# Patient Record
Sex: Male | Born: 1955
Health system: Southern US, Community
[De-identification: ages and names within clinical notes are randomized; demographics above are authoritative.]

## PROBLEM LIST (undated history)

## (undated) DIAGNOSIS — N189 Chronic kidney disease, unspecified: Secondary | ICD-10-CM

## (undated) DIAGNOSIS — E785 Hyperlipidemia, unspecified: Secondary | ICD-10-CM

## (undated) DIAGNOSIS — I10 Essential (primary) hypertension: Secondary | ICD-10-CM

## (undated) DIAGNOSIS — Z992 Dependence on renal dialysis: Secondary | ICD-10-CM

## (undated) DIAGNOSIS — I509 Heart failure, unspecified: Secondary | ICD-10-CM

## (undated) DIAGNOSIS — R931 Abnormal findings on diagnostic imaging of heart and coronary circulation: Secondary | ICD-10-CM

## (undated) DIAGNOSIS — F419 Anxiety disorder, unspecified: Secondary | ICD-10-CM

## (undated) DIAGNOSIS — Z8614 Personal history of Methicillin resistant Staphylococcus aureus infection: Secondary | ICD-10-CM

## (undated) DIAGNOSIS — T4145XA Adverse effect of unspecified anesthetic, initial encounter: Secondary | ICD-10-CM

## (undated) DIAGNOSIS — I340 Nonrheumatic mitral (valve) insufficiency: Secondary | ICD-10-CM

## (undated) DIAGNOSIS — T7840XA Allergy, unspecified, initial encounter: Secondary | ICD-10-CM

## (undated) DIAGNOSIS — F32A Depression, unspecified: Secondary | ICD-10-CM

## (undated) DIAGNOSIS — E1121 Type 2 diabetes mellitus with diabetic nephropathy: Secondary | ICD-10-CM

## (undated) DIAGNOSIS — F329 Major depressive disorder, single episode, unspecified: Secondary | ICD-10-CM

## (undated) DIAGNOSIS — M1990 Unspecified osteoarthritis, unspecified site: Secondary | ICD-10-CM

## (undated) DIAGNOSIS — T8859XA Other complications of anesthesia, initial encounter: Secondary | ICD-10-CM

## (undated) DIAGNOSIS — M109 Gout, unspecified: Secondary | ICD-10-CM

## (undated) DIAGNOSIS — N529 Male erectile dysfunction, unspecified: Secondary | ICD-10-CM

## (undated) DIAGNOSIS — J45909 Unspecified asthma, uncomplicated: Secondary | ICD-10-CM

## (undated) DIAGNOSIS — Z972 Presence of dental prosthetic device (complete) (partial): Secondary | ICD-10-CM

## (undated) DIAGNOSIS — IMO0001 Reserved for inherently not codable concepts without codable children: Secondary | ICD-10-CM

## (undated) DIAGNOSIS — D649 Anemia, unspecified: Secondary | ICD-10-CM

## (undated) HISTORY — DX: Male erectile dysfunction, unspecified: N52.9

## (undated) HISTORY — DX: Hyperlipidemia, unspecified: E78.5

## (undated) HISTORY — DX: Reserved for inherently not codable concepts without codable children: IMO0001

## (undated) HISTORY — DX: Essential (primary) hypertension: I10

## (undated) HISTORY — DX: Heart failure, unspecified: I50.9

## (undated) HISTORY — DX: Allergy, unspecified, initial encounter: T78.40XA

## (undated) HISTORY — PX: EYE SURGERY: SHX253

## (undated) HISTORY — PX: HERNIA REPAIR: SHX51

## (undated) HISTORY — PX: APPENDECTOMY: SHX54

---

## 2005-10-22 LAB — HM COLONOSCOPY

## 2006-10-18 ENCOUNTER — Encounter: Payer: Self-pay | Admitting: Internal Medicine

## 2007-06-20 ENCOUNTER — Ambulatory Visit: Payer: Self-pay | Admitting: Internal Medicine

## 2007-06-20 DIAGNOSIS — J309 Allergic rhinitis, unspecified: Secondary | ICD-10-CM | POA: Insufficient documentation

## 2007-06-20 DIAGNOSIS — I10 Essential (primary) hypertension: Secondary | ICD-10-CM | POA: Insufficient documentation

## 2007-06-20 DIAGNOSIS — G43009 Migraine without aura, not intractable, without status migrainosus: Secondary | ICD-10-CM | POA: Insufficient documentation

## 2007-06-20 DIAGNOSIS — E785 Hyperlipidemia, unspecified: Secondary | ICD-10-CM | POA: Insufficient documentation

## 2007-06-24 LAB — CONVERTED CEMR LAB
ALT: 24 units/L (ref 0–53)
Albumin: 3.3 g/dL — ABNORMAL LOW (ref 3.5–5.2)
BUN: 26 mg/dL — ABNORMAL HIGH (ref 6–23)
Creatinine,U: 138.5 mg/dL
Eosinophils Absolute: 0.1 10*3/uL (ref 0.0–0.6)
GFR calc non Af Amer: 67 mL/min
HDL: 45.4 mg/dL (ref >39.0)
Lymphocytes Relative: 36.1 % (ref 12.0–46.0)
MCV: 84 fL (ref 78.0–100.0)
Monocytes Relative: 12.7 % — ABNORMAL HIGH (ref 3.0–11.0)
Neutro Abs: 3.3 10*3/uL (ref 1.4–7.7)
Phosphorus: 4.3 mg/dL (ref 2.3–4.6)
Platelets: 161 10*3/uL (ref 150–400)
Potassium: 4.2 meq/L (ref 3.5–5.1)
Triglycerides: 75 mg/dL (ref 0–149)

## 2007-06-26 ENCOUNTER — Telehealth (INDEPENDENT_AMBULATORY_CARE_PROVIDER_SITE_OTHER): Payer: Self-pay | Admitting: *Deleted

## 2007-06-27 ENCOUNTER — Telehealth (INDEPENDENT_AMBULATORY_CARE_PROVIDER_SITE_OTHER): Payer: Self-pay | Admitting: *Deleted

## 2007-07-03 ENCOUNTER — Encounter: Payer: Self-pay | Admitting: Internal Medicine

## 2007-07-07 ENCOUNTER — Ambulatory Visit: Payer: Self-pay | Admitting: Internal Medicine

## 2007-07-07 DIAGNOSIS — S335XXA Sprain of ligaments of lumbar spine, initial encounter: Secondary | ICD-10-CM | POA: Insufficient documentation

## 2007-07-24 ENCOUNTER — Ambulatory Visit: Payer: Self-pay | Admitting: Internal Medicine

## 2007-09-29 ENCOUNTER — Ambulatory Visit: Payer: Self-pay | Admitting: Internal Medicine

## 2007-09-29 DIAGNOSIS — E1129 Type 2 diabetes mellitus with other diabetic kidney complication: Secondary | ICD-10-CM | POA: Insufficient documentation

## 2007-09-30 LAB — CONVERTED CEMR LAB
CO2: 25 meq/L (ref 19–32)
Chloride: 99 meq/L (ref 96–112)
GFR calc Af Amer: 74 mL/min
Glucose, Bld: 153 mg/dL — ABNORMAL HIGH (ref 70–99)
HDL: 45.9 mg/dL (ref >39.0)
Sodium: 133 meq/L — ABNORMAL LOW (ref 135–145)
Triglycerides: 115 mg/dL (ref 0–149)
VLDL: 23 mg/dL (ref 0–40)

## 2007-10-01 ENCOUNTER — Telehealth: Payer: Self-pay | Admitting: Internal Medicine

## 2008-01-28 ENCOUNTER — Ambulatory Visit: Payer: Self-pay | Admitting: Internal Medicine

## 2008-01-29 LAB — CONVERTED CEMR LAB: Hgb A1c MFr Bld: 9.1 % — ABNORMAL HIGH (ref 4.6–6.0)

## 2008-02-09 ENCOUNTER — Ambulatory Visit: Payer: Self-pay | Admitting: Internal Medicine

## 2008-02-09 DIAGNOSIS — N529 Male erectile dysfunction, unspecified: Secondary | ICD-10-CM | POA: Insufficient documentation

## 2008-02-09 LAB — CONVERTED CEMR LAB
Blood in Urine, dipstick: NEGATIVE
Glucose, Urine, Semiquant: NEGATIVE
Ketones, urine, test strip: NEGATIVE
Urobilinogen, UA: 0.2
WBC Urine, dipstick: NEGATIVE

## 2008-02-10 LAB — CONVERTED CEMR LAB
ALT: 27 units/L (ref 0–53)
AST: 21 units/L (ref 0–37)
Albumin: 3.3 g/dL — ABNORMAL LOW (ref 3.5–5.2)
Alkaline Phosphatase: 55 units/L (ref 39–117)
Basophils Relative: 0.1 % (ref 0.0–1.0)
Calcium: 9.3 mg/dL (ref 8.4–10.5)
Creatinine, Ser: 1.2 mg/dL (ref 0.4–1.5)
Eosinophils Relative: 2.7 % (ref 0.0–5.0)
GFR calc non Af Amer: 67 mL/min
Hemoglobin: 12.8 g/dL — ABNORMAL LOW (ref 13.0–17.0)
Lymphocytes Relative: 34.6 % (ref 12.0–46.0)
Monocytes Relative: 10.9 % (ref 3.0–12.0)
Neutro Abs: 2.6 10*3/uL (ref 1.4–7.7)
Neutrophils Relative %: 51.7 % (ref 43.0–77.0)
Phosphorus: 3.7 mg/dL (ref 2.3–4.6)
Prolactin: 4.9 ng/mL
RBC: 4.56 M/uL (ref 4.22–5.81)
Sex Hormone Binding: 15 nmol/L (ref 13–71)
Sodium: 142 meq/L (ref 135–145)
Testosterone-% Free: 2.8 % (ref 1.6–2.9)
Testosterone: 213.24 ng/dL — ABNORMAL LOW (ref 350–890)
WBC: 5.1 10*3/uL (ref 4.5–10.5)

## 2008-04-30 ENCOUNTER — Encounter (INDEPENDENT_AMBULATORY_CARE_PROVIDER_SITE_OTHER): Payer: Self-pay | Admitting: *Deleted

## 2008-04-30 ENCOUNTER — Ambulatory Visit: Payer: Self-pay | Admitting: Family Medicine

## 2008-04-30 DIAGNOSIS — R42 Dizziness and giddiness: Secondary | ICD-10-CM | POA: Insufficient documentation

## 2008-04-30 LAB — CONVERTED CEMR LAB
BUN: 24 mg/dL — ABNORMAL HIGH (ref 6–23)
Blood Glucose, Fingerstick: 173
CO2: 28 meq/L (ref 19–32)
Calcium: 9.3 mg/dL (ref 8.4–10.5)
Chloride: 106 meq/L (ref 96–112)
Eosinophils Relative: 2 % (ref 0.0–5.0)
GFR calc non Af Amer: 61 mL/min
Glucose, Bld: 169 mg/dL — ABNORMAL HIGH (ref 70–99)
HCT: 38.7 % — ABNORMAL LOW (ref 39.0–52.0)
Lymphocytes Relative: 37.6 % (ref 12.0–46.0)
Monocytes Absolute: 0.6 10*3/uL (ref 0.1–1.0)
Monocytes Relative: 9.7 % (ref 3.0–12.0)
Platelets: 212 10*3/uL (ref 150–400)
Potassium: 4.5 meq/L (ref 3.5–5.1)
RDW: 13.3 % (ref 11.5–14.6)
Sodium: 140 meq/L (ref 135–145)
WBC: 6.4 10*3/uL (ref 4.5–10.5)

## 2008-06-16 ENCOUNTER — Ambulatory Visit: Payer: Self-pay | Admitting: Internal Medicine

## 2008-06-17 LAB — CONVERTED CEMR LAB: Hgb A1c MFr Bld: 8.8 % — ABNORMAL HIGH (ref 4.6–6.0)

## 2008-07-26 ENCOUNTER — Ambulatory Visit: Payer: Self-pay | Admitting: Internal Medicine

## 2008-07-28 ENCOUNTER — Ambulatory Visit: Payer: Self-pay | Admitting: Family Medicine

## 2008-07-28 DIAGNOSIS — R195 Other fecal abnormalities: Secondary | ICD-10-CM | POA: Insufficient documentation

## 2008-07-29 LAB — CONVERTED CEMR LAB
Lymphocytes Relative: 35.2 % (ref 12.0–46.0)
Monocytes Relative: 11.8 % (ref 3.0–12.0)
Platelets: 198 10*3/uL (ref 150–400)
RDW: 13 % (ref 11.5–14.6)
TSH: 0.87 microintl units/mL (ref 0.35–5.50)

## 2008-08-18 ENCOUNTER — Ambulatory Visit: Payer: Self-pay | Admitting: Family Medicine

## 2008-12-21 ENCOUNTER — Ambulatory Visit: Payer: Self-pay | Admitting: Internal Medicine

## 2008-12-21 DIAGNOSIS — D649 Anemia, unspecified: Secondary | ICD-10-CM | POA: Insufficient documentation

## 2008-12-22 LAB — CONVERTED CEMR LAB
Albumin: 3.1 g/dL — ABNORMAL LOW (ref 3.5–5.2)
Alkaline Phosphatase: 51 units/L (ref 39–117)
Basophils Absolute: 0 10*3/uL (ref 0.0–0.1)
Basophils Relative: 0.3 % (ref 0.0–3.0)
CO2: 28 meq/L (ref 19–32)
Chloride: 105 meq/L (ref 96–112)
Cholesterol: 153 mg/dL (ref 0–200)
Creatinine, Ser: 1.1 mg/dL (ref 0.4–1.5)
Eosinophils Absolute: 0.1 10*3/uL (ref 0.0–0.7)
GFR calc non Af Amer: 74 mL/min
HDL: 49.1 mg/dL (ref >39.0)
Hgb A1c MFr Bld: 8.4 % — ABNORMAL HIGH (ref 4.6–6.0)
MCHC: 33.2 g/dL (ref 30.0–36.0)
MCV: 86.1 fL (ref 78.0–100.0)
Monocytes Absolute: 0.6 10*3/uL (ref 0.1–1.0)
Neutrophils Relative %: 48.5 % (ref 43.0–77.0)
Platelets: 153 10*3/uL (ref 150–400)
Potassium: 4.7 meq/L (ref 3.5–5.1)
RBC: 4.3 M/uL (ref 4.22–5.81)
VLDL: 17 mg/dL (ref 0–40)

## 2009-06-28 ENCOUNTER — Ambulatory Visit: Payer: Self-pay | Admitting: Internal Medicine

## 2009-06-28 DIAGNOSIS — M549 Dorsalgia, unspecified: Secondary | ICD-10-CM | POA: Insufficient documentation

## 2009-06-29 LAB — CONVERTED CEMR LAB
ALT: 20 units/L (ref 0–53)
Alkaline Phosphatase: 56 units/L (ref 39–117)
BUN: 34 mg/dL — ABNORMAL HIGH (ref 6–23)
Bilirubin, Direct: 0 mg/dL (ref 0.0–0.3)
CO2: 27 meq/L (ref 19–32)
Cholesterol: 140 mg/dL (ref 0–200)
Creatinine, Ser: 1.3 mg/dL (ref 0.4–1.5)
Creatinine,U: 115.2 mg/dL
Eosinophils Absolute: 0.2 10*3/uL (ref 0.0–0.7)
Eosinophils Relative: 2.9 % (ref 0.0–5.0)
Glucose, Bld: 162 mg/dL — ABNORMAL HIGH (ref 70–99)
Hgb A1c MFr Bld: 7 % — ABNORMAL HIGH (ref 4.6–6.5)
Lymphocytes Relative: 34.6 % (ref 12.0–46.0)
MCV: 86.7 fL (ref 78.0–100.0)
Microalb Creat Ratio: 70.3 mg/g — ABNORMAL HIGH (ref 0.0–30.0)
Microalb, Ur: 8.1 mg/dL — ABNORMAL HIGH (ref 0.0–1.9)
Monocytes Absolute: 0.6 10*3/uL (ref 0.1–1.0)
Neutrophils Relative %: 51.3 % (ref 43.0–77.0)
PSA: 0.44 ng/mL (ref 0.10–4.00)
Platelets: 169 10*3/uL (ref 150.0–400.0)
Sodium: 141 meq/L (ref 135–145)
TSH: 1.25 microintl units/mL (ref 0.35–5.50)
Total Protein: 6.7 g/dL (ref 6.0–8.3)
Triglycerides: 80 mg/dL (ref 0.0–149.0)
VLDL: 16 mg/dL (ref 0.0–40.0)
WBC: 5.8 10*3/uL (ref 4.5–10.5)

## 2009-07-18 ENCOUNTER — Telehealth: Payer: Self-pay | Admitting: Internal Medicine

## 2009-08-25 ENCOUNTER — Ambulatory Visit: Payer: Self-pay | Admitting: Internal Medicine

## 2009-09-22 ENCOUNTER — Encounter: Payer: Self-pay | Admitting: Internal Medicine

## 2009-10-05 ENCOUNTER — Ambulatory Visit: Payer: Self-pay | Admitting: Internal Medicine

## 2009-10-05 DIAGNOSIS — M79609 Pain in unspecified limb: Secondary | ICD-10-CM | POA: Insufficient documentation

## 2009-10-18 LAB — CONVERTED CEMR LAB
BUN: 18 mg/dL (ref 6–23)
Creatinine, Ser: 1.1 mg/dL (ref 0.4–1.5)
GFR calc non Af Amer: 89.14 mL/min (ref >60)
Phosphorus: 4.4 mg/dL (ref 2.3–4.6)
Sodium: 140 meq/L (ref 135–145)

## 2009-11-08 ENCOUNTER — Encounter: Payer: Self-pay | Admitting: Internal Medicine

## 2009-11-09 ENCOUNTER — Ambulatory Visit: Payer: Self-pay | Admitting: Internal Medicine

## 2009-11-10 LAB — CONVERTED CEMR LAB
Albumin: 2.9 g/dL — ABNORMAL LOW (ref 3.5–5.2)
Alkaline Phosphatase: 53 units/L (ref 39–117)
BUN: 16 mg/dL (ref 6–23)
Basophils Absolute: 0.1 10*3/uL (ref 0.0–0.1)
Creatinine, Ser: 1.2 mg/dL (ref 0.4–1.5)
Eosinophils Absolute: 0.1 10*3/uL (ref 0.0–0.7)
Glucose, Bld: 89 mg/dL (ref 70–99)
Lymphocytes Relative: 33.4 % (ref 12.0–46.0)
MCHC: 32 g/dL (ref 30.0–36.0)
Neutro Abs: 3 10*3/uL (ref 1.4–7.7)
Neutrophils Relative %: 53.6 % (ref 43.0–77.0)
Phosphorus: 4 mg/dL (ref 2.3–4.6)
Platelets: 156 10*3/uL (ref 150.0–400.0)
RDW: 14 % (ref 11.5–14.6)
TSH: 1.07 microintl units/mL (ref 0.35–5.50)

## 2009-11-11 ENCOUNTER — Telehealth: Payer: Self-pay | Admitting: Internal Medicine

## 2009-11-18 ENCOUNTER — Encounter: Payer: Self-pay | Admitting: Internal Medicine

## 2009-11-18 ENCOUNTER — Ambulatory Visit: Payer: Self-pay

## 2009-11-18 ENCOUNTER — Ambulatory Visit (HOSPITAL_COMMUNITY): Admission: RE | Admit: 2009-11-18 | Discharge: 2009-11-18 | Payer: Self-pay | Admitting: Internal Medicine

## 2009-11-18 ENCOUNTER — Ambulatory Visit: Payer: Self-pay | Admitting: Internal Medicine

## 2009-11-18 DIAGNOSIS — I5032 Chronic diastolic (congestive) heart failure: Secondary | ICD-10-CM | POA: Insufficient documentation

## 2009-11-24 ENCOUNTER — Ambulatory Visit: Payer: Self-pay | Admitting: Internal Medicine

## 2009-11-28 LAB — CONVERTED CEMR LAB
Albumin: 3.4 g/dL — ABNORMAL LOW (ref 3.5–5.2)
Chloride: 108 meq/L (ref 96–112)
GFR calc non Af Amer: 67.45 mL/min (ref >60)
Phosphorus: 4.4 mg/dL (ref 2.3–4.6)
Potassium: 4.2 meq/L (ref 3.5–5.1)

## 2009-12-06 ENCOUNTER — Ambulatory Visit: Payer: Self-pay | Admitting: Cardiology

## 2009-12-06 ENCOUNTER — Ambulatory Visit: Payer: Self-pay

## 2009-12-06 ENCOUNTER — Ambulatory Visit (HOSPITAL_COMMUNITY): Admission: RE | Admit: 2009-12-06 | Discharge: 2009-12-06 | Payer: Self-pay | Admitting: Internal Medicine

## 2009-12-06 ENCOUNTER — Encounter: Payer: Self-pay | Admitting: Internal Medicine

## 2010-01-06 ENCOUNTER — Ambulatory Visit: Payer: Self-pay | Admitting: Internal Medicine

## 2010-01-06 DIAGNOSIS — M109 Gout, unspecified: Secondary | ICD-10-CM | POA: Insufficient documentation

## 2010-01-09 LAB — CONVERTED CEMR LAB
ALT: 19 units/L (ref 0–53)
AST: 20 units/L (ref 0–37)
CO2: 28 meq/L (ref 19–32)
Cholesterol: 145 mg/dL (ref 0–200)
Creatinine, Ser: 1.1 mg/dL (ref 0.4–1.5)
Eosinophils Relative: 3.3 % (ref 0.0–5.0)
GFR calc non Af Amer: 89.05 mL/min (ref >60)
Glucose, Bld: 88 mg/dL (ref 70–99)
HDL: 63.4 mg/dL (ref >39.00)
Hgb A1c MFr Bld: 6.9 % — ABNORMAL HIGH (ref 4.6–6.5)
Lymphocytes Relative: 34.6 % (ref 12.0–46.0)
Monocytes Relative: 11.4 % (ref 3.0–12.0)
Neutrophils Relative %: 50.2 % (ref 43.0–77.0)
Phosphorus: 4 mg/dL (ref 2.3–4.6)
Platelets: 178 10*3/uL (ref 150.0–400.0)
Sodium: 140 meq/L (ref 135–145)
TSH: 0.88 microintl units/mL (ref 0.35–5.50)
Total Protein: 6.8 g/dL (ref 6.0–8.3)
VLDL: 20.8 mg/dL (ref 0.0–40.0)
WBC: 5.1 10*3/uL (ref 4.5–10.5)

## 2010-01-24 ENCOUNTER — Ambulatory Visit: Payer: Self-pay | Admitting: Internal Medicine

## 2010-01-24 LAB — CONVERTED CEMR LAB
Bilirubin Urine: NEGATIVE
Ketones, urine, test strip: NEGATIVE
Nitrite: NEGATIVE
Specific Gravity, Urine: 1.015
pH: 6

## 2010-02-28 ENCOUNTER — Encounter (INDEPENDENT_AMBULATORY_CARE_PROVIDER_SITE_OTHER): Payer: Self-pay | Admitting: *Deleted

## 2010-05-16 ENCOUNTER — Telehealth: Payer: Self-pay | Admitting: Internal Medicine

## 2010-06-19 ENCOUNTER — Ambulatory Visit: Payer: Self-pay | Admitting: Internal Medicine

## 2010-06-20 LAB — CONVERTED CEMR LAB
AST: 19 units/L (ref 0–37)
Albumin: 3.4 g/dL — ABNORMAL LOW (ref 3.5–5.2)
Alkaline Phosphatase: 57 units/L (ref 39–117)
Bilirubin, Direct: 0.1 mg/dL (ref 0.0–0.3)
Cholesterol: 173 mg/dL (ref 0–200)
Total CHOL/HDL Ratio: 3
Triglycerides: 71 mg/dL (ref 0.0–149.0)

## 2010-07-03 ENCOUNTER — Ambulatory Visit: Payer: Self-pay | Admitting: Internal Medicine

## 2010-07-05 LAB — CONVERTED CEMR LAB
Albumin: 3.3 g/dL — ABNORMAL LOW (ref 3.5–5.2)
BUN: 28 mg/dL — ABNORMAL HIGH (ref 6–23)
CO2: 26 meq/L (ref 19–32)
Calcium: 9 mg/dL (ref 8.4–10.5)
Chloride: 110 meq/L (ref 96–112)
Creatinine, Ser: 1.5 mg/dL (ref 0.4–1.5)
GFR calc non Af Amer: 64.63 mL/min (ref >60)

## 2010-11-17 ENCOUNTER — Ambulatory Visit
Admission: RE | Admit: 2010-11-17 | Discharge: 2010-11-17 | Payer: Self-pay | Source: Home / Self Care | Attending: Internal Medicine | Admitting: Internal Medicine

## 2010-11-17 DIAGNOSIS — M25569 Pain in unspecified knee: Secondary | ICD-10-CM | POA: Insufficient documentation

## 2010-11-22 NOTE — Progress Notes (Signed)
Summary: regarding a possible drug interaction  Phone Note From Pharmacy   Caller: [place] [phone] Summary of Call: Pharmacy called regarding a possible drug interaction between simvastatin and amlodipine.  Please advise. Initial call taken by: Marty Heck CMA,  May 16, 2010 11:04 AM  Follow-up for Phone Call        Yes  Please have him stop the simvastatin and change him to pravastatin 40mg  daily. Rx x 1 year Please call him and let him know there is a potential interaction between these 2 so I am changing my patients to another cholesterol med  Have him come in for blood work in 4-6 weeks (lipid and hepatic) Follow-up by: Claris Gower MD,  May 16, 2010 1:43 PM  Additional Follow-up for Phone Call Additional follow up Details #1::        Patient notified as instructed via telephone.  Rx sent in for Pravastatin 40 to [facility].  Lab appt made. Additional Follow-up by: Sherrian Divers CMA Deborra Medina),  May 16, 2010 2:46 PM    New/Updated Medications: PRAVASTATIN SODIUM 40 MG TABS (PRAVASTATIN SODIUM) take one tablet by mouth daily Prescriptions: PRAVASTATIN SODIUM 40 MG TABS (PRAVASTATIN SODIUM) take one tablet by mouth daily  #30 x 11   Entered by:   Sherrian Divers CMA ([place])   Authorized by:   Claris Gower MD   Signed by:   Sherrian Divers CMA ([place]) on 05/16/2010   Method used:   Electronically to        [facility] [phone]* (retail)       [address]       [place],   [address]       Ph: [phone] or [phone]       Fax: [phone]   RxIDAR:[id number]   Current Allergies (reviewed today): ! ASA ! * KIDNEY DYE

## 2010-11-22 NOTE — Miscellaneous (Signed)
Summary: med list update- colcrys  Clinical Lists Changes  Medications: Changed medication from COLCHICINE 0.6 MG TABS (COLCHICINE) 1 up to three times a day as needed for foot pain from gout to COLCRYS 0.6 MG TABS (COLCHICINE) take one up to three times a day for pain from gout     Prior Medications: FEXOFENADINE HCL 180 MG  TABS (FEXOFENADINE HCL) Take 1 tablet by mouth once a day GLYBURIDE 5 MG  TABS (GLYBURIDE) Take 1 tablet by mouth two times a day RHINOCORT AQUA 32 MCG/ACT  SUSP (BUDESONIDE (NASAL)) 1 spray ea. nostril daily AMLODIPINE BESY-BENAZEPRIL HCL 10-20 MG  CAPS (AMLODIPINE BESY-BENAZEPRIL HCL) Take 1 tablet by mouth once a day ACTOS 45 MG  TABS (PIOGLITAZONE HCL) 1 daily----- Is now off avandia SIMVASTATIN 40 MG  TABS (SIMVASTATIN) Take 1 tablet by mouth once a day MENS MULTIVITAMIN PLUS   TABS (MULTIPLE VITAMINS-MINERALS) Take 1 tablet by mouth once a day CARISOPRODOL 350 MG TABS (CARISOPRODOL) 1/2-1 tab by mouth three times a day as needed for muscle spasm LOSARTAN POTASSIUM 100 MG TABS (LOSARTAN POTASSIUM) 1 tab daily METFORMIN HCL 500 MG XR24H-TAB (METFORMIN HCL) 4 tabs in AM FUROSEMIDE 40 MG TABS (FUROSEMIDE) 1 tab  daily as directed to reduce swelling. If increased swelling, use 2 tabs POTASSIUM CHLORIDE 20 MEQ PACK (POTASSIUM CHLORIDE) take 1 by mouth once daily with the furosemide LEVITRA 20 MG TABS (VARDENAFIL HCL) take 1 tab about 30-60 minutes before sex Current Allergies: ! ASA ! * KIDNEY DYE

## 2010-11-22 NOTE — Assessment & Plan Note (Signed)
Summary: 2 WEEK FOLLOW UP/[facility]   Vital Signs:  Patient profile:   55 year old male Weight:      278 pounds Temp:     98.5 degrees F oral Pulse rate:   60 / minute Pulse rhythm:   irregular BP sitting:   160 / 70  (left arm) Cuff size:   large  Vitals Entered By: Edwin Dada CMA Deborra Medina) (November 24, 2009 2:59 PM) CC: 2 week follow-up   History of Present Illness: Feels better now as of this week Swelling is basically gone Not waking up with the tired feeling back to working without a problem No edema even at the end of the day  Has cut out all salt  weight down 13#  Allergies: 1)  ! Asa 2)  ! * Kidney Dye  Past History:  Past medical, surgical, family and social histories (including risk factors) reviewed for relevance to current acute and chronic problems.  Past Medical History: Allergic rhinitis Diabetes mellitus, type II Hyperlipidemia Hypertension Migraine Erectile dysfunction CHF--diastolic  Past Surgical History: Reviewed history from 06/20/2007 and no changes required. LIH  0000000 Umbilical hernia repair [id number]  Family History: Reviewed history from 06/20/2007 and no changes required. Mom died from DM complications in [id number] Dad died of prostate cancer in 84's.  Had brain tumor earlier 02-Jan-2023 siblings--3 have died (2 were homicide, 1 had seizures) DM strong in family  CAD in 1 brother HTN in family, esp Mom's side No colon cancer  Social History: Reviewed history from 06/20/2007 and no changes required. 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   Neck:  supple and no masses.   Lungs:  normal respiratory effort and normal breath sounds.   Heart:  normal rate, regular rhythm, no murmur, and no gallop.   Freq ectopy Extremities:  edema gone   Impression & Recommendations:  Problem # 1:  CHRONIC DIASTOLIC HEART FAILURE (0000000) Assessment New  echo reviewed getting repeat contrast study will decrease furosemide unless edema worse okay to increase graded exercise  His updated medication list for this problem includes:    Amlodipine Besy-benazepril Hcl 10-20 Mg Caps (Amlodipine besy-benazepril hcl) .[name]... Take 1 tablet by mouth once a day    Losartan Potassium 100 Mg Tabs (Losartan potassium) .[name]... 1 tab daily    Furosemide 40 Mg Tabs (Furosemide) .[name]... 1 tab  daily as directed to reduce swelling. if increased swelling, use 2 tabs  Orders: Venipuncture [id number]) TLB-Renal Function Panel (80069-RENAL)  Complete Medication List: 1)  Fexofenadine Hcl 180 Mg Tabs (Fexofenadine hcl) .... Take 1 tablet by mouth once a day 2)  Glyburide 5 Mg Tabs (Glyburide) .... Take 1 tablet by mouth two times a day 3)  Rhinocort Aqua 32 Mcg/act Susp (Budesonide (nasal)) .[name].. 1 spray ea. nostril daily 4)  Amlodipine Besy-benazepril Hcl 10-20 Mg Caps (Amlodipine besy-benazepril hcl) .... Take 1 tablet by mouth once a day 5)  Actos 45 Mg Tabs (Pioglitazone hcl) .[name].. 1 daily----- is now off avandia 6)  Simvastatin 40 Mg Tabs (Simvastatin) .... Take 1 tablet by mouth once a day 7)  Mens Multivitamin Plus Tabs (Multiple vitamins-minerals) .... Take 1 tablet by mouth once a day 8)  Carisoprodol 350 Mg Tabs (Carisoprodol) .Marland KitchenMarland KitchenMarland Kitchen  1/2-1 tab by mouth three times a day as needed for muscle spasm 9)  Colchicine 0.6 Mg Tabs (Colchicine) .[name].. 1 up to three times a day as needed for foot pain from gout 10)  Losartan Potassium 100 Mg Tabs (Losartan potassium) .[name].. 1 tab daily 11)  Metformin Hcl 500 Mg Xr24h-tab (Metformin hcl) .... 4 tabs in am 12)  Furosemide 40 Mg Tabs (Furosemide) .[name].. 1 tab  daily as directed to reduce swelling. if increased swelling, use 2 tabs 13)  Potassium Chloride 20 Meq Pack (Potassium chloride)  .... Take 1 by mouth once daily with the furosemide  Patient Instructions: 1)  Keep appt on March 18th  Current Allergies (reviewed today): ! ASA ! * KIDNEY DYE

## 2010-11-22 NOTE — Assessment & Plan Note (Signed)
Summary: ROA  6 MTHS CYD R/S FROM 12/28/09   Vital Signs:  Patient profile:   55 year old male Weight:      278 pounds Temp:     98.3 degrees F oral Pulse rate:   60 / minute Pulse rhythm:   regular BP sitting:   150 / 60  (left arm) Cuff size:   large  Vitals Entered By: Edwin Dada CMA ([place]) (January 06, 2010 9:30 AM) CC: 6 month follow-up   History of Present Illness: Swelling is still better No problems with the swelling Breathing has been fine  has had an occ flare with his gout keeps under control with daily colchicine  Checks sugars 2-3 times per week Mostly under <120 fasting No hypoglycemic reactions--gets hungry  No chest pain  no palpitations  Allergies: 1)  ! Asa 2)  ! * Kidney Dye  Past History:  Past medical, surgical, family and social histories (including risk factors) reviewed for relevance to current acute and chronic problems.  Past Medical History: Allergic rhinitis Diabetes mellitus, type II Hyperlipidemia Hypertension Migraine Erectile dysfunction CHF--diastolic Gout  Past Surgical History: Reviewed history from 06/20/2007 and no changes required. LIH  0000000 Umbilical hernia repair [id number]  Family History: Reviewed history from 06/20/2007 and no changes required. Mom died from DM complications in [id number] Dad died of prostate cancer in 39's.  Had brain tumor earlier January 19, 2023 siblings--3 have died (2 were homicide, 1 had seizures) DM strong in family  CAD in 1 brother HTN in family, esp Mom's side No colon cancer  Social History: Reviewed history from 06/20/2007 and no changes required. 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   Neck:  supple, no masses, no thyromegaly, no carotid bruits, and no cervical lymphadenopathy.    Lungs:  normal respiratory effort and normal breath sounds.   Heart:  normal rate, regular rhythm, no murmur, and no gallop.   Msk:  no joint tenderness and no joint swelling.   Skin:  no suspicious lesions and no ulcerations.   Psych:  normally interactive, good eye contact, not anxious appearing, and not depressed appearing.    Diabetes Management Exam:    Foot Exam (with socks and/or shoes not present):       Sensory-Pinprick/Light touch:          Left medial foot (L-4): normal          Left dorsal foot (L-5): normal          Left lateral foot (S-1): normal          Right medial foot (L-4): normal          Right dorsal foot (L-5): normal          Right lateral foot (S-1): normal       Inspection:          Left foot: normal          Right foot: normal       Nails:          Left foot: normal          Right foot: normal   Impression & Recommendations:  Problem # 1:  CHRONIC DIASTOLIC HEART FAILURE (0000000) Assessment Unchanged stable now careful with diet now no changes needed  His updated medication list for this problem includes:    Amlodipine Besy-benazepril Hcl 10-20 Mg Caps (Amlodipine besy-benazepril hcl) .[name]... Take 1 tablet by mouth once a day    Losartan Potassium 100 Mg Tabs (Losartan potassium) .[name]... 1 tab daily    Furosemide 40 Mg Tabs (Furosemide) .[name]... 1 tab  daily as directed to reduce swelling. if increased swelling, use 2 tabs  Problem # 2:  DIABETES MELLITUS, TYPE II (ICD-250.00) Assessment: Unchanged  seems to be okay will recheck labs  His updated medication list for this problem includes:    Glyburide 5 Mg Tabs (Glyburide) .[name]... Take 1 tablet by mouth two times a day    Amlodipine Besy-benazepril Hcl 10-20 Mg Caps (Amlodipine besy-benazepril hcl) .[name]... Take 1 tablet by mouth once a day    Actos 45 Mg Tabs (Pioglitazone hcl) .[name]... 1 daily----- is now off avandia    Losartan Potassium 100 Mg Tabs (Losartan potassium) .[name]... 1 tab daily     Metformin Hcl 500 Mg Xr24h-tab (Metformin hcl) .Marland KitchenMarland KitchenMarland KitchenMarland Kitchen 4 tabs in am  Labs Reviewed: Creat: 1.4 (11/24/2009)     Last Eye Exam: Mild non-proliferative diabetic retinopathy.    (09/22/2009) Reviewed HgBA1c results: 7.0 (06/28/2009)  8.4 (12/21/2008)  Orders: TLB-A1C / Hgb A1C (Glycohemoglobin) (83036-A1C)  Problem # 3:  GOUT (ICD-274.9) Assessment: Unchanged  fine on daily prophylaxis consider change to allopurinol due to price of colcrys  His updated medication list for this problem includes:    Colchicine 0.6 Mg Tabs (Colchicine) .[name]... 1 up to three times a day as needed for foot pain from gout  Orders: TLB-Uric Acid, Blood (84550-URIC)  Problem # 4:  HYPERTENSION (ICD-401.9) Assessment: Unchanged  fair control no changes  His updated medication list for this problem includes:    Amlodipine Besy-benazepril Hcl 10-20 Mg Caps (Amlodipine besy-benazepril hcl) .[name]... Take 1 tablet by mouth once a day    Losartan Potassium 100 Mg Tabs (Losartan potassium) .[name]... 1 tab daily    Furosemide 40 Mg Tabs (Furosemide) .[name]... 1 tab  daily as directed to reduce swelling. if increased swelling, use 2 tabs  BP today: 150/60 Prior BP: 160/70 (11/24/2009)  Labs Reviewed: K+: 4.2 (11/24/2009) Creat: : 1.4 (11/24/2009)   Chol: 140 (06/28/2009)   HDL: 57.60 (06/28/2009)   LDL: 66 (06/28/2009)   TG: 80.0 (06/28/2009)  Orders: TLB-Renal Function Panel (80069-RENAL) TLB-CBC Platelet - w/Differential (85025-CBCD) TLB-TSH (Thyroid Stimulating Hormone) (84443-TSH)  Complete Medication List: 1)  Fexofenadine Hcl 180 Mg Tabs (Fexofenadine hcl) .... Take 1 tablet by mouth once a day 2)  Glyburide 5 Mg Tabs (Glyburide) .... Take 1 tablet by mouth two times a day 3)  Rhinocort Aqua 32 Mcg/act Susp (Budesonide (nasal)) .[name].. 1 spray ea. nostril daily 4)  Amlodipine Besy-benazepril Hcl 10-20 Mg Caps (Amlodipine besy-benazepril hcl) .... Take 1 tablet by mouth once a day 5)  Actos 45 Mg Tabs  (Pioglitazone hcl) .[name].. 1 daily----- is now off avandia 6)  Simvastatin 40 Mg Tabs (Simvastatin) .... Take 1 tablet by mouth once a day 7)  Mens Multivitamin Plus Tabs (Multiple vitamins-minerals) .... Take 1 tablet by mouth once a day 8)  Carisoprodol 350 Mg Tabs (Carisoprodol) .... 1/2-1 tab by mouth three times a day as needed for muscle spasm 9)  Colchicine 0.6 Mg Tabs (Colchicine) .[name].. 1 up to three times a day as needed for foot pain from gout 10)  Losartan Potassium 100 Mg Tabs (Losartan potassium) .[name].. 1 tab daily 11)  Metformin Hcl 500 Mg  Xr24h-tab (Metformin hcl) .... 4 tabs in am 12)  Furosemide 40 Mg Tabs (Furosemide) .[name].. 1 tab  daily as directed to reduce swelling. if increased swelling, use 2 tabs 13)  Potassium Chloride 20 Meq Pack (Potassium chloride) .... 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 ASA ! * KIDNEY DYE

## 2010-11-22 NOTE — Assessment & Plan Note (Signed)
Summary: SWOLLEN LEGS/CLE   Vital Signs:  Patient profile:   55 year old male Weight:      291 pounds Temp:     98.7 degrees F oral Pulse rate:   72 / minute Pulse rhythm:   regular BP sitting:   180 / 60  (left arm) Cuff size:   large  Vitals Entered By: Edwin Dada CMA Deborra Medina) (November 09, 2009 10:56 AM) CC: swollen legs   History of Present Illness: Notes both legs are sweilling --all the way up thighs Bad during day but does get better within an hour if he keeps his legs up started about 2 weeks ago  No changes in meds No chest pain Notes some difficulty breathing in bed. Has cough when supine Occ wheezing when in bed  No dizziness or syncope  Allergies: 1)  ! Asa 2)  ! * Kidney Dye  Past History:  Past medical, surgical, family and social histories (including risk factors) reviewed for relevance to current acute and chronic problems.  Past Medical History: Reviewed history from 04/30/2008 and no changes required. Allergic rhinitis Diabetes mellitus, type I Hyperlipidemia Hypertension Migraine Erectile dysfunction  Past Surgical History: Reviewed history from 06/20/2007 and no changes required. LIH  0000000 Umbilical hernia repair [id number]  Family History: Reviewed history from 06/20/2007 and no changes required. Mom died from DM complications in [id number] Dad died of prostate cancer in 1's.  Had brain tumor earlier Jan 16, 2023 siblings--3 have died (2 were homicide, 1 had seizures) DM strong in family  CAD in 1 brother HTN in family, esp Mom's side No colon cancer  Social History: Reviewed history from 06/20/2007 and no changes required. 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 Normal stools inbetween weight is up 6# appetite has been off  Physical Exam  General:  alert.   NAD Neck:  supple, no masses, no thyromegaly, no JVD, and no cervical lymphadenopathy.   Lungs:  normal respiratory effort, no intercostal retractions, and no accessory muscle use.  Dullness noted at bases but clear otherwise Heart:  normal rate, regular rhythm, no murmur, no gallop, and no rub.  Freq extra beats Abdomen:  soft, non-tender, no hepatomegaly, and no splenomegaly.   Pulses:  normal in feet Extremities:  2+ non pitting edema up both calves Psych:  normally interactive, good eye contact, not depressed appearing, and slightly anxious.     Impression & Recommendations:  Problem # 1:  CONGESTIVE HEART FAILURE UNSPECIFIED (ICD-428.0) Assessment New Likely has diastolic dysfunction but not clear cut Nothing to suggest ischemia but certainly possible, esp in diabetic will treat the edema and fluid overload check echo and close follow  up  His updated medication list for this problem includes:    Amlodipine Besy-benazepril Hcl 10-20 Mg Caps (Amlodipine besy-benazepril hcl) .[name]... Take 1 tablet by mouth once a day    Losartan Potassium 100 Mg Tabs (Losartan potassium) .[name]... 1 tab daily    Furosemide 40 Mg Tabs (Furosemide) .[name]... 1-2 tabs daily as directed to reduce swelling  Orders: CXR- 2view (CXR) EKG w/ Interpretation (93000) TLB-Renal Function Panel (80069-RENAL) TLB-CBC Platelet - w/Differential (85025-CBCD) TLB-Hepatic/Liver Function Pnl (80076-HEPATIC) TLB-TSH (Thyroid Stimulating Hormone) (84443-TSH) Venipuncture [id number]) TLB-BNP (B-Natriuretic Peptide) (83880-BNPR) Echo Referral (Echo)  Problem # 2:  HYPERTENSION (ICD-401.9) Assessment: Comment Only on ACEI and ARB will reconsider  this when current situation more clear  His updated medication list for this problem includes:    Amlodipine Besy-benazepril Hcl 10-20 Mg Caps (Amlodipine besy-benazepril hcl) .[name]... Take 1 tablet by mouth once a day    Losartan Potassium 100 Mg Tabs (Losartan potassium) .[name]... 1 tab  daily    Furosemide 40 Mg Tabs (Furosemide) .[name]... 1-2 tabs daily as directed to reduce swelling  Complete Medication List: 1)  Fexofenadine Hcl 180 Mg Tabs (Fexofenadine hcl) .... Take 1 tablet by mouth once a day 2)  Glyburide 5 Mg Tabs (Glyburide) .... Take 1 tablet by mouth two times a day 3)  Rhinocort Aqua 32 Mcg/act Susp (Budesonide (nasal)) .[name].. 1 spray ea. nostril daily 4)  Amlodipine Besy-benazepril Hcl 10-20 Mg Caps (Amlodipine besy-benazepril hcl) .... Take 1 tablet by mouth once a day 5)  Actos 45 Mg Tabs (Pioglitazone hcl) .[name].. 1 daily----- is now off avandia 6)  Simvastatin 40 Mg Tabs (Simvastatin) .... Take 1 tablet by mouth once a day 7)  Mens Multivitamin Plus Tabs (Multiple vitamins-minerals) .... Take 1 tablet by mouth once a day 8)  Carisoprodol 350 Mg Tabs (Carisoprodol) .... 1/2-1 tab by mouth three times a day as needed for muscle spasm 9)  Colchicine 0.6 Mg Tabs (Colchicine) .[name].. 1 up to three times a day as needed for foot pain from gout 10)  Losartan Potassium 100 Mg Tabs (Losartan potassium) .[name].. 1 tab daily 11)  Metformin Hcl 500 Mg Xr24h-tab (Metformin hcl) .... 4 tabs in am 12)  Furosemide 40 Mg Tabs (Furosemide) .[name].. 1-2 tabs daily as directed to reduce swelling  Patient Instructions: 1)  Please start furosemide today--take 1 tab first, then if no response within 2 hours, try 2 tabs and then stay with that dose 2)  Please schedule echocardiogram 3)  Please schedule a follow-up appointment in 2 weeks.  Prescriptions: FUROSEMIDE 40 MG TABS (FUROSEMIDE) 1-2 tabs daily as directed to reduce swelling  #60 x 12   Entered and Authorized by:   Claris Gower MD   Signed by:   Claris Gower MD on 11/09/2009   Method used:   Electronically to        [facility] [phone]* (retail)       [address]       [place], [place]  [address]       Ph: [phone] or [phone]       Fax: [phone]   RxID:    [id number]   Current Allergies (reviewed today): ! ASA ! * KIDNEY DYE   EKG  Procedure date:  11/09/2009  Findings:      sinus @77  Freq PVCs/bigeminy No ischemic changes

## 2010-11-22 NOTE — Assessment & Plan Note (Signed)
Summary: 28 M F/U DLO   Vital Signs:  Patient profile:   55 year old male Weight:      275 pounds BMI:     36.41 Temp:     98.1 degrees F oral Pulse rate:   60 / minute Pulse rhythm:   regular BP sitting:   150 / 68  (left arm) Cuff size:   large  Vitals Entered By: Edwin Dada CMA Deborra Medina) (July 03, 2010 4:03 PM) CC: 6 month follow-up   History of Present Illness: doing okay  No new concerns Has been having pain in left great toe and medial left knee is also tender Takes the colcyrs every day  Still with sporadic back problems gets stiff and painful in buttocks and above hips in back Hard to loosen up after sitting for a while  Work has been especially stressful 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 Discussed eating snack No numbness  or ulcers in feet  No chest pain No SOB no change in exercise tolerance  Allergies: 1)  ! Asa 2)  ! * Kidney Dye  Past History:  Past medical, surgical, family and social histories (including risk factors) reviewed for relevance to current acute and chronic problems.  Past Medical History: Reviewed history from 01/06/2010 and no changes required. Allergic rhinitis Diabetes mellitus, type II Hyperlipidemia Hypertension Migraine Erectile dysfunction CHF--diastolic Gout  Past Surgical History: Reviewed history from 06/20/2007 and no changes required. LIH  0000000 Umbilical hernia repair [id number]  Family History: Reviewed history from 06/20/2007 and no changes required. Mom died from DM complications in [id number] Dad died of prostate cancer in 77's.  Had brain tumor earlier 29-Dec-2022 siblings--3 have died (2 were homicide, 1 had seizures) DM strong in family  CAD in 1 brother HTN in family, esp Mom's side No colon  cancer  Social History: Reviewed history from 06/20/2007 and no changes required. 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   Neck:  supple, no masses, no thyromegaly, no carotid bruits, and no cervical lymphadenopathy.   Lungs:  normal respiratory effort, no intercostal retractions, no accessory muscle use, and normal breath sounds.   Heart:  normal rate, regular rhythm, no murmur, and no gallop.   Abdomen:  soft and non-tender.   Msk:  slight tenderness at left 1st PIP of toe knee is quiet Pulses:  2+ in feet Extremities:  no sig edema Skin:  no suspicious lesions and no ulcerations.   Psych:  normally interactive, good eye contact, not anxious appearing, and not depressed appearing.    Diabetes Management Exam:    Foot Exam (with socks and/or shoes not present):       Sensory-Pinprick/Light touch:          Left medial foot (L-4): normal          Left dorsal foot (L-5): normal          Left lateral foot (S-1): normal  Right medial foot (L-4): normal          Right dorsal foot (L-5): normal          Right lateral foot (S-1): normal       Inspection:          Left foot: normal          Right foot: normal       Nails:          Left foot: normal          Right foot: normal   Impression & Recommendations:  Problem # 1:  DIABETES MELLITUS, TYPE II (ICD-250.00) Assessment Improved  good control and getting low sugar reactions will stop the actos restart at lower dose if >200 regularly  The following medications were removed from the medication list:    Actos 45 Mg Tabs (Pioglitazone hcl) .[name]... 1 daily----- is now off avandia His  updated medication list for this problem includes:    Glyburide 5 Mg Tabs (Glyburide) .[name]... Take 1 tablet by mouth two times a day    Amlodipine Besy-benazepril Hcl 10-20 Mg Caps (Amlodipine besy-benazepril hcl) .[name]... Take 1 tablet by mouth once a day    Losartan Potassium 100 Mg Tabs (Losartan potassium) .[name]... 1 tab daily    Metformin Hcl 500 Mg Xr24h-tab (Metformin hcl) .Marland KitchenMarland KitchenMarland KitchenMarland Kitchen 4 tabs in am  Labs Reviewed: Creat: 1.1 (01/06/2010)     Last Eye Exam: Mild non-proliferative diabetic retinopathy.    (09/22/2009) Reviewed HgBA1c results: 6.9 (01/06/2010)  7.0 (06/28/2009)  Orders: TLB-A1C / Hgb A1C (Glycohemoglobin) (83036-A1C)  Problem # 2:  GOUT (ICD-274.9) Assessment: Unchanged mild chronic symptoms despite daily colchicine will add allopurinol  His updated medication list for this problem includes:    Colcrys 0.6 Mg Tabs (Colchicine) .[name]... Take one up to three times a day for pain from gout    Allopurinol 300 Mg Tabs (Allopurinol) .[name]... 1 tab daily to prevent the gout  Problem # 3:  CHRONIC DIASTOLIC HEART FAILURE (0000000) Assessment: Improved doing well will stop the actos since that could be a factor in his problems  His updated medication list for this problem includes:    Amlodipine Besy-benazepril Hcl 10-20 Mg Caps (Amlodipine besy-benazepril hcl) .[name]... Take 1 tablet by mouth once a day    Losartan Potassium 100 Mg Tabs (Losartan potassium) .[name]... 1 tab daily    Furosemide 40 Mg Tabs (Furosemide) .[name]... 1 tab  daily as directed to reduce swelling. if increased swelling, use 2 tabs  Problem # 4:  HYPERTENSION (ICD-401.9) Assessment: Comment Only  up a bit but generally okay no changes for now  His updated medication list for this problem includes:    Amlodipine Besy-benazepril Hcl 10-20 Mg Caps (Amlodipine besy-benazepril hcl) .[name]... Take 1 tablet by mouth once a day    Losartan Potassium 100 Mg Tabs (Losartan potassium) .[name]... 1 tab daily    Furosemide 40 Mg Tabs  (Furosemide) .[name]... 1 tab  daily as directed to reduce swelling. if increased swelling, use 2 tabs  BP today: 150/68 Prior BP: 130/68 (01/24/2010)  Labs Reviewed: K+: 4.2 (01/06/2010) Creat: : 1.1 (01/06/2010)   Chol: 173 (06/19/2010)   HDL: 55.00 (06/19/2010)   LDL: 104 (06/19/2010)   TG: 71.0 (06/19/2010)  Orders: TLB-Renal Function Panel (80069-RENAL) Venipuncture [id number])  Complete Medication List: 1)  Fexofenadine Hcl 180 Mg Tabs (Fexofenadine hcl) .... Take 1 tablet by mouth once a day 2)  Glyburide 5 Mg Tabs (Glyburide) .... Take 1 tablet by  mouth two times a day 3)  Amlodipine Besy-benazepril Hcl 10-20 Mg Caps (Amlodipine besy-benazepril hcl) .... Take 1 tablet by mouth once a day 4)  Pravastatin Sodium 40 Mg Tabs (Pravastatin sodium) .... Take one tablet by mouth daily 5)  Carisoprodol 350 Mg Tabs (Carisoprodol) .... 1/2-1 tab by mouth three times a day as needed for muscle spasm 6)  Losartan Potassium 100 Mg Tabs (Losartan potassium) .[name].. 1 tab daily 7)  Metformin Hcl 500 Mg Xr24h-tab (Metformin hcl) .... 4 tabs in am 8)  Furosemide 40 Mg Tabs (Furosemide) .[name].. 1 tab  daily as directed to reduce swelling. if increased swelling, use 2 tabs 9)  Potassium Chloride 20 Meq Pack (Potassium chloride) .... Take 1 by mouth once daily with the furosemide 10)  Levitra 20 Mg Tabs (Vardenafil hcl) .... Take 1 tab about 30-60 minutes before sex 11)  Colcrys 0.6 Mg Tabs (Colchicine) .... Take one up to three times a day for pain from gout 12)  Rhinocort Aqua 32 Mcg/act Susp (Budesonide (nasal)) .[name].. 1 spray ea. nostril daily 13)  Mens Multivitamin Plus Tabs (Multiple vitamins-minerals) .... Take 1 tablet by mouth once a day 14)  Allopurinol 300 Mg Tabs (Allopurinol) .[name].. 1 tab daily to prevent the gout  Patient Instructions: 1)  Please start the allopurinol daily. If the gout seems quiet, please stop the colcrys and only use it if the gout flares again 2)  Please stop the actos---call if  fasting sugars are regularly over 180 3)  Please schedule a follow-up appointment in 6 months for physical Prescriptions: ALLOPURINOL 300 MG TABS (ALLOPURINOL) 1 tab daily to prevent the gout  #30 x 11   Entered and Authorized by:   Claris Gower MD   Signed by:   Claris Gower MD on 07/03/2010   Method used:   Electronically to        [facility] [phone]* (retail)       [address]       [place], [place]  [address]       Ph: [phone] or [phone]       Fax: [phone]   RxID:   [id number] COLCRYS 0.6 MG TABS (COLCHICINE) take one up to three times a day for pain from gout  #90 x 3   Entered and Authorized by:   Claris Gower MD   Signed by:   Claris Gower MD on 07/03/2010   Method used:   Electronically to        [facility] [phone]* (retail)       [address]       [place], [place]  [address]       Ph: [phone] or [phone]       Fax: [phone]   RxID:   [id number] LEVITRA 20 MG TABS (VARDENAFIL HCL) take 1 tab about 30-60 minutes before sex  #10 x 3   Entered by:   Edwin Dada CMA ([place])   Authorized by:   Claris Gower MD   Signed by:   Edwin Dada CMA ([place]) on 07/03/2010   Method used:   Electronically to        [facility] [phone]* (retail)       [address]       [place],  [place]  [address]       Ph: [phone] or [phone]       Fax: [phone]   RxID:   [id number] METFORMIN HCL 500 MG XR24H-TAB (METFORMIN HCL) 4 tabs in AM  #120 x 12   Entered by:   Edwin Dada CMA ([place])   Authorized by:   Claris Gower MD   Signed by:   Edwin Dada CMA ([place]) on 07/03/2010   Method used:   Electronically to        [facility] [phone]* (retail)       [address]       [place], [place]  [address]       Ph: [phone] or [phone]       Fax: [phone]   RxID:    [id number] LOSARTAN POTASSIUM 100 MG TABS (LOSARTAN POTASSIUM) 1 tab daily  #30 x 12   Entered by:   Edwin Dada CMA ([place])   Authorized by:   Claris Gower MD   Signed by:   Edwin Dada CMA ([place]) on 07/03/2010   Method used:   Electronically to        [facility] [phone]* (retail)       [address]       [place], [place]  [address]       Ph: [phone] or [phone]       Fax: [phone]   RxID:   [id number] ACTOS 45 MG  TABS (PIOGLITAZONE HCL) 1 daily----- Is now off avandia  #30 x 12   Entered by:   Edwin Dada CMA ([place])   Authorized by:   Claris Gower MD   Signed by:   Edwin Dada CMA ([place]) on 07/03/2010   Method used:   Electronically to        [facility] [phone]* (retail)       [address]       [place], [place]  [address]       Ph: [phone] or [phone]       Fax: [phone]   RxID:   [id number] RHINOCORT AQUA 32 MCG/ACT  SUSP (BUDESONIDE (NASAL)) 1 spray ea. nostril daily  #8.6 Mill[place] x 2   Entered by:   Edwin Dada CMA ([place])   Authorized by:   Claris Gower MD   Signed by:   Edwin Dada CMA ([place]) on 07/03/2010   Method used:   Electronically to        [facility] [phone]* (retail)       [address]       [place], [place]  [address]       Ph: [phone] or [phone]       Fax: [phone]   RxID:   [id number]   Current Allergies (reviewed today): ! ASA ! * KIDNEY DYE

## 2010-11-22 NOTE — Assessment & Plan Note (Signed)
Summary: CDL PHYSICAL / LFW   Vital Signs:  Patient profile:   55 year old male Weight:      281 pounds Temp:     97.9 degrees F oral Pulse rate:   68 / minute Pulse rhythm:   regular BP sitting:   130 / 68  (left arm) Cuff size:   large  Vitals Entered By: Edwin Dada CMA Deborra Medina) (January 24, 2010 11:19 AM) CC: CDL physical  Vision Screening:Left eye with correction: 20 / 20 Right eye with correction: 20 / 20 Both eyes with correction: 20 / 20        Vision Entered By: Edwin Dada CMA Deborra Medina) (January 24, 2010 11:22 AM)   History of Present Illness: Here for CDL  doing well gout generally quiet--will continue colchicine for now  sugars still okay No hypoglycemic spells--just gets hungry  Allergies: 1)  ! Asa 2)  ! * Kidney Dye  Past History:  Past medical, surgical, family and social histories (including risk factors) reviewed for relevance to current acute and chronic problems.  Past Medical History: Reviewed history from 01/06/2010 and no changes required. Allergic rhinitis Diabetes mellitus, type II Hyperlipidemia Hypertension Migraine Erectile dysfunction CHF--diastolic Gout  Past Surgical History: Reviewed history from 06/20/2007 and no changes required. LIH  0000000 Umbilical hernia repair [id number]  Family History: Reviewed history from 06/20/2007 and no changes required. Mom died from DM complications in [id number] Dad died of prostate cancer in 37's.  Had brain tumor earlier 08-Jan-2023 siblings--3 have died (2 were homicide, 1 had seizures) DM strong in family  CAD in 1 brother HTN in family, esp Mom's side No colon cancer  Social History: Reviewed history from 06/20/2007 and no changes required. 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 Eyes:   Denies double vision and vision loss-1 eye. ENT:  Denies decreased hearing and ringing in ears; teeth problems now---getting dental evaluations (has partials, etc). CV:  Denies chest pain or discomfort, difficulty breathing at night, difficulty breathing while lying down, fainting, lightheadness, palpitations, and shortness of breath with exertion. Resp:  Denies cough and shortness of breath. GI:  Denies abdominal pain, bloody stools, change in bowel habits, dark tarry stools, indigestion, nausea, and vomiting. GU:  Complains of erectile dysfunction; denies urinary frequency and urinary hesitancy; got Rx for cialis but couldn't afford. MS:  Complains of joint pain; denies joint swelling; slight soreness on inside of left knee--not a big deal. Derm:  Denies lesion(s) and rash. Neuro:  Denies headaches, numbness, tingling, and weakness. Psych:  Denies anxiety and depression. Heme:  Denies abnormal bruising and enlarge lymph nodes. Allergy:  Complains of seasonal allergies and sneezing; uses OTC meds and nasal spray--satisfied with this.  Physical Exam  General:  alert and normal appearance.   Eyes:  pupils equal, pupils round, pupils reactive to light, and no optic disk abnormalities.   Ears:  R ear normal and L ear normal.   Mouth:  no erythema and no lesions.   Neck:  supple, no masses, no thyromegaly, no carotid bruits, and no cervical lymphadenopathy.   Lungs:  normal respiratory effort and normal breath sounds.   Heart:  normal rate, regular rhythm, no murmur, and no gallop.   Abdomen:  soft, non-tender, no masses, and no inguinal hernia.   Rectal:  no hemorrhoids and no masses.   Prostate:  no gland enlargement and no nodules.   Msk:  no joint tenderness and no joint swelling.   Pulses:  1+ in feet Extremities:  no edema Neurologic:  alert & oriented X3, strength normal in all extremities, and gait normal.   Skin:  no rashes, no suspicious lesions, and no ulcerations.   Axillary  Nodes:  No palpable lymphadenopathy Psych:  normally interactive, good eye contact, not anxious appearing, and not depressed appearing.     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 Take 1 tab about 30-60 minutes before sex  Complete Medication List: 1)  Fexofenadine Hcl 180 Mg Tabs (Fexofenadine hcl) .... Take 1 tablet by mouth once a day 2)  Glyburide 5 Mg Tabs (Glyburide) .... Take 1 tablet by mouth two times a day 3)  Rhinocort Aqua 32 Mcg/act Susp (Budesonide (nasal)) .[name].. 1 spray ea. nostril daily 4)  Amlodipine Besy-benazepril Hcl 10-20 Mg Caps (Amlodipine besy-benazepril hcl) .... Take 1 tablet by mouth once a day 5)  Actos 45 Mg Tabs (Pioglitazone hcl) .[name].. 1 daily----- is now off avandia 6)  Simvastatin 40 Mg Tabs (Simvastatin) .... Take 1 tablet by mouth once a day 7)  Mens Multivitamin Plus Tabs (Multiple vitamins-minerals) .... Take 1 tablet by mouth once a day 8)  Carisoprodol 350 Mg Tabs (Carisoprodol) .... 1/2-1 tab by mouth three times a day as needed for muscle spasm 9)  Colchicine 0.6 Mg Tabs (Colchicine) .[name].. 1 up to three times a day as needed for foot pain from gout 10)  Losartan Potassium 100 Mg Tabs (Losartan potassium) .[name].. 1 tab daily 11)  Metformin Hcl 500 Mg Xr24h-tab (Metformin hcl) .... 4 tabs in am 12)  Furosemide 40 Mg Tabs (Furosemide) .[name].. 1 tab  daily as directed to reduce swelling. if increased swelling, use 2 tabs 13)  Potassium Chloride 20 Meq Pack (Potassium chloride) .... Take 1 by mouth once daily with the furosemide 14)  Levitra 20 Mg Tabs (Vardenafil hcl) .... Take 1 tab about 30-60 minutes before sex  Patient  Instructions: 1)  Please schedule a follow-up appointment in 6 months .  Prescriptions: LEVITRA 20 MG TABS (VARDENAFIL HCL) take 1 tab about 30-60 minutes before sex  #10 x 3   Entered and Authorized by:   Claris Gower MD   Signed by:   Claris Gower MD on 01/24/2010   Method used:   Electronically to        [facility] (retail)       [address], [place], [place]  [address]       Ph: [phone]       Fax: [phone]   RxID:   [id number]   Current Allergies (reviewed today): ! ASA ! * KIDNEY DYE  Laboratory Results   Urine Tests  Date/Time Received: January 24, 2010 11:25 AM Date/Time Reported: January 24, 2010 11:25 AM  Routine Urinalysis   Color: yellow Appearance: Clear Glucose: negative   (Normal Range: Negative) Bilirubin: negative   (Normal  Range: Negative) Ketone: negative   (Normal Range: Negative) Spec. Gravity: 1.015   (Normal Range: 1.003-1.035) Blood: negative   (Normal Range: Negative) pH: 6.0   (Normal Range: 5.0-8.0) Protein: negative   (Normal Range: Negative) Urobilinogen: 0.2   (Normal Range: 0-1) Nitrite: negative   (Normal Range: Negative) Leukocyte Esterace: negative   (Normal Range: Negative)

## 2010-11-22 NOTE — Progress Notes (Signed)
Summary: Legs and arms cramping  Phone Note Call from Patient Call back at [phone]   Caller: Patient Call For: Claris Gower MD Summary of Call: Pt said since taking Furosemide has been having cramps in legs and arms usually in evening or at night when goes to bed. 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  Pt not sure of Carisoprodol 350mg  will help leg cramps or does Dr. Silvio Pate want to call in another med. [facility] uses [facility] [phone] if pharmacy needed. Please advise.  Initial call taken by: Ozzie Hoyle LPN,  January 21, 624THL 8:44 AM  Follow-up for Phone Call        he may need potassium supplements  have him start KCl 70meq daily with the furosemide #30 x 3  I would not advise muscle relaxants due to possible sedation Follow-up by: Claris Gower MD,  November 11, 2009 8:57 AM  Additional Follow-up for Phone Call Additional follow up Details #1::        Spoke with patient and advised results. Rx sent to pharmacy Additional Follow-up by: DeShannon Tamala Julian CMA Deborra Medina),  November 11, 2009 9:59 AM    New/Updated Medications: POTASSIUM CHLORIDE 20 MEQ PACK (POTASSIUM CHLORIDE) take 1 by mouth once daily with the furosemide Prescriptions: POTASSIUM CHLORIDE 20 MEQ PACK (POTASSIUM CHLORIDE) take 1 by mouth once daily with the furosemide  #30 x 3   Entered by:   Edwin Dada CMA ([place])   Authorized by:   Claris Gower MD   Signed by:   Edwin Dada CMA ([place]) on 11/11/2009   Method used:   Electronically to        [facility] [phone]* (retail)       [address]       [place], [place]  [address]       Ph: [phone] or [phone]       Fax: [phone]   RxID:   [id number]

## 2010-11-23 NOTE — Assessment & Plan Note (Signed)
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 Asa 2)  ! * Kidney Dye  Past History:  Past medical, surgical, family and social histories (including risk factors) reviewed for relevance to current acute and chronic problems.  Past Medical History: Reviewed history from 01/06/2010 and no changes required. Allergic rhinitis Diabetes mellitus, type II Hyperlipidemia Hypertension Migraine Erectile dysfunction CHF--diastolic Gout  Past Surgical History: Reviewed history from 06/20/2007 and no changes required. LIH  0000000 Umbilical hernia repair [id number]  Family History: Reviewed history from 06/20/2007 and no changes required. Mom died from DM complications in [id number] Dad died of prostate cancer in 49's.  Had brain tumor earlier 2023-01-18 siblings--3 have died (2 were homicide, 1 had seizures) DM strong in family  CAD in 1 brother HTN in family, esp Mom's side No colon cancer  Social History: Reviewed history from 06/20/2007 and no changes required. Occupation: Appliance delivery Married--1 daughter Never Smoked Alcohol use--beer mostly on the weekends  Review of Systems       Has been having more cramps Taking lots of fluids and  potassium/magnesium supplements  Physical Exam  General:  alert and normal appearance.   Msk:  No knee swelling Normal passive ROM slight pain with medial strain Macmurrays strongly positive for medial meniscus Lachman's negative All ligaments seem normal Extremities:  no sig edema Neurologic:  strength normal in all extremities and gait normal.     Impression & Recommendations:  Problem # 1:  KNEE PAIN, LEFT (ICD-719.46) Assessment New  new onset of severe pain over the past month can't sleep with this Exam indicates torn meniscus  P: will try tramadol for pain at night    ortho eval  Orders: Orthopedic Surgeon Referral (Ortho Surgeon)  Complete Medication List: 1)  Allopurinol 300 Mg Tabs (Allopurinol) .[name].. 1 tab daily to prevent the gout 2)  Fexofenadine Hcl 180 Mg Tabs (Fexofenadine hcl) .... Take 1 tablet by mouth once a day 3)  Glyburide 5 Mg Tabs (Glyburide) .... Take 1 tablet by mouth two times a day 4)  Amlodipine Besy-benazepril Hcl 10-20 Mg Caps (Amlodipine besy-benazepril hcl) .... Take 1 tablet by mouth once a day 5)  Pravastatin Sodium 40 Mg Tabs (Pravastatin sodium) .... Take one tablet by mouth daily 6)  Carisoprodol 350 Mg Tabs (Carisoprodol) .... 1/2-1 tab by mouth three times a day as needed for muscle spasm 7)  Losartan Potassium 100 Mg Tabs (Losartan potassium) .[name].. 1 tab daily 8)  Metformin Hcl 500 Mg Xr24h-tab (Metformin hcl) .... 4 tabs in am 9)  Furosemide 40 Mg Tabs (Furosemide) .Marland KitchenMarland KitchenMarland Kitchen 1  tab  daily as directed to reduce swelling. if increased swelling, use 2 tabs 10)  Potassium Chloride 20 Meq Pack (Potassium chloride) .... Take 1 by mouth once daily with the furosemide 11)  Levitra 20 Mg Tabs (Vardenafil hcl) .... Take 1 tab about 30-60 minutes before sex 12)  Colcrys 0.6 Mg Tabs (Colchicine) .... Take one up to three times a day for pain from gout 13)  Rhinocort Aqua 32 Mcg/act Susp (Budesonide (nasal)) .[name].. 1 spray ea. nostril daily 14)  Mens  Multivitamin Plus Tabs (Multiple vitamins-minerals) .... Take 1 tablet by mouth once a day 15)  Potassium & Magnesium Aspartat 250-250 Mg Caps (Mag aspart-potassium aspart) .... Once daily 16)  Tramadol Hcl 50 Mg Tabs (Tramadol hcl) .[name].. 1-2 tabs by mouth at bedtime as needed for knee pain  Patient Instructions: 1)  Keep regular appt 2)  Referral Appointment Information 3)  Day/Date: 4)  Time: 5)  Place/MD: 6)  Address: 7)  Phone/Fax: 8)  Patient given appointment information. Information/Orders faxed/mailed. Prescriptions: TRAMADOL HCL 50 MG TABS (TRAMADOL HCL) 1-2 tabs by mouth at bedtime as needed for knee pain  #60 x 0   Entered and Authorized by:   Claris Gower MD   Signed by:   Claris Gower MD on 11/17/2010   Method used:   Electronically to        [facility] [phone]* (retail)       [address]       [place], [place]  [address]       Ph: [phone] or [phone]       Fax: [phone]   RxID:   [id number]    Orders Added: 1)  Orthopedic Surgeon Referral [Ortho Surgeon] 2)  Est. Patient Level IV [id number]    Current Allergies (reviewed today): ! ASA ! * KIDNEY DYE

## 2010-12-14 ENCOUNTER — Encounter: Payer: Self-pay | Admitting: Internal Medicine

## 2010-12-14 ENCOUNTER — Other Ambulatory Visit: Payer: Self-pay | Admitting: Internal Medicine

## 2010-12-14 ENCOUNTER — Encounter (INDEPENDENT_AMBULATORY_CARE_PROVIDER_SITE_OTHER): Payer: 59 | Admitting: Internal Medicine

## 2010-12-14 DIAGNOSIS — E119 Type 2 diabetes mellitus without complications: Secondary | ICD-10-CM

## 2010-12-14 DIAGNOSIS — I509 Heart failure, unspecified: Secondary | ICD-10-CM

## 2010-12-14 DIAGNOSIS — I10 Essential (primary) hypertension: Secondary | ICD-10-CM

## 2010-12-14 DIAGNOSIS — R252 Cramp and spasm: Secondary | ICD-10-CM | POA: Insufficient documentation

## 2010-12-14 DIAGNOSIS — I5032 Chronic diastolic (congestive) heart failure: Secondary | ICD-10-CM

## 2010-12-14 LAB — HM DIABETES FOOT EXAM

## 2010-12-15 LAB — RENAL FUNCTION PANEL
CO2: 25 mEq/L (ref 19–32)
Calcium: 9.1 mg/dL (ref 8.4–10.5)
Chloride: 108 mEq/L (ref 96–112)
Creatinine, Ser: 1.5 mg/dL (ref 0.4–1.5)
Sodium: 140 mEq/L (ref 135–145)

## 2010-12-15 LAB — HEPATIC FUNCTION PANEL
ALT: 31 U/L (ref 0–53)
AST: 21 U/L (ref 0–37)
Albumin: 3.2 g/dL — ABNORMAL LOW (ref 3.5–5.2)
Total Protein: 6.4 g/dL (ref 6.0–8.3)

## 2010-12-15 LAB — CBC WITH DIFFERENTIAL/PLATELET
Basophils Relative: 0.4 % (ref 0.0–3.0)
Eosinophils Relative: 2.1 % (ref 0.0–5.0)
HCT: 36.4 % — ABNORMAL LOW (ref 39.0–52.0)
Hemoglobin: 12.1 g/dL — ABNORMAL LOW (ref 13.0–17.0)
Lymphs Abs: 2.3 10*3/uL (ref 0.7–4.0)
MCV: 89.7 fl (ref 78.0–100.0)
Monocytes Absolute: 0.5 10*3/uL (ref 0.1–1.0)
Monocytes Relative: 8.8 % (ref 3.0–12.0)
Neutro Abs: 3.2 10*3/uL (ref 1.4–7.7)
Platelets: 175 10*3/uL (ref 150.0–400.0)
RBC: 4.07 Mil/uL — ABNORMAL LOW (ref 4.22–5.81)
WBC: 6.2 10*3/uL (ref 4.5–10.5)

## 2010-12-15 LAB — TSH: TSH: 1.23 u[IU]/mL (ref 0.35–5.50)

## 2010-12-15 LAB — HEMOGLOBIN A1C: Hgb A1c MFr Bld: 9.9 % — ABNORMAL HIGH (ref 4.6–6.5)

## 2010-12-19 NOTE — Assessment & Plan Note (Signed)
Summary: Follow up   Vital Signs:  Patient profile:   55 year old male Weight:      280 pounds Temp:     98.6 degrees F oral Pulse rate:   75 / minute Pulse rhythm:   regular BP sitting:   136 / 76  (right arm) Cuff size:   large  Vitals Entered By: Edwin Dada CMA Deborra Medina) (December 14, 2010 3:16 PM) CC: follow-up visit, severe muscle cramps   History of Present Illness: Still having pain in various places Ongoing muscle cramps in hands, legs, arms takes the potassium, coconut juice, etc  Saw Dr Marlou Sa for left knee pain MRI just showed arthrits no action for now tramadol helps but only using at night Does note funny taste in mouth inAM but no other problems has some relief from tylenol  Sugars pretty good till steroid shot in knee a couple of weeks ago only slight help for knee but sore again at work Usually under 120 No hypoglycemic reactions  Checks BP at home generally 130-140/70's No headache  Allergies: 1)  ! Asa 2)  ! * Kidney Dye  Past History:  Past medical, surgical, family and social histories (including risk factors) reviewed for relevance to current acute and chronic problems.  Past Medical History: Reviewed history from 01/06/2010 and no changes required. Allergic rhinitis Diabetes mellitus, type II Hyperlipidemia Hypertension Migraine Erectile dysfunction CHF--diastolic Gout  Past Surgical History: Reviewed history from 06/20/2007 and no changes required. LIH  0000000 Umbilical hernia repair [id number]  Family History: Reviewed history from 06/20/2007 and no changes required. Mom died from DM complications in [id number] Dad died of prostate cancer in 51's.  Had brain tumor earlier 01-16-23 siblings--3 have died (2 were homicide, 1 had seizures) DM strong in family  CAD in 1 brother HTN in family, esp Mom's side No colon cancer  Social History: Reviewed history from 06/20/2007 and no changes required. 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   Neck:  supple, no masses, no thyromegaly, and no cervical lymphadenopathy.   Lungs:  normal respiratory effort, no intercostal retractions, no accessory muscle use, and normal breath sounds.   Heart:  normal rate, regular rhythm, no murmur, and no gallop.   Pulses:  2+ in feet Extremities:  no edema Skin:  no rashes, no suspicious lesions, and no ulcerations.   Psych:  normally interactive, good eye contact, not anxious appearing, and not depressed appearing.    Diabetes Management Exam:    Foot Exam (with socks and/or shoes not present):       Sensory-Pinprick/Light touch:          Left medial foot (L-4): normal          Left dorsal foot (L-5): normal          Left lateral foot (S-1): normal          Right medial foot (L-4): normal          Right dorsal foot (L-5): normal          Right lateral foot (S-1): normal       Inspection:          Left foot: normal          Right foot: normal       Nails:          Left foot: normal  Right foot: normal   Impression & Recommendations:  Problem # 1:  MUSCLE CRAMPS (ICD-729.82) Assessment Deteriorated will try off the statin  Problem # 2:  DIABETES MELLITUS, TYPE II (ICD-250.00) Assessment: Unchanged  seems to still have good control  His updated medication list for this problem includes:    Glyburide 5 Mg Tabs (Glyburide) .[name]... Take 1 tablet by mouth two times a day    Amlodipine Besy-benazepril Hcl 10-20 Mg Caps (Amlodipine besy-benazepril hcl) .[name]... Take 1 tablet by mouth once a day    Losartan Potassium 100 Mg Tabs (Losartan potassium) .[name]... 1 tab daily    Metformin Hcl 500 Mg Xr24h-tab (Metformin hcl) .Marland KitchenMarland KitchenMarland KitchenMarland Kitchen 4 tabs in am  Labs Reviewed: Creat: 1.5 (07/03/2010)     Last Eye Exam: Mild non-proliferative diabetic retinopathy.    (09/22/2009) Reviewed HgBA1c results: 7.7 (07/03/2010)  6.9  (01/06/2010)  Orders: TLB-A1C / Hgb A1C (Glycohemoglobin) (83036-A1C)  Problem # 3:  KNEE PAIN, LEFT (ICD-719.46) Assessment: Unchanged discussed an increased pain regimen for arthritis in the knee  The following medications were removed from the medication list:    Carisoprodol 350 Mg Tabs (Carisoprodol) .[name]... 1/2-1 tab by mouth three times a day as needed for muscle spasm His updated medication list for this problem includes:    Tramadol Hcl 50 Mg Tabs (Tramadol hcl) .[name]... 1/2-1 tab by mouth three times a day for knee pain as needed    Acetaminophen 650 Mg Cr-tabs (Acetaminophen) .[name]... 1 by mouth three times a day for arthritis pain  Problem # 4:  CHRONIC DIASTOLIC HEART FAILURE (0000000) Assessment: Unchanged  compensated on current regimen  His updated medication list for this problem includes:    Amlodipine Besy-benazepril Hcl 10-20 Mg Caps (Amlodipine besy-benazepril hcl) .[name]... Take 1 tablet by mouth once a day    Losartan Potassium 100 Mg Tabs (Losartan potassium) .[name]... 1 tab daily    Furosemide 40 Mg Tabs (Furosemide) .[name]... 1 tab  daily as directed to reduce swelling. if increased swelling, use 2 tabs  Orders: TLB-Renal Function Panel (80069-RENAL) TLB-CBC Platelet - w/Differential (85025-CBCD) TLB-Hepatic/Liver Function Pnl (80076-HEPATIC) TLB-TSH (Thyroid Stimulating Hormone) (84443-TSH) Venipuncture [id number])  Problem # 5:  HYPERTENSION (ICD-401.9) Assessment: Unchanged reasonable control  His updated medication list for this problem includes:    Amlodipine Besy-benazepril Hcl 10-20 Mg Caps (Amlodipine besy-benazepril hcl) .[name]... Take 1 tablet by mouth once a day    Losartan Potassium 100 Mg Tabs (Losartan potassium) .[name]... 1 tab daily    Furosemide 40 Mg Tabs (Furosemide) .[name]... 1 tab  daily as directed to reduce swelling. if increased swelling, use 2 tabs  BP today: 136/76 Prior BP: 148/69 (11/17/2010)  Labs Reviewed: K+: 4.4 (07/03/2010) Creat: : 1.5  (07/03/2010)   Chol: 173 (06/19/2010)   HDL: 55.00 (06/19/2010)   LDL: 104 (06/19/2010)   TG: 71.0 (06/19/2010)  Complete Medication List: 1)  Tramadol Hcl 50 Mg Tabs (Tramadol hcl) .... 1/2-1 tab by mouth three times a day for knee pain as needed 2)  Allopurinol 300 Mg Tabs (Allopurinol) .[name].. 1 tab daily to prevent the gout 3)  Glyburide 5 Mg Tabs (Glyburide) .... Take 1 tablet by mouth two times a day 4)  Amlodipine Besy-benazepril Hcl 10-20 Mg Caps (Amlodipine besy-benazepril hcl) .... Take 1 tablet by mouth once a day 5)  Losartan Potassium 100 Mg Tabs (Losartan potassium) .[name].. 1 tab daily 6)  Metformin Hcl 500 Mg Xr24h-tab (Metformin hcl) .... 4 tabs in am 7)  Furosemide 40 Mg Tabs (Furosemide) .Marland KitchenMarland KitchenMarland Kitchen  1 tab  daily as directed to reduce swelling. if increased swelling, use 2 tabs 8)  Potassium Chloride 20 Meq Pack (Potassium chloride) .... Take 1 by mouth once daily with the furosemide 9)  Levitra 20 Mg Tabs (Vardenafil hcl) .... Take 1 tab about 30-60 minutes before sex 10)  Colcrys 0.6 Mg Tabs (Colchicine) .... Take one up to three times a day for pain from gout 11)  Rhinocort Aqua 32 Mcg/act Susp (Budesonide (nasal)) .[name].. 1 spray ea. nostril daily 12)  Mens Multivitamin Plus Tabs (Multiple vitamins-minerals) .... Take 1 tablet by mouth once a day 13)  Potassium & Magnesium Aspartat 250-250 Mg Caps (Mag aspart-potassium aspart) .... Once daily 14)  Acetaminophen 650 Mg Cr-tabs (Acetaminophen) .[name].. 1 by mouth three times a day for arthritis pain  Patient Instructions: 1)  Please stop the pravastatin to see if that will help the muscle cramps. 2)  Please try the acetominophen (tylenol) three times a day and use the tramadol up to three times a day as needed if pain in knee conitinues 3)  Please schedule a follow-up appointment in 4 months .    Orders Added: 1)  Est. Patient Level IV [id number] 2)  TLB-Renal Function Panel [80069-RENAL] 3)  TLB-CBC Platelet - w/Differential [85025-CBCD] 4)   TLB-Hepatic/Liver Function Pnl [80076-HEPATIC] 5)  TLB-TSH (Thyroid Stimulating Hormone) [84443-TSH] 6)  Venipuncture [36415] 7)  TLB-A1C / Hgb A1C (Glycohemoglobin) [83036-A1C]    Current Allergies (reviewed today): ! ASA ! * KIDNEY DYE

## 2011-01-11 ENCOUNTER — Other Ambulatory Visit: Payer: Self-pay | Admitting: Internal Medicine

## 2011-01-12 NOTE — Telephone Encounter (Signed)
Okay to refill for 1 year but check with him as it may be cheaper to use generic fluticasone 2 sprays each nostril daily

## 2011-01-12 NOTE — Telephone Encounter (Signed)
Rx sent to pharmacy   

## 2011-02-23 ENCOUNTER — Encounter: Payer: Self-pay | Admitting: Internal Medicine

## 2011-04-02 ENCOUNTER — Other Ambulatory Visit: Payer: Self-pay | Admitting: *Deleted

## 2011-04-02 MED ORDER — AMLODIPINE BESY-BENAZEPRIL HCL 10-20 MG PO CAPS: 1.0000 | ORAL_CAPSULE | Freq: Every day | ORAL | Status: DC

## 2011-04-02 NOTE — Telephone Encounter (Signed)
rx sent to pharmacy by e-script  

## 2011-04-05 ENCOUNTER — Encounter: Payer: Self-pay | Admitting: Internal Medicine

## 2011-04-09 ENCOUNTER — Encounter: Payer: Self-pay | Admitting: Internal Medicine

## 2011-04-09 ENCOUNTER — Ambulatory Visit (INDEPENDENT_AMBULATORY_CARE_PROVIDER_SITE_OTHER): Payer: 59 | Admitting: Internal Medicine

## 2011-04-09 DIAGNOSIS — I5032 Chronic diastolic (congestive) heart failure: Secondary | ICD-10-CM

## 2011-04-09 DIAGNOSIS — E119 Type 2 diabetes mellitus without complications: Secondary | ICD-10-CM

## 2011-04-09 DIAGNOSIS — M25569 Pain in unspecified knee: Secondary | ICD-10-CM

## 2011-04-09 DIAGNOSIS — I10 Essential (primary) hypertension: Secondary | ICD-10-CM

## 2011-04-09 DIAGNOSIS — I509 Heart failure, unspecified: Secondary | ICD-10-CM

## 2011-04-09 MED ORDER — TRAMADOL HCL 50 MG PO TABS: 50.0000 mg | ORAL_TABLET | Freq: Every evening | ORAL | Status: DC | PRN

## 2011-04-09 NOTE — Assessment & Plan Note (Signed)
Acetaminophen during the day Will have him try tramadol at night prn

## 2011-04-09 NOTE — Progress Notes (Signed)
Subjective:    Patient ID: Richard Chen, male    DOB: 27-Apr-1956, 55 y.o.   MRN: [id number]  HPI Feels his sugars are okay Checks fasting sugars about twice a week Usually in 120's No sig hypoglycemic spells--does get hungry at times  No exercise--though active at work Now at peak season at work Does walk with wife on days off  Still having left knee pain Arthritis per Dr Dean--no surgery planned acetominophen helps some but still bad in bed. Uses this for work and gets along okay Mostly a problem in bed--starts thumping and pounding  No chest pain No SOB No palpitations No sig edema unless he is in car for extended time  Current Outpatient Prescriptions on File Prior to Visit  Medication Sig Dispense Refill  . acetaminophen (TYLENOL) 650 MG CR tablet Take 650 mg by mouth 3 (three) times daily as needed.        [name] allopurinol (ZYLOPRIM) 300 MG tablet Take 300 mg by mouth daily.        [name] amLODipine-benazepril (LOTREL) 10-20 MG per capsule Take 1 capsule by mouth daily.  30 capsule  11  . colchicine 0.6 MG tablet Take 0.6 mg by mouth 3 (three) times daily.        . furosemide (LASIX) 40 MG tablet Take 40 mg by mouth daily.        [name] glyBURIDE (DIABETA) 5 MG tablet Take 5 mg by mouth 2 (two) times daily with a meal.        . Mag Aspart-Potassium Aspart (POTASSIUM & MAGNESIUM ASPARTAT) 250-250 MG CAPS Take by mouth daily.        . metFORMIN (GLUCOPHAGE-XR) 500 MG 24 hr tablet Take 2,000 mg by mouth daily with breakfast.        . Multiple Vitamins-Minerals (MENS MULTI VITAMIN & MINERAL PO) Take by mouth daily.        [name] RHINOCORT AQUA 32 MCG/ACT nasal spray USE 1 SPRAY IN EACH NOSTRIL DAILY  1 Bottle  6  . traMADol (ULTRAM) 50 MG tablet Take 25-50 mg by mouth 3 (three) times daily as needed.        . vardenafil (LEVITRA) 20 MG tablet Take 20 mg by mouth daily as needed.         Past Medical History  Diagnosis Date  . Allergy   . Diabetes mellitus   . Hyperlipidemia   .  Hypertension   . Migraine   . ED (erectile dysfunction)   . CHF (congestive heart failure)   . Gout   . Left inguinal hernia   . Umbilical hernia [id number]    repair    No past surgical history on file.  Family History  Problem Relation Age of Onset  . Coronary artery disease Brother   . Cancer Neg Hx     History   Social History  . Marital Status: Married    Spouse Name: N/A    Number of Children: 1  . Years of Education: N/A   Occupational History  . appliance delivery    Social History Main Topics  . Smoking status: Never Smoker   . Smokeless tobacco: Never Used  . Alcohol Use: Yes     beer  . Drug Use: Not on file  . Sexually Active: Not on file   Other Topics Concern  . Not on file   Social History Narrative   8 siblings--3 have died (2 were homicide, 1 had seizures)   Review of Systems  Weight is stable now No problems sleeping    Objective:   Physical Exam  Constitutional: He appears well-developed and well-nourished. No distress.  Neck: Normal range of motion. Neck supple. No thyromegaly present.  Cardiovascular: Normal rate, regular rhythm, normal heart sounds and intact distal pulses.  Exam reveals no gallop.   No murmur heard.      occ skip beats  Pulmonary/Chest: Effort normal and breath sounds normal. No respiratory distress. He has no wheezes. He has no rales.  Musculoskeletal: Normal range of motion. He exhibits no edema and no tenderness.  Lymphadenopathy:    He has no cervical adenopathy.  Skin: No rash noted.       No ulcers  Psychiatric: He has a normal mood and affect. His behavior is normal. Judgment and thought content normal.          Assessment & Plan:

## 2011-04-09 NOTE — Assessment & Plan Note (Signed)
Stable functional status No changes needed

## 2011-04-09 NOTE — Assessment & Plan Note (Signed)
Was bad last time--may have been secondary to cortisone shot Will recheck  Add januvia if still >9%  Lab Results  Component Value Date   HGBA1C 9.9* 12/14/2010

## 2011-04-09 NOTE — Assessment & Plan Note (Signed)
BP Readings from Last 3 Encounters:  04/09/11 128/60  12/14/10 136/76  11/17/10 148/69   No changes needed Lab Results  Component Value Date   CREATININE 1.5 12/14/2010

## 2011-04-13 MED ORDER — SITAGLIPTIN PHOSPHATE 100 MG PO TABS: 100.0000 mg | ORAL_TABLET | Freq: Every day | ORAL | Status: DC

## 2011-04-13 NOTE — Progress Notes (Signed)
Addended by: Despina Hidden on: 04/13/2011 05:05 PM   Modules accepted: Orders

## 2011-05-30 ENCOUNTER — Other Ambulatory Visit: Payer: Self-pay | Admitting: Internal Medicine

## 2011-07-13 ENCOUNTER — Other Ambulatory Visit: Payer: Self-pay | Admitting: Internal Medicine

## 2011-07-18 ENCOUNTER — Ambulatory Visit (INDEPENDENT_AMBULATORY_CARE_PROVIDER_SITE_OTHER): Payer: 59 | Admitting: Internal Medicine

## 2011-07-18 ENCOUNTER — Encounter: Payer: Self-pay | Admitting: Internal Medicine

## 2011-07-18 VITALS — BP 164/67 | HR 56 | Temp 98.3°F | Ht 73.0 in | Wt 272.0 lb

## 2011-07-18 DIAGNOSIS — I10 Essential (primary) hypertension: Secondary | ICD-10-CM

## 2011-07-18 DIAGNOSIS — E119 Type 2 diabetes mellitus without complications: Secondary | ICD-10-CM

## 2011-07-18 DIAGNOSIS — Z23 Encounter for immunization: Secondary | ICD-10-CM

## 2011-07-18 DIAGNOSIS — M25569 Pain in unspecified knee: Secondary | ICD-10-CM

## 2011-07-18 LAB — HEMOGLOBIN A1C: Hgb A1c MFr Bld: 6.5 % (ref 4.6–6.5)

## 2011-07-18 NOTE — Assessment & Plan Note (Signed)
Ongoing  Has physically demanding job Discussed walking Ice/heat/tylenol

## 2011-07-18 NOTE — Assessment & Plan Note (Signed)
BP Readings from Last 3 Encounters:  07/18/11 164/67  04/09/11 128/60  12/14/10 136/76    I rechecked and 150/70 on right No changes for now No beta blockers due to HR ~60

## 2011-07-18 NOTE — Progress Notes (Signed)
Subjective:    Patient ID: Richard Chen, male    DOB: [birth year], 55 y.o.   MRN: [id number]  HPI Doing okay No problems with the [place] Checks sugars fasting once or twice a week Usually under 130 now No sig hypoglycemic spells but feels low in afternoon esp if he skips lunch (which is unfortunately too often)  Still gets cramps at times Eating a banana may help Ongoing knee pain, esp left one.  Tylenol does help---but will ache at night  No chest pain No SOB Some edema if prolonged time in car  Current Outpatient Prescriptions on File Prior to Visit  Medication Sig Dispense Refill  . acetaminophen (TYLENOL) 650 MG CR tablet Take 650 mg by mouth 3 (three) times daily as needed.        [name] allopurinol (ZYLOPRIM) 300 MG tablet Take 300 mg by mouth daily.        [name] amLODipine-benazepril (LOTREL) 10-20 MG per capsule Take 1 capsule by mouth daily.  30 capsule  11  . Cinnamon 500 MG capsule Take 500 mg by mouth daily.        . colchicine 0.6 MG tablet Take 0.6 mg by mouth 3 (three) times daily.        . furosemide (LASIX) 40 MG tablet Take 40 mg by mouth daily.        [name] glyBURIDE (DIABETA) 5 MG tablet TAKE 1 TABLET BY MOUTH TWICE A DAY  60 tablet  11  . losartan (COZAAR) 100 MG tablet TAKE 1 TABLET DAILY  30 tablet  8  . Mag Aspart-Potassium Aspart (POTASSIUM & MAGNESIUM ASPARTAT) 250-250 MG CAPS Take by mouth daily.        . metFORMIN (GLUCOPHAGE-XR) 500 MG 24 hr tablet Take 2,000 mg by mouth daily with breakfast.        . Multiple Vitamins-Minerals (MENS MULTI VITAMIN & MINERAL PO) Take by mouth daily.        [name] RHINOCORT AQUA 32 MCG/ACT nasal spray USE 1 SPRAY IN EACH NOSTRIL DAILY  1 Bottle  6  . sitaGLIPtan (JANUVIA) 100 MG tablet Take 1 tablet (100 mg total) by mouth daily.  30 tablet  11  . traMADol (ULTRAM) 50 MG tablet Take 1-2 tablets (50-100 mg total) by mouth at bedtime as needed.  60 tablet  1  . vardenafil (LEVITRA) 20 MG tablet Take 20 mg by mouth daily as needed.           Allergies  Allergen Reactions  . Aspirin     Past Medical History  Diagnosis Date  . Allergy   . Diabetes mellitus   . Hyperlipidemia   . Hypertension   . Migraine   . ED (erectile dysfunction)   . CHF (congestive heart failure)   . Gout   . Left inguinal hernia   . Umbilical hernia [id number]    repair    No past surgical history on file.  Family History  Problem Relation Age of Onset  . Coronary artery disease Brother   . Cancer Neg Hx     History   Social History  . Marital Status: Married    Spouse Name: N/A    Number of Children: 1  . Years of Education: N/A   Occupational History  . appliance delivery    Social History Main Topics  . Smoking status: Never Smoker   . Smokeless tobacco: Never Used  . Alcohol Use: Yes     beer  . Drug Use: Not  on file  . Sexually Active: Not on file   Other Topics Concern  . Not on file   Social History Narrative   8 siblings--3 have died (2 were homicide, 1 had seizures)   Review of Systems Sleeps okay Appetite is good Weight is down a few pounds     Objective:   Physical Exam  Constitutional: He appears well-developed and well-nourished. No distress.  Neck: Normal range of motion. Neck supple. No thyromegaly present.  Cardiovascular: Normal rate, regular rhythm, normal heart sounds and intact distal pulses.  Exam reveals no gallop.   No murmur heard. Pulmonary/Chest: Effort normal and breath sounds normal. No respiratory distress. He has no wheezes. He has no rales.  Musculoskeletal: Normal range of motion. He exhibits no edema and no tenderness.  Lymphadenopathy:    He has no cervical adenopathy.  Psychiatric: He has a normal mood and affect. His behavior is normal. Judgment and thought content normal.          Assessment & Plan:

## 2011-07-18 NOTE — Assessment & Plan Note (Signed)
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

## 2011-07-19 ENCOUNTER — Other Ambulatory Visit: Payer: Self-pay | Admitting: Internal Medicine

## 2011-09-05 ENCOUNTER — Telehealth: Payer: Self-pay | Admitting: *Deleted

## 2011-09-05 MED ORDER — FLUTICASONE PROPIONATE 50 MCG/ACT NA SUSP: 2.0000 | Freq: Every day | NASAL | Status: DC

## 2011-09-05 NOTE — Telephone Encounter (Signed)
Pharmacy called asking to change pt's rhinocort script to something else, rhinocort is on back order.

## 2011-09-05 NOTE — Telephone Encounter (Signed)
rx sent to pharmacy by e-script  

## 2011-09-05 NOTE — Telephone Encounter (Signed)
Okay to change to fluticasone 2 sprays each nostril daily 1 year Rx  Make sure with him he hasn't used it and had a problem

## 2011-10-09 ENCOUNTER — Encounter: Payer: Self-pay | Admitting: Internal Medicine

## 2011-10-09 ENCOUNTER — Ambulatory Visit (INDEPENDENT_AMBULATORY_CARE_PROVIDER_SITE_OTHER): Payer: 59 | Admitting: Internal Medicine

## 2011-10-09 VITALS — BP 150/80 | HR 60 | Temp 97.8°F | Ht 73.0 in | Wt 275.0 lb

## 2011-10-09 DIAGNOSIS — Z125 Encounter for screening for malignant neoplasm of prostate: Secondary | ICD-10-CM

## 2011-10-09 DIAGNOSIS — E119 Type 2 diabetes mellitus without complications: Secondary | ICD-10-CM

## 2011-10-09 DIAGNOSIS — I5032 Chronic diastolic (congestive) heart failure: Secondary | ICD-10-CM

## 2011-10-09 DIAGNOSIS — E785 Hyperlipidemia, unspecified: Secondary | ICD-10-CM

## 2011-10-09 DIAGNOSIS — I10 Essential (primary) hypertension: Secondary | ICD-10-CM

## 2011-10-09 DIAGNOSIS — M25569 Pain in unspecified knee: Secondary | ICD-10-CM

## 2011-10-09 DIAGNOSIS — M109 Gout, unspecified: Secondary | ICD-10-CM

## 2011-10-09 DIAGNOSIS — I509 Heart failure, unspecified: Secondary | ICD-10-CM

## 2011-10-09 DIAGNOSIS — Z23 Encounter for immunization: Secondary | ICD-10-CM

## 2011-10-09 LAB — HEPATIC FUNCTION PANEL
AST: 18 U/L (ref 0–37)
Total Bilirubin: 0.7 mg/dL (ref 0.3–1.2)

## 2011-10-09 LAB — PSA: PSA: 0.54 ng/mL (ref 0.10–4.00)

## 2011-10-09 LAB — CBC WITH DIFFERENTIAL/PLATELET
Eosinophils Relative: 2.1 % (ref 0.0–5.0)
Lymphocytes Relative: 42.2 % (ref 12.0–46.0)
MCV: 91.3 fl (ref 78.0–100.0)
Monocytes Absolute: 0.7 10*3/uL (ref 0.1–1.0)
Neutrophils Relative %: 45.5 % (ref 43.0–77.0)
Platelets: 194 10*3/uL (ref 150.0–400.0)
WBC: 6.7 10*3/uL (ref 4.5–10.5)

## 2011-10-09 LAB — BASIC METABOLIC PANEL
BUN: 25 mg/dL — ABNORMAL HIGH (ref 6–23)
Calcium: 9.1 mg/dL (ref 8.4–10.5)
Creatinine, Ser: 1.2 mg/dL (ref 0.4–1.5)
GFR: 78.53 mL/min (ref >60.00)
Glucose, Bld: 94 mg/dL (ref 70–99)
Potassium: 4.1 mEq/L (ref 3.5–5.1)

## 2011-10-09 LAB — MICROALBUMIN / CREATININE URINE RATIO
Creatinine,U: 54.8 mg/dL
Microalb Creat Ratio: 68.6 mg/g — ABNORMAL HIGH (ref 0.0–30.0)
Microalb, Ur: 37.6 mg/dL — ABNORMAL HIGH (ref 0.0–1.9)

## 2011-10-09 LAB — LIPID PANEL
Total CHOL/HDL Ratio: 4
VLDL: 27.4 mg/dL (ref 0.0–40.0)

## 2011-10-09 LAB — LDL CHOLESTEROL, DIRECT: Direct LDL: 134.7 mg/dL

## 2011-10-09 LAB — TSH: TSH: 1.11 u[IU]/mL (ref 0.35–5.50)

## 2011-10-09 MED ORDER — METOPROLOL SUCCINATE ER 25 MG PO TB24: 25.0000 mg | ORAL_TABLET | Freq: Every day | ORAL | Status: DC

## 2011-10-09 MED ORDER — FLUTICASONE PROPIONATE 50 MCG/ACT NA SUSP: 2.0000 | Freq: Every day | NASAL | Status: DC

## 2011-10-09 NOTE — Progress Notes (Signed)
Addended by: Despina Hidden on: 10/09/2011 10:04 AM   Modules accepted: Orders

## 2011-10-09 NOTE — Progress Notes (Signed)
Subjective:    Patient ID: Richard Chen, male    DOB: 1955/12/19, 55 y.o.   MRN: [id number]  HPI Doing well Still having trouble with knee---still carrying washers and driers up stairs, etc No missed work Uses the arthritis acetaminophen tid and that helps Has uses icy hot also  Checks sugars 2-3 times per week Highest 135 lately No hypoglycemic reactions---but will get hungry if he misses meals  No chest pain No SOB No recent edema--now using support socks No palpitations  No gout problems  Current Outpatient Prescriptions on File Prior to Visit  Medication Sig Dispense Refill  . acetaminophen (TYLENOL) 650 MG CR tablet Take 650 mg by mouth 3 (three) times daily as needed.        [name] allopurinol (ZYLOPRIM) 300 MG tablet TAKE 1 TABLET DAILY TO PREVENT THE GOUT  30 tablet  11  . amLODipine-benazepril (LOTREL) 10-20 MG per capsule Take 1 capsule by mouth daily.  30 capsule  11  . Cinnamon 500 MG capsule Take 500 mg by mouth daily.        . colchicine 0.6 MG tablet Take 0.6 mg by mouth 3 (three) times daily.        . furosemide (LASIX) 40 MG tablet Take 40 mg by mouth daily.        [name] glyBURIDE (DIABETA) 5 MG tablet TAKE 1 TABLET BY MOUTH TWICE A DAY  60 tablet  11  . losartan (COZAAR) 100 MG tablet TAKE 1 TABLET DAILY  30 tablet  8  . Mag Aspart-Potassium Aspart (POTASSIUM & MAGNESIUM ASPARTAT) 250-250 MG CAPS Take by mouth daily.        . metFORMIN (GLUCOPHAGE-XR) 500 MG 24 hr tablet Take 2,000 mg by mouth daily with breakfast.        . Multiple Vitamins-Minerals (MENS MULTI VITAMIN & MINERAL PO) Take by mouth daily.        . sitaGLIPtan (JANUVIA) 100 MG tablet Take 1 tablet (100 mg total) by mouth daily.  30 tablet  11  . traMADol (ULTRAM) 50 MG tablet Take 1-2 tablets (50-100 mg total) by mouth at bedtime as needed.  60 tablet  1  . vardenafil (LEVITRA) 20 MG tablet Take 20 mg by mouth daily as needed.          Allergies  Allergen Reactions  . Aspirin     Past Medical  History  Diagnosis Date  . Allergy   . Diabetes mellitus   . Hyperlipidemia   . Hypertension   . Migraine   . ED (erectile dysfunction)   . CHF (congestive heart failure)   . Gout   . Left inguinal hernia   . Umbilical hernia [id number]    repair    No past surgical history on file.  Family History  Problem Relation Age of Onset  . Coronary artery disease Brother   . Cancer Neg Hx     History   Social History  . Marital Status: Married    Spouse Name: N/A    Number of Children: 1  . Years of Education: N/A   Occupational History  . appliance delivery    Social History Main Topics  . Smoking status: Never Smoker   . Smokeless tobacco: Never Used  . Alcohol Use: Yes     beer  . Drug Use: Not on file  . Sexually Active: Not on file   Other Topics Concern  . Not on file   Social History Narrative   8  siblings--3 have died (2 were homicide, 1 had seizures)   Review of Systems Sleeps okay Appetite still large Weight up 3#    Objective:   Physical Exam        Assessment & Plan:

## 2011-10-09 NOTE — Assessment & Plan Note (Signed)
Does okay with the acetaminophen

## 2011-10-09 NOTE — Assessment & Plan Note (Signed)
Seems to be compensated Will add metoprolol since BP is up

## 2011-10-09 NOTE — Assessment & Plan Note (Signed)
BP Readings from Last 3 Encounters:  10/09/11 150/80  07/18/11 164/67  04/09/11 128/60   Not at goal Repeat 158/72 on right Will add low dose beta blocker

## 2011-10-09 NOTE — Assessment & Plan Note (Signed)
Lab Results  Component Value Date   HGBA1C 6.5 07/18/2011   Finally has good control Still sounds good Will check again and urine microal

## 2011-10-09 NOTE — Assessment & Plan Note (Signed)
Lab Results  Component Value Date   LDLCALC 104* 06/19/2010   Discussed goals Will add low dose statin if still >100

## 2011-10-15 ENCOUNTER — Encounter: Payer: Self-pay | Admitting: *Deleted

## 2011-10-15 ENCOUNTER — Telehealth: Payer: Self-pay | Admitting: *Deleted

## 2011-10-15 MED ORDER — PRAVASTATIN SODIUM 20 MG PO TABS: 20.0000 mg | ORAL_TABLET | Freq: Every day | ORAL | Status: DC

## 2011-10-15 NOTE — Telephone Encounter (Signed)
Spoke with patient and advised results letter mailed to patients home address with results. rx sent to pharmacy by e-script

## 2011-10-15 NOTE — Telephone Encounter (Signed)
Message copied by Despina Hidden on Mon Oct 15, 2011  9:29 AM ------      Message from: Viviana Simpler I      Created: Wed Oct 10, 2011  2:43 PM       Please call       Diabetes control is still very good with HgbA1c of 6.8%      The chol is still elevated with total of 222 and LDL or bad chol of 134. Please start pravastatin 20mg  daily (Rx x 1 year) and set up lipid, hepatic in about 6 weeks to recheck      Urine test does show some early signs of diabetic kidney problems so it is good that we added the new BP medication--this should help      There is still a very mild anemia but it is as good as it has been for a couple of years      Liver, prostate and thyroid are normal

## 2011-10-30 ENCOUNTER — Other Ambulatory Visit: Payer: Self-pay | Admitting: *Deleted

## 2011-10-30 MED ORDER — METFORMIN HCL ER 500 MG PO TB24: 2000.0000 mg | ORAL_TABLET | Freq: Every day | ORAL | Status: DC

## 2011-10-30 NOTE — Telephone Encounter (Signed)
Is the sig on this medication correct? Extended release tab 4 times daily?

## 2011-10-30 NOTE — Telephone Encounter (Signed)
He takes 4 at the same time in AM It doesn't come in 1000mg  size

## 2011-10-30 NOTE — Telephone Encounter (Signed)
Ok, rx sent to pharmacy by e-script

## 2011-11-27 ENCOUNTER — Encounter: Payer: Self-pay | Admitting: Internal Medicine

## 2011-11-27 ENCOUNTER — Ambulatory Visit (INDEPENDENT_AMBULATORY_CARE_PROVIDER_SITE_OTHER): Payer: 59 | Admitting: Internal Medicine

## 2011-11-27 VITALS — BP 148/70 | HR 58 | Temp 98.0°F | Ht 73.0 in | Wt 277.0 lb

## 2011-11-27 DIAGNOSIS — E785 Hyperlipidemia, unspecified: Secondary | ICD-10-CM

## 2011-11-27 DIAGNOSIS — E119 Type 2 diabetes mellitus without complications: Secondary | ICD-10-CM

## 2011-11-27 DIAGNOSIS — I10 Essential (primary) hypertension: Secondary | ICD-10-CM

## 2011-11-27 LAB — HEPATIC FUNCTION PANEL
AST: 15 U/L (ref 0–37)
Albumin: 3.1 g/dL — ABNORMAL LOW (ref 3.5–5.2)
Alkaline Phosphatase: 61 U/L (ref 39–117)
Bilirubin, Direct: 0.1 mg/dL (ref 0.0–0.3)
Total Bilirubin: 0.6 mg/dL (ref 0.3–1.2)

## 2011-11-27 LAB — LIPID PANEL
Cholesterol: 165 mg/dL (ref 0–200)
HDL: 52.1 mg/dL (ref >39.00)
VLDL: 15 mg/dL (ref 0.0–40.0)

## 2011-11-27 MED ORDER — AMLODIPINE BESY-BENAZEPRIL HCL 10-40 MG PO CAPS: 1.0000 | ORAL_CAPSULE | Freq: Every day | ORAL | Status: DC

## 2011-11-27 MED ORDER — FUROSEMIDE 40 MG PO TABS: 40.0000 mg | ORAL_TABLET | Freq: Every day | ORAL | Status: DC

## 2011-11-27 MED ORDER — GLYBURIDE 5 MG PO TABS: 5.0000 mg | ORAL_TABLET | Freq: Every day | ORAL | Status: DC

## 2011-11-27 MED ORDER — METFORMIN HCL ER 500 MG PO TB24: 2000.0000 mg | ORAL_TABLET | Freq: Every day | ORAL | Status: DC

## 2011-11-27 NOTE — Progress Notes (Signed)
Subjective:    Patient ID: Richard Chen, male    DOB: 12-17-1955, 56 y.o.   MRN: [id number]  HPI Doing okay in general  Has had some middle of the night hypoglycemia Has to eat some some candy and that helps  Still has some knee aching  Checks BP occ but only if he feels bad (like with hypoglycemic attacks) 120/80 is typical for him No headaches No chest pain No SOB No dizziness or syncope  No myalgias or stomach trouble with statin  Current Outpatient Prescriptions on File Prior to Visit  Medication Sig Dispense Refill  . acetaminophen (TYLENOL) 650 MG CR tablet Take 650 mg by mouth 3 (three) times daily as needed.        [name] allopurinol (ZYLOPRIM) 300 MG tablet TAKE 1 TABLET DAILY TO PREVENT THE GOUT  30 tablet  11  . amLODipine-benazepril (LOTREL) 10-20 MG per capsule Take 1 capsule by mouth daily.  30 capsule  11  . Cinnamon 500 MG capsule Take 500 mg by mouth daily.        . colchicine 0.6 MG tablet Take 0.6 mg by mouth 3 (three) times daily.        . fluticasone (FLONASE) 50 MCG/ACT nasal spray Place 2 sprays into the nose daily.  16 g  11  . furosemide (LASIX) 40 MG tablet Take 40 mg by mouth daily.        [name] losartan (COZAAR) 100 MG tablet TAKE 1 TABLET DAILY  30 tablet  8  . Mag Aspart-Potassium Aspart (POTASSIUM & MAGNESIUM ASPARTAT) 250-250 MG CAPS Take by mouth daily.        . metFORMIN (GLUCOPHAGE-XR) 500 MG 24 hr tablet Take 4 tablets (2,000 mg total) by mouth daily with breakfast.  120 tablet  11  . metoprolol succinate (TOPROL-XL) 25 MG 24 hr tablet Take 1 tablet (25 mg total) by mouth daily.  30 tablet  12  . Multiple Vitamins-Minerals (MENS MULTI VITAMIN & MINERAL PO) Take by mouth daily.        . pravastatin (PRAVACHOL) 20 MG tablet Take 1 tablet (20 mg total) by mouth daily.  90 tablet  3  . sitaGLIPtan (JANUVIA) 100 MG tablet Take 1 tablet (100 mg total) by mouth daily.  30 tablet  11  . traMADol (ULTRAM) 50 MG tablet Take 1-2 tablets (50-100 mg total) by mouth  at bedtime as needed.  60 tablet  1  . vardenafil (LEVITRA) 20 MG tablet Take 20 mg by mouth daily as needed.          Allergies  Allergen Reactions  . Aspirin     Past Medical History  Diagnosis Date  . Allergy   . Diabetes mellitus   . Hyperlipidemia   . Hypertension   . Migraine   . ED (erectile dysfunction)   . CHF (congestive heart failure)   . Gout   . Left inguinal hernia   . Umbilical hernia [id number]    repair    No past surgical history on file.  Family History  Problem Relation Age of Onset  . Coronary artery disease Brother   . Cancer Neg Hx     History   Social History  . Marital Status: Married    Spouse Name: N/A    Number of Children: 1  . Years of Education: N/A   Occupational History  . appliance delivery    Social History Main Topics  . Smoking status: Never Smoker   . Smokeless  tobacco: Never Used  . Alcohol Use: Yes     beer  . Drug Use: Not on file  . Sexually Active: Not on file   Other Topics Concern  . Not on file   Social History Narrative   8 siblings--3 have died (2 were homicide, 1 had seizures)   Review of Systems Appetite is okay Weight fairly stable Sleeps well in general     Objective:   Physical Exam  Constitutional: He appears well-developed and well-nourished. No distress.  Neck: Normal range of motion. Neck supple. No thyromegaly present.  Cardiovascular: Normal rate, regular rhythm and normal heart sounds.  Exam reveals no gallop.   No murmur heard. Pulmonary/Chest: Effort normal and breath sounds normal. No respiratory distress. He has no wheezes. He has no rales.  Musculoskeletal: He exhibits no edema and no tenderness.  Lymphadenopathy:    He has no cervical adenopathy.  Psychiatric: He has a normal mood and affect. His behavior is normal. Judgment and thought content normal.          Assessment & Plan:

## 2011-11-27 NOTE — Assessment & Plan Note (Signed)
Good control Lab Results  Component Value Date   HGBA1C 6.8* 10/09/2011   Some night hypoglycemia Will decrease glyburide to 1 daily

## 2011-11-27 NOTE — Patient Instructions (Addendum)
Please take only 1 glyburide daily instead of 2 Please change the amlodipine/benazapril to the 10mg /40mg  dose when your current supply runs out.

## 2011-11-27 NOTE — Assessment & Plan Note (Signed)
No problems with the statin Will check labs

## 2011-11-27 NOTE — Assessment & Plan Note (Signed)
BP Readings from Last 3 Encounters:  11/27/11 148/70  10/09/11 150/80  07/18/11 164/67   Slightly better but still not at goal Mild proteinuria as well Will increase the benazapril

## 2012-01-18 ENCOUNTER — Encounter: Payer: 59 | Admitting: Internal Medicine

## 2012-02-04 ENCOUNTER — Ambulatory Visit (INDEPENDENT_AMBULATORY_CARE_PROVIDER_SITE_OTHER): Payer: 59 | Admitting: Internal Medicine

## 2012-02-04 ENCOUNTER — Encounter: Payer: Self-pay | Admitting: Internal Medicine

## 2012-02-04 VITALS — BP 136/86 | HR 65 | Temp 98.6°F | Ht 73.0 in | Wt 272.0 lb

## 2012-02-04 DIAGNOSIS — R609 Edema, unspecified: Secondary | ICD-10-CM

## 2012-02-04 NOTE — Patient Instructions (Addendum)
Please wear support socks daily when you are working  Please get colonscopy report from [facility]

## 2012-02-04 NOTE — Assessment & Plan Note (Signed)
Seems to be from venous insufficiency No signs of more serious process and always gone in Am   Discussed using support socks daily

## 2012-02-04 NOTE — Progress Notes (Signed)
Subjective:    Patient ID: Richard Chen, male    DOB: Jun 02, 1956, 56 y.o.   MRN: [id number]  HPI Pulled muscle in his back a couple of weeks ago Did finally improve Then noted swelling in calves from knees to feet Better in the morning but worsens as the day goes on No sig pain  Is taking the furosemide daily Does wear the support socks sometimes---if he knows he has a long drive  No SOB No chest pain  Current Outpatient Prescriptions on File Prior to Visit  Medication Sig Dispense Refill  . acetaminophen (TYLENOL) 650 MG CR tablet Take 650 mg by mouth 3 (three) times daily as needed.        [name] allopurinol (ZYLOPRIM) 300 MG tablet TAKE 1 TABLET DAILY TO PREVENT THE GOUT  30 tablet  11  . amLODipine-benazepril (LOTREL) 10-40 MG per capsule Take 1 capsule by mouth daily.  90 capsule  3  . Cinnamon 500 MG capsule Take 500 mg by mouth daily.        . colchicine 0.6 MG tablet Take 0.6 mg by mouth 3 (three) times daily.        . fluticasone (FLONASE) 50 MCG/ACT nasal spray Place 2 sprays into the nose daily.  16 g  11  . furosemide (LASIX) 40 MG tablet Take 1 tablet (40 mg total) by mouth daily.  90 tablet  3  . glyBURIDE (DIABETA) 5 MG tablet Take 1 tablet (5 mg total) by mouth daily with breakfast.  90 tablet  3  . losartan (COZAAR) 100 MG tablet TAKE 1 TABLET DAILY  30 tablet  8  . Mag Aspart-Potassium Aspart (POTASSIUM & MAGNESIUM ASPARTAT) 250-250 MG CAPS Take by mouth daily.        . metFORMIN (GLUCOPHAGE-XR) 500 MG 24 hr tablet Take 4 tablets (2,000 mg total) by mouth daily with breakfast.  360 tablet  3  . metoprolol succinate (TOPROL-XL) 25 MG 24 hr tablet Take 1 tablet (25 mg total) by mouth daily.  30 tablet  12  . Multiple Vitamins-Minerals (MENS MULTI VITAMIN & MINERAL PO) Take by mouth daily.        . pravastatin (PRAVACHOL) 20 MG tablet Take 1 tablet (20 mg total) by mouth daily.  90 tablet  3  . sitaGLIPtan (JANUVIA) 100 MG tablet Take 1 tablet (100 mg total) by mouth  daily.  30 tablet  11  . traMADol (ULTRAM) 50 MG tablet Take 1-2 tablets (50-100 mg total) by mouth at bedtime as needed.  60 tablet  1  . vardenafil (LEVITRA) 20 MG tablet Take 20 mg by mouth daily as needed.          Allergies  Allergen Reactions  . Aspirin     Past Medical History  Diagnosis Date  . Allergy   . Diabetes mellitus   . Hyperlipidemia   . Hypertension   . Migraine   . ED (erectile dysfunction)   . CHF (congestive heart failure)   . Gout   . Left inguinal hernia   . Umbilical hernia [id number]    repair    No past surgical history on file.  Family History  Problem Relation Age of Onset  . Coronary artery disease Brother   . Cancer Neg Hx     History   Social History  . Marital Status: Married    Spouse Name: N/A    Number of Children: 1  . Years of Education: N/A   Occupational History  .  appliance delivery    Social History Main Topics  . Smoking status: Never Smoker   . Smokeless tobacco: Never Used  . Alcohol Use: Yes     beer  . Drug Use: Not on file  . Sexually Active: Not on file   Other Topics Concern  . Not on file   Social History Narrative   8 siblings--3 have died (2 were homicide, 1 had seizures)     Review of Systems Sleeping fine No PND or orthopnea    Objective:   Physical Exam  Constitutional: He appears well-developed and well-nourished. No distress.  Neck: Normal range of motion. Neck supple.  Cardiovascular: Normal rate, regular rhythm, normal heart sounds and intact distal pulses.  Exam reveals no gallop.   No murmur heard. Pulmonary/Chest: Effort normal and breath sounds normal. No respiratory distress. He has no wheezes. He has no rales.  Musculoskeletal: He exhibits edema.       1+ pitting edema feet and calves  Lymphadenopathy:    He has no cervical adenopathy.          Assessment & Plan:

## 2012-04-09 ENCOUNTER — Encounter: Payer: Self-pay | Admitting: Internal Medicine

## 2012-04-09 ENCOUNTER — Ambulatory Visit (INDEPENDENT_AMBULATORY_CARE_PROVIDER_SITE_OTHER): Payer: 59 | Admitting: Internal Medicine

## 2012-04-09 VITALS — BP 148/80 | HR 55 | Temp 98.1°F | Ht 73.0 in | Wt 265.0 lb

## 2012-04-09 DIAGNOSIS — I10 Essential (primary) hypertension: Secondary | ICD-10-CM

## 2012-04-09 DIAGNOSIS — E785 Hyperlipidemia, unspecified: Secondary | ICD-10-CM

## 2012-04-09 DIAGNOSIS — E119 Type 2 diabetes mellitus without complications: Secondary | ICD-10-CM

## 2012-04-09 DIAGNOSIS — I5032 Chronic diastolic (congestive) heart failure: Secondary | ICD-10-CM

## 2012-04-09 LAB — BASIC METABOLIC PANEL
CO2: 26 mEq/L (ref 19–32)
Calcium: 8.7 mg/dL (ref 8.4–10.5)
Chloride: 111 mEq/L (ref 96–112)
Creatinine, Ser: 1.5 mg/dL (ref 0.4–1.5)
Glucose, Bld: 125 mg/dL — ABNORMAL HIGH (ref 70–99)

## 2012-04-09 NOTE — Assessment & Plan Note (Signed)
Reasonable volume status Okay on the lasix

## 2012-04-09 NOTE — Assessment & Plan Note (Signed)
Lab Results  Component Value Date   LDLCALC 98 11/27/2011   At goal Labs next time

## 2012-04-09 NOTE — Progress Notes (Signed)
Subjective:    Patient ID: Richard Chen, male    DOB: 01-30-1956, 56 y.o.   MRN: [id number]  HPI Doing well  Checks sugars ~ 3times per week Usually under 120 fasting Rarely over 130  No chest pain No SOB No change in exercise tolerance---very active at work Hasn't ben walking recently No headaches Occ edema---better with brief elevation or support socks  No myalgias or muscle aching Still on statin Occ pain in left knee  Current Outpatient Prescriptions on File Prior to Visit  Medication Sig Dispense Refill  . acetaminophen (TYLENOL) 650 MG CR tablet Take 650 mg by mouth 3 (three) times daily as needed.        [name] allopurinol (ZYLOPRIM) 300 MG tablet TAKE 1 TABLET DAILY TO PREVENT THE GOUT  30 tablet  11  . amLODipine-benazepril (LOTREL) 10-40 MG per capsule Take 1 capsule by mouth daily.  90 capsule  3  . Cinnamon 500 MG capsule Take 500 mg by mouth daily.        . colchicine 0.6 MG tablet Take 0.6 mg by mouth 2 (two) times daily as needed. For gout      . fluticasone (FLONASE) 50 MCG/ACT nasal spray Place 2 sprays into the nose daily.  16 g  11  . furosemide (LASIX) 40 MG tablet Take 1 tablet (40 mg total) by mouth daily.  90 tablet  3  . glyBURIDE (DIABETA) 5 MG tablet Take 1 tablet (5 mg total) by mouth daily with breakfast.  90 tablet  3  . losartan (COZAAR) 100 MG tablet TAKE 1 TABLET DAILY  30 tablet  8  . Mag Aspart-Potassium Aspart (POTASSIUM & MAGNESIUM ASPARTAT) 250-250 MG CAPS Take by mouth daily.        . metFORMIN (GLUCOPHAGE-XR) 500 MG 24 hr tablet Take 4 tablets (2,000 mg total) by mouth daily with breakfast.  360 tablet  3  . metoprolol succinate (TOPROL-XL) 25 MG 24 hr tablet Take 1 tablet (25 mg total) by mouth daily.  30 tablet  12  . Multiple Vitamins-Minerals (MENS MULTI VITAMIN & MINERAL PO) Take by mouth daily.        . pravastatin (PRAVACHOL) 20 MG tablet Take 1 tablet (20 mg total) by mouth daily.  90 tablet  3  . sitaGLIPtan (JANUVIA) 100 MG tablet  Take 1 tablet (100 mg total) by mouth daily.  30 tablet  11  . traMADol (ULTRAM) 50 MG tablet Take 1-2 tablets (50-100 mg total) by mouth at bedtime as needed.  60 tablet  1  . vardenafil (LEVITRA) 20 MG tablet Take 20 mg by mouth daily as needed.          Allergies  Allergen Reactions  . Aspirin     Past Medical History  Diagnosis Date  . Allergy   . Diabetes mellitus   . Hyperlipidemia   . Hypertension   . Migraine   . ED (erectile dysfunction)   . CHF (congestive heart failure)   . Gout   . Left inguinal hernia   . Umbilical hernia [id number]    repair    No past surgical history on file.  Family History  Problem Relation Age of Onset  . Coronary artery disease Brother   . Cancer Neg Hx     History   Social History  . Marital Status: Married    Spouse Name: N/A    Number of Children: 1  . Years of Education: N/A   Occupational History  .  appliance delivery    Social History Main Topics  . Smoking status: Never Smoker   . Smokeless tobacco: Never Used  . Alcohol Use: Yes     beer  . Drug Use: Not on file  . Sexually Active: Not on file   Other Topics Concern  . Not on file   Social History Narrative   8 siblings--3 have died (2 were homicide, 1 had seizures)   Review of Systems Sleeps well Appetite okay Weight down 7#--has tried to be careful Had some bad lower incisors removed---now with partial and can eat easier     Objective:   Physical Exam  Constitutional: He appears well-developed and well-nourished. No distress.  Neck: Normal range of motion. Neck supple. No thyromegaly present.  Cardiovascular: Normal rate, regular rhythm, normal heart sounds and intact distal pulses.  Exam reveals no gallop.   No murmur heard.      Bigeminal rhythm at times  Pulmonary/Chest: Effort normal and breath sounds normal. No respiratory distress. He has no wheezes. He has no rales.  Musculoskeletal: He exhibits no edema and no tenderness.  Lymphadenopathy:     He has no cervical adenopathy.  Psychiatric: He has a normal mood and affect. His behavior is normal.          Assessment & Plan:

## 2012-04-09 NOTE — Assessment & Plan Note (Signed)
BP Readings from Last 3 Encounters:  04/09/12 148/80  02/04/12 136/86  11/27/11 148/70   Still not quite at goal Has lost some weight Will continue current meds---hope for improvements in BP with lifestyle work

## 2012-04-09 NOTE — Assessment & Plan Note (Signed)
Lab Results  Component Value Date   HGBA1C 6.8* 10/09/2011   Has had good control Will check again

## 2012-04-10 ENCOUNTER — Encounter: Payer: Self-pay | Admitting: *Deleted

## 2012-04-20 ENCOUNTER — Other Ambulatory Visit: Payer: Self-pay | Admitting: Internal Medicine

## 2012-05-22 HISTORY — PX: OTHER SURGICAL HISTORY: SHX169

## 2012-05-31 DIAGNOSIS — S329XXA Fracture of unspecified parts of lumbosacral spine and pelvis, initial encounter for closed fracture: Secondary | ICD-10-CM | POA: Insufficient documentation

## 2012-05-31 DIAGNOSIS — S2243XA Multiple fractures of ribs, bilateral, initial encounter for closed fracture: Secondary | ICD-10-CM | POA: Insufficient documentation

## 2012-05-31 DIAGNOSIS — T1490XA Injury, unspecified, initial encounter: Secondary | ICD-10-CM | POA: Insufficient documentation

## 2012-05-31 DIAGNOSIS — S36113A Laceration of liver, unspecified degree, initial encounter: Secondary | ICD-10-CM | POA: Insufficient documentation

## 2012-05-31 DIAGNOSIS — S42002A Fracture of unspecified part of left clavicle, initial encounter for closed fracture: Secondary | ICD-10-CM | POA: Insufficient documentation

## 2012-05-31 DIAGNOSIS — S36039A Unspecified laceration of spleen, initial encounter: Secondary | ICD-10-CM | POA: Insufficient documentation

## 2012-05-31 HISTORY — PX: OTHER SURGICAL HISTORY: SHX169

## 2012-06-06 DIAGNOSIS — G8911 Acute pain due to trauma: Secondary | ICD-10-CM | POA: Insufficient documentation

## 2012-06-08 DIAGNOSIS — E669 Obesity, unspecified: Secondary | ICD-10-CM | POA: Insufficient documentation

## 2012-06-08 DIAGNOSIS — J45909 Unspecified asthma, uncomplicated: Secondary | ICD-10-CM | POA: Insufficient documentation

## 2012-06-09 DIAGNOSIS — Z930 Tracheostomy status: Secondary | ICD-10-CM | POA: Insufficient documentation

## 2012-06-26 DIAGNOSIS — J9601 Acute respiratory failure with hypoxia: Secondary | ICD-10-CM | POA: Insufficient documentation

## 2012-06-26 DIAGNOSIS — J9690 Respiratory failure, unspecified, unspecified whether with hypoxia or hypercapnia: Secondary | ICD-10-CM | POA: Insufficient documentation

## 2012-07-14 ENCOUNTER — Telehealth: Payer: Self-pay

## 2012-07-14 ENCOUNTER — Encounter: Payer: Self-pay | Admitting: Internal Medicine
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
## 2012-07-23 DIAGNOSIS — S42102A Fracture of unspecified part of scapula, left shoulder, initial encounter for closed fracture: Secondary | ICD-10-CM | POA: Insufficient documentation

## 2012-08-11 ENCOUNTER — Telehealth: Payer: Self-pay

## 2012-08-11 NOTE — Telephone Encounter (Signed)
Pt's wife request accu chek smart view test strips sent to [place]; pt is still in rehab but wants to get ready for pts return home and pt now has accu chek nano glucometer.Please advise.

## 2012-08-11 NOTE — Telephone Encounter (Signed)
Okay to send Rx  Should be daily if he is not on insulin

## 2012-08-12 MED ORDER — GLUCOSE BLOOD VI STRP: ORAL_STRIP | Status: DC

## 2012-08-12 NOTE — Telephone Encounter (Signed)
Spoke with patient's wife and advised results rx sent to pharmacy by e-script  

## 2012-08-20 ENCOUNTER — Encounter: Payer: Self-pay | Admitting: Internal Medicine

## 2012-08-22 ENCOUNTER — Encounter: Payer: Self-pay | Admitting: Internal Medicine

## 2012-08-22 ENCOUNTER — Ambulatory Visit (INDEPENDENT_AMBULATORY_CARE_PROVIDER_SITE_OTHER): Payer: 59 | Admitting: Internal Medicine

## 2012-08-22 VITALS — BP 112/72 | HR 84 | Temp 97.5°F | Wt 222.8 lb

## 2012-08-22 DIAGNOSIS — T07XXXA Unspecified multiple injuries, initial encounter: Secondary | ICD-10-CM | POA: Insufficient documentation

## 2012-08-22 DIAGNOSIS — E119 Type 2 diabetes mellitus without complications: Secondary | ICD-10-CM

## 2012-08-22 DIAGNOSIS — E785 Hyperlipidemia, unspecified: Secondary | ICD-10-CM

## 2012-08-22 DIAGNOSIS — I10 Essential (primary) hypertension: Secondary | ICD-10-CM

## 2012-08-22 DIAGNOSIS — Z23 Encounter for immunization: Secondary | ICD-10-CM

## 2012-08-22 LAB — CBC WITH DIFFERENTIAL/PLATELET
Basophils Relative: 0.4 % (ref 0.0–3.0)
Eosinophils Relative: 5.1 % — ABNORMAL HIGH (ref 0.0–5.0)
Lymphocytes Relative: 38.1 % (ref 12.0–46.0)
MCV: 90.4 fl (ref 78.0–100.0)
Monocytes Absolute: 0.4 10*3/uL (ref 0.1–1.0)
Monocytes Relative: 10.2 % (ref 3.0–12.0)
Neutrophils Relative %: 46.2 % (ref 43.0–77.0)
RBC: 3.72 Mil/uL — ABNORMAL LOW (ref 4.22–5.81)
WBC: 4.3 10*3/uL — ABNORMAL LOW (ref 4.5–10.5)

## 2012-08-22 LAB — HEMOGLOBIN A1C: Hgb A1c MFr Bld: 8.6 % — ABNORMAL HIGH (ref 4.6–6.5)

## 2012-08-22 NOTE — Assessment & Plan Note (Signed)
Will recheck the labs

## 2012-08-22 NOTE — Assessment & Plan Note (Signed)
Will check labs Stop the novolog if A1c okay
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
## 2012-08-22 NOTE — Assessment & Plan Note (Signed)
BP Readings from Last 3 Encounters:  08/22/12 112/72  04/09/12 148/80  02/04/12 136/86   Sounds like CNS side effects Will stop the clonidine Discussed possible brief rebound

## 2012-08-22 NOTE — Patient Instructions (Signed)
Please start miralax daily to help bowels. Increase to twice a day if once is not working  Stop the gabapentin and clonidine

## 2012-08-22 NOTE — Assessment & Plan Note (Signed)
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

## 2012-08-25 ENCOUNTER — Telehealth: Payer: Self-pay

## 2012-08-25 ENCOUNTER — Other Ambulatory Visit (INDEPENDENT_AMBULATORY_CARE_PROVIDER_SITE_OTHER): Payer: 59

## 2012-08-25 DIAGNOSIS — E7800 Pure hypercholesterolemia, unspecified: Secondary | ICD-10-CM

## 2012-08-25 DIAGNOSIS — I10 Essential (primary) hypertension: Secondary | ICD-10-CM

## 2012-08-25 LAB — BASIC METABOLIC PANEL
Calcium: 9.4 mg/dL (ref 8.4–10.5)
GFR: 58.95 mL/min — ABNORMAL LOW (ref >60.00)
Potassium: 4.5 mEq/L (ref 3.5–5.1)
Sodium: 135 mEq/L (ref 135–145)

## 2012-08-25 LAB — HEPATIC FUNCTION PANEL
Albumin: 3.4 g/dL — ABNORMAL LOW (ref 3.5–5.2)
Alkaline Phosphatase: 180 U/L — ABNORMAL HIGH (ref 39–117)
Bilirubin, Direct: 0 mg/dL (ref 0.0–0.3)
Total Bilirubin: 0.7 mg/dL (ref 0.3–1.2)

## 2012-08-25 LAB — LIPID PANEL
HDL: 54.4 mg/dL (ref >39.00)
Total CHOL/HDL Ratio: 4
Triglycerides: 124 mg/dL (ref 0.0–149.0)
VLDL: 24.8 mg/dL (ref 0.0–40.0)

## 2012-08-25 NOTE — Telephone Encounter (Signed)
Pt seen 08/22/12; pt did not sleep at all Sat or Sun night; pt not eating; drinking ensure. Pt is diabetic, this AM FBS was 370; pt took Novolog insulin 8 units before breakfast. Pt is weak. Pt going to [facility] for PT this morning and then coming to [facility] for lab test. Should Ms Livers continue  Ensure and should pt restart Gabapentin and Clonidine HCL? Please advise.

## 2012-08-25 NOTE — Telephone Encounter (Signed)
Discussed with wife Some labs done Diabetes control not good so we will keep up with the novolog before meals Try glucerna instead of ensure Restart gabapentin but take all 300mg  at bedtime Will review at [place] again

## 2012-08-26 ENCOUNTER — Encounter: Payer: Self-pay | Admitting: *Deleted

## 2012-08-27 ENCOUNTER — Encounter: Payer: Self-pay | Admitting: Family Medicine

## 2012-08-27 ENCOUNTER — Telehealth: Payer: Self-pay | Admitting: Internal Medicine

## 2012-08-27 ENCOUNTER — Ambulatory Visit (INDEPENDENT_AMBULATORY_CARE_PROVIDER_SITE_OTHER): Payer: 59 | Admitting: Family Medicine

## 2012-08-27 VITALS — BP 124/76 | HR 102 | Temp 97.7°F | Ht 73.0 in | Wt 213.2 lb

## 2012-08-27 DIAGNOSIS — R8290 Unspecified abnormal findings in urine: Secondary | ICD-10-CM

## 2012-08-27 DIAGNOSIS — E119 Type 2 diabetes mellitus without complications: Secondary | ICD-10-CM

## 2012-08-27 DIAGNOSIS — R82998 Other abnormal findings in urine: Secondary | ICD-10-CM

## 2012-08-27 DIAGNOSIS — R5383 Other fatigue: Secondary | ICD-10-CM

## 2012-08-27 DIAGNOSIS — R5381 Other malaise: Secondary | ICD-10-CM

## 2012-08-27 LAB — POCT URINALYSIS DIPSTICK
Bilirubin, UA: NEGATIVE
Glucose, UA: 2000
Ketones, UA: NEGATIVE
Leukocytes, UA: NEGATIVE
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
## 2012-08-27 NOTE — Patient Instructions (Addendum)
Go up on novolog to 10 units before each meal for any sugars above 300  (otherwise follow your usual sliding scale)  Call tomorrow afternoon with some blood sugar numbers so we know if this starts to help  Stay as active as your body will let you be  If fever or any other symptoms- please let us know  Please give a urine specimen on the way out

## 2012-08-27 NOTE — Telephone Encounter (Signed)
I will see him then

## 2012-08-28 ENCOUNTER — Telehealth: Payer: Self-pay

## 2012-08-28 LAB — POCT UA - MICROSCOPIC ONLY: Yeast, UA: 0

## 2012-08-28 NOTE — Assessment & Plan Note (Signed)
Mildly abn ua on micro - uti is possible (but no symptoms) Sent for cx - pending result ? If a uti could be the reason for his inc glucose reading

## 2012-08-28 NOTE — Telephone Encounter (Signed)
Pt saw Dr Glori Bickers 08/27/12 and pt was to call back BS readings today. Pts wife called 08/27/12  5 pm BS 331;  FBS today 277; BS at 12 noon 391 after eating lunch; pt had salad.Please advise. pts request call back if need to do anything differently.  Last night pt was restless and having cramps in hands. Feet felt like pt had shoes and socks on but was barefooted. pts wife wonders about neuropathy. What can pt take for cramping in hands.[place].

## 2012-08-28 NOTE — Assessment & Plan Note (Signed)
Suspect this may be from high sugar  Pt did have mildly abn ua on micro- urine sent for cx

## 2012-08-29 NOTE — Telephone Encounter (Signed)
Discussed with wife Sugars some better last night and this morning Tends to peak at lunch (~400) Discussed that we will probably start long acting insulin at upcoming appt  For cramps and other pain, should use tramadol and then oxycodone if needed He hasn't been taking any analgesics

## 2012-09-01 ENCOUNTER — Other Ambulatory Visit: Payer: Self-pay | Admitting: Internal Medicine

## 2012-09-09 ENCOUNTER — Ambulatory Visit (INDEPENDENT_AMBULATORY_CARE_PROVIDER_SITE_OTHER): Payer: 59 | Admitting: Internal Medicine

## 2012-09-09 ENCOUNTER — Encounter: Payer: Self-pay | Admitting: Internal Medicine

## 2012-09-09 ENCOUNTER — Telehealth: Payer: Self-pay

## 2012-09-09 VITALS — BP 140/70 | HR 91 | Temp 98.1°F | Wt 209.0 lb

## 2012-09-09 DIAGNOSIS — IMO0001 Reserved for inherently not codable concepts without codable children: Secondary | ICD-10-CM

## 2012-09-09 MED ORDER — INSULIN SYRINGES (DISPOSABLE) U-100 1 ML MISC: 2.0000 [IU] | Freq: Three times a day (TID) | Status: DC | PRN

## 2012-09-09 MED ORDER — INSULIN GLARGINE 100 UNIT/ML ~~LOC~~ SOLN: 15.0000 [IU] | Freq: Every day | SUBCUTANEOUS | Status: DC

## 2012-09-09 MED ORDER — GLUCOSE BLOOD VI STRP: ORAL_STRIP | Status: DC

## 2012-09-09 MED ORDER — TRAMADOL HCL 50 MG PO TABS: 50.0000 mg | ORAL_TABLET | Freq: Every evening | ORAL | Status: DC | PRN

## 2012-09-09 NOTE — Telephone Encounter (Signed)
rx sent to pharmacy by e-script  

## 2012-09-09 NOTE — Patient Instructions (Signed)
Start the long acting lantus at 15 units a day. You can take this in the morning or evening at your preference. If your fasting sugars remain over 160 after a week, increase the lantus by 3 units every 3 days until they are regularly under 160

## 2012-09-09 NOTE — Telephone Encounter (Signed)
Pt got insulin from [facility] but no syringes or needles given. Please advise.

## 2012-09-09 NOTE — Assessment & Plan Note (Signed)
Is better but still high Will start lantus and hopefully he won't need much novolog Stopping zyprexa---not sure why he was on this and I didn't know that he was on this at the last visit
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
## 2012-09-21 ENCOUNTER — Encounter: Payer: Self-pay | Admitting: Internal Medicine

## 2012-10-14 ENCOUNTER — Ambulatory Visit (INDEPENDENT_AMBULATORY_CARE_PROVIDER_SITE_OTHER): Payer: 59 | Admitting: Internal Medicine

## 2012-10-14 ENCOUNTER — Encounter: Payer: Self-pay | Admitting: Internal Medicine

## 2012-10-14 VITALS — BP 128/70 | HR 84 | Temp 98.1°F | Wt 203.0 lb

## 2012-10-14 DIAGNOSIS — E785 Hyperlipidemia, unspecified: Secondary | ICD-10-CM

## 2012-10-14 DIAGNOSIS — I5032 Chronic diastolic (congestive) heart failure: Secondary | ICD-10-CM

## 2012-10-14 DIAGNOSIS — IMO0001 Reserved for inherently not codable concepts without codable children: Secondary | ICD-10-CM

## 2012-10-14 DIAGNOSIS — I10 Essential (primary) hypertension: Secondary | ICD-10-CM

## 2012-10-14 DIAGNOSIS — Z0000 Encounter for general adult medical examination without abnormal findings: Secondary | ICD-10-CM | POA: Insufficient documentation

## 2012-10-14 DIAGNOSIS — T07XXXA Unspecified multiple injuries, initial encounter: Secondary | ICD-10-CM

## 2012-10-14 NOTE — Assessment & Plan Note (Signed)
Continues to improve May not be feasible to return to his past work, discussed looking for alternatives

## 2012-10-14 NOTE — Assessment & Plan Note (Signed)
With fluid off, doesn't seem apparent now

## 2012-10-14 NOTE — Assessment & Plan Note (Signed)
Lab Results  Component Value Date   LDLCALC 98 11/27/2011   At goal Recheck next time

## 2012-10-14 NOTE — Assessment & Plan Note (Signed)
Marked improvement since being off the zyprexa Off all insulin Will recheck at his next appt

## 2012-10-14 NOTE — Assessment & Plan Note (Signed)
BP Readings from Last 3 Encounters:  10/14/12 128/70  09/09/12 140/70  08/27/12 124/76   Good control No changes needed Lab Results  Component Value Date   CREATININE 1.6* 08/25/2012

## 2012-10-14 NOTE — Assessment & Plan Note (Signed)
UTD with imms and colon Discussed PSA---normal last year-will defer to next year Trying to be more active

## 2012-10-14 NOTE — Patient Instructions (Signed)
Please try off the gabapentin. If you get pain in your feet or hands that is bad, you can restart it
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
## 2012-11-23 ENCOUNTER — Encounter: Payer: Self-pay | Admitting: Internal Medicine

## 2012-11-30 ENCOUNTER — Other Ambulatory Visit: Payer: Self-pay | Admitting: Internal Medicine

## 2013-01-08 ENCOUNTER — Other Ambulatory Visit: Payer: Self-pay | Admitting: Internal Medicine

## 2013-01-12 ENCOUNTER — Encounter: Payer: Self-pay | Admitting: Internal Medicine

## 2013-01-12 ENCOUNTER — Ambulatory Visit (INDEPENDENT_AMBULATORY_CARE_PROVIDER_SITE_OTHER): Payer: BC Managed Care – PPO | Admitting: Internal Medicine

## 2013-01-12 VITALS — BP 130/70 | HR 59 | Temp 97.5°F | Ht 73.0 in | Wt 214.0 lb

## 2013-01-12 DIAGNOSIS — F39 Unspecified mood [affective] disorder: Secondary | ICD-10-CM | POA: Insufficient documentation

## 2013-01-12 DIAGNOSIS — T07XXXA Unspecified multiple injuries, initial encounter: Secondary | ICD-10-CM

## 2013-01-12 DIAGNOSIS — F329 Major depressive disorder, single episode, unspecified: Secondary | ICD-10-CM

## 2013-01-12 DIAGNOSIS — E119 Type 2 diabetes mellitus without complications: Secondary | ICD-10-CM

## 2013-01-12 LAB — HEMOGLOBIN A1C: Hgb A1c MFr Bld: 5.9 % (ref 4.6–6.5)

## 2013-01-12 MED ORDER — CITALOPRAM HYDROBROMIDE 20 MG PO TABS: 20.0000 mg | ORAL_TABLET | Freq: Every day | ORAL | Status: DC

## 2013-01-12 NOTE — Assessment & Plan Note (Signed)
Has been going up but still seems to be controlled Richard Chen has been a problem due to cost---- will consider changing to glipizide  Increase the metformin to 1000mg  daily

## 2013-01-12 NOTE — Patient Instructions (Signed)
Please start the citalopram--- cut the first 2 in half and take 1/2 tab for the first 4 days. Then go up to a full tab every day  Increase the metformin to 1000mg  every morning
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
## 2013-01-12 NOTE — Assessment & Plan Note (Signed)
Due to disability, inability to work, etc Will start Rx and have early follow up

## 2013-01-13 ENCOUNTER — Other Ambulatory Visit: Payer: Self-pay | Admitting: Internal Medicine

## 2013-01-30 ENCOUNTER — Other Ambulatory Visit: Payer: Self-pay | Admitting: Internal Medicine

## 2013-02-12 ENCOUNTER — Encounter: Payer: Self-pay | Admitting: Internal Medicine

## 2013-02-12 ENCOUNTER — Ambulatory Visit (INDEPENDENT_AMBULATORY_CARE_PROVIDER_SITE_OTHER): Payer: BC Managed Care – PPO | Admitting: Internal Medicine

## 2013-02-12 VITALS — BP 112/60 | HR 63 | Temp 98.1°F | Wt 211.0 lb

## 2013-02-12 DIAGNOSIS — F329 Major depressive disorder, single episode, unspecified: Secondary | ICD-10-CM

## 2013-02-12 NOTE — Assessment & Plan Note (Signed)
Much better Discussed possible need to increase the dose if the effect wanes Needs 9-12 months rx from now
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
## 2013-03-26 LAB — HM DIABETES EYE EXAM

## 2013-03-29 ENCOUNTER — Encounter: Payer: Self-pay | Admitting: Internal Medicine

## 2013-04-13 ENCOUNTER — Other Ambulatory Visit: Payer: Self-pay | Admitting: Internal Medicine

## 2013-04-15 ENCOUNTER — Encounter: Payer: Self-pay | Admitting: Internal Medicine

## 2013-04-15 ENCOUNTER — Ambulatory Visit (INDEPENDENT_AMBULATORY_CARE_PROVIDER_SITE_OTHER): Payer: BC Managed Care – PPO | Admitting: Internal Medicine

## 2013-04-15 VITALS — BP 110/60 | HR 65 | Temp 97.8°F | Wt 221.0 lb

## 2013-04-15 DIAGNOSIS — F329 Major depressive disorder, single episode, unspecified: Secondary | ICD-10-CM
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
## 2013-04-15 NOTE — Assessment & Plan Note (Signed)
Still better than he was but no seems to be in a rut of disability Now with vocational rehab starting May have part time job  No increase in meds Needs to start exercising regularly If worsens, would increase the med

## 2013-04-24 ENCOUNTER — Other Ambulatory Visit: Payer: Self-pay | Admitting: Internal Medicine

## 2013-05-01 ENCOUNTER — Other Ambulatory Visit: Payer: Self-pay | Admitting: Internal Medicine

## 2013-05-30 ENCOUNTER — Other Ambulatory Visit: Payer: Self-pay | Admitting: Internal Medicine

## 2013-06-14 ENCOUNTER — Encounter: Payer: Self-pay | Admitting: Internal Medicine

## 2013-06-15 MED ORDER — MECLIZINE HCL 25 MG PO TABS: 25.0000 mg | ORAL_TABLET | Freq: Three times a day (TID) | ORAL | Status: DC | PRN

## 2013-06-15 NOTE — Telephone Encounter (Signed)
rx sent to pharmacy by e-script  

## 2013-07-03 ENCOUNTER — Other Ambulatory Visit: Payer: Self-pay | Admitting: *Deleted

## 2013-07-03 MED ORDER — GLUCOSE BLOOD VI STRP: ORAL_STRIP | Status: DC

## 2013-07-06 ENCOUNTER — Telehealth: Payer: Self-pay

## 2013-07-06 NOTE — Telephone Encounter (Signed)
Baxter Flattery with [facility] faxed a request for diabetic supplies and request did Dr Silvio Pate received request and is it ready to be faxed back if received.Please advise.

## 2013-07-07 NOTE — Telephone Encounter (Signed)
Spoke with Baxter Flattery and advised results

## 2013-07-16 ENCOUNTER — Ambulatory Visit (INDEPENDENT_AMBULATORY_CARE_PROVIDER_SITE_OTHER): Payer: BC Managed Care – PPO

## 2013-07-16 DIAGNOSIS — Z23 Encounter for immunization: Secondary | ICD-10-CM

## 2013-07-30 ENCOUNTER — Encounter: Payer: Self-pay | Admitting: Internal Medicine

## 2013-07-31 ENCOUNTER — Other Ambulatory Visit: Payer: Self-pay | Admitting: Internal Medicine

## 2013-08-28 ENCOUNTER — Other Ambulatory Visit: Payer: Self-pay | Admitting: Internal Medicine

## 2013-10-16 ENCOUNTER — Other Ambulatory Visit: Payer: Self-pay | Admitting: Internal Medicine

## 2013-10-26 ENCOUNTER — Ambulatory Visit (INDEPENDENT_AMBULATORY_CARE_PROVIDER_SITE_OTHER): Payer: BC Managed Care – PPO | Admitting: Internal Medicine

## 2013-10-26 ENCOUNTER — Encounter: Payer: Self-pay | Admitting: *Deleted

## 2013-10-26 ENCOUNTER — Encounter: Payer: Self-pay | Admitting: Internal Medicine

## 2013-10-26 VITALS — BP 140/68 | HR 66 | Temp 98.0°F | Ht 73.0 in | Wt 245.0 lb

## 2013-10-26 DIAGNOSIS — Z0000 Encounter for general adult medical examination without abnormal findings: Secondary | ICD-10-CM

## 2013-10-26 DIAGNOSIS — F329 Major depressive disorder, single episode, unspecified: Secondary | ICD-10-CM

## 2013-10-26 DIAGNOSIS — E119 Type 2 diabetes mellitus without complications: Secondary | ICD-10-CM

## 2013-10-26 DIAGNOSIS — E785 Hyperlipidemia, unspecified: Secondary | ICD-10-CM

## 2013-10-26 DIAGNOSIS — I5032 Chronic diastolic (congestive) heart failure: Secondary | ICD-10-CM

## 2013-10-26 DIAGNOSIS — I10 Essential (primary) hypertension: Secondary | ICD-10-CM

## 2013-10-26 LAB — HM DIABETES FOOT EXAM

## 2013-10-26 NOTE — Assessment & Plan Note (Signed)
Due for labs If LDL still up, will restart a statin (was on pravastatin but will try atorvastatin)

## 2013-10-26 NOTE — Assessment & Plan Note (Signed)
Hopefully still has acceptable control

## 2013-10-26 NOTE — Patient Instructions (Signed)

## 2013-10-26 NOTE — Assessment & Plan Note (Signed)
BP Readings from Last 3 Encounters:  10/26/13 140/68  04/15/13 110/60  02/12/13 112/60   Reasonable control
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
## 2013-10-26 NOTE — Assessment & Plan Note (Signed)
Compensated Despite weight gain, it is not fluid

## 2013-10-26 NOTE — Assessment & Plan Note (Signed)
Mood is better with the vocational rehab--getting out of the house Will continue medication

## 2013-10-26 NOTE — Assessment & Plan Note (Signed)
Healthy but has really let himself go Gave info on exercise

## 2013-10-26 NOTE — Progress Notes (Signed)
Pre-visit discussion using our clinic review tool. No additional management support is needed unless otherwise documented below in the visit note.  

## 2013-10-27 ENCOUNTER — Other Ambulatory Visit: Payer: Self-pay | Admitting: *Deleted

## 2013-10-27 ENCOUNTER — Encounter: Payer: Self-pay | Admitting: Internal Medicine

## 2013-10-27 LAB — BASIC METABOLIC PANEL
BUN: 27 mg/dL — ABNORMAL HIGH (ref 6–23)
CHLORIDE: 107 meq/L (ref 96–112)
CO2: 26 meq/L (ref 19–32)
CREATININE: 1.5 mg/dL (ref 0.4–1.5)
Calcium: 9.1 mg/dL (ref 8.4–10.5)
GFR: 62.39 mL/min (ref >60.00)
Glucose, Bld: 105 mg/dL — ABNORMAL HIGH (ref 70–99)
Potassium: 4.5 mEq/L (ref 3.5–5.1)
SODIUM: 140 meq/L (ref 135–145)

## 2013-10-27 LAB — CBC WITH DIFFERENTIAL/PLATELET
Basophils Absolute: 0 10*3/uL (ref 0.0–0.1)
Basophils Relative: 0.5 % (ref 0.0–3.0)
EOS PCT: 3.4 % (ref 0.0–5.0)
Eosinophils Absolute: 0.2 10*3/uL (ref 0.0–0.7)
HEMATOCRIT: 36.1 % — AB (ref 39.0–52.0)
Hemoglobin: 11.7 g/dL — ABNORMAL LOW (ref 13.0–17.0)
LYMPHS ABS: 2.8 10*3/uL (ref 0.7–4.0)
Lymphocytes Relative: 48.6 % — ABNORMAL HIGH (ref 12.0–46.0)
MCHC: 32.3 g/dL (ref 30.0–36.0)
MCV: 90.3 fl (ref 78.0–100.0)
MONOS PCT: 9.4 % (ref 3.0–12.0)
Monocytes Absolute: 0.5 10*3/uL (ref 0.1–1.0)
Neutro Abs: 2.2 10*3/uL (ref 1.4–7.7)
Neutrophils Relative %: 38.1 % — ABNORMAL LOW (ref 43.0–77.0)
Platelets: 160 10*3/uL (ref 150.0–400.0)
RBC: 4 Mil/uL — ABNORMAL LOW (ref 4.22–5.81)
RDW: 15.9 % — AB (ref 11.5–14.6)
WBC: 5.8 10*3/uL (ref 4.5–10.5)

## 2013-10-27 LAB — LIPID PANEL
CHOL/HDL RATIO: 5
Cholesterol: 256 mg/dL — ABNORMAL HIGH (ref 0–200)
HDL: 50.4 mg/dL (ref >39.00)
Triglycerides: 191 mg/dL — ABNORMAL HIGH (ref 0.0–149.0)
VLDL: 38.2 mg/dL (ref 0.0–40.0)

## 2013-10-27 LAB — TSH: TSH: 1.21 u[IU]/mL (ref 0.35–5.50)

## 2013-10-27 LAB — HEPATIC FUNCTION PANEL
ALBUMIN: 3.7 g/dL (ref 3.5–5.2)
ALK PHOS: 95 U/L (ref 39–117)
ALT: 21 U/L (ref 0–53)
AST: 18 U/L (ref 0–37)
Bilirubin, Direct: 0.1 mg/dL (ref 0.0–0.3)
TOTAL PROTEIN: 7 g/dL (ref 6.0–8.3)
Total Bilirubin: 0.4 mg/dL (ref 0.3–1.2)

## 2013-10-27 LAB — HEMOGLOBIN A1C: Hgb A1c MFr Bld: 7.7 % — ABNORMAL HIGH (ref 4.6–6.5)

## 2013-10-27 LAB — LDL CHOLESTEROL, DIRECT: Direct LDL: 166.5 mg/dL

## 2013-10-27 LAB — PSA: PSA: 0.44 ng/mL (ref 0.10–4.00)

## 2013-10-27 MED ORDER — ATORVASTATIN CALCIUM 20 MG PO TABS: 20.0000 mg | ORAL_TABLET | Freq: Every day | ORAL | Status: DC

## 2013-10-29 ENCOUNTER — Encounter: Payer: Self-pay | Admitting: Internal Medicine

## 2013-11-03 ENCOUNTER — Other Ambulatory Visit: Payer: Self-pay | Admitting: Internal Medicine

## 2013-11-29 ENCOUNTER — Encounter: Payer: Self-pay | Admitting: Internal Medicine

## 2013-12-02 ENCOUNTER — Other Ambulatory Visit: Payer: Self-pay | Admitting: Internal Medicine

## 2013-12-03 ENCOUNTER — Other Ambulatory Visit: Payer: BC Managed Care – PPO

## 2013-12-04 ENCOUNTER — Other Ambulatory Visit: Payer: Self-pay | Admitting: Internal Medicine

## 2013-12-04 DIAGNOSIS — E785 Hyperlipidemia, unspecified: Secondary | ICD-10-CM

## 2013-12-10 ENCOUNTER — Other Ambulatory Visit (INDEPENDENT_AMBULATORY_CARE_PROVIDER_SITE_OTHER): Payer: BC Managed Care – PPO

## 2013-12-10 DIAGNOSIS — E785 Hyperlipidemia, unspecified: Secondary | ICD-10-CM

## 2013-12-11 LAB — LIPID PANEL
CHOL/HDL RATIO: 3
Cholesterol: 135 mg/dL (ref 0–200)
HDL: 45.4 mg/dL (ref >39.00)
LDL Cholesterol: 72 mg/dL (ref 0–99)
Triglycerides: 90 mg/dL (ref 0.0–149.0)
VLDL: 18 mg/dL (ref 0.0–40.0)

## 2013-12-11 LAB — HEPATIC FUNCTION PANEL
ALBUMIN: 3.7 g/dL (ref 3.5–5.2)
ALT: 52 U/L (ref 0–53)
AST: 29 U/L (ref 0–37)
Alkaline Phosphatase: 90 U/L (ref 39–117)
Bilirubin, Direct: 0 mg/dL (ref 0.0–0.3)
Total Bilirubin: 0.4 mg/dL (ref 0.3–1.2)
Total Protein: 7.5 g/dL (ref 6.0–8.3)

## 2014-01-07 ENCOUNTER — Other Ambulatory Visit: Payer: Self-pay | Admitting: Internal Medicine

## 2014-02-03 ENCOUNTER — Other Ambulatory Visit: Payer: Self-pay | Admitting: Internal Medicine

## 2014-02-24 ENCOUNTER — Other Ambulatory Visit: Payer: Self-pay | Admitting: Internal Medicine

## 2014-04-17 ENCOUNTER — Other Ambulatory Visit: Payer: Self-pay | Admitting: Internal Medicine

## 2014-04-26 ENCOUNTER — Ambulatory Visit (INDEPENDENT_AMBULATORY_CARE_PROVIDER_SITE_OTHER): Payer: BC Managed Care – PPO | Admitting: Internal Medicine

## 2014-04-26 ENCOUNTER — Encounter: Payer: Self-pay | Admitting: Internal Medicine

## 2014-04-26 VITALS — BP 148/66 | HR 65 | Temp 98.3°F | Wt 256.0 lb

## 2014-04-26 DIAGNOSIS — I5032 Chronic diastolic (congestive) heart failure: Secondary | ICD-10-CM

## 2014-04-26 DIAGNOSIS — E785 Hyperlipidemia, unspecified: Secondary | ICD-10-CM

## 2014-04-26 DIAGNOSIS — J019 Acute sinusitis, unspecified: Secondary | ICD-10-CM | POA: Insufficient documentation

## 2014-04-26 DIAGNOSIS — E119 Type 2 diabetes mellitus without complications: Secondary | ICD-10-CM

## 2014-04-26 DIAGNOSIS — I10 Essential (primary) hypertension: Secondary | ICD-10-CM

## 2014-04-26 LAB — HEMOGLOBIN A1C: Hgb A1c MFr Bld: 10.1 % — ABNORMAL HIGH (ref 4.6–6.5)

## 2014-04-26 MED ORDER — AMOXICILLIN 500 MG PO TABS: 1000.0000 mg | ORAL_TABLET | Freq: Two times a day (BID) | ORAL | Status: DC

## 2014-04-26 NOTE — Assessment & Plan Note (Signed)
BP Readings from Last 3 Encounters:  04/26/14 148/66  10/26/13 140/68  04/15/13 110/60   BP up with his weight Discussed lifestyle--will need to add med next time if not back down

## 2014-04-26 NOTE — Patient Instructions (Addendum)
Please start the exercise at [facility] as you have planned.  DASH Eating Plan DASH stands for "Dietary Approaches to Stop Hypertension." The DASH eating plan is a healthy eating plan that has been shown to reduce high blood pressure (hypertension). Additional health benefits may include reducing the risk of type 2 diabetes mellitus, heart disease, and stroke. The DASH eating plan may also help with weight loss. WHAT DO I NEED TO KNOW ABOUT THE DASH EATING PLAN? For the DASH eating plan, you will follow these general guidelines:  Choose foods with a percent daily value for sodium of less than 5% (as listed on the food label).  Use salt-free seasonings or herbs instead of table salt or sea salt.  Check with your health care provider or pharmacist before using salt substitutes.  Eat lower-sodium products, often labeled as "lower sodium" or "no salt added."  Eat fresh foods.  Eat more vegetables, fruits, and low-fat dairy products.  Choose whole grains. Look for the word "whole" as the first word in the ingredient list.  Choose fish and skinless chicken or [place] more often than red meat. Limit fish, poultry, and meat to 6 oz (170 g) each day.  Limit sweets, desserts, sugars, and sugary drinks.  Choose heart-healthy fats.  Limit cheese to 1 oz (28 g) per day.  Eat more home-cooked food and less restaurant, buffet, and fast food.  Limit fried foods.  Cook foods using methods other than frying.  Limit canned vegetables. If you do use them, rinse them well to decrease the sodium.  When eating at a restaurant, ask that your food be prepared with less salt, or no salt if possible. WHAT FOODS CAN I EAT? Seek help from a dietitian for individual calorie needs. Grains Whole grain or whole wheat bread. Brown rice. Whole grain or whole wheat pasta. Quinoa, bulgur, and whole grain cereals. Low-sodium cereals. Corn or whole wheat flour tortillas. Whole grain cornbread. Whole grain  crackers. Low-sodium crackers. Vegetables Fresh or frozen vegetables (raw, steamed, roasted, or grilled). Low-sodium or reduced-sodium tomato and vegetable juices. Low-sodium or reduced-sodium tomato sauce and paste. Low-sodium or reduced-sodium canned vegetables.  Fruits All fresh, canned (in natural juice), or frozen fruits. Meat and Other Protein Products Ground beef (85% or leaner), grass-fed beef, or beef trimmed of fat. Skinless chicken or [place]. Ground chicken or [place]. Pork trimmed of fat. All fish and seafood. Eggs. Dried beans, peas, or lentils. Unsalted nuts and seeds. Unsalted canned beans. Dairy Low-fat dairy products, such as skim or 1% milk, 2% or reduced-fat cheeses, low-fat ricotta or cottage cheese, or plain low-fat yogurt. Low-sodium or reduced-sodium cheeses. Fats and Oils Tub margarines without trans fats. Light or reduced-fat mayonnaise and salad dressings (reduced sodium). Avocado. Safflower, olive, or canola oils. Natural peanut or almond butter. Other Unsalted popcorn and pretzels. The items listed above may not be a complete list of recommended foods or beverages. Contact your dietitian for more options. WHAT FOODS ARE NOT RECOMMENDED? Grains White bread. White pasta. White rice. Refined cornbread. Bagels and croissants. Crackers that contain trans fat. Vegetables Creamed or fried vegetables. Vegetables in a cheese sauce. Regular canned vegetables. Regular canned tomato sauce and paste. Regular tomato and vegetable juices. Fruits Dried fruits. Canned fruit in light or heavy syrup. Fruit juice. Meat and Other Protein Products Fatty cuts of meat. Ribs, chicken wings, bacon, sausage, bologna, salami, chitterlings, fatback, hot dogs, bratwurst, and packaged luncheon meats. Salted nuts and seeds. Canned beans with salt. Dairy Whole or  2% milk, cream, half-and-half, and cream cheese. Whole-fat or sweetened yogurt. Full-fat cheeses or blue cheese. Nondairy creamers and  whipped toppings. Processed cheese, cheese spreads, or cheese curds. Condiments Onion and garlic salt, seasoned salt, table salt, and sea salt. Canned and packaged gravies. Worcestershire sauce. Tartar sauce. Barbecue sauce. Teriyaki sauce. Soy sauce, including reduced sodium. Steak sauce. Fish sauce. Oyster sauce. Cocktail sauce. Horseradish. Ketchup and mustard. Meat flavorings and tenderizers. Bouillon cubes. Hot sauce. Tabasco sauce. Marinades. Taco seasonings. Relishes. Fats and Oils Butter, stick margarine, lard, shortening, ghee, and bacon fat. Coconut, palm kernel, or palm oils. Regular salad dressings. Other Pickles and olives. Salted popcorn and pretzels. The items listed above may not be a complete list of foods and beverages to avoid. Contact your dietitian for more information. WHERE CAN I FIND MORE INFORMATION? National Heart, Lung, and Blood Institute: travelstabloid.com Document Released: 09/27/2011 Document Revised: 10/13/2013 Document Reviewed: 08/12/2013 [facility] Patient Information 2015 [place], [place]. This information is not intended to replace advice given to you by your health care provider. Make sure you discuss any questions you have with your health care provider.

## 2014-04-26 NOTE — Assessment & Plan Note (Signed)
Doing well on statin.

## 2014-04-26 NOTE — Assessment & Plan Note (Signed)
Weight is up but not fluid Compensated on current regimen
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
## 2014-04-26 NOTE — Addendum Note (Signed)
Addended by: Viviana Simpler I on: 04/26/2014 03:17 PM   Modules accepted: Orders

## 2014-04-26 NOTE — Assessment & Plan Note (Signed)
Control probably poorer Diarrhea with the metformin Will add glipizide if A1c over 8%---or if he needs to change due to the diarrhea

## 2014-04-26 NOTE — Assessment & Plan Note (Signed)
Low level purulent drainage for 2 months Will try amoxil

## 2014-04-27 ENCOUNTER — Telehealth: Payer: Self-pay | Admitting: Internal Medicine

## 2014-04-27 NOTE — Telephone Encounter (Signed)
Relevant patient education assigned to patient using Emmi. ° °

## 2014-04-29 ENCOUNTER — Other Ambulatory Visit: Payer: Self-pay | Admitting: *Deleted

## 2014-04-29 MED ORDER — GLIPIZIDE 5 MG PO TABS: 2.5000 mg | ORAL_TABLET | Freq: Two times a day (BID) | ORAL | Status: DC

## 2014-05-04 ENCOUNTER — Other Ambulatory Visit: Payer: Self-pay | Admitting: Internal Medicine

## 2014-05-25 ENCOUNTER — Other Ambulatory Visit: Payer: Self-pay | Admitting: Internal Medicine

## 2014-05-31 ENCOUNTER — Telehealth: Payer: Self-pay | Admitting: *Deleted

## 2014-05-31 NOTE — Telephone Encounter (Signed)
Form done No charge 

## 2014-05-31 NOTE — Telephone Encounter (Signed)
Patient informed form is ready for pick up and he'll come by to pick it up.

## 2014-05-31 NOTE — Telephone Encounter (Signed)
Please see form on your desk to be filled out, pt dropped this off at the front desk and stated you would know what it was?

## 2014-06-01 ENCOUNTER — Other Ambulatory Visit: Payer: Self-pay | Admitting: Internal Medicine

## 2014-07-14 ENCOUNTER — Ambulatory Visit (INDEPENDENT_AMBULATORY_CARE_PROVIDER_SITE_OTHER): Payer: BC Managed Care – PPO | Admitting: Internal Medicine

## 2014-07-14 ENCOUNTER — Encounter: Payer: Self-pay | Admitting: Internal Medicine

## 2014-07-14 VITALS — BP 140/65 | HR 58 | Temp 98.2°F | Wt 264.0 lb

## 2014-07-14 DIAGNOSIS — E1165 Type 2 diabetes mellitus with hyperglycemia: Secondary | ICD-10-CM

## 2014-07-14 DIAGNOSIS — S069XAS Unspecified intracranial injury with loss of consciousness status unknown, sequela: Secondary | ICD-10-CM | POA: Insufficient documentation

## 2014-07-14 DIAGNOSIS — R413 Other amnesia: Secondary | ICD-10-CM

## 2014-07-14 DIAGNOSIS — Z23 Encounter for immunization: Secondary | ICD-10-CM

## 2014-07-14 DIAGNOSIS — S069X0S Unspecified intracranial injury without loss of consciousness, sequela: Secondary | ICD-10-CM | POA: Insufficient documentation

## 2014-07-14 DIAGNOSIS — J019 Acute sinusitis, unspecified: Secondary | ICD-10-CM

## 2014-07-14 DIAGNOSIS — IMO0001 Reserved for inherently not codable concepts without codable children: Secondary | ICD-10-CM

## 2014-07-14 LAB — CBC WITH DIFFERENTIAL/PLATELET
BASOS PCT: 0.6 % (ref 0.0–3.0)
Basophils Absolute: 0 10*3/uL (ref 0.0–0.1)
EOS PCT: 2.5 % (ref 0.0–5.0)
Eosinophils Absolute: 0.2 10*3/uL (ref 0.0–0.7)
HCT: 37.2 % — ABNORMAL LOW (ref 39.0–52.0)
Hemoglobin: 11.9 g/dL — ABNORMAL LOW (ref 13.0–17.0)
Lymphocytes Relative: 43.4 % (ref 12.0–46.0)
Lymphs Abs: 2.6 10*3/uL (ref 0.7–4.0)
MCHC: 31.9 g/dL (ref 30.0–36.0)
MCV: 90.3 fl (ref 78.0–100.0)
MONO ABS: 0.6 10*3/uL (ref 0.1–1.0)
Monocytes Relative: 9.3 % (ref 3.0–12.0)
Neutro Abs: 2.7 10*3/uL (ref 1.4–7.7)
Neutrophils Relative %: 44.2 % (ref 43.0–77.0)
Platelets: 171 10*3/uL (ref 150.0–400.0)
RBC: 4.12 Mil/uL — AB (ref 4.22–5.81)
RDW: 15.7 % — ABNORMAL HIGH (ref 11.5–15.5)
WBC: 6 10*3/uL (ref 4.0–10.5)

## 2014-07-14 LAB — COMPREHENSIVE METABOLIC PANEL
ALBUMIN: 3.7 g/dL (ref 3.5–5.2)
ALK PHOS: 93 U/L (ref 39–117)
ALT: 23 U/L (ref 0–53)
AST: 19 U/L (ref 0–37)
BILIRUBIN TOTAL: 0.5 mg/dL (ref 0.2–1.2)
BUN: 33 mg/dL — AB (ref 6–23)
CO2: 29 meq/L (ref 19–32)
Calcium: 9.3 mg/dL (ref 8.4–10.5)
Chloride: 105 mEq/L (ref 96–112)
Creatinine, Ser: 1.6 mg/dL — ABNORMAL HIGH (ref 0.4–1.5)
GFR: 56.89 mL/min — AB (ref >60.00)
GLUCOSE: 124 mg/dL — AB (ref 70–99)
Potassium: 4.5 mEq/L (ref 3.5–5.1)
Sodium: 138 mEq/L (ref 135–145)
TOTAL PROTEIN: 7.8 g/dL (ref 6.0–8.3)

## 2014-07-14 LAB — TSH: TSH: 1.1 u[IU]/mL (ref 0.35–4.50)

## 2014-07-14 LAB — VITAMIN B12: Vitamin B-12: 1086 pg/mL — ABNORMAL HIGH (ref 211–911)

## 2014-07-14 LAB — T4, FREE: Free T4: 0.89 ng/dL (ref 0.60–1.60)

## 2014-07-14 MED ORDER — AMOXICILLIN 500 MG PO TABS: 1000.0000 mg | ORAL_TABLET | Freq: Two times a day (BID) | ORAL | Status: DC

## 2014-07-14 NOTE — Addendum Note (Signed)
Addended by: Viviana Simpler I on: 07/14/2014 12:12 PM   Modules accepted: Orders

## 2014-07-14 NOTE — Assessment & Plan Note (Signed)
Weight gain on glipizide but sugars seem better Too soon to recheck

## 2014-07-14 NOTE — Assessment & Plan Note (Signed)
May be causing fatigue Will try course of amoxil

## 2014-07-14 NOTE — Patient Instructions (Signed)
Bring your wife with you for follow up in 1 month
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
## 2014-07-14 NOTE — Progress Notes (Signed)
Pre visit review using our clinic review tool, if applicable. No additional management support is needed unless otherwise documented below in the visit note. 

## 2014-07-14 NOTE — Addendum Note (Signed)
Addended by: Despina Hidden on: 07/14/2014 11:44 AM   Modules accepted: Orders
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
## 2014-07-15 ENCOUNTER — Ambulatory Visit
Admission: RE | Admit: 2014-07-15 | Discharge: 2014-07-15 | Disposition: A | Discharge status: Home / Self Care | Payer: BC Managed Care – PPO | Source: Ambulatory Visit | Attending: Internal Medicine | Admitting: Internal Medicine

## 2014-07-15 ENCOUNTER — Encounter: Payer: Self-pay | Admitting: Internal Medicine

## 2014-07-15 ENCOUNTER — Other Ambulatory Visit: Payer: Self-pay | Admitting: Internal Medicine

## 2014-07-15 DIAGNOSIS — Z77018 Contact with and (suspected) exposure to other hazardous metals: Secondary | ICD-10-CM

## 2014-07-15 DIAGNOSIS — R413 Other amnesia: Secondary | ICD-10-CM

## 2014-07-15 LAB — RPR

## 2014-07-15 LAB — HIV ANTIBODY (ROUTINE TESTING W REFLEX): HIV: NONREACTIVE

## 2014-07-24 ENCOUNTER — Other Ambulatory Visit: Payer: Self-pay | Admitting: Internal Medicine

## 2014-08-12 ENCOUNTER — Ambulatory Visit (INDEPENDENT_AMBULATORY_CARE_PROVIDER_SITE_OTHER): Payer: BC Managed Care – PPO | Admitting: Internal Medicine

## 2014-08-12 ENCOUNTER — Encounter: Payer: Self-pay | Admitting: Internal Medicine

## 2014-08-12 VITALS — BP 146/76 | HR 58 | Temp 97.7°F | Wt 266.0 lb

## 2014-08-12 DIAGNOSIS — IMO0002 Reserved for concepts with insufficient information to code with codable children: Secondary | ICD-10-CM

## 2014-08-12 DIAGNOSIS — E1165 Type 2 diabetes mellitus with hyperglycemia: Secondary | ICD-10-CM

## 2014-08-12 DIAGNOSIS — S069X9S Unspecified intracranial injury with loss of consciousness of unspecified duration, sequela: Principal | ICD-10-CM

## 2014-08-12 DIAGNOSIS — F321 Major depressive disorder, single episode, moderate: Secondary | ICD-10-CM

## 2014-08-12 DIAGNOSIS — S069XAS Unspecified intracranial injury with loss of consciousness status unknown, sequela: Secondary | ICD-10-CM

## 2014-08-12 DIAGNOSIS — F0390 Unspecified dementia without behavioral disturbance: Secondary | ICD-10-CM

## 2014-08-12 DIAGNOSIS — F0280 Dementia in other diseases classified elsewhere without behavioral disturbance: Secondary | ICD-10-CM

## 2014-08-12 NOTE — Assessment & Plan Note (Signed)
Hopefully better Will change glipizide to 5mg  daily

## 2014-08-12 NOTE — Assessment & Plan Note (Signed)
Frustrated by [employer], etc Discussed regular exercise but not pushing too much

## 2014-08-12 NOTE — Assessment & Plan Note (Signed)
Hopefully will not be progressive Discussed secondary prevention ASA, statin, BP and sugar control

## 2014-08-12 NOTE — Progress Notes (Signed)
Pre visit review using our clinic review tool, if applicable. No additional management support is needed unless otherwise documented below in the visit note. 
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
## 2014-08-13 LAB — HEMOGLOBIN A1C: Hgb A1c MFr Bld: 8.3 % — ABNORMAL HIGH (ref 4.6–6.5)

## 2014-08-24 ENCOUNTER — Other Ambulatory Visit: Payer: Self-pay | Admitting: Internal Medicine

## 2014-09-25 ENCOUNTER — Encounter: Payer: Self-pay | Admitting: Internal Medicine

## 2014-10-16 ENCOUNTER — Other Ambulatory Visit: Payer: Self-pay | Admitting: Internal Medicine

## 2014-10-19 ENCOUNTER — Other Ambulatory Visit: Payer: Self-pay | Admitting: Internal Medicine

## 2014-11-01 ENCOUNTER — Encounter: Payer: BC Managed Care – PPO | Admitting: Internal Medicine

## 2014-11-02 ENCOUNTER — Other Ambulatory Visit: Payer: Self-pay | Admitting: Internal Medicine

## 2014-11-21 ENCOUNTER — Other Ambulatory Visit: Payer: Self-pay | Admitting: Internal Medicine

## 2014-12-08 ENCOUNTER — Other Ambulatory Visit: Payer: Self-pay

## 2014-12-08 MED ORDER — ATORVASTATIN CALCIUM 20 MG PO TABS: 20.0000 mg | ORAL_TABLET | Freq: Every day | ORAL | Status: DC

## 2014-12-08 MED ORDER — METFORMIN HCL ER 500 MG PO TB24: 2000.0000 mg | ORAL_TABLET | Freq: Every day | ORAL | Status: DC

## 2014-12-08 NOTE — Addendum Note (Signed)
Addended by: Modena Nunnery on: 12/08/2014 11:33 AM   Modules accepted: Orders

## 2014-12-21 LAB — HM DIABETES EYE EXAM

## 2014-12-26 ENCOUNTER — Encounter: Payer: Self-pay | Admitting: Internal Medicine

## 2014-12-27 ENCOUNTER — Other Ambulatory Visit: Payer: Self-pay

## 2014-12-27 MED ORDER — CITALOPRAM HYDROBROMIDE 20 MG PO TABS: 20.0000 mg | ORAL_TABLET | Freq: Every day | ORAL | Status: DC

## 2014-12-29 ENCOUNTER — Telehealth: Payer: Self-pay

## 2014-12-29 NOTE — Telephone Encounter (Signed)
Mrs Schwieterman called and requested all pts refills to go to [facility] [address] since the closing of the [facility] in [place]. Spoke with Charlena Cross at [address] and [facility] [address] will [facility] [place] to transfer refills to [facility]. Mrs Lutman voiced understanding.

## 2015-01-03 ENCOUNTER — Telehealth: Payer: Self-pay | Admitting: Internal Medicine

## 2015-01-03 NOTE — Telephone Encounter (Signed)
Okay to fax form then scan

## 2015-01-03 NOTE — Telephone Encounter (Signed)
Pt dropped off Disability Forms for completion. Pt requests they be faxed to [phone] as soon as completed, ppw left on CMA DS desk

## 2015-01-03 NOTE — Telephone Encounter (Signed)
I faxed form to [place] and notified patient form was faxed.

## 2015-01-03 NOTE — Telephone Encounter (Signed)
On your desk

## 2015-01-10 ENCOUNTER — Other Ambulatory Visit: Payer: Self-pay | Admitting: *Deleted

## 2015-01-10 MED ORDER — COLCHICINE 0.6 MG PO TABS: 0.6000 mg | ORAL_TABLET | Freq: Two times a day (BID) | ORAL | Status: DC | PRN

## 2015-01-10 MED ORDER — ATORVASTATIN CALCIUM 20 MG PO TABS: 20.0000 mg | ORAL_TABLET | Freq: Every day | ORAL | Status: DC

## 2015-02-07 ENCOUNTER — Other Ambulatory Visit: Payer: Self-pay | Admitting: *Deleted

## 2015-02-07 ENCOUNTER — Encounter: Payer: BC Managed Care – PPO | Admitting: Internal Medicine

## 2015-02-07 MED ORDER — FUROSEMIDE 40 MG PO TABS: 40.0000 mg | ORAL_TABLET | Freq: Every day | ORAL | Status: DC

## 2015-02-18 ENCOUNTER — Encounter: Payer: Self-pay | Admitting: Internal Medicine

## 2015-02-18 ENCOUNTER — Ambulatory Visit (INDEPENDENT_AMBULATORY_CARE_PROVIDER_SITE_OTHER): Payer: Medicare Other | Admitting: Internal Medicine

## 2015-02-18 VITALS — BP 148/80 | HR 54 | Temp 98.0°F | Ht 73.0 in | Wt 270.0 lb

## 2015-02-18 DIAGNOSIS — F0280 Dementia in other diseases classified elsewhere without behavioral disturbance: Secondary | ICD-10-CM

## 2015-02-18 DIAGNOSIS — F324 Major depressive disorder, single episode, in partial remission: Secondary | ICD-10-CM

## 2015-02-18 DIAGNOSIS — N183 Chronic kidney disease, stage 3 unspecified: Secondary | ICD-10-CM

## 2015-02-18 DIAGNOSIS — E1165 Type 2 diabetes mellitus with hyperglycemia: Secondary | ICD-10-CM

## 2015-02-18 DIAGNOSIS — M1000 Idiopathic gout, unspecified site: Secondary | ICD-10-CM

## 2015-02-18 DIAGNOSIS — E1129 Type 2 diabetes mellitus with other diabetic kidney complication: Secondary | ICD-10-CM | POA: Diagnosis not present

## 2015-02-18 DIAGNOSIS — IMO0002 Reserved for concepts with insufficient information to code with codable children: Secondary | ICD-10-CM

## 2015-02-18 DIAGNOSIS — Z0000 Encounter for general adult medical examination without abnormal findings: Secondary | ICD-10-CM | POA: Diagnosis not present

## 2015-02-18 DIAGNOSIS — S069X9S Unspecified intracranial injury with loss of consciousness of unspecified duration, sequela: Secondary | ICD-10-CM

## 2015-02-18 DIAGNOSIS — F0390 Unspecified dementia without behavioral disturbance: Secondary | ICD-10-CM

## 2015-02-18 DIAGNOSIS — I5032 Chronic diastolic (congestive) heart failure: Secondary | ICD-10-CM

## 2015-02-18 DIAGNOSIS — S069XAS Unspecified intracranial injury with loss of consciousness status unknown, sequela: Secondary | ICD-10-CM

## 2015-02-18 LAB — CBC WITH DIFFERENTIAL/PLATELET
BASOS PCT: 0.6 % (ref 0.0–3.0)
Basophils Absolute: 0 10*3/uL (ref 0.0–0.1)
Eosinophils Absolute: 0.1 10*3/uL (ref 0.0–0.7)
Eosinophils Relative: 1.7 % (ref 0.0–5.0)
HEMATOCRIT: 35.5 % — AB (ref 39.0–52.0)
HEMOGLOBIN: 11.5 g/dL — AB (ref 13.0–17.0)
LYMPHS PCT: 40.7 % (ref 12.0–46.0)
Lymphs Abs: 2.1 10*3/uL (ref 0.7–4.0)
MCHC: 32.4 g/dL (ref 30.0–36.0)
MCV: 88.1 fl (ref 78.0–100.0)
Monocytes Absolute: 0.5 10*3/uL (ref 0.1–1.0)
Monocytes Relative: 9.5 % (ref 3.0–12.0)
NEUTROS ABS: 2.5 10*3/uL (ref 1.4–7.7)
NEUTROS PCT: 47.5 % (ref 43.0–77.0)
Platelets: 174 10*3/uL (ref 150.0–400.0)
RBC: 4.03 Mil/uL — ABNORMAL LOW (ref 4.22–5.81)
RDW: 15.3 % (ref 11.5–15.5)
WBC: 5.2 10*3/uL (ref 4.0–10.5)

## 2015-02-18 LAB — COMPREHENSIVE METABOLIC PANEL
ALBUMIN: 3 g/dL — AB (ref 3.5–5.2)
ALK PHOS: 104 U/L (ref 39–117)
ALT: 24 U/L (ref 0–53)
AST: 18 U/L (ref 0–37)
BUN: 32 mg/dL — ABNORMAL HIGH (ref 6–23)
CO2: 28 mEq/L (ref 19–32)
Calcium: 9.1 mg/dL (ref 8.4–10.5)
Chloride: 107 mEq/L (ref 96–112)
Creatinine, Ser: 1.69 mg/dL — ABNORMAL HIGH (ref 0.40–1.50)
GFR: 53.3 mL/min — ABNORMAL LOW (ref >60.00)
Glucose, Bld: 162 mg/dL — ABNORMAL HIGH (ref 70–99)
POTASSIUM: 4.1 meq/L (ref 3.5–5.1)
Sodium: 139 mEq/L (ref 135–145)
Total Bilirubin: 0.4 mg/dL (ref 0.2–1.2)
Total Protein: 6.4 g/dL (ref 6.0–8.3)

## 2015-02-18 LAB — HM DIABETES FOOT EXAM

## 2015-02-18 LAB — T4, FREE: Free T4: 0.76 ng/dL (ref 0.60–1.60)

## 2015-02-18 LAB — LIPID PANEL
CHOL/HDL RATIO: 3
Cholesterol: 151 mg/dL (ref 0–200)
HDL: 45.4 mg/dL (ref >39.00)
LDL Cholesterol: 85 mg/dL (ref 0–99)
NonHDL: 105.6
Triglycerides: 105 mg/dL (ref 0.0–149.0)
VLDL: 21 mg/dL (ref 0.0–40.0)

## 2015-02-18 LAB — HEMOGLOBIN A1C: Hgb A1c MFr Bld: 9.2 % — ABNORMAL HIGH (ref 4.6–6.5)

## 2015-02-18 NOTE — Assessment & Plan Note (Signed)
I have personally reviewed the Medicare Annual Wellness questionnaire and have noted 1. The patient's medical and social history 2. Their use of alcohol, tobacco or illicit drugs 3. Their current medications and supplements 4. The patient's functional ability including ADL's, fall risks, home safety risks and hearing or visual             impairment. 5. Diet and physical activities 6. Evidence for depression or mood disorders  The patients weight, height, BMI and visual acuity have been recorded in the chart I have made referrals, counseling and provided education to the patient based review of the above and I have provided the pt with a written personalized care plan for preventive services.  I have provided you with a copy of your personalized plan for preventive services. Please take the time to review along with your updated medication list.  Gave information about advanced directives Had pneumovax and takes yearly flu shot Td due 2020 Colonoscopy due 2017 Will defer PSA to next year

## 2015-02-18 NOTE — Assessment & Plan Note (Signed)
Really hasn't been working on lifestyle If A1c over 9%--will restart lantus

## 2015-02-18 NOTE — Assessment & Plan Note (Signed)
Not common Can't afford colchicine Will check uric acid level

## 2015-02-18 NOTE — Assessment & Plan Note (Signed)
Mild edema but seems compensated No med changes needed
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
## 2015-02-18 NOTE — Assessment & Plan Note (Signed)
Improved Partial remission Will continue his citalopram

## 2015-02-18 NOTE — Assessment & Plan Note (Signed)
On ACEI Discussed limiting NSAIDS (no other good choice for gout)

## 2015-02-18 NOTE — Assessment & Plan Note (Signed)
Stable Has significant fatigue after only a few hours of work

## 2015-02-18 NOTE — Progress Notes (Signed)
Pre visit review using our clinic review tool, if applicable. No additional management support is needed unless otherwise documented below in the visit note. 

## 2015-02-27 ENCOUNTER — Encounter: Payer: Self-pay | Admitting: Internal Medicine

## 2015-02-27 ENCOUNTER — Other Ambulatory Visit: Payer: Self-pay | Admitting: Internal Medicine

## 2015-02-28 MED ORDER — ALLOPURINOL 300 MG PO TABS: 300.0000 mg | ORAL_TABLET | Freq: Every day | ORAL | Status: DC

## 2015-02-28 MED ORDER — AMLODIPINE BESY-BENAZEPRIL HCL 10-40 MG PO CAPS: 1.0000 | ORAL_CAPSULE | Freq: Every day | ORAL | Status: DC

## 2015-04-17 ENCOUNTER — Other Ambulatory Visit: Payer: Self-pay | Admitting: Internal Medicine

## 2015-04-18 MED ORDER — METFORMIN HCL ER 500 MG PO TB24: 2000.0000 mg | ORAL_TABLET | Freq: Every day | ORAL | Status: DC

## 2015-05-10 ENCOUNTER — Other Ambulatory Visit: Payer: Self-pay | Admitting: Internal Medicine

## 2015-05-19 ENCOUNTER — Other Ambulatory Visit: Payer: Self-pay | Admitting: Internal Medicine

## 2015-05-19 DIAGNOSIS — E1165 Type 2 diabetes mellitus with hyperglycemia: Principal | ICD-10-CM

## 2015-05-19 DIAGNOSIS — IMO0002 Reserved for concepts with insufficient information to code with codable children: Secondary | ICD-10-CM

## 2015-05-19 DIAGNOSIS — E1129 Type 2 diabetes mellitus with other diabetic kidney complication: Secondary | ICD-10-CM

## 2015-05-25 ENCOUNTER — Other Ambulatory Visit (INDEPENDENT_AMBULATORY_CARE_PROVIDER_SITE_OTHER): Payer: Medicare Other

## 2015-05-25 DIAGNOSIS — E1129 Type 2 diabetes mellitus with other diabetic kidney complication: Secondary | ICD-10-CM

## 2015-05-25 DIAGNOSIS — E1165 Type 2 diabetes mellitus with hyperglycemia: Secondary | ICD-10-CM | POA: Diagnosis not present

## 2015-05-25 DIAGNOSIS — IMO0002 Reserved for concepts with insufficient information to code with codable children: Secondary | ICD-10-CM

## 2015-05-25 LAB — HEMOGLOBIN A1C: HEMOGLOBIN A1C: 8.3 % — AB (ref 4.6–6.5)

## 2015-08-09 ENCOUNTER — Other Ambulatory Visit: Payer: Self-pay | Admitting: Internal Medicine

## 2015-08-21 ENCOUNTER — Encounter: Payer: Self-pay | Admitting: Internal Medicine

## 2015-08-23 ENCOUNTER — Ambulatory Visit (INDEPENDENT_AMBULATORY_CARE_PROVIDER_SITE_OTHER): Payer: Medicare Other | Admitting: Internal Medicine

## 2015-08-23 ENCOUNTER — Encounter: Payer: Self-pay | Admitting: Internal Medicine

## 2015-08-23 VITALS — BP 160/70 | HR 58 | Temp 98.3°F | Wt 271.0 lb

## 2015-08-23 DIAGNOSIS — F324 Major depressive disorder, single episode, in partial remission: Secondary | ICD-10-CM

## 2015-08-23 DIAGNOSIS — IMO0002 Reserved for concepts with insufficient information to code with codable children: Secondary | ICD-10-CM

## 2015-08-23 DIAGNOSIS — Z23 Encounter for immunization: Secondary | ICD-10-CM | POA: Diagnosis not present

## 2015-08-23 DIAGNOSIS — E1121 Type 2 diabetes mellitus with diabetic nephropathy: Secondary | ICD-10-CM | POA: Diagnosis not present

## 2015-08-23 DIAGNOSIS — S069X0S Unspecified intracranial injury without loss of consciousness, sequela: Secondary | ICD-10-CM

## 2015-08-23 DIAGNOSIS — N183 Chronic kidney disease, stage 3 unspecified: Secondary | ICD-10-CM

## 2015-08-23 DIAGNOSIS — E1165 Type 2 diabetes mellitus with hyperglycemia: Secondary | ICD-10-CM | POA: Diagnosis not present

## 2015-08-23 DIAGNOSIS — I5032 Chronic diastolic (congestive) heart failure: Secondary | ICD-10-CM

## 2015-08-23 NOTE — Progress Notes (Signed)
Pre visit review using our clinic review tool, if applicable. No additional management support is needed unless otherwise documented below in the visit note. 

## 2015-08-23 NOTE — Assessment & Plan Note (Addendum)
BP Readings from Last 3 Encounters:  08/23/15 160/70  02/18/15 148/80  08/12/14 146/76   He will work on lifestyle  New med if still up next time  Is on ACEI

## 2015-08-23 NOTE — Assessment & Plan Note (Signed)
Seems to be compensated but BP up Will restart losartan

## 2015-08-23 NOTE — Assessment & Plan Note (Signed)
Chronic cognitive issues and emotional lability Now on disability

## 2015-08-23 NOTE — Addendum Note (Signed)
Addended by: Despina Hidden on: 08/23/2015 11:45 AM   Modules accepted: Orders
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
## 2015-08-23 NOTE — Assessment & Plan Note (Signed)
Slightly better last time--but has slipped Discussed lifestyle Lab Results  Component Value Date   HGBA1C 8.3* 05/25/2015

## 2015-08-23 NOTE — Patient Instructions (Signed)
Please try over the counter imodium on the days you get the cramps and diarrhea.

## 2015-08-23 NOTE — Assessment & Plan Note (Signed)
Ongoing intermittent mood issues Probably needs to continue the med indefinitely

## 2015-10-04 ENCOUNTER — Encounter: Payer: Self-pay | Admitting: Internal Medicine

## 2015-10-04 MED ORDER — POTASSIUM CHLORIDE CRYS ER 20 MEQ PO TBCR: 20.0000 meq | EXTENDED_RELEASE_TABLET | Freq: Every day | ORAL | Status: DC

## 2015-10-04 NOTE — Telephone Encounter (Signed)
Please go ahead and send the order for 30 days and we can refill it if he feels better

## 2015-10-04 NOTE — Telephone Encounter (Signed)
rx sent to pharmacy by e-script  

## 2015-11-06 ENCOUNTER — Other Ambulatory Visit: Payer: Self-pay | Admitting: Internal Medicine

## 2015-11-21 ENCOUNTER — Other Ambulatory Visit: Payer: Self-pay | Admitting: Internal Medicine

## 2016-01-15 ENCOUNTER — Other Ambulatory Visit: Payer: Self-pay | Admitting: Internal Medicine

## 2016-02-03 ENCOUNTER — Other Ambulatory Visit: Payer: Self-pay | Admitting: Internal Medicine

## 2016-02-12 ENCOUNTER — Other Ambulatory Visit: Payer: Self-pay | Admitting: Internal Medicine

## 2016-02-19 ENCOUNTER — Other Ambulatory Visit: Payer: Self-pay | Admitting: Internal Medicine

## 2016-03-05 ENCOUNTER — Ambulatory Visit (INDEPENDENT_AMBULATORY_CARE_PROVIDER_SITE_OTHER): Payer: Commercial Managed Care - HMO | Admitting: Internal Medicine

## 2016-03-05 ENCOUNTER — Encounter: Payer: Self-pay | Admitting: Internal Medicine

## 2016-03-05 VITALS — BP 152/84 | HR 61 | Temp 98.0°F | Ht 72.5 in | Wt 264.0 lb

## 2016-03-05 DIAGNOSIS — N183 Chronic kidney disease, stage 3 unspecified: Secondary | ICD-10-CM

## 2016-03-05 DIAGNOSIS — M1000 Idiopathic gout, unspecified site: Secondary | ICD-10-CM | POA: Diagnosis not present

## 2016-03-05 DIAGNOSIS — S069X0S Unspecified intracranial injury without loss of consciousness, sequela: Secondary | ICD-10-CM | POA: Diagnosis not present

## 2016-03-05 DIAGNOSIS — R3915 Urgency of urination: Secondary | ICD-10-CM | POA: Diagnosis not present

## 2016-03-05 DIAGNOSIS — E1165 Type 2 diabetes mellitus with hyperglycemia: Secondary | ICD-10-CM | POA: Diagnosis not present

## 2016-03-05 DIAGNOSIS — Z0000 Encounter for general adult medical examination without abnormal findings: Secondary | ICD-10-CM | POA: Diagnosis not present

## 2016-03-05 DIAGNOSIS — E1121 Type 2 diabetes mellitus with diabetic nephropathy: Secondary | ICD-10-CM | POA: Diagnosis not present

## 2016-03-05 DIAGNOSIS — I5032 Chronic diastolic (congestive) heart failure: Secondary | ICD-10-CM | POA: Diagnosis not present

## 2016-03-05 DIAGNOSIS — IMO0002 Reserved for concepts with insufficient information to code with codable children: Secondary | ICD-10-CM

## 2016-03-05 DIAGNOSIS — F325 Major depressive disorder, single episode, in full remission: Secondary | ICD-10-CM

## 2016-03-05 LAB — CBC WITH DIFFERENTIAL/PLATELET
BASOS PCT: 0.6 % (ref 0.0–3.0)
Basophils Absolute: 0 10*3/uL (ref 0.0–0.1)
EOS PCT: 1.1 % (ref 0.0–5.0)
Eosinophils Absolute: 0.1 10*3/uL (ref 0.0–0.7)
HEMATOCRIT: 31.2 % — AB (ref 39.0–52.0)
Hemoglobin: 10 g/dL — ABNORMAL LOW (ref 13.0–17.0)
LYMPHS PCT: 38.5 % (ref 12.0–46.0)
Lymphs Abs: 2.4 10*3/uL (ref 0.7–4.0)
MCHC: 32 g/dL (ref 30.0–36.0)
MCV: 90 fl (ref 78.0–100.0)
MONOS PCT: 9.2 % (ref 3.0–12.0)
Monocytes Absolute: 0.6 10*3/uL (ref 0.1–1.0)
NEUTROS ABS: 3.1 10*3/uL (ref 1.4–7.7)
Neutrophils Relative %: 50.6 % (ref 43.0–77.0)
PLATELETS: 187 10*3/uL (ref 150.0–400.0)
RBC: 3.46 Mil/uL — ABNORMAL LOW (ref 4.22–5.81)
RDW: 16.4 % — AB (ref 11.5–15.5)
WBC: 6.1 10*3/uL (ref 4.0–10.5)

## 2016-03-05 LAB — COMPREHENSIVE METABOLIC PANEL
ALBUMIN: 3.5 g/dL (ref 3.5–5.2)
ALT: 31 U/L (ref 0–53)
AST: 18 U/L (ref 0–37)
Alkaline Phosphatase: 112 U/L (ref 39–117)
BUN: 66 mg/dL — AB (ref 6–23)
CHLORIDE: 110 meq/L (ref 96–112)
CO2: 24 meq/L (ref 19–32)
Calcium: 9.1 mg/dL (ref 8.4–10.5)
Creatinine, Ser: 4.33 mg/dL — ABNORMAL HIGH (ref 0.40–1.50)
GFR: 17.94 mL/min — ABNORMAL LOW (ref >60.00)
GLUCOSE: 83 mg/dL (ref 70–99)
POTASSIUM: 5.2 meq/L — AB (ref 3.5–5.1)
SODIUM: 142 meq/L (ref 135–145)
Total Bilirubin: 0.4 mg/dL (ref 0.2–1.2)
Total Protein: 6.8 g/dL (ref 6.0–8.3)

## 2016-03-05 LAB — LIPID PANEL
Cholesterol: 167 mg/dL (ref 0–200)
HDL: 41.3 mg/dL (ref >39.00)
LDL CALC: 96 mg/dL (ref 0–99)
NONHDL: 125.55
Total CHOL/HDL Ratio: 4
Triglycerides: 149 mg/dL (ref 0.0–149.0)
VLDL: 29.8 mg/dL (ref 0.0–40.0)

## 2016-03-05 LAB — PSA: PSA: 0.7 ng/mL (ref 0.10–4.00)

## 2016-03-05 LAB — HEMOGLOBIN A1C: HEMOGLOBIN A1C: 7.1 % — AB (ref 4.6–6.5)

## 2016-03-05 LAB — HM DIABETES FOOT EXAM

## 2016-03-05 LAB — URIC ACID: Uric Acid, Serum: 3.6 mg/dL — ABNORMAL LOW (ref 4.0–7.8)

## 2016-03-05 LAB — T4, FREE: Free T4: 0.64 ng/dL (ref 0.60–1.60)

## 2016-03-05 MED ORDER — METOPROLOL SUCCINATE ER 25 MG PO TB24: 25.0000 mg | ORAL_TABLET | Freq: Every day | ORAL | Status: DC

## 2016-03-05 MED ORDER — ZOSTER VACCINE LIVE 19400 UNT/0.65ML ~~LOC~~ SUSR: 0.6500 mL | Freq: Once | SUBCUTANEOUS | Status: DC

## 2016-03-05 NOTE — Assessment & Plan Note (Signed)
Has been quiet 

## 2016-03-05 NOTE — Assessment & Plan Note (Signed)
I have personally reviewed the Medicare Annual Wellness questionnaire and have noted 1. The patient's medical and social history 2. Their use of alcohol, tobacco or illicit drugs 3. Their current medications and supplements 4. The patient's functional ability including ADL's, fall risks, home safety risks and hearing or visual             impairment. 5. Diet and physical activities 6. Evidence for depression or mood disorders  The patients weight, height, BMI and visual acuity have been recorded in the chart I have made referrals, counseling and provided education to the patient based review of the above and I have provided the pt with a written personalized care plan for preventive services.  I have provided you with a copy of your personalized plan for preventive services. Please take the time to review along with your updated medication list.  Will check PSA after discussion Colon due 11/17 Rx for zostavax Discussed increasing exercise
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
## 2016-03-05 NOTE — Assessment & Plan Note (Signed)
Disabled from his work Getting along okay with wife's help with finances, etc

## 2016-03-05 NOTE — Assessment & Plan Note (Signed)
On ACEI  Due for labs

## 2016-03-05 NOTE — Assessment & Plan Note (Signed)
Seems to be in remission Will cut citalopram dose

## 2016-03-05 NOTE — Progress Notes (Signed)
Pre visit review using our clinic review tool, if applicable. No additional management support is needed unless otherwise documented below in the visit note. 

## 2016-03-05 NOTE — Addendum Note (Signed)
Addended by: Viviana Simpler I on: 03/05/2016 12:45 PM   Modules accepted: Orders

## 2016-03-05 NOTE — Assessment & Plan Note (Signed)
Compensated Will add beta blocker due to persistent elevated BP

## 2016-03-05 NOTE — Patient Instructions (Signed)
Please set up your eye exam.

## 2016-03-05 NOTE — Assessment & Plan Note (Signed)
Seems to be better No new meds if close to 8%

## 2016-03-06 ENCOUNTER — Other Ambulatory Visit: Payer: Self-pay | Admitting: Internal Medicine

## 2016-03-06 DIAGNOSIS — N179 Acute kidney failure, unspecified: Secondary | ICD-10-CM

## 2016-03-07 ENCOUNTER — Telehealth: Payer: Self-pay | Admitting: Internal Medicine

## 2016-03-07 DIAGNOSIS — R809 Proteinuria, unspecified: Secondary | ICD-10-CM | POA: Diagnosis not present

## 2016-03-07 DIAGNOSIS — N179 Acute kidney failure, unspecified: Secondary | ICD-10-CM | POA: Diagnosis not present

## 2016-03-07 DIAGNOSIS — N183 Chronic kidney disease, stage 3 (moderate): Secondary | ICD-10-CM | POA: Diagnosis not present

## 2016-03-07 DIAGNOSIS — I10 Essential (primary) hypertension: Secondary | ICD-10-CM | POA: Diagnosis not present

## 2016-03-07 NOTE — Telephone Encounter (Signed)
Phone call with Dr Holley Raring Concerning picture with glomerular blood and proteinuria May have progressive diabetic kidney disease He is testing for anything else that could cause this

## 2016-03-12 ENCOUNTER — Encounter: Payer: Self-pay | Admitting: Internal Medicine

## 2016-03-12 DIAGNOSIS — H524 Presbyopia: Secondary | ICD-10-CM | POA: Diagnosis not present

## 2016-03-12 DIAGNOSIS — H5210 Myopia, unspecified eye: Secondary | ICD-10-CM | POA: Diagnosis not present

## 2016-03-12 LAB — HM DIABETES EYE EXAM

## 2016-03-13 ENCOUNTER — Encounter: Payer: Self-pay | Admitting: Internal Medicine

## 2016-03-21 ENCOUNTER — Other Ambulatory Visit: Payer: Self-pay | Admitting: Nephrology

## 2016-03-21 DIAGNOSIS — N179 Acute kidney failure, unspecified: Secondary | ICD-10-CM | POA: Diagnosis not present

## 2016-03-21 DIAGNOSIS — N183 Chronic kidney disease, stage 3 (moderate): Secondary | ICD-10-CM | POA: Diagnosis not present

## 2016-03-22 ENCOUNTER — Inpatient Hospital Stay: Payer: Commercial Managed Care - HMO

## 2016-03-22 ENCOUNTER — Encounter: Payer: Self-pay | Admitting: *Deleted

## 2016-03-22 ENCOUNTER — Inpatient Hospital Stay
Admission: AD | Admit: 2016-03-22 | Discharge: 2016-03-24 | DRG: 683 | Disposition: A | Discharge status: Home / Self Care | Payer: Commercial Managed Care - HMO | Source: Ambulatory Visit | Attending: Internal Medicine | Admitting: Internal Medicine

## 2016-03-22 DIAGNOSIS — E1129 Type 2 diabetes mellitus with other diabetic kidney complication: Secondary | ICD-10-CM | POA: Diagnosis present

## 2016-03-22 DIAGNOSIS — E875 Hyperkalemia: Secondary | ICD-10-CM | POA: Diagnosis not present

## 2016-03-22 DIAGNOSIS — E785 Hyperlipidemia, unspecified: Secondary | ICD-10-CM | POA: Diagnosis present

## 2016-03-22 DIAGNOSIS — R918 Other nonspecific abnormal finding of lung field: Secondary | ICD-10-CM | POA: Diagnosis not present

## 2016-03-22 DIAGNOSIS — Z886 Allergy status to analgesic agent status: Secondary | ICD-10-CM

## 2016-03-22 DIAGNOSIS — Z91041 Radiographic dye allergy status: Secondary | ICD-10-CM

## 2016-03-22 DIAGNOSIS — R3915 Urgency of urination: Secondary | ICD-10-CM | POA: Diagnosis not present

## 2016-03-22 DIAGNOSIS — N186 End stage renal disease: Secondary | ICD-10-CM | POA: Diagnosis not present

## 2016-03-22 DIAGNOSIS — Z91013 Allergy to seafood: Secondary | ICD-10-CM

## 2016-03-22 DIAGNOSIS — E1122 Type 2 diabetes mellitus with diabetic chronic kidney disease: Secondary | ICD-10-CM | POA: Diagnosis not present

## 2016-03-22 DIAGNOSIS — R809 Proteinuria, unspecified: Secondary | ICD-10-CM | POA: Diagnosis not present

## 2016-03-22 DIAGNOSIS — N189 Chronic kidney disease, unspecified: Secondary | ICD-10-CM

## 2016-03-22 DIAGNOSIS — N1330 Unspecified hydronephrosis: Secondary | ICD-10-CM | POA: Diagnosis not present

## 2016-03-22 DIAGNOSIS — E1165 Type 2 diabetes mellitus with hyperglycemia: Secondary | ICD-10-CM | POA: Diagnosis not present

## 2016-03-22 DIAGNOSIS — I10 Essential (primary) hypertension: Secondary | ICD-10-CM | POA: Diagnosis present

## 2016-03-22 DIAGNOSIS — G43909 Migraine, unspecified, not intractable, without status migrainosus: Secondary | ICD-10-CM | POA: Diagnosis not present

## 2016-03-22 DIAGNOSIS — I5032 Chronic diastolic (congestive) heart failure: Secondary | ICD-10-CM | POA: Diagnosis not present

## 2016-03-22 DIAGNOSIS — Z111 Encounter for screening for respiratory tuberculosis: Secondary | ICD-10-CM

## 2016-03-22 DIAGNOSIS — N183 Chronic kidney disease, stage 3 (moderate): Secondary | ICD-10-CM | POA: Diagnosis not present

## 2016-03-22 DIAGNOSIS — Z79899 Other long term (current) drug therapy: Secondary | ICD-10-CM

## 2016-03-22 DIAGNOSIS — N185 Chronic kidney disease, stage 5: Secondary | ICD-10-CM | POA: Diagnosis not present

## 2016-03-22 DIAGNOSIS — I120 Hypertensive chronic kidney disease with stage 5 chronic kidney disease or end stage renal disease: Secondary | ICD-10-CM | POA: Diagnosis present

## 2016-03-22 DIAGNOSIS — E872 Acidosis: Secondary | ICD-10-CM | POA: Diagnosis not present

## 2016-03-22 DIAGNOSIS — N179 Acute kidney failure, unspecified: Secondary | ICD-10-CM | POA: Diagnosis not present

## 2016-03-22 DIAGNOSIS — Z8249 Family history of ischemic heart disease and other diseases of the circulatory system: Secondary | ICD-10-CM | POA: Diagnosis not present

## 2016-03-22 DIAGNOSIS — N529 Male erectile dysfunction, unspecified: Secondary | ICD-10-CM | POA: Diagnosis not present

## 2016-03-22 DIAGNOSIS — F39 Unspecified mood [affective] disorder: Secondary | ICD-10-CM | POA: Diagnosis present

## 2016-03-22 HISTORY — DX: Depression, unspecified: F32.A

## 2016-03-22 HISTORY — DX: Gout, unspecified: M10.9

## 2016-03-22 HISTORY — DX: Major depressive disorder, single episode, unspecified: F32.9

## 2016-03-22 HISTORY — DX: Chronic kidney disease, unspecified: N18.9

## 2016-03-22 LAB — CBC
HEMATOCRIT: 28.7 % — AB (ref 40.0–52.0)
HEMOGLOBIN: 9.2 g/dL — AB (ref 13.0–18.0)
MCH: 28.7 pg (ref 26.0–34.0)
MCHC: 32.2 g/dL (ref 32.0–36.0)
MCV: 89.2 fL (ref 80.0–100.0)
Platelets: 170 10*3/uL (ref 150–440)
RBC: 3.22 MIL/uL — ABNORMAL LOW (ref 4.40–5.90)
RDW: 15.1 % — AB (ref 11.5–14.5)
WBC: 9.4 10*3/uL (ref 3.8–10.6)

## 2016-03-22 LAB — GLUCOSE, CAPILLARY: Glucose-Capillary: 143 mg/dL — ABNORMAL HIGH (ref 65–99)

## 2016-03-22 LAB — CREATININE, SERUM
Creatinine, Ser: 4.89 mg/dL — ABNORMAL HIGH (ref 0.61–1.24)
GFR, EST AFRICAN AMERICAN: 13 mL/min — AB (ref >60)
GFR, EST NON AFRICAN AMERICAN: 12 mL/min — AB (ref >60)

## 2016-03-22 MED ORDER — LORATADINE 10 MG PO TABS
10.0000 mg | ORAL_TABLET | Freq: Every day | ORAL | Status: DC
  Administered 2016-03-23: 10 mg via ORAL
  Filled 2016-03-22: qty 1

## 2016-03-22 MED ORDER — INSULIN ASPART 100 UNIT/ML ~~LOC~~ SOLN: 0.0000 [IU] | Freq: Every day | SUBCUTANEOUS | Status: DC

## 2016-03-22 MED ORDER — SODIUM CHLORIDE 0.9% FLUSH
3.0000 mL | Freq: Two times a day (BID) | INTRAVENOUS | Status: DC
  Administered 2016-03-22 – 2016-03-23 (×3): 3 mL via INTRAVENOUS

## 2016-03-22 MED ORDER — ALLOPURINOL 100 MG PO TABS
200.0000 mg | ORAL_TABLET | Freq: Every day | ORAL | Status: DC
  Administered 2016-03-23: 200 mg via ORAL
  Filled 2016-03-22: qty 2

## 2016-03-22 MED ORDER — CITALOPRAM HYDROBROMIDE 20 MG PO TABS
20.0000 mg | ORAL_TABLET | Freq: Every day | ORAL | Status: DC
  Administered 2016-03-23: 20 mg via ORAL
  Filled 2016-03-22: qty 1

## 2016-03-22 MED ORDER — DIATRIZOATE MEGLUMINE & SODIUM 66-10 % PO SOLN
30.0000 mL | Freq: Once | ORAL | Status: DC
Start: 2016-03-22 — End: 2016-03-24

## 2016-03-22 MED ORDER — AMLODIPINE BESYLATE 10 MG PO TABS
10.0000 mg | ORAL_TABLET | Freq: Every day | ORAL | Status: DC
  Administered 2016-03-23: 10 mg via ORAL
  Filled 2016-03-22: qty 1

## 2016-03-22 MED ORDER — ACETAMINOPHEN 650 MG RE SUPP: 650.0000 mg | Freq: Four times a day (QID) | RECTAL | Status: DC | PRN

## 2016-03-22 MED ORDER — METOPROLOL SUCCINATE ER 25 MG PO TB24
25.0000 mg | ORAL_TABLET | Freq: Every day | ORAL | Status: DC
  Administered 2016-03-23: 25 mg via ORAL
  Filled 2016-03-22: qty 1

## 2016-03-22 MED ORDER — HEPARIN SODIUM (PORCINE) 5000 UNIT/ML IJ SOLN
5000.0000 [IU] | Freq: Three times a day (TID) | INTRAMUSCULAR | Status: DC
  Administered 2016-03-22 – 2016-03-24 (×4): 5000 [IU] via SUBCUTANEOUS
  Filled 2016-03-22 (×4): qty 1

## 2016-03-22 MED ORDER — ONDANSETRON HCL 4 MG PO TABS: 4.0000 mg | ORAL_TABLET | Freq: Four times a day (QID) | ORAL | Status: DC | PRN

## 2016-03-22 MED ORDER — ONDANSETRON HCL 4 MG/2ML IJ SOLN: 4.0000 mg | Freq: Four times a day (QID) | INTRAMUSCULAR | Status: DC | PRN

## 2016-03-22 MED ORDER — ACETAMINOPHEN 325 MG PO TABS: 650.0000 mg | ORAL_TABLET | Freq: Four times a day (QID) | ORAL | Status: DC | PRN

## 2016-03-22 MED ORDER — LABETALOL HCL 5 MG/ML IV SOLN
10.0000 mg | INTRAVENOUS | Status: DC | PRN
  Administered 2016-03-22 – 2016-03-23 (×2): 10 mg via INTRAVENOUS
  Filled 2016-03-22 (×4): qty 4

## 2016-03-22 MED ORDER — DOCUSATE SODIUM 100 MG PO CAPS
100.0000 mg | ORAL_CAPSULE | Freq: Two times a day (BID) | ORAL | Status: DC
  Administered 2016-03-23: 100 mg via ORAL
  Filled 2016-03-22: qty 1

## 2016-03-22 MED ORDER — INSULIN ASPART 100 UNIT/ML ~~LOC~~ SOLN
0.0000 [IU] | Freq: Three times a day (TID) | SUBCUTANEOUS | Status: DC
  Administered 2016-03-23 – 2016-03-24 (×3): 1 [IU] via SUBCUTANEOUS
  Filled 2016-03-22 (×3): qty 1

## 2016-03-22 MED ORDER — ATORVASTATIN CALCIUM 20 MG PO TABS
20.0000 mg | ORAL_TABLET | Freq: Every day | ORAL | Status: DC
  Administered 2016-03-23: 20 mg via ORAL
  Filled 2016-03-22: qty 1
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
## 2016-03-23 LAB — BASIC METABOLIC PANEL
Anion gap: 9 (ref 5–15)
BUN: 67 mg/dL — AB (ref 6–20)
CHLORIDE: 112 mmol/L — AB (ref 101–111)
CO2: 19 mmol/L — AB (ref 22–32)
Calcium: 8.1 mg/dL — ABNORMAL LOW (ref 8.9–10.3)
Creatinine, Ser: 4.78 mg/dL — ABNORMAL HIGH (ref 0.61–1.24)
GFR calc Af Amer: 14 mL/min — ABNORMAL LOW (ref >60)
GFR calc non Af Amer: 12 mL/min — ABNORMAL LOW (ref >60)
GLUCOSE: 125 mg/dL — AB (ref 65–99)
POTASSIUM: 3.4 mmol/L — AB (ref 3.5–5.1)
Sodium: 140 mmol/L (ref 135–145)

## 2016-03-23 LAB — URINALYSIS COMPLETE WITH MICROSCOPIC (ARMC ONLY)
BILIRUBIN URINE: NEGATIVE
Glucose, UA: 50 mg/dL — AB
KETONES UR: NEGATIVE mg/dL
Leukocytes, UA: NEGATIVE
Nitrite: NEGATIVE
PH: 5 (ref 5.0–8.0)
Protein, ur: >500 mg/dL — AB
SPECIFIC GRAVITY, URINE: 1.008 (ref 1.005–1.030)
SQUAMOUS EPITHELIAL / LPF: NONE SEEN

## 2016-03-23 LAB — CBC
HEMATOCRIT: 25.3 % — AB (ref 40.0–52.0)
Hemoglobin: 8.3 g/dL — ABNORMAL LOW (ref 13.0–18.0)
MCH: 29.6 pg (ref 26.0–34.0)
MCHC: 32.9 g/dL (ref 32.0–36.0)
MCV: 90 fL (ref 80.0–100.0)
Platelets: 157 10*3/uL (ref 150–440)
RBC: 2.81 MIL/uL — ABNORMAL LOW (ref 4.40–5.90)
RDW: 15.6 % — AB (ref 11.5–14.5)
WBC: 6.7 10*3/uL (ref 3.8–10.6)

## 2016-03-23 LAB — HEMOGLOBIN A1C: Hgb A1c MFr Bld: 6.7 % — ABNORMAL HIGH (ref 4.0–6.0)

## 2016-03-23 LAB — GLUCOSE, CAPILLARY
GLUCOSE-CAPILLARY: 94 mg/dL (ref 65–99)
Glucose-Capillary: 124 mg/dL — ABNORMAL HIGH (ref 65–99)
Glucose-Capillary: 132 mg/dL — ABNORMAL HIGH (ref 65–99)
Glucose-Capillary: 101 mg/dL — ABNORMAL HIGH (ref 65–99)

## 2016-03-23 MED ORDER — LISINOPRIL 20 MG PO TABS
40.0000 mg | ORAL_TABLET | Freq: Every day | ORAL | Status: DC
  Administered 2016-03-23: 40 mg via ORAL
  Filled 2016-03-23: qty 2

## 2016-03-23 MED ORDER — HYDRALAZINE HCL 20 MG/ML IJ SOLN
10.0000 mg | Freq: Four times a day (QID) | INTRAMUSCULAR | Status: DC | PRN
  Filled 2016-03-23: qty 1

## 2016-03-23 MED ORDER — FLUTICASONE PROPIONATE 50 MCG/ACT NA SUSP
1.0000 | Freq: Every day | NASAL | Status: DC
  Filled 2016-03-23: qty 16

## 2016-03-23 MED ORDER — ADULT MULTIVITAMIN W/MINERALS CH
ORAL_TABLET | Freq: Every day | ORAL | Status: DC
  Administered 2016-03-23: 1 via ORAL
  Filled 2016-03-23: qty 1

## 2016-03-23 MED ORDER — GLIPIZIDE 5 MG PO TABS: 10.0000 mg | ORAL_TABLET | Freq: Every day | ORAL | Status: DC

## 2016-03-23 NOTE — Progress Notes (Signed)
Per MD, Okay to give scheduled Toprol

## 2016-03-23 NOTE — Care Management (Signed)
Admitted for acute on chronic renal failure.  Was direct admit from  PCP due to lab values. Nephrology  and urology consults pending. Independent in all adls, denies issues accessing medical care, obtaining medications or with transportation.  Current with  PCP.  No discharge needs identified at present by care manager or members of care team
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
## 2016-03-23 NOTE — Care Management Important Message (Signed)
Important Message  Patient Details  Name: Richard Chen MRN: [id number] Date of Birth: 05-Apr-1956   Medicare Important Message Given:  Yes    Katrina Stack, RN 03/23/2016, 9:58 AM

## 2016-03-23 NOTE — Progress Notes (Addendum)
Per MD, Okay to put in order for vascular consult. Pt's IV has infiltrated, unable to give PRN BP medication. Passed along to pm RN and. Supervisor called to assist with IV insertion.

## 2016-03-23 NOTE — Consult Note (Signed)
Urology consult:  I was consulted by the ward clerk on patient's acute renal failure possible hydronephrosis. I reviewed the patient's chart-notes, labs, imaging. Per the notes there may been the suggestion of left hydronephrosis on outside renal ultrasound. Patient presenting with a baseline creatinine of 1.5-1.6 but now in acute renal failure. A noncontrast CT scan of the abdomen and pelvis was obtained and I agree with the radiologist, there is no significant hydronephrosis. There is an extrarenal pelvis on the left but no dilation of the ureter or collecting system (mild dilation of lower pole calyx). The kidney appears to have good parenchyma indicating no long-term obstruction or atrophy. He does not need urologic intervention. I agree with medical management. I will sign off but please call on-call urologist for any questions, concerns or change in patient's status.

## 2016-03-23 NOTE — Progress Notes (Signed)
Patient ID: CRISTO APPLIN, male   DOB: 06-28-1956, 60 y.o.   MRN: [id number] [facility] NAME: Saintclair Mcevoy    MR#:  [id number]  DATE OF BIRTH:  Oct 28, 1955  SUBJECTIVE:   Came in after patient was informed his outpatient labs showed elevated creatinine. Patient asymptomatic. Daughter in the room REVIEW OF SYSTEMS:   Review of Systems  Constitutional: Negative for fever, chills and weight loss.  HENT: Negative for ear discharge, ear pain and nosebleeds.   Eyes: Negative for blurred vision, pain and discharge.  Respiratory: Negative for sputum production, shortness of breath, wheezing and stridor.   Cardiovascular: Negative for chest pain, palpitations, orthopnea and PND.  Gastrointestinal: Negative for nausea, vomiting, abdominal pain and diarrhea.  Genitourinary: Negative for urgency and frequency.  Musculoskeletal: Negative for back pain and joint pain.  Neurological: Negative for sensory change, speech change, focal weakness and weakness.  Psychiatric/Behavioral: Negative for depression and hallucinations. The patient is not nervous/anxious.   All other systems reviewed and are negative.  Tolerating Diet:yes Tolerating PT: not needed  DRUG ALLERGIES:   Allergies  Allergen Reactions  . Aspirin   . Other     Dye when viewed kidney  . Shrimp [Shellfish Allergy]     VITALS:  Blood pressure 182/60, pulse 51, temperature 99.4 F (37.4 C), temperature source Oral, resp. rate 19, height 6' 1.5" (1.867 m), weight 124.376 kg (274 lb 3.2 oz), SpO2 98 %.  PHYSICAL EXAMINATION:   Physical Exam  GENERAL:  60 y.o.-year-old patient lying in the bed with no acute distress.  EYES: Pupils equal, round, reactive to light and accommodation. No scleral icterus. Extraocular muscles intact.  HEENT: Head atraumatic, normocephalic. Oropharynx and nasopharynx clear.  NECK:  Supple, no jugular venous distention. No thyroid  enlargement, no tenderness.  LUNGS: Normal breath sounds bilaterally, no wheezing, rales, rhonchi. No use of accessory muscles of respiration.  CARDIOVASCULAR: S1, S2 normal. No murmurs, rubs, or gallops.  ABDOMEN: Soft, nontender, nondistended. Bowel sounds present. No organomegaly or mass.  EXTREMITIES: No cyanosis, clubbing or edema b/l.    NEUROLOGIC: Cranial nerves II through XII are intact. No focal Motor or sensory deficits b/l.   PSYCHIATRIC:  patient is alert and oriented x 3.  SKIN: No obvious rash, lesion, or ulcer.   LABORATORY PANEL:  CBC  Recent Labs Lab 03/23/16 0406  WBC 6.7  HGB 8.3*  HCT 25.3*  PLT 157    Chemistries   Recent Labs Lab 03/23/16 0406  NA 140  K 3.4*  CL 112*  CO2 19*  GLUCOSE 125*  BUN 67*  CREATININE 4.78*  CALCIUM 8.1*   Cardiac Enzymes No results for input(s): TROPONINI in the last 168 hours. RADIOLOGY:  Ct Abdomen Pelvis Wo Contrast  03/23/2016  CLINICAL DATA:  Acute onset of hyperkalemia. Left-sided hydronephrosis. Initial encounter. EXAM: CT ABDOMEN AND PELVIS WITHOUT CONTRAST TECHNIQUE: Multidetector CT imaging of the abdomen and pelvis was performed following the standard protocol without IV contrast. COMPARISON:  None. FINDINGS: The visualized lung bases are clear. The liver and spleen are unremarkable in appearance. Postoperative change is noted near the pancreatic tail. The gallbladder is within normal limits. The pancreas and adrenal glands are unremarkable. Scattered right renal cysts are noted. Nonspecific perinephric stranding is noted bilaterally. A left-sided extrarenal pelvis is seen. There is no evidence of hydronephrosis. No renal or ureteral stones are identified. No free fluid is identified. The small bowel is unremarkable  in appearance. The stomach is within normal limits. No acute vascular abnormalities are seen. Scattered calcification is seen along the abdominal aorta and its branches. The appendix is normal in caliber,  without evidence of appendicitis. The colon is grossly unremarkable in appearance. The bladder is mildly distended and grossly unremarkable. The prostate is normal in size. No inguinal lymphadenopathy is seen. No acute osseous abnormalities are identified. There is chronic deformity involving the left superior and inferior pubic rami. Chronic left-sided rib deformities are seen. Endplate sclerotic change and vacuum phenomenon are noted at L5-S1. IMPRESSION: 1. No acute abnormality seen to explain the patient's symptoms. 2. Left-sided extrarenal pelvis noted, without evidence of significant hydronephrosis. Scattered right renal cysts seen. 3. Scattered calcification along the abdominal aorta and its branches. Electronically Signed   By: Garald Balding M.D.   On: 03/23/2016 01:53   ASSESSMENT AND PLAN:   Shasta Capstick is a 60 y.o. male who presents After follow-up visit with his PCP where routine labs showed acute on chronic renal failure. Patient recently saw his primary physician for Medicare wellness check, and routine labs showed that his creatinine had risen from his baseline of 1 to nearly 4.5. He also had some hyperkalemia at that time. He was then seen by Dr. Holley Raring, who ordered an ultrasound which showed some left hydronephrosis.  1. Acute on chronic renal failure -Patient's baseline creatinine was 1.0----4.3--- 4.6 -Receiving IV fluids -No evidence of hydronephrosis on CT scan of urology -Awaiting nephrology consultation -Avoid nephrotoxins -Patient has risk factor for hypertension and diabetes  2. Hypertension -Continue home meds -As needed IV hydralazine  3. Type 2 diabetes Sliding scale insulin and home meds  4. Hyperlipidemia continue atorvastatin  Case discussed with Care Management/Social Worker. Management plans discussed with the patient, family and they are in agreement.  CODE STATUS: full  DVT Prophylaxis: heparin  TOTAL TIME TAKING CARE OF THIS PATIENT: 30 minutes.   >50% time spent on counselling and coordination of care  POSSIBLE D/C IN *1-2DAYS, DEPENDING ON CLINICAL CONDITION.  Note: This dictation was prepared with Dragon dictation along with smaller phrase technology. Any transcriptional errors that result from this process are unintentional.  Tashan Kreitzer M.D on 03/23/2016 at 1:49 PM  Between 7am to 6pm - Pager - [phone]  After 6pm go to [email or web address] - password [email or web address] [facility]  Office  [phone]  CC: Primary care physician; Viviana Simpler, MD

## 2016-03-23 NOTE — Progress Notes (Signed)
Per Caryl Pina, Lab tech, okay to use chart label to send down urine sample.

## 2016-03-24 ENCOUNTER — Inpatient Hospital Stay: Payer: Commercial Managed Care - HMO

## 2016-03-24 LAB — BASIC METABOLIC PANEL
ANION GAP: 8 (ref 5–15)
BUN: 70 mg/dL — ABNORMAL HIGH (ref 6–20)
CALCIUM: 8.4 mg/dL — AB (ref 8.9–10.3)
CO2: 19 mmol/L — AB (ref 22–32)
Chloride: 113 mmol/L — ABNORMAL HIGH (ref 101–111)
Creatinine, Ser: 4.83 mg/dL — ABNORMAL HIGH (ref 0.61–1.24)
GFR, EST AFRICAN AMERICAN: 14 mL/min — AB (ref >60)
GFR, EST NON AFRICAN AMERICAN: 12 mL/min — AB (ref >60)
Glucose, Bld: 105 mg/dL — ABNORMAL HIGH (ref 65–99)
Potassium: 3.5 mmol/L (ref 3.5–5.1)
Sodium: 140 mmol/L (ref 135–145)

## 2016-03-24 LAB — DIFFERENTIAL
BASOS ABS: 0.2 10*3/uL — AB (ref 0–0.1)
BASOS PCT: 4 %
EOS ABS: 0.1 10*3/uL (ref 0–0.7)
Eosinophils Relative: 3 %
Lymphocytes Relative: 17 %
Lymphs Abs: 0.9 10*3/uL — ABNORMAL LOW (ref 1.0–3.6)
MONO ABS: 0.4 10*3/uL (ref 0.2–1.0)
MONOS PCT: 8 %
NEUTROS ABS: 3.6 10*3/uL (ref 1.4–6.5)
Neutrophils Relative %: 68 %

## 2016-03-24 LAB — COMPREHENSIVE METABOLIC PANEL
ALK PHOS: 101 U/L (ref 38–126)
ALT: 136 U/L — AB (ref 17–63)
AST: 57 U/L — AB (ref 15–41)
Albumin: 2.9 g/dL — ABNORMAL LOW (ref 3.5–5.0)
Anion gap: 7 (ref 5–15)
BUN: 69 mg/dL — AB (ref 6–20)
CALCIUM: 8.6 mg/dL — AB (ref 8.9–10.3)
CO2: 20 mmol/L — ABNORMAL LOW (ref 22–32)
CREATININE: 4.94 mg/dL — AB (ref 0.61–1.24)
Chloride: 114 mmol/L — ABNORMAL HIGH (ref 101–111)
GFR, EST AFRICAN AMERICAN: 13 mL/min — AB (ref >60)
GFR, EST NON AFRICAN AMERICAN: 11 mL/min — AB (ref >60)
Glucose, Bld: 122 mg/dL — ABNORMAL HIGH (ref 65–99)
Potassium: 3.7 mmol/L (ref 3.5–5.1)
Sodium: 141 mmol/L (ref 135–145)
Total Bilirubin: 0.5 mg/dL (ref 0.3–1.2)
Total Protein: 6.6 g/dL (ref 6.5–8.1)

## 2016-03-24 LAB — CBC
HCT: 28.5 % — ABNORMAL LOW (ref 40.0–52.0)
Hemoglobin: 9.2 g/dL — ABNORMAL LOW (ref 13.0–18.0)
MCH: 28.9 pg (ref 26.0–34.0)
MCHC: 32.2 g/dL (ref 32.0–36.0)
MCV: 89.8 fL (ref 80.0–100.0)
PLATELETS: 166 10*3/uL (ref 150–440)
RBC: 3.18 MIL/uL — ABNORMAL LOW (ref 4.40–5.90)
RDW: 15.7 % — AB (ref 11.5–14.5)
WBC: 5.2 10*3/uL (ref 3.8–10.6)

## 2016-03-24 LAB — GLUCOSE, CAPILLARY
Glucose-Capillary: 122 mg/dL — ABNORMAL HIGH (ref 65–99)
Glucose-Capillary: 102 mg/dL — ABNORMAL HIGH (ref 65–99)

## 2016-03-24 LAB — PHOSPHORUS: PHOSPHORUS: 5 mg/dL — AB (ref 2.5–4.6)

## 2016-03-24 LAB — FERRITIN: Ferritin: 304 ng/mL (ref 24–336)

## 2016-03-24 LAB — IRON AND TIBC
IRON: 56 ug/dL (ref 45–182)
Saturation Ratios: 23 % (ref 17.9–39.5)
TIBC: 243 ug/dL — ABNORMAL LOW (ref 250–450)
UIBC: 187 ug/dL

## 2016-03-24 MED ORDER — TUBERCULIN PPD 5 UNIT/0.1ML ID SOLN
5.0000 [IU] | Freq: Once | INTRADERMAL | Status: DC
  Filled 2016-03-24: qty 0.1
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
## 2016-03-24 NOTE — Progress Notes (Signed)
Patient ID: Richard Chen, male   DOB: 1956/01/04, 60 y.o.   MRN: [id number] [place] [facility] [place] NAME: Richard Chen    MR#:  [id number]  DATE OF BIRTH:  1956/09/27  SUBJECTIVE:   Came in after patient was informed his outpatient labs showed elevated creatinine. Patient asymptomatic. wife in the room. Pt and family has had lot of questions. Answered them at best Pt agreeable for dialysis REVIEW OF SYSTEMS:   Review of Systems  Constitutional: Negative for fever, chills and weight loss.  HENT: Negative for ear discharge, ear pain and nosebleeds.   Eyes: Negative for blurred vision, pain and discharge.  Respiratory: Negative for sputum production, shortness of breath, wheezing and stridor.   Cardiovascular: Negative for chest pain, palpitations, orthopnea and PND.  Gastrointestinal: Negative for nausea, vomiting, abdominal pain and diarrhea.  Genitourinary: Negative for urgency and frequency.  Musculoskeletal: Negative for back pain and joint pain.  Neurological: Negative for sensory change, speech change, focal weakness and weakness.  Psychiatric/Behavioral: Negative for depression and hallucinations. The patient is not nervous/anxious.   All other systems reviewed and are negative.  Tolerating Diet:yes Tolerating PT: not needed  DRUG ALLERGIES:   Allergies  Allergen Reactions  . Aspirin   . Other     Dye when viewed kidney  . Shrimp [Shellfish Allergy]     VITALS:  Blood pressure 172/78, pulse 57, temperature 98.3 F (36.8 C), temperature source Oral, resp. rate 20, height 6' 1.5" (1.867 m), weight 124.376 kg (274 lb 3.2 oz), SpO2 99 %.  PHYSICAL EXAMINATION:   Physical Exam  GENERAL:  60 y.o.-year-old patient lying in the bed with no acute distress.  EYES: Pupils equal, round, reactive to light and accommodation. No scleral icterus. Extraocular muscles intact.  HEENT: Head atraumatic, normocephalic. Oropharynx and  nasopharynx clear.  NECK:  Supple, no jugular venous distention. No thyroid enlargement, no tenderness.  LUNGS: Normal breath sounds bilaterally, no wheezing, rales, rhonchi. No use of accessory muscles of respiration.  CARDIOVASCULAR: S1, S2 normal. No murmurs, rubs, or gallops.  ABDOMEN: Soft, nontender, nondistended. Bowel sounds present. No organomegaly or mass.  EXTREMITIES: No cyanosis, clubbing or edema b/l.    NEUROLOGIC: Cranial nerves II through XII are intact. No focal Motor or sensory deficits b/l.   PSYCHIATRIC:  patient is alert and oriented x 3.  SKIN: No obvious rash, lesion, or ulcer.   LABORATORY PANEL:  CBC  Recent Labs Lab 03/23/16 0406  WBC 6.7  HGB 8.3*  HCT 25.3*  PLT 157    Chemistries   Recent Labs Lab 03/24/16 0546  NA 140  K 3.5  CL 113*  CO2 19*  GLUCOSE 105*  BUN 70*  CREATININE 4.83*  CALCIUM 8.4*   Cardiac Enzymes No results for input(s): TROPONINI in the last 168 hours. RADIOLOGY:  Ct Abdomen Pelvis Wo Contrast  03/23/2016  CLINICAL DATA:  Acute onset of hyperkalemia. Left-sided hydronephrosis. Initial encounter. EXAM: CT ABDOMEN AND PELVIS WITHOUT CONTRAST TECHNIQUE: Multidetector CT imaging of the abdomen and pelvis was performed following the standard protocol without IV contrast. COMPARISON:  None. FINDINGS: The visualized lung bases are clear. The liver and spleen are unremarkable in appearance. Postoperative change is noted near the pancreatic tail. The gallbladder is within normal limits. The pancreas and adrenal glands are unremarkable. Scattered right renal cysts are noted. Nonspecific perinephric stranding is noted bilaterally. A left-sided extrarenal pelvis is seen. There is no evidence of hydronephrosis. No  renal or ureteral stones are identified. No free fluid is identified. The small bowel is unremarkable in appearance. The stomach is within normal limits. No acute vascular abnormalities are seen. Scattered calcification is seen  along the abdominal aorta and its branches. The appendix is normal in caliber, without evidence of appendicitis. The colon is grossly unremarkable in appearance. The bladder is mildly distended and grossly unremarkable. The prostate is normal in size. No inguinal lymphadenopathy is seen. No acute osseous abnormalities are identified. There is chronic deformity involving the left superior and inferior pubic rami. Chronic left-sided rib deformities are seen. Endplate sclerotic change and vacuum phenomenon are noted at L5-S1. IMPRESSION: 1. No acute abnormality seen to explain the patient's symptoms. 2. Left-sided extrarenal pelvis noted, without evidence of significant hydronephrosis. Scattered right renal cysts seen. 3. Scattered calcification along the abdominal aorta and its branches. Electronically Signed   By: Garald Balding M.D.   On: 03/23/2016 01:53   ASSESSMENT AND PLAN:   Richard Chen is a 60 y.o. male who presents After follow-up visit with his PCP where routine labs showed acute on chronic renal failure. Patient recently saw his primary physician for Medicare wellness check, and routine labs showed that his creatinine had risen from his baseline of 1 to nearly 4.5. He also had some hyperkalemia at that time. He was then seen by Dr. Holley Raring, who ordered an ultrasound which showed some left hydronephrosis.  1. Acute on chronic renal failure now progressed to ESRD and will be started on HD -Patient's baseline creatinine was 1.0----4.3--- 4.6 -No evidence of hydronephrosis on CT scan of urology - nephrology consultation appreciated -Avoid nephrotoxins -Patient has risk factor for hypertension and diabetes -vascular to place HD cath  2. Hypertension -Continue home meds -As needed IV hydralazine  3. Type 2 diabetes Sliding scale insulin and home meds  4. Hyperlipidemia continue atorvastatin  Case discussed with Care Management/Social Worker. Management plans discussed with the patient,  family and they are in agreement.  CODE STATUS: full  DVT Prophylaxis: heparin  TOTAL TIME TAKING CARE OF THIS PATIENT: 30 minutes.  >50% time spent on counselling and coordination of care  POSSIBLE D/C IN *1-2DAYS, DEPENDING ON CLINICAL CONDITION.  Note: This dictation was prepared with Dragon dictation along with smaller phrase technology. Any transcriptional errors that result from this process are unintentional.  Sunnie Odden M.D on 03/24/2016 at 8:16 AM  Between 7am to 6pm - Pager - [phone]  After 6pm go to [email or web address] - password [email or web address] [facility]  Office  [phone]  CC: Primary care physician; Viviana Simpler, MD

## 2016-03-24 NOTE — Progress Notes (Signed)
Patient ID: Richard Chen, male   DOB: 07-29-1956, 60 y.o.   MRN: [id number] Pt and family do not want 2 procedures done. Per nephrology ok to go home and get perm cath placed on Monday by dr dew D/c home
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
## 2016-03-24 NOTE — Progress Notes (Signed)
03/24/2016 3:05 PM  BP 162/71 mmHg  Pulse 54  Temp(Src) 98 F (36.7 C) (Oral)  Resp 19  Ht 6' 1.5" (1.867 m)  Wt 124.376 kg (274 lb 3.2 oz)  BMI 35.68 kg/m2  SpO2 98% Patient discharged per MD orders. Discharge instructions reviewed with patient and patient verbalized understanding. IV removed per policy. Patient instructed to arrive at 7 AM March 26, 2016 to registration desk for permcath placement. Patient instructed to be NPO after midnight March 25, 2016. Discharged via wheelchair escorted by nursing staff.  Almedia Balls, RN

## 2016-03-25 LAB — PARATHYROID HORMONE, INTACT (NO CA): PTH: 91 pg/mL — AB (ref 15–65)

## 2016-03-25 LAB — HEPATITIS B SURFACE ANTIGEN: HEP B S AG: NEGATIVE

## 2016-03-25 LAB — HEPATITIS B SURFACE ANTIBODY,QUALITATIVE: HEP B S AB: NONREACTIVE

## 2016-03-25 LAB — HEPATITIS B CORE ANTIBODY, TOTAL: HEP B C TOTAL AB: NEGATIVE

## 2016-03-26 ENCOUNTER — Ambulatory Visit
Admission: AD | Admit: 2016-03-26 | Discharge: 2016-03-26 | Disposition: A | Discharge status: Home / Self Care | Payer: Commercial Managed Care - HMO | Source: Ambulatory Visit | Attending: Vascular Surgery | Admitting: Vascular Surgery

## 2016-03-26 ENCOUNTER — Encounter: Payer: Self-pay | Admitting: *Deleted

## 2016-03-26 ENCOUNTER — Encounter
Admission: AD | Disposition: A | Discharge status: Home / Self Care | Payer: Self-pay | Source: Ambulatory Visit | Attending: Vascular Surgery

## 2016-03-26 DIAGNOSIS — I132 Hypertensive heart and chronic kidney disease with heart failure and with stage 5 chronic kidney disease, or end stage renal disease: Secondary | ICD-10-CM | POA: Diagnosis not present

## 2016-03-26 DIAGNOSIS — Z886 Allergy status to analgesic agent status: Secondary | ICD-10-CM | POA: Insufficient documentation

## 2016-03-26 DIAGNOSIS — Z91013 Allergy to seafood: Secondary | ICD-10-CM | POA: Insufficient documentation

## 2016-03-26 DIAGNOSIS — Z8249 Family history of ischemic heart disease and other diseases of the circulatory system: Secondary | ICD-10-CM | POA: Diagnosis not present

## 2016-03-26 DIAGNOSIS — E872 Acidosis: Secondary | ICD-10-CM | POA: Diagnosis not present

## 2016-03-26 DIAGNOSIS — Z91041 Radiographic dye allergy status: Secondary | ICD-10-CM | POA: Diagnosis not present

## 2016-03-26 DIAGNOSIS — Z992 Dependence on renal dialysis: Secondary | ICD-10-CM | POA: Insufficient documentation

## 2016-03-26 DIAGNOSIS — Z7984 Long term (current) use of oral hypoglycemic drugs: Secondary | ICD-10-CM | POA: Diagnosis not present

## 2016-03-26 DIAGNOSIS — I509 Heart failure, unspecified: Secondary | ICD-10-CM | POA: Diagnosis not present

## 2016-03-26 DIAGNOSIS — N179 Acute kidney failure, unspecified: Secondary | ICD-10-CM

## 2016-03-26 DIAGNOSIS — N186 End stage renal disease: Secondary | ICD-10-CM | POA: Insufficient documentation

## 2016-03-26 DIAGNOSIS — E1122 Type 2 diabetes mellitus with diabetic chronic kidney disease: Secondary | ICD-10-CM | POA: Diagnosis not present

## 2016-03-26 DIAGNOSIS — M109 Gout, unspecified: Secondary | ICD-10-CM | POA: Diagnosis not present

## 2016-03-26 DIAGNOSIS — Z79899 Other long term (current) drug therapy: Secondary | ICD-10-CM | POA: Diagnosis not present

## 2016-03-26 DIAGNOSIS — E785 Hyperlipidemia, unspecified: Secondary | ICD-10-CM | POA: Diagnosis not present

## 2016-03-26 DIAGNOSIS — G43909 Migraine, unspecified, not intractable, without status migrainosus: Secondary | ICD-10-CM | POA: Insufficient documentation

## 2016-03-26 DIAGNOSIS — M7989 Other specified soft tissue disorders: Secondary | ICD-10-CM | POA: Insufficient documentation

## 2016-03-26 DIAGNOSIS — Z809 Family history of malignant neoplasm, unspecified: Secondary | ICD-10-CM | POA: Diagnosis not present

## 2016-03-26 DIAGNOSIS — F329 Major depressive disorder, single episode, unspecified: Secondary | ICD-10-CM | POA: Diagnosis not present

## 2016-03-26 DIAGNOSIS — N189 Chronic kidney disease, unspecified: Secondary | ICD-10-CM

## 2016-03-26 DIAGNOSIS — E875 Hyperkalemia: Secondary | ICD-10-CM | POA: Diagnosis not present

## 2016-03-26 HISTORY — PX: PERIPHERAL VASCULAR CATHETERIZATION: SHX172C

## 2016-03-26 LAB — GLUCOSE, CAPILLARY: GLUCOSE-CAPILLARY: 153 mg/dL — AB (ref 65–99)

## 2016-03-26 SURGERY — DIALYSIS/PERMA CATHETER INSERTION
Anesthesia: Moderate Sedation

## 2016-03-26 MED ORDER — FAMOTIDINE 20 MG PO TABS
20.0000 mg | ORAL_TABLET | Freq: Once | ORAL | Status: AC
  Administered 2016-03-26: 20 mg via ORAL

## 2016-03-26 MED ORDER — FENTANYL CITRATE (PF) 100 MCG/2ML IJ SOLN
INTRAMUSCULAR | Status: AC
  Filled 2016-03-26: qty 2

## 2016-03-26 MED ORDER — HEPARIN SODIUM (PORCINE) 10000 UNIT/ML IJ SOLN
INTRAMUSCULAR | Status: AC
  Filled 2016-03-26: qty 1

## 2016-03-26 MED ORDER — MIDAZOLAM HCL 2 MG/2ML IJ SOLN
INTRAMUSCULAR | Status: DC | PRN
  Administered 2016-03-26 (×2): 2 mg via INTRAVENOUS

## 2016-03-26 MED ORDER — MIDAZOLAM HCL 5 MG/5ML IJ SOLN
INTRAMUSCULAR | Status: AC
  Filled 2016-03-26: qty 5

## 2016-03-26 MED ORDER — LIDOCAINE-EPINEPHRINE (PF) 1 %-1:200000 IJ SOLN
INTRAMUSCULAR | Status: DC | PRN
  Administered 2016-03-26: 20 mL via INTRADERMAL

## 2016-03-26 MED ORDER — METHYLPREDNISOLONE SODIUM SUCC 125 MG IJ SOLR
INTRAMUSCULAR | Status: AC
  Administered 2016-03-26: 125 mg via INTRAVENOUS
  Filled 2016-03-26: qty 2

## 2016-03-26 MED ORDER — METHYLPREDNISOLONE SODIUM SUCC 125 MG IJ SOLR
125.0000 mg | INTRAMUSCULAR | Status: AC
  Administered 2016-03-26: 125 mg via INTRAVENOUS

## 2016-03-26 MED ORDER — HEPARIN (PORCINE) IN NACL 2-0.9 UNIT/ML-% IJ SOLN
INTRAMUSCULAR | Status: AC
  Filled 2016-03-26: qty 500

## 2016-03-26 MED ORDER — DEXTROSE 5 % IV SOLN
1.5000 g | Freq: Once | INTRAVENOUS | Status: AC
  Administered 2016-03-26: 1.5 g via INTRAVENOUS

## 2016-03-26 MED ORDER — SODIUM CHLORIDE 0.9 % IV SOLN
INTRAVENOUS | Status: DC
Start: 2016-03-26 — End: 2016-03-26
  Administered 2016-03-26: 08:00:00 via INTRAVENOUS

## 2016-03-26 MED ORDER — LIDOCAINE-EPINEPHRINE (PF) 1 %-1:200000 IJ SOLN
INTRAMUSCULAR | Status: AC
  Filled 2016-03-26: qty 30

## 2016-03-26 MED ORDER — FENTANYL CITRATE (PF) 100 MCG/2ML IJ SOLN
INTRAMUSCULAR | Status: DC | PRN
  Administered 2016-03-26 (×2): 50 ug via INTRAVENOUS

## 2016-03-26 MED ORDER — DEXTROSE 5 % IV SOLN
INTRAVENOUS | Status: AC
  Filled 2016-03-26 (×30): qty 1.5

## 2016-03-26 MED ORDER — FAMOTIDINE 20 MG PO TABS
ORAL_TABLET | ORAL | Status: AC
  Administered 2016-03-26: 20 mg via ORAL
  Filled 2016-03-26: qty 1

## 2016-03-26 SURGICAL SUPPLY — 4 items
CANNULA 5F STIFF (CANNULA) ×2 IMPLANT
CATH PALINDROME RT-P 15FX19CM (CATHETERS) ×2 IMPLANT
PACK ANGIOGRAPHY (CUSTOM PROCEDURE TRAY) ×2 IMPLANT
TOWEL OR 17X26 4PK STRL BLUE (TOWEL DISPOSABLE) ×2 IMPLANT

## 2016-03-26 NOTE — H&P (Signed)
   [facility] History & Physical Update  The patient was interviewed and re-examined.  The patient's previous History and Physical has been reviewed and is unchanged.  There is no change in the plan of care. We plan to proceed with the scheduled procedure.  Dalasia Predmore, MD  03/26/2016, 8:04 AM

## 2016-03-26 NOTE — Op Note (Signed)
OPERATIVE NOTE    PRE-OPERATIVE DIAGNOSIS: 1. ESRD   POST-OPERATIVE DIAGNOSIS: same as above  PROCEDURE: 1. Ultrasound guidance for vascular access to the right internal jugular vein 2. Fluoroscopic guidance for placement of catheter 3. Placement of a 19 cm tip to cuff tunneled hemodialysis catheter via the right internal jugular vein  SURGEON: Leotis Pain, MD  ANESTHESIA:  Local with Moderate conscious sedation for approximately 20 minutes using 4 mg of Versed and 100 mcg of Fentanyl  ESTIMATED BLOOD LOSS: 25 cc  FLUORO TIME: 0.2 minutes  CONTRAST: 0 cc  FINDING(S): 1.  Patent right internal jugular vein  SPECIMEN(S):  None  INDICATIONS:   ANES Richard Chen is a 60 y.o. male who presents with progressive renal failure now felt to have end-stage renal disease.  The patient needs long term dialysis access for their ESRD, and a Permcath is necessary.  Risks and benefits are discussed and informed consent is obtained.    DESCRIPTION: After obtaining full informed written consent, the patient was brought back to the vascular suited. The patient's right neck and chest were sterilely prepped and draped in a sterile surgical field was created. Moderate conscious sedation was administered during a face to face encounter with the patient throughout the procedure with my supervision of the RN administering medicines and monitoring the patient's vital signs, pulse oximetry, telemetry and mental status throughout from the start of the procedure until the patient was taken to the recovery room.  The right internal jugular vein was visualized with ultrasound and found to be patent. It was then accessed under direct ultrasound guidance and a permanent image was recorded. A wire was placed. After skin nick and dilatation, the peel-away sheath was placed over the wire. I then turned my attention to an area under the clavicle. Approximately 1-2 fingerbreadths below the clavicle a small counterincision  was created and tunneled from the subclavicular incision to the access site. Using fluoroscopic guidance, a 19 centimeter tip to cuff tunneled hemodialysis catheter was selected, and tunneled from the subclavicular incision to the access site. It was then placed through the peel-away sheath and the peel-away sheath was removed. Using fluoroscopic guidance the catheter tips were parked in the right atrium. The appropriate distal connectors were placed. It withdrew blood well and flushed easily with heparinized saline and a concentrated heparin solution was then placed. It was secured to the chest wall with 2 Prolene sutures. The access incision was closed single 4-0 Monocryl. A 4-0 Monocryl pursestring suture was placed around the exit site. Sterile dressings were placed. The patient tolerated the procedure well and was taken to the recovery room in stable condition.  COMPLICATIONS: None  CONDITION: Stable  DEW,JASON  03/26/2016, 9:32 AM

## 2016-03-26 NOTE — Discharge Instructions (Signed)
Tunneled Catheter Insertion Catheters are thin, flexible tubes that are inserted into a vein to provide access to the bloodstream. A tunneled catheter is used when a person's bloodstream needs to be accessed many times over a long period, usually longer than 30 days. The catheter provides a painless method of drawing blood, giving blood products, removing waste products from the blood (hemodialysis), and giving medicines. Tunneled catheters can be placed in different parts of the body depending on how they will be used. These catheters are secure and easy to access. A part of the catheter is tunneled under the skin. This is done to decrease the risk of infection.  There are various types of tunneled catheters. The specific one used will depend on your needs. The catheter can be used right after insertion. LET [facility] CARE PROVIDER KNOW ABOUT:   Any allergies you have.  All medicines you are taking, including vitamins, herbs, eyedrops, and over-the-counter medicines and creams.   Previous problems you or members of your family have had with the use of anesthetics.   Any blood disorders you have had.  Possibility of pregnancy, if this applies.   Other health problems you have. Also, let your health care provider know if you have a pacemaker. RISKS AND COMPLICATIONS Generally, tunneled catheter insertion is a safe procedure. However, as with any surgical procedure, complications can occur. Possible complications include:  Damage to the blood vessel.   Bruising or bleeding at the site of puncture.   Introduction of the catheter into an artery instead of a vein.   Skin infection at the site of catheter insertion.   Bloodstream infection, especially if your white blood cell count is low.   Developing a kink in the catheter so it does not work properly.   Developing a hole or crack in the catheter.   Blockage of the catheter.   Getting air in the catheter.  Blood clots  around the catheter or in the vein near the catheter.  Disturbance in the normal heart rhythm (rare). This is usually temporary.   A collapsed lung during insertion (rare).  BEFORE THE PROCEDURE   You may need to have blood tests done before the day of the procedure.   Do not eat or drink anything for at least 8 hours before the procedure or as directed by your health care provider.   Ask your health care provider about changing or stopping your regular medicines.  Avoid wearing jewelry the day of the procedure.   Make plans to have someone drive you home after the procedure. You should not drive immediately after the procedure.  PROCEDURE  You will be asked to lie on your back.  A regular intravenous (IV) access tube may be put into a vein in your hand or arm. During the procedure, medicine can flow directly into your body through the IV tube.  Small monitors will be put on your body. They are used to check your heart, blood pressure, and oxygen level.  The catheter site is usually shaved, cleaned, and covered with a sterile drape.  You will be given medicine to numb the area where the catheter will be placed (local anesthetic). You may also be given a medicine to help you relax (sedative).  The health care provider will then make a small incision in the skin, usually in the lower neck. Another small incision is made a little lower, usually on the shoulder or upper chest. Ultrasonography may be used so that the health  care provider can see the vein and can properly guide the catheter placement.  X-ray equipment may also be used to help ensure that the catheter is inserted safely and is placed where it will function most effectively. This equipment allows the health care provider to watch the catheter on a live display while guiding it into place.  Generally, a small guidewire is put into the vein first. A tunnel is created under the skin. The health care provider guides the  movement of the guidewire into a larger vein closer to the heart.  The catheter is pulled through the tunnel and then moved into the larger vein. The cuff on the catheter is located in the tunnel part. The cuff helps to anchor the catheter in place over time. Stitches are used to keep the catheter in place when first put in.  An X-ray may be done to make sure the catheter is in the right place. AFTER THE PROCEDURE   You may stay in a recovery area until the sedation has worn off.  Your heart rate, blood pressure and oxygen level will be monitored.  You may have some pain and swelling in the neck or shoulder. You will likely be given medicine to control this.  If this was done as an outpatient procedure, you may be able to go home the same day.   This information is not intended to replace advice given to you by your health care provider. Make sure you discuss any questions you have with your health care provider.   Document Released: 10/28/2007 Document Revised: 10/29/2014 Document Reviewed: 08/20/2012 Elsevier Interactive Patient Education [employer].

## 2016-03-28 ENCOUNTER — Inpatient Hospital Stay
Admission: RE | Admit: 2016-03-28 | Payer: Commercial Managed Care - HMO | Source: Ambulatory Visit | Admitting: Nephrology

## 2016-03-30 DIAGNOSIS — Z23 Encounter for immunization: Secondary | ICD-10-CM | POA: Diagnosis not present

## 2016-03-30 DIAGNOSIS — N183 Chronic kidney disease, stage 3 (moderate): Secondary | ICD-10-CM | POA: Diagnosis not present

## 2016-03-30 DIAGNOSIS — N179 Acute kidney failure, unspecified: Secondary | ICD-10-CM | POA: Diagnosis not present

## 2016-03-30 DIAGNOSIS — N189 Chronic kidney disease, unspecified: Secondary | ICD-10-CM | POA: Diagnosis not present
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
## 2016-04-02 DIAGNOSIS — Z23 Encounter for immunization: Secondary | ICD-10-CM | POA: Diagnosis not present

## 2016-04-02 DIAGNOSIS — N183 Chronic kidney disease, stage 3 (moderate): Secondary | ICD-10-CM | POA: Diagnosis not present

## 2016-04-02 DIAGNOSIS — N189 Chronic kidney disease, unspecified: Secondary | ICD-10-CM | POA: Diagnosis not present

## 2016-04-02 DIAGNOSIS — N179 Acute kidney failure, unspecified: Secondary | ICD-10-CM | POA: Diagnosis not present

## 2016-04-04 DIAGNOSIS — N183 Chronic kidney disease, stage 3 (moderate): Secondary | ICD-10-CM | POA: Diagnosis not present

## 2016-04-04 DIAGNOSIS — N179 Acute kidney failure, unspecified: Secondary | ICD-10-CM | POA: Diagnosis not present

## 2016-04-04 DIAGNOSIS — N189 Chronic kidney disease, unspecified: Secondary | ICD-10-CM | POA: Diagnosis not present

## 2016-04-04 DIAGNOSIS — Z23 Encounter for immunization: Secondary | ICD-10-CM | POA: Diagnosis not present

## 2016-04-06 DIAGNOSIS — N189 Chronic kidney disease, unspecified: Secondary | ICD-10-CM | POA: Diagnosis not present

## 2016-04-06 DIAGNOSIS — N183 Chronic kidney disease, stage 3 (moderate): Secondary | ICD-10-CM | POA: Diagnosis not present

## 2016-04-06 DIAGNOSIS — Z23 Encounter for immunization: Secondary | ICD-10-CM | POA: Diagnosis not present

## 2016-04-06 DIAGNOSIS — N179 Acute kidney failure, unspecified: Secondary | ICD-10-CM | POA: Diagnosis not present

## 2016-04-09 DIAGNOSIS — N179 Acute kidney failure, unspecified: Secondary | ICD-10-CM | POA: Diagnosis not present

## 2016-04-09 DIAGNOSIS — N183 Chronic kidney disease, stage 3 (moderate): Secondary | ICD-10-CM | POA: Diagnosis not present

## 2016-04-09 DIAGNOSIS — Z23 Encounter for immunization: Secondary | ICD-10-CM | POA: Diagnosis not present

## 2016-04-09 DIAGNOSIS — N189 Chronic kidney disease, unspecified: Secondary | ICD-10-CM | POA: Diagnosis not present

## 2016-04-09 DIAGNOSIS — R17 Unspecified jaundice: Secondary | ICD-10-CM | POA: Diagnosis not present

## 2016-04-09 DIAGNOSIS — E119 Type 2 diabetes mellitus without complications: Secondary | ICD-10-CM | POA: Diagnosis not present

## 2016-04-11 DIAGNOSIS — N189 Chronic kidney disease, unspecified: Secondary | ICD-10-CM | POA: Diagnosis not present

## 2016-04-11 DIAGNOSIS — N183 Chronic kidney disease, stage 3 (moderate): Secondary | ICD-10-CM | POA: Diagnosis not present

## 2016-04-11 DIAGNOSIS — Z23 Encounter for immunization: Secondary | ICD-10-CM | POA: Diagnosis not present

## 2016-04-11 DIAGNOSIS — E119 Type 2 diabetes mellitus without complications: Secondary | ICD-10-CM | POA: Diagnosis not present

## 2016-04-11 DIAGNOSIS — Z992 Dependence on renal dialysis: Secondary | ICD-10-CM | POA: Diagnosis not present

## 2016-04-11 DIAGNOSIS — N179 Acute kidney failure, unspecified: Secondary | ICD-10-CM | POA: Diagnosis not present

## 2016-04-11 DIAGNOSIS — N186 End stage renal disease: Secondary | ICD-10-CM | POA: Diagnosis not present

## 2016-04-11 DIAGNOSIS — I10 Essential (primary) hypertension: Secondary | ICD-10-CM | POA: Diagnosis not present

## 2016-04-13 DIAGNOSIS — N183 Chronic kidney disease, stage 3 (moderate): Secondary | ICD-10-CM | POA: Diagnosis not present

## 2016-04-13 DIAGNOSIS — N189 Chronic kidney disease, unspecified: Secondary | ICD-10-CM | POA: Diagnosis not present

## 2016-04-13 DIAGNOSIS — Z23 Encounter for immunization: Secondary | ICD-10-CM | POA: Diagnosis not present

## 2016-04-13 DIAGNOSIS — N179 Acute kidney failure, unspecified: Secondary | ICD-10-CM | POA: Diagnosis not present

## 2016-04-16 ENCOUNTER — Other Ambulatory Visit: Payer: Self-pay | Admitting: *Deleted

## 2016-04-16 DIAGNOSIS — N179 Acute kidney failure, unspecified: Secondary | ICD-10-CM | POA: Diagnosis not present

## 2016-04-16 DIAGNOSIS — Z23 Encounter for immunization: Secondary | ICD-10-CM | POA: Diagnosis not present

## 2016-04-16 DIAGNOSIS — N183 Chronic kidney disease, stage 3 (moderate): Secondary | ICD-10-CM | POA: Diagnosis not present

## 2016-04-16 DIAGNOSIS — N189 Chronic kidney disease, unspecified: Secondary | ICD-10-CM | POA: Diagnosis not present

## 2016-04-16 MED ORDER — FEXOFENADINE HCL 180 MG PO TABS: 180.0000 mg | ORAL_TABLET | Freq: Every day | ORAL | Status: DC

## 2016-04-16 MED ORDER — AMLODIPINE BESYLATE 10 MG PO TABS: 10.0000 mg | ORAL_TABLET | Freq: Every day | ORAL | Status: DC

## 2016-04-16 MED ORDER — FLUTICASONE PROPIONATE 50 MCG/ACT NA SUSP: NASAL | Status: DC

## 2016-04-16 MED ORDER — ATORVASTATIN CALCIUM 20 MG PO TABS: 20.0000 mg | ORAL_TABLET | Freq: Every day | ORAL | Status: DC

## 2016-04-16 MED ORDER — ALLOPURINOL 300 MG PO TABS: 300.0000 mg | ORAL_TABLET | Freq: Every day | ORAL | Status: DC

## 2016-04-16 MED ORDER — METOPROLOL SUCCINATE ER 25 MG PO TB24: 25.0000 mg | ORAL_TABLET | Freq: Every day | ORAL | Status: DC

## 2016-04-16 MED ORDER — CITALOPRAM HYDROBROMIDE 20 MG PO TABS: 20.0000 mg | ORAL_TABLET | Freq: Every day | ORAL | Status: DC

## 2016-04-16 MED ORDER — COLCHICINE 0.6 MG PO TABS: ORAL_TABLET | ORAL | Status: DC

## 2016-04-18 DIAGNOSIS — N183 Chronic kidney disease, stage 3 (moderate): Secondary | ICD-10-CM | POA: Diagnosis not present

## 2016-04-18 DIAGNOSIS — Z992 Dependence on renal dialysis: Secondary | ICD-10-CM | POA: Diagnosis not present

## 2016-04-18 DIAGNOSIS — Z23 Encounter for immunization: Secondary | ICD-10-CM | POA: Diagnosis not present

## 2016-04-18 DIAGNOSIS — N179 Acute kidney failure, unspecified: Secondary | ICD-10-CM | POA: Diagnosis not present

## 2016-04-18 DIAGNOSIS — N189 Chronic kidney disease, unspecified: Secondary | ICD-10-CM | POA: Diagnosis not present

## 2016-04-18 DIAGNOSIS — N186 End stage renal disease: Secondary | ICD-10-CM | POA: Diagnosis not present

## 2016-04-19 ENCOUNTER — Other Ambulatory Visit: Payer: Self-pay | Admitting: Vascular Surgery

## 2016-04-19 ENCOUNTER — Telehealth: Payer: Self-pay

## 2016-04-19 NOTE — Telephone Encounter (Signed)
Received a fax asking about the safety of the patient's allopurinol/colchicine use. Form in Dr Alla German InBox on his desk to decide what needs to be done, if anything.

## 2016-04-19 NOTE — Telephone Encounter (Signed)
No changes He is now on dialysis---rarely takes colchicine Will just monitor

## 2016-04-19 NOTE — Telephone Encounter (Signed)
Form faxed back.

## 2016-04-20 ENCOUNTER — Encounter
Admission: RE | Admit: 2016-04-20 | Discharge: 2016-04-20 | Disposition: A | Discharge status: Home / Self Care | Payer: Commercial Managed Care - HMO | Source: Ambulatory Visit | Attending: Vascular Surgery | Admitting: Vascular Surgery

## 2016-04-20 ENCOUNTER — Other Ambulatory Visit: Payer: Commercial Managed Care - HMO

## 2016-04-20 DIAGNOSIS — Z01810 Encounter for preprocedural cardiovascular examination: Secondary | ICD-10-CM | POA: Insufficient documentation

## 2016-04-20 DIAGNOSIS — N179 Acute kidney failure, unspecified: Secondary | ICD-10-CM | POA: Diagnosis not present

## 2016-04-20 DIAGNOSIS — Z01812 Encounter for preprocedural laboratory examination: Secondary | ICD-10-CM | POA: Diagnosis not present

## 2016-04-20 DIAGNOSIS — N186 End stage renal disease: Secondary | ICD-10-CM | POA: Diagnosis not present

## 2016-04-20 DIAGNOSIS — N189 Chronic kidney disease, unspecified: Secondary | ICD-10-CM | POA: Diagnosis not present

## 2016-04-20 DIAGNOSIS — Z992 Dependence on renal dialysis: Secondary | ICD-10-CM | POA: Diagnosis not present

## 2016-04-20 DIAGNOSIS — Z23 Encounter for immunization: Secondary | ICD-10-CM | POA: Diagnosis not present

## 2016-04-20 DIAGNOSIS — N183 Chronic kidney disease, stage 3 (moderate): Secondary | ICD-10-CM | POA: Diagnosis not present

## 2016-04-20 HISTORY — DX: Anxiety disorder, unspecified: F41.9

## 2016-04-20 HISTORY — DX: Anemia, unspecified: D64.9

## 2016-04-20 LAB — CBC WITH DIFFERENTIAL/PLATELET
BASOS PCT: 2 %
Basophils Absolute: 0.1 10*3/uL (ref 0–0.1)
Eosinophils Absolute: 0.1 10*3/uL (ref 0–0.7)
Eosinophils Relative: 2 %
HEMATOCRIT: 31.6 % — AB (ref 40.0–52.0)
HEMOGLOBIN: 10.2 g/dL — AB (ref 13.0–18.0)
LYMPHS ABS: 1 10*3/uL (ref 1.0–3.6)
Lymphocytes Relative: 17 %
MCH: 29.4 pg (ref 26.0–34.0)
MCHC: 32.2 g/dL (ref 32.0–36.0)
MCV: 91.3 fL (ref 80.0–100.0)
MONOS PCT: 14 %
Monocytes Absolute: 0.8 10*3/uL (ref 0.2–1.0)
NEUTROS ABS: 3.7 10*3/uL (ref 1.4–6.5)
NEUTROS PCT: 65 %
Platelets: 175 10*3/uL (ref 150–440)
RBC: 3.46 MIL/uL — ABNORMAL LOW (ref 4.40–5.90)
RDW: 14.9 % — ABNORMAL HIGH (ref 11.5–14.5)
WBC: 5.6 10*3/uL (ref 3.8–10.6)

## 2016-04-20 LAB — PROTIME-INR
INR: 0.97
Prothrombin Time: 13.1 seconds (ref 11.4–15.0)

## 2016-04-20 LAB — BASIC METABOLIC PANEL
ANION GAP: 8 (ref 5–15)
BUN: 20 mg/dL (ref 6–20)
CALCIUM: 8.3 mg/dL — AB (ref 8.9–10.3)
CHLORIDE: 98 mmol/L — AB (ref 101–111)
CO2: 31 mmol/L (ref 22–32)
Creatinine, Ser: 2.92 mg/dL — ABNORMAL HIGH (ref 0.61–1.24)
GFR calc non Af Amer: 22 mL/min — ABNORMAL LOW (ref >60)
GFR, EST AFRICAN AMERICAN: 25 mL/min — AB (ref >60)
Glucose, Bld: 76 mg/dL (ref 65–99)
POTASSIUM: 2.9 mmol/L — AB (ref 3.5–5.1)
Sodium: 137 mmol/L (ref 135–145)

## 2016-04-20 LAB — TYPE AND SCREEN
ABO/RH(D): O POS
Antibody Screen: NEGATIVE

## 2016-04-20 LAB — SURGICAL PCR SCREEN
MRSA, PCR: NEGATIVE
STAPHYLOCOCCUS AUREUS: NEGATIVE

## 2016-04-20 LAB — APTT: aPTT: 29 seconds (ref 24–36)

## 2016-04-20 NOTE — Patient Instructions (Signed)
  Your procedure is scheduled on: April 26, 2016 (Thursday) Report to Same Day Surgery 2nd floor [facility] To find out your arrival time please call [phone] between 1PM - 3PM on April 25, 2016 (Wednesday)  Remember: Instructions that are not followed completely may result in serious medical risk, up to and including death, or upon the discretion of your surgeon and anesthesiologist your surgery may need to be rescheduled.    _x___ 1. Do not eat food or drink liquids after midnight. No gum chewing or hard candies.     __x__ 2. No Alcohol for 24 hours before or after surgery.   __x__3. No Smoking for 24 prior to surgery.   ____  4. Bring all medications with you on the day of surgery if instructed.    __x__ 5. Notify your doctor if there is any change in your medical condition     (cold, fever, infections).     Do not wear jewelry, make-up, hairpins, clips or nail polish.  Do not wear lotions, powders, or perfumes. You may wear deodorant.  Do not shave 48 hours prior to surgery. Men may shave face and neck.  Do not bring valuables to the hospital.    [facility] is not responsible for any belongings or valuables.               Contacts, dentures or bridgework may not be worn into surgery.  Leave your suitcase in the car. After surgery it may be brought to your room.  For patients admitted to the hospital, discharge time is determined by your treatment team.   Patients discharged the day of surgery will not be allowed to drive home.    Please read over the following fact sheets that you were given:   [facility] Preparing for Surgery and or MRSA Information   _x___ Take these medicines the morning of surgery with A SIP OF WATER:    1. AMLODIPINE  2. ATORVASTATIN  3. HYDRALAZINE  4. LOSARTAN  5. METOPROLOL  6.  ____ Fleet Enema (as directed)   _x___ Use CHG Soap or sage wipes as directed on instruction sheet   ____ Use inhalers on the day of surgery and bring to  hospital day of surgery  ____ Stop metformin 2 days prior to surgery    ____ Take 1/2 of usual insulin dose the night before surgery and none on the morning of surgery             ____ Stop aspirin or coumadin, or plavix  _x__ Stop Anti-inflammatories such as Advil, Aleve, Ibuprofen, Motrin, Naproxen,          Naprosyn, Goodies powders or aspirin products. Ok to take Tylenol.   ____ Stop supplements until after surgery.    ____ Bring C-Pap to the hospital.

## 2016-04-23 DIAGNOSIS — Z992 Dependence on renal dialysis: Secondary | ICD-10-CM | POA: Diagnosis not present

## 2016-04-23 DIAGNOSIS — N183 Chronic kidney disease, stage 3 (moderate): Secondary | ICD-10-CM | POA: Diagnosis not present

## 2016-04-23 DIAGNOSIS — N186 End stage renal disease: Secondary | ICD-10-CM | POA: Diagnosis not present

## 2016-04-23 DIAGNOSIS — N189 Chronic kidney disease, unspecified: Secondary | ICD-10-CM | POA: Diagnosis not present

## 2016-04-23 DIAGNOSIS — N179 Acute kidney failure, unspecified: Secondary | ICD-10-CM | POA: Diagnosis not present

## 2016-04-25 DIAGNOSIS — N183 Chronic kidney disease, stage 3 (moderate): Secondary | ICD-10-CM | POA: Diagnosis not present

## 2016-04-25 DIAGNOSIS — N179 Acute kidney failure, unspecified: Secondary | ICD-10-CM | POA: Diagnosis not present

## 2016-04-25 DIAGNOSIS — N189 Chronic kidney disease, unspecified: Secondary | ICD-10-CM | POA: Diagnosis not present

## 2016-04-25 DIAGNOSIS — Z992 Dependence on renal dialysis: Secondary | ICD-10-CM | POA: Diagnosis not present

## 2016-04-25 DIAGNOSIS — N186 End stage renal disease: Secondary | ICD-10-CM | POA: Diagnosis not present

## 2016-04-26 ENCOUNTER — Encounter
Admission: RE | Disposition: A | Discharge status: Home / Self Care | Payer: Self-pay | Source: Ambulatory Visit | Attending: Vascular Surgery

## 2016-04-26 ENCOUNTER — Ambulatory Visit
Admission: RE | Admit: 2016-04-26 | Discharge: 2016-04-26 | Disposition: A | Discharge status: Home / Self Care | Payer: Commercial Managed Care - HMO | Source: Ambulatory Visit | Attending: Vascular Surgery | Admitting: Vascular Surgery

## 2016-04-26 ENCOUNTER — Ambulatory Visit: Payer: Commercial Managed Care - HMO | Admitting: Anesthesiology

## 2016-04-26 ENCOUNTER — Encounter: Payer: Self-pay | Admitting: Emergency Medicine

## 2016-04-26 ENCOUNTER — Encounter: Payer: Self-pay | Admitting: *Deleted

## 2016-04-26 DIAGNOSIS — N186 End stage renal disease: Secondary | ICD-10-CM | POA: Diagnosis not present

## 2016-04-26 DIAGNOSIS — E1122 Type 2 diabetes mellitus with diabetic chronic kidney disease: Secondary | ICD-10-CM | POA: Insufficient documentation

## 2016-04-26 DIAGNOSIS — E119 Type 2 diabetes mellitus without complications: Secondary | ICD-10-CM | POA: Diagnosis not present

## 2016-04-26 DIAGNOSIS — Z9109 Other allergy status, other than to drugs and biological substances: Secondary | ICD-10-CM | POA: Diagnosis not present

## 2016-04-26 DIAGNOSIS — Z91013 Allergy to seafood: Secondary | ICD-10-CM | POA: Insufficient documentation

## 2016-04-26 DIAGNOSIS — F329 Major depressive disorder, single episode, unspecified: Secondary | ICD-10-CM

## 2016-04-26 DIAGNOSIS — N189 Chronic kidney disease, unspecified: Secondary | ICD-10-CM | POA: Insufficient documentation

## 2016-04-26 DIAGNOSIS — I10 Essential (primary) hypertension: Secondary | ICD-10-CM | POA: Diagnosis not present

## 2016-04-26 DIAGNOSIS — I509 Heart failure, unspecified: Secondary | ICD-10-CM

## 2016-04-26 DIAGNOSIS — I132 Hypertensive heart and chronic kidney disease with heart failure and with stage 5 chronic kidney disease, or end stage renal disease: Secondary | ICD-10-CM | POA: Insufficient documentation

## 2016-04-26 DIAGNOSIS — R339 Retention of urine, unspecified: Secondary | ICD-10-CM

## 2016-04-26 DIAGNOSIS — Z992 Dependence on renal dialysis: Secondary | ICD-10-CM | POA: Insufficient documentation

## 2016-04-26 DIAGNOSIS — I130 Hypertensive heart and chronic kidney disease with heart failure and stage 1 through stage 4 chronic kidney disease, or unspecified chronic kidney disease: Secondary | ICD-10-CM

## 2016-04-26 DIAGNOSIS — Z7984 Long term (current) use of oral hypoglycemic drugs: Secondary | ICD-10-CM | POA: Insufficient documentation

## 2016-04-26 DIAGNOSIS — E7800 Pure hypercholesterolemia, unspecified: Secondary | ICD-10-CM | POA: Diagnosis not present

## 2016-04-26 DIAGNOSIS — M109 Gout, unspecified: Secondary | ICD-10-CM | POA: Insufficient documentation

## 2016-04-26 DIAGNOSIS — Z91041 Radiographic dye allergy status: Secondary | ICD-10-CM | POA: Insufficient documentation

## 2016-04-26 DIAGNOSIS — Z79899 Other long term (current) drug therapy: Secondary | ICD-10-CM | POA: Insufficient documentation

## 2016-04-26 DIAGNOSIS — G43909 Migraine, unspecified, not intractable, without status migrainosus: Secondary | ICD-10-CM | POA: Diagnosis not present

## 2016-04-26 DIAGNOSIS — Z8349 Family history of other endocrine, nutritional and metabolic diseases: Secondary | ICD-10-CM | POA: Insufficient documentation

## 2016-04-26 DIAGNOSIS — Z9889 Other specified postprocedural states: Secondary | ICD-10-CM | POA: Diagnosis not present

## 2016-04-26 DIAGNOSIS — E785 Hyperlipidemia, unspecified: Secondary | ICD-10-CM

## 2016-04-26 DIAGNOSIS — Z7951 Long term (current) use of inhaled steroids: Secondary | ICD-10-CM | POA: Diagnosis not present

## 2016-04-26 DIAGNOSIS — Z833 Family history of diabetes mellitus: Secondary | ICD-10-CM | POA: Insufficient documentation

## 2016-04-26 DIAGNOSIS — Z8249 Family history of ischemic heart disease and other diseases of the circulatory system: Secondary | ICD-10-CM | POA: Diagnosis not present

## 2016-04-26 DIAGNOSIS — Z886 Allergy status to analgesic agent status: Secondary | ICD-10-CM | POA: Insufficient documentation

## 2016-04-26 DIAGNOSIS — I120 Hypertensive chronic kidney disease with stage 5 chronic kidney disease or end stage renal disease: Secondary | ICD-10-CM | POA: Diagnosis not present

## 2016-04-26 HISTORY — PX: CAPD INSERTION: SHX5233

## 2016-04-26 LAB — GLUCOSE, CAPILLARY
GLUCOSE-CAPILLARY: 124 mg/dL — AB (ref 65–99)
Glucose-Capillary: 117 mg/dL — ABNORMAL HIGH (ref 65–99)

## 2016-04-26 LAB — POCT I-STAT 4, (NA,K, GLUC, HGB,HCT)
Glucose, Bld: 128 mg/dL — ABNORMAL HIGH (ref 65–99)
HCT: 33 % — ABNORMAL LOW (ref 39.0–52.0)
Hemoglobin: 11.2 g/dL — ABNORMAL LOW (ref 13.0–17.0)
POTASSIUM: 3.6 mmol/L (ref 3.5–5.1)
SODIUM: 141 mmol/L (ref 135–145)

## 2016-04-26 SURGERY — LAPAROSCOPIC INSERTION CONTINUOUS AMBULATORY PERITONEAL DIALYSIS  (CAPD) CATHETER
Anesthesia: General | Wound class: Clean

## 2016-04-26 MED ORDER — FENTANYL CITRATE (PF) 100 MCG/2ML IJ SOLN
INTRAMUSCULAR | Status: AC
  Administered 2016-04-26: 25 ug via INTRAVENOUS
  Filled 2016-04-26: qty 2

## 2016-04-26 MED ORDER — ROCURONIUM BROMIDE 100 MG/10ML IV SOLN
INTRAVENOUS | Status: DC | PRN
  Administered 2016-04-26: 40 mg via INTRAVENOUS

## 2016-04-26 MED ORDER — LIDOCAINE HCL (CARDIAC) 20 MG/ML IV SOLN
INTRAVENOUS | Status: DC | PRN
  Administered 2016-04-26: 40 mg via INTRAVENOUS

## 2016-04-26 MED ORDER — NEOSTIGMINE METHYLSULFATE 10 MG/10ML IV SOLN
INTRAVENOUS | Status: DC | PRN
  Administered 2016-04-26: 5 mg via INTRAVENOUS

## 2016-04-26 MED ORDER — CHLORHEXIDINE GLUCONATE CLOTH 2 % EX PADS
6.0000 | MEDICATED_PAD | Freq: Once | CUTANEOUS | Status: AC
  Administered 2016-04-26: 6 via TOPICAL

## 2016-04-26 MED ORDER — GLYCOPYRROLATE 0.2 MG/ML IJ SOLN
INTRAMUSCULAR | Status: DC | PRN
  Administered 2016-04-26: .8 mg via INTRAVENOUS

## 2016-04-26 MED ORDER — FENTANYL CITRATE (PF) 100 MCG/2ML IJ SOLN
INTRAMUSCULAR | Status: DC | PRN
  Administered 2016-04-26: 100 ug via INTRAVENOUS

## 2016-04-26 MED ORDER — CEFAZOLIN SODIUM-DEXTROSE 2-4 GM/100ML-% IV SOLN: 2.0000 g | INTRAVENOUS | Status: DC

## 2016-04-26 MED ORDER — EPHEDRINE SULFATE 50 MG/ML IJ SOLN
INTRAMUSCULAR | Status: DC | PRN
  Administered 2016-04-26: 5 mg via INTRAVENOUS

## 2016-04-26 MED ORDER — OXYCODONE HCL 5 MG PO TABS
ORAL_TABLET | ORAL | Status: AC
  Filled 2016-04-26: qty 1

## 2016-04-26 MED ORDER — ROCURONIUM BROMIDE 100 MG/10ML IV SOLN: INTRAVENOUS | Status: DC | PRN

## 2016-04-26 MED ORDER — FENTANYL CITRATE (PF) 100 MCG/2ML IJ SOLN
25.0000 ug | INTRAMUSCULAR | Status: DC | PRN
  Administered 2016-04-26 (×2): 25 ug via INTRAVENOUS

## 2016-04-26 MED ORDER — CEFAZOLIN SODIUM-DEXTROSE 2-4 GM/100ML-% IV SOLN
INTRAVENOUS | Status: AC
  Administered 2016-04-26: 2 g via INTRAVENOUS
  Filled 2016-04-26: qty 100

## 2016-04-26 MED ORDER — OXYCODONE HCL 5 MG/5ML PO SOLN: 5.0000 mg | Freq: Once | ORAL | Status: AC | PRN

## 2016-04-26 MED ORDER — HYDROCODONE-ACETAMINOPHEN 5-325 MG PO TABS: 1.0000 | ORAL_TABLET | Freq: Four times a day (QID) | ORAL | Status: DC | PRN

## 2016-04-26 MED ORDER — PROPOFOL 10 MG/ML IV BOLUS
INTRAVENOUS | Status: DC | PRN
  Administered 2016-04-26: 130 mg via INTRAVENOUS

## 2016-04-26 MED ORDER — LABETALOL HCL 5 MG/ML IV SOLN
INTRAVENOUS | Status: DC | PRN
  Administered 2016-04-26: 2.5 mg via INTRAVENOUS

## 2016-04-26 MED ORDER — FAMOTIDINE 20 MG PO TABS
ORAL_TABLET | ORAL | Status: AC
  Administered 2016-04-26: 20 mg via ORAL
  Filled 2016-04-26: qty 1

## 2016-04-26 MED ORDER — SODIUM CHLORIDE 0.9 % IV SOLN
INTRAVENOUS | Status: DC
  Administered 2016-04-26 (×2): via INTRAVENOUS

## 2016-04-26 MED ORDER — OXYCODONE HCL 5 MG PO TABS
5.0000 mg | ORAL_TABLET | Freq: Once | ORAL | Status: AC | PRN
  Administered 2016-04-26: 5 mg via ORAL

## 2016-04-26 MED ORDER — ONDANSETRON HCL 4 MG/2ML IJ SOLN
INTRAMUSCULAR | Status: DC | PRN
  Administered 2016-04-26: 4 mg via INTRAVENOUS

## 2016-04-26 MED ORDER — MIDAZOLAM HCL 2 MG/2ML IJ SOLN
INTRAMUSCULAR | Status: DC | PRN
  Administered 2016-04-26: 2 mg via INTRAVENOUS

## 2016-04-26 MED ORDER — FAMOTIDINE 20 MG PO TABS
20.0000 mg | ORAL_TABLET | Freq: Once | ORAL | Status: AC
  Administered 2016-04-26: 20 mg via ORAL

## 2016-04-26 SURGICAL SUPPLY — 32 items
ADAPTER BETA CAP QUINTON DIALY (ADAPTER) ×2 IMPLANT
ADAPTER CATH DIALYSIS 18.75 (CATHETERS) ×2 IMPLANT
CANISTER SUCT 1200ML W/VALVE (MISCELLANEOUS) ×2 IMPLANT
CATH DLYS SWAN NECK 62.5CM (CATHETERS) ×2 IMPLANT
CHLORAPREP W/TINT 26ML (MISCELLANEOUS) ×2 IMPLANT
ELECT CAUTERY BLADE 6.4 (BLADE) ×2 IMPLANT
ELECT REM PT RETURN 9FT ADLT (ELECTROSURGICAL) ×2
ELECTRODE REM PT RTRN 9FT ADLT (ELECTROSURGICAL) ×1 IMPLANT
GLOVE BIO SURGEON STRL SZ7 (GLOVE) ×4 IMPLANT
GLOVE EXAM NITRILE PF MED BLUE (GLOVE) ×2 IMPLANT
GLOVE INDICATOR 7.5 STRL GRN (GLOVE) ×2 IMPLANT
GLOVE PROTEXIS LATEX SZ 7.5 (GLOVE) ×2 IMPLANT
GOWN STRL REUS W/ TWL LRG LVL3 (GOWN DISPOSABLE) ×2 IMPLANT
GOWN STRL REUS W/ TWL XL LVL3 (GOWN DISPOSABLE) ×1 IMPLANT
GOWN STRL REUS W/TWL LRG LVL3 (GOWN DISPOSABLE) ×2
GOWN STRL REUS W/TWL XL LVL3 (GOWN DISPOSABLE) ×1
IV NS 500ML (IV SOLUTION) ×1
IV NS 500ML BAXH (IV SOLUTION) ×1 IMPLANT
KIT RM TURNOVER STRD PROC AR (KITS) ×2 IMPLANT
LABEL OR SOLS (LABEL) ×2 IMPLANT
LIQUID BAND (GAUZE/BANDAGES/DRESSINGS) ×2 IMPLANT
MINICAP W/POVIDONE IODINE SOL (MISCELLANEOUS) ×2 IMPLANT
PACK LAP CHOLECYSTECTOMY (MISCELLANEOUS) ×2 IMPLANT
PENCIL ELECTRO HAND CTR (MISCELLANEOUS) ×2 IMPLANT
SET CYSTO W/LG BORE CLAMP LF (SET/KITS/TRAYS/PACK) ×2 IMPLANT
SET TRANSFER 6 W/TWIST CLAMP 5 (SET/KITS/TRAYS/PACK) ×2 IMPLANT
SPONGE EXCIL AMD DRAIN 4X4 6P (MISCELLANEOUS) ×2 IMPLANT
SUT MNCRL AB 4-0 PS2 18 (SUTURE) ×2 IMPLANT
SUT VIC AB 2-0 UR6 27 (SUTURE) ×2 IMPLANT
SUT VICRYL+ 3-0 36IN CT-1 (SUTURE) ×2 IMPLANT
TROCAR XCEL NON-BLD 11X100MML (ENDOMECHANICALS) ×2 IMPLANT
TUBING INSUFFLATOR HI FLOW (MISCELLANEOUS) ×2 IMPLANT

## 2016-04-26 NOTE — Anesthesia Postprocedure Evaluation (Deleted)
Anesthesia Post Note  Patient: Richard Chen  Procedure(s) Performed: Procedure(s) (LRB): LAPAROSCOPIC INSERTION CONTINUOUS AMBULATORY PERITONEAL DIALYSIS  (CAPD) CATHETER (N/A)  Patient location during evaluation: PACU Anesthesia Type: General Level of consciousness: awake and alert Pain management: pain level controlled Vital Signs Assessment: post-procedure vital signs reviewed and stable Respiratory status: spontaneous breathing, nonlabored ventilation, respiratory function stable and patient connected to nasal cannula oxygen Cardiovascular status: blood pressure returned to baseline and stable Postop Assessment: no signs of nausea or vomiting Anesthetic complications: no    Last Vitals:  Filed Vitals:   04/26/16 1103  BP: 170/70  Pulse: 57  Temp: 36.6 C  Resp: 20    Last Pain: There were no vitals filed for this visit.               Precious Haws Piscitello

## 2016-04-26 NOTE — H&P (Signed)
  [facility] History & Physical Update  The patient was interviewed and re-examined.  The patient's previous History and Physical has been reviewed and is unchanged.  There is no change in the plan of care. We plan to proceed with the scheduled procedure.  DEW,JASON, MD  04/26/2016, 12:16 PM

## 2016-04-26 NOTE — Discharge Instructions (Signed)

## 2016-04-26 NOTE — Progress Notes (Signed)
zofran given by CRNA for nausea

## 2016-04-26 NOTE — ED Notes (Signed)
Patient reports had PD catheter inserted today, reports has not drained but repots has not been voiding. Pt reports last time voided was 10 am this morning, states he feels like he needs to empty bladder. Pt also c/o vomiting and nausea.Pt alert and oriented x 4, no increased work in breathing noted, skin warm and dry.

## 2016-04-26 NOTE — Anesthesia Procedure Notes (Signed)
Procedure Name: Intubation Date/Time: 04/26/2016 12:30 PM Performed by: Allean Found Pre-anesthesia Checklist: Emergency Drugs available, Patient identified, Suction available, Patient being monitored and Timeout performed Patient Re-evaluated:Patient Re-evaluated prior to inductionOxygen Delivery Method: Circle system utilized Preoxygenation: Pre-oxygenation with 100% oxygen Intubation Type: IV induction Laryngoscope Size: Mac and 3 Grade View: Grade II Tube type: Oral Tube size: 7.0 mm Number of attempts: 1 Airway Equipment and Method: Stylet Placement Confirmation: ETT inserted through vocal cords under direct vision,  positive ETCO2 and breath sounds checked- equal and bilateral Secured at: 22 cm Tube secured with: Tape Dental Injury: Teeth and Oropharynx as per pre-operative assessment  Difficulty Due To: Difficult Airway- due to large tongue

## 2016-04-26 NOTE — Op Note (Signed)
  OPERATIVE NOTE   PROCEDURE: 1. Laparoscopic peritoneal dialysis catheter placement.  PRE-OPERATIVE DIAGNOSIS: 1. ESRD   POST-OPERATIVE DIAGNOSIS: Same  SURGEON: Leotis Pain, MD  ASSISTANT(S): Hezzie Bump, PA-C  ANESTHESIA: general  ESTIMATED BLOOD LOSS: Minimal   FINDING(S): 1. None  SPECIMEN(S): None  INDICATIONS:  Patient presents with renal failure. The patient has decided to do peritoneal dialysis for his long-term dialysis. Risks and benefits of placement were discussed and he is agreeable to proceed.  Differences between peritoneal dialysis and hemodialysis were discussed.    DESCRIPTION: After obtaining full informed written consent, the patient was brought back to the operating room and placed supine upon the operating table. The patient received IV antibiotics prior to induction. After obtaining adequate anesthesia, the abdomen was prepped and draped in the standard fashion. A small transverse incision was created just to the left of the umbilicus and we dissected down to the fascia and placed a pursestring Vicryl suture. I then entered the peritoneum with an 61mm Optiview trocar placed in the right upper quadrant and insufflated the abdomen with carbon dioxide. I then entered the peritoneum just beside the umbilicus with a trocar and the peritoneal dialysis catheter under direct visualization. The coiled portion of the catheter was parked into the pelvis under direct laparoscopic guidance. The deep cuff was secured to the fascial pursestring suture. A small counterincision was made in the left abdomen and the catheter was brought out this site. The appropriate distal connectors were placed, and I then placed 500 cc of saline through the catheter into the pelvis. The abdomen was desufflated. Immediately, over 400 cc of effluent returned through the catheter when the bag was placed to gravity. I took one more look with the camera to ensure that the catheter was in the  pelvis and it was. The 32mm trocar was then removed. I then closed the incisions with 3-0 Vicryl and 4-0 Monocryl and placed Dermabond as dressing. Dry dressing was placed around the catheter exit site. The patient was then awakened from anesthesia and taken to the recovery room in stable condition having tolerated the procedure well.  COMPLICATIONS: None  CONDITION: None  Jashley Yellin  03/02/2015, 8:29 AM

## 2016-04-26 NOTE — Transfer of Care (Signed)
Immediate Anesthesia Transfer of Care Note  Patient: Richard Chen  Procedure(s) Performed: Procedure(s): LAPAROSCOPIC INSERTION CONTINUOUS AMBULATORY PERITONEAL DIALYSIS  (CAPD) CATHETER (N/A)  Patient Location: PACU  Anesthesia Type:General  Level of Consciousness: awake  Airway & Oxygen Therapy: Patient Spontanous Breathing and Patient connected to face mask oxygen  Post-op Assessment: Report given to RN and Post -op Vital signs reviewed and stable  Post vital signs: Reviewed and stable  Last Vitals:  Filed Vitals:   04/26/16 1103  BP: 170/70  Pulse: 57  Temp: 36.6 C  Resp: 20    Last Pain: There were no vitals filed for this visit.       Complications: No apparent anesthesia complications
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
## 2016-04-26 NOTE — ED Notes (Signed)
Bladder scan 653cc

## 2016-04-27 ENCOUNTER — Emergency Department
Admission: EM | Admit: 2016-04-27 | Discharge: 2016-04-27 | Disposition: A | Discharge status: Home / Self Care | Payer: Commercial Managed Care - HMO | Source: Home / Self Care | Attending: Emergency Medicine | Admitting: Emergency Medicine

## 2016-04-27 ENCOUNTER — Telehealth: Payer: Self-pay | Admitting: Urology

## 2016-04-27 DIAGNOSIS — N179 Acute kidney failure, unspecified: Secondary | ICD-10-CM | POA: Diagnosis not present

## 2016-04-27 DIAGNOSIS — Z992 Dependence on renal dialysis: Secondary | ICD-10-CM | POA: Diagnosis not present

## 2016-04-27 DIAGNOSIS — R338 Other retention of urine: Secondary | ICD-10-CM

## 2016-04-27 DIAGNOSIS — N189 Chronic kidney disease, unspecified: Secondary | ICD-10-CM | POA: Diagnosis not present

## 2016-04-27 DIAGNOSIS — N186 End stage renal disease: Secondary | ICD-10-CM | POA: Diagnosis not present

## 2016-04-27 DIAGNOSIS — N183 Chronic kidney disease, stage 3 (moderate): Secondary | ICD-10-CM | POA: Diagnosis not present

## 2016-04-27 LAB — URINALYSIS COMPLETE WITH MICROSCOPIC (ARMC ONLY)
BILIRUBIN URINE: NEGATIVE
Bacteria, UA: NONE SEEN
GLUCOSE, UA: NEGATIVE mg/dL
HGB URINE DIPSTICK: NEGATIVE
KETONES UR: NEGATIVE mg/dL
NITRITE: NEGATIVE
Protein, ur: >500 mg/dL — AB
SPECIFIC GRAVITY, URINE: 1.011 (ref 1.005–1.030)
Squamous Epithelial / LPF: NONE SEEN
pH: 6 (ref 5.0–8.0)

## 2016-04-27 NOTE — Anesthesia Postprocedure Evaluation (Signed)
Anesthesia Post Note  Patient: Jonthomas Anagnos Waldschmidt  Procedure(s) Performed: Procedure(s) (LRB): LAPAROSCOPIC INSERTION CONTINUOUS AMBULATORY PERITONEAL DIALYSIS  (CAPD) CATHETER (N/A)  Patient location during evaluation: PACU Anesthesia Type: General Level of consciousness: awake and alert Pain management: pain level controlled Vital Signs Assessment: post-procedure vital signs reviewed and stable Respiratory status: spontaneous breathing, nonlabored ventilation, respiratory function stable and patient connected to nasal cannula oxygen Cardiovascular status: blood pressure returned to baseline and stable Postop Assessment: no signs of nausea or vomiting Anesthetic complications: no    Last Vitals:  Filed Vitals:   04/26/16 1426 04/26/16 1458  BP: 158/65 163/67  Pulse: 54   Temp:    Resp: 16 16    Last Pain:  Filed Vitals:   04/27/16 0824  PainSc: 0-No pain                 Precious Haws Keairra Bardon

## 2016-04-27 NOTE — Discharge Instructions (Signed)
You were seen in the Emergency Department (ED) for urinary retention.  We placed a Foley catheter to help your bladder drain.  Please read through the included information and follow up with your doctor as recommended in these papers; your doctor will see you in clinic and help you determine when it is time to have the catheter removed.  If you stop producing urine in the bag or if you develop other symptoms that concern you, such as fever, chills, persistent vomiting, or severe abdominal pain, please return immediately to the Emergency Department.   Foley Catheter Care, Adult  A Foley catheter is a soft, flexible tube that is placed into the bladder to drain urine. A Foley catheter may be inserted if:  You leak urine or are not able to control when you urinate (urinary incontinence).  You are not able to urinate when you need to (urinary retention).  You had prostate surgery or surgery on the genitals.  You have certain medical conditions, such as multiple sclerosis, dementia, or a spinal cord injury. If you are going home with a Foley catheter in place, follow the instructions below.  TAKING CARE OF THE CATHETER  1. Wash your hands with soap and water. 2. Using mild soap and warm water on a clean washcloth: Clean the area on your body closest to the catheter insertion site using a circular motion, moving away from the catheter. Never wipe toward the catheter because this could sweep bacteria up into the urethra and cause infection.  Remove all traces of soap. Pat the area dry with a clean towel. For males, reposition the foreskin. 3. Attach the catheter to your leg so there is no tension on the catheter. Use adhesive tape or a leg strap. If you are using adhesive tape, remove any sticky residue left behind by the previous tape you used. 4. Keep the drainage bag below the level of the bladder, but keep it off the floor. 5. Check throughout the day to be sure the catheter is working and urine is  draining freely. Make sure the tubing does not become kinked. 6. Do not pull on the catheter or try to remove it. Pulling could damage internal tissues. TAKING CARE OF THE DRAINAGE BAGS  You will be given two drainage bags to take home. One is a large overnight drainage bag, and the other is a smaller leg bag that fits underneath clothing. You may wear the overnight bag at any time, but you should never wear the smaller leg bag at night. Follow the instructions below for how to empty, change, and clean your drainage bags.  Emptying the Drainage Bag  You must empty your drainage bag when it is - full or at least 2-3 times a day.  1. Wash your hands with soap and water. 2. Keep the drainage bag below your hips, below the level of your bladder. This stops urine from going back into the tubing and into your bladder. 3. Hold the dirty bag over the toilet or a clean container. 4. Open the pour spout at the bottom of the bag and empty the urine into the toilet or container. Do not let the pour spout touch the toilet, container, or any other surface. Doing so can place bacteria on the bag, which can cause an infection. 5. Clean the pour spout with a gauze pad or cotton ball that has rubbing alcohol on it. 6. Close the pour spout. 7. Attach the bag to your leg with adhesive tape  or a leg strap. 8. Wash your hands well. Changing the Drainage Bag  Change your drainage bag once a month or sooner if it starts to smell bad or look dirty. Below are steps to follow when changing the drainage bag.  1. Wash your hands with soap and water. 2. Pinch off the rubber catheter so that urine does not spill out. 3. Disconnect the catheter tube from the drainage tube at the connection valve. Do not let the tubes touch any surface. 4. Clean the end of the catheter tube with an alcohol wipe. Use a different alcohol wipe to clean the end of the drainage tube. 5. Connect the catheter tube to the drainage tube of the clean  drainage bag. 6. Attach the new bag to the leg with adhesive tape or a leg strap. Avoid attaching the new bag too tightly. 7. Wash your hands well. Cleaning the Drainage Bag  1. Wash your hands with soap and water. 2. Wash the bag in warm, soapy water. 3. Rinse the bag thoroughly with warm water. 4. Fill the bag with a solution of white vinegar and water (1 cup vinegar to 1 qt warm water [.2 L vinegar to 1 L warm water]). Close the bag and soak it for 30 minutes in the solution. 5. Rinse the bag with warm water. 6. Hang the bag to dry with the pour spout open and hanging downward. 7. Store the clean bag (once it is dry) in a clean plastic bag. 8. Wash your hands well. PREVENTING INFECTION  Wash your hands before and after handling your catheter.  Take showers daily and wash the area where the catheter enters your body. Do not take baths. Replace wet leg straps with dry ones, if this applies.  Do not use powders, sprays, or lotions on the genital area. Only use creams, lotions, or ointments as directed by your caregiver.  For females, wipe from front to back after each bowel movement.  Drink enough fluids to keep your urine clear or pale yellow unless you have a fluid restriction.  Do not let the drainage bag or tubing touch or lie on the floor.  Wear cotton underwear to absorb moisture and to keep your skin drier. SEEK MEDICAL CARE IF:  Your urine is cloudy or smells unusually bad.  Your catheter becomes clogged.  You are not draining urine into the bag or your bladder feels full.  Your catheter starts to leak. SEEK IMMEDIATE MEDICAL CARE IF:  You have pain, swelling, redness, or pus where the catheter enters the body.  You have pain in the abdomen, legs, lower back, or bladder.  You have a fever.  You see blood fill the catheter, or your urine is pink or red.  You have nausea, vomiting, or chills.  Your catheter gets pulled out. MAKE SURE YOU:  Understand these instructions.  Will  watch your condition.  Will get help right away if you are not doing well or get worse. This information is not intended to replace advice given to you by your health care provider. Make sure you discuss any questions you have with your health care provider.  Document Released: 10/08/2005 Document Revised: 02/22/2014 Document Reviewed: 09/29/2012  Elsevier Interactive Patient Education [employer].

## 2016-04-27 NOTE — Telephone Encounter (Signed)
Pt's wife called today.  They were in ER last night.  He was unable to urinate since yesterday at 10:00 a.m.  They put a catheter in and he was able to go.  Pt is very uncomfortable with cath in.  I told wife it was safe to leave in for 30 days.  Our first new patient appt is not until August.  Can you please give her a call and see if we need to double book and get him in sooner?  Cathy Priore [phone]

## 2016-04-27 NOTE — Telephone Encounter (Signed)
Spoke with pt daughter who stated pt had surgery yesterday and last night went into retention. Catheter was placed in the ER. Made daughter aware we could see if Larene Beach or another MD to get pt seen sooner. Daughter was ok with this. Daughter was transferred to the front to see if appt could be moved.
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
## 2016-04-28 LAB — URINE CULTURE
CULTURE: NO GROWTH
Special Requests: NORMAL

## 2016-04-30 DIAGNOSIS — N189 Chronic kidney disease, unspecified: Secondary | ICD-10-CM | POA: Diagnosis not present

## 2016-04-30 DIAGNOSIS — E119 Type 2 diabetes mellitus without complications: Secondary | ICD-10-CM | POA: Diagnosis not present

## 2016-04-30 DIAGNOSIS — Z992 Dependence on renal dialysis: Secondary | ICD-10-CM | POA: Diagnosis not present

## 2016-04-30 DIAGNOSIS — N179 Acute kidney failure, unspecified: Secondary | ICD-10-CM | POA: Diagnosis not present

## 2016-04-30 DIAGNOSIS — N183 Chronic kidney disease, stage 3 (moderate): Secondary | ICD-10-CM | POA: Diagnosis not present

## 2016-04-30 DIAGNOSIS — N186 End stage renal disease: Secondary | ICD-10-CM | POA: Diagnosis not present

## 2016-05-02 DIAGNOSIS — N179 Acute kidney failure, unspecified: Secondary | ICD-10-CM | POA: Diagnosis not present

## 2016-05-02 DIAGNOSIS — Z992 Dependence on renal dialysis: Secondary | ICD-10-CM | POA: Diagnosis not present

## 2016-05-02 DIAGNOSIS — N189 Chronic kidney disease, unspecified: Secondary | ICD-10-CM | POA: Diagnosis not present

## 2016-05-02 DIAGNOSIS — N183 Chronic kidney disease, stage 3 (moderate): Secondary | ICD-10-CM | POA: Diagnosis not present

## 2016-05-02 DIAGNOSIS — N186 End stage renal disease: Secondary | ICD-10-CM | POA: Diagnosis not present

## 2016-05-04 DIAGNOSIS — Z992 Dependence on renal dialysis: Secondary | ICD-10-CM | POA: Diagnosis not present

## 2016-05-04 DIAGNOSIS — N183 Chronic kidney disease, stage 3 (moderate): Secondary | ICD-10-CM | POA: Diagnosis not present

## 2016-05-04 DIAGNOSIS — N179 Acute kidney failure, unspecified: Secondary | ICD-10-CM | POA: Diagnosis not present

## 2016-05-04 DIAGNOSIS — N189 Chronic kidney disease, unspecified: Secondary | ICD-10-CM | POA: Diagnosis not present

## 2016-05-04 DIAGNOSIS — N186 End stage renal disease: Secondary | ICD-10-CM | POA: Diagnosis not present

## 2016-05-07 DIAGNOSIS — N179 Acute kidney failure, unspecified: Secondary | ICD-10-CM | POA: Diagnosis not present

## 2016-05-07 DIAGNOSIS — N183 Chronic kidney disease, stage 3 (moderate): Secondary | ICD-10-CM | POA: Diagnosis not present

## 2016-05-07 DIAGNOSIS — N186 End stage renal disease: Secondary | ICD-10-CM | POA: Diagnosis not present

## 2016-05-07 DIAGNOSIS — Z992 Dependence on renal dialysis: Secondary | ICD-10-CM | POA: Diagnosis not present

## 2016-05-07 DIAGNOSIS — N189 Chronic kidney disease, unspecified: Secondary | ICD-10-CM | POA: Diagnosis not present

## 2016-05-09 DIAGNOSIS — N186 End stage renal disease: Secondary | ICD-10-CM | POA: Diagnosis not present

## 2016-05-09 DIAGNOSIS — N189 Chronic kidney disease, unspecified: Secondary | ICD-10-CM | POA: Diagnosis not present

## 2016-05-09 DIAGNOSIS — Z992 Dependence on renal dialysis: Secondary | ICD-10-CM | POA: Diagnosis not present

## 2016-05-09 DIAGNOSIS — N179 Acute kidney failure, unspecified: Secondary | ICD-10-CM | POA: Diagnosis not present

## 2016-05-09 DIAGNOSIS — N183 Chronic kidney disease, stage 3 (moderate): Secondary | ICD-10-CM | POA: Diagnosis not present

## 2016-05-10 DIAGNOSIS — I10 Essential (primary) hypertension: Secondary | ICD-10-CM | POA: Diagnosis not present

## 2016-05-10 DIAGNOSIS — E119 Type 2 diabetes mellitus without complications: Secondary | ICD-10-CM | POA: Diagnosis not present

## 2016-05-10 DIAGNOSIS — Z992 Dependence on renal dialysis: Secondary | ICD-10-CM | POA: Diagnosis not present

## 2016-05-10 DIAGNOSIS — Z4889 Encounter for other specified surgical aftercare: Secondary | ICD-10-CM | POA: Diagnosis not present

## 2016-05-10 DIAGNOSIS — N186 End stage renal disease: Secondary | ICD-10-CM | POA: Diagnosis not present

## 2016-05-11 DIAGNOSIS — N179 Acute kidney failure, unspecified: Secondary | ICD-10-CM | POA: Diagnosis not present

## 2016-05-11 DIAGNOSIS — N186 End stage renal disease: Secondary | ICD-10-CM | POA: Diagnosis not present

## 2016-05-11 DIAGNOSIS — N183 Chronic kidney disease, stage 3 (moderate): Secondary | ICD-10-CM | POA: Diagnosis not present

## 2016-05-11 DIAGNOSIS — N189 Chronic kidney disease, unspecified: Secondary | ICD-10-CM | POA: Diagnosis not present

## 2016-05-11 DIAGNOSIS — Z992 Dependence on renal dialysis: Secondary | ICD-10-CM | POA: Diagnosis not present

## 2016-05-14 DIAGNOSIS — N189 Chronic kidney disease, unspecified: Secondary | ICD-10-CM | POA: Diagnosis not present

## 2016-05-14 DIAGNOSIS — N186 End stage renal disease: Secondary | ICD-10-CM | POA: Diagnosis not present

## 2016-05-14 DIAGNOSIS — N179 Acute kidney failure, unspecified: Secondary | ICD-10-CM | POA: Diagnosis not present

## 2016-05-14 DIAGNOSIS — N183 Chronic kidney disease, stage 3 (moderate): Secondary | ICD-10-CM | POA: Diagnosis not present

## 2016-05-14 DIAGNOSIS — Z992 Dependence on renal dialysis: Secondary | ICD-10-CM | POA: Diagnosis not present

## 2016-05-16 DIAGNOSIS — N189 Chronic kidney disease, unspecified: Secondary | ICD-10-CM | POA: Diagnosis not present

## 2016-05-16 DIAGNOSIS — Z992 Dependence on renal dialysis: Secondary | ICD-10-CM | POA: Diagnosis not present

## 2016-05-16 DIAGNOSIS — N186 End stage renal disease: Secondary | ICD-10-CM | POA: Diagnosis not present

## 2016-05-16 DIAGNOSIS — N179 Acute kidney failure, unspecified: Secondary | ICD-10-CM | POA: Diagnosis not present

## 2016-05-16 DIAGNOSIS — N183 Chronic kidney disease, stage 3 (moderate): Secondary | ICD-10-CM | POA: Diagnosis not present

## 2016-05-18 DIAGNOSIS — N183 Chronic kidney disease, stage 3 (moderate): Secondary | ICD-10-CM | POA: Diagnosis not present

## 2016-05-18 DIAGNOSIS — N189 Chronic kidney disease, unspecified: Secondary | ICD-10-CM | POA: Diagnosis not present

## 2016-05-18 DIAGNOSIS — N186 End stage renal disease: Secondary | ICD-10-CM | POA: Diagnosis not present

## 2016-05-18 DIAGNOSIS — N179 Acute kidney failure, unspecified: Secondary | ICD-10-CM | POA: Diagnosis not present

## 2016-05-18 DIAGNOSIS — Z992 Dependence on renal dialysis: Secondary | ICD-10-CM | POA: Diagnosis not present

## 2016-05-21 ENCOUNTER — Encounter: Payer: Self-pay | Admitting: Urology

## 2016-05-21 ENCOUNTER — Ambulatory Visit (INDEPENDENT_AMBULATORY_CARE_PROVIDER_SITE_OTHER): Payer: Commercial Managed Care - HMO | Admitting: Urology

## 2016-05-21 VITALS — BP 147/66 | HR 59 | Ht 73.5 in | Wt 253.7 lb

## 2016-05-21 DIAGNOSIS — N186 End stage renal disease: Secondary | ICD-10-CM | POA: Diagnosis not present

## 2016-05-21 DIAGNOSIS — Z992 Dependence on renal dialysis: Secondary | ICD-10-CM | POA: Diagnosis not present

## 2016-05-21 DIAGNOSIS — N189 Chronic kidney disease, unspecified: Secondary | ICD-10-CM

## 2016-05-21 DIAGNOSIS — R339 Retention of urine, unspecified: Secondary | ICD-10-CM

## 2016-05-21 DIAGNOSIS — N179 Acute kidney failure, unspecified: Secondary | ICD-10-CM | POA: Diagnosis not present

## 2016-05-21 DIAGNOSIS — N183 Chronic kidney disease, stage 3 (moderate): Secondary | ICD-10-CM | POA: Diagnosis not present

## 2016-05-21 DIAGNOSIS — N138 Other obstructive and reflux uropathy: Secondary | ICD-10-CM

## 2016-05-21 DIAGNOSIS — N401 Enlarged prostate with lower urinary tract symptoms: Secondary | ICD-10-CM

## 2016-05-21 MED ORDER — TAMSULOSIN HCL 0.4 MG PO CAPS: 0.4000 mg | ORAL_CAPSULE | Freq: Every day | ORAL | 3 refills | Status: DC
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
## 2016-05-23 DIAGNOSIS — Z992 Dependence on renal dialysis: Secondary | ICD-10-CM | POA: Diagnosis not present

## 2016-05-23 DIAGNOSIS — N186 End stage renal disease: Secondary | ICD-10-CM | POA: Diagnosis not present

## 2016-05-24 DIAGNOSIS — E669 Obesity, unspecified: Secondary | ICD-10-CM | POA: Diagnosis not present

## 2016-05-24 DIAGNOSIS — Z01810 Encounter for preprocedural cardiovascular examination: Secondary | ICD-10-CM | POA: Diagnosis not present

## 2016-05-24 DIAGNOSIS — N186 End stage renal disease: Secondary | ICD-10-CM | POA: Diagnosis not present

## 2016-05-24 DIAGNOSIS — Z87820 Personal history of traumatic brain injury: Secondary | ICD-10-CM | POA: Diagnosis not present

## 2016-05-24 DIAGNOSIS — E1121 Type 2 diabetes mellitus with diabetic nephropathy: Secondary | ICD-10-CM | POA: Diagnosis not present

## 2016-05-24 DIAGNOSIS — Z01818 Encounter for other preprocedural examination: Secondary | ICD-10-CM | POA: Diagnosis not present

## 2016-05-24 DIAGNOSIS — I151 Hypertension secondary to other renal disorders: Secondary | ICD-10-CM | POA: Diagnosis not present

## 2016-05-24 DIAGNOSIS — Z7682 Awaiting organ transplant status: Secondary | ICD-10-CM | POA: Diagnosis not present

## 2016-05-24 DIAGNOSIS — Z992 Dependence on renal dialysis: Secondary | ICD-10-CM | POA: Diagnosis not present

## 2016-05-25 DIAGNOSIS — N186 End stage renal disease: Secondary | ICD-10-CM | POA: Diagnosis not present

## 2016-05-25 DIAGNOSIS — Z992 Dependence on renal dialysis: Secondary | ICD-10-CM | POA: Diagnosis not present

## 2016-05-28 DIAGNOSIS — Z992 Dependence on renal dialysis: Secondary | ICD-10-CM | POA: Diagnosis not present

## 2016-05-28 DIAGNOSIS — N186 End stage renal disease: Secondary | ICD-10-CM | POA: Diagnosis not present

## 2016-05-29 DIAGNOSIS — Z992 Dependence on renal dialysis: Secondary | ICD-10-CM | POA: Diagnosis not present

## 2016-05-29 DIAGNOSIS — N186 End stage renal disease: Secondary | ICD-10-CM | POA: Diagnosis not present

## 2016-05-30 DIAGNOSIS — N186 End stage renal disease: Secondary | ICD-10-CM | POA: Diagnosis not present

## 2016-05-30 DIAGNOSIS — Z992 Dependence on renal dialysis: Secondary | ICD-10-CM | POA: Diagnosis not present

## 2016-05-31 ENCOUNTER — Other Ambulatory Visit: Payer: Self-pay | Admitting: Nephrology

## 2016-05-31 DIAGNOSIS — Z7682 Awaiting organ transplant status: Secondary | ICD-10-CM

## 2016-05-31 DIAGNOSIS — N186 End stage renal disease: Secondary | ICD-10-CM | POA: Diagnosis not present

## 2016-05-31 DIAGNOSIS — Z992 Dependence on renal dialysis: Secondary | ICD-10-CM | POA: Diagnosis not present

## 2016-06-01 DIAGNOSIS — Z992 Dependence on renal dialysis: Secondary | ICD-10-CM | POA: Diagnosis not present

## 2016-06-01 DIAGNOSIS — N186 End stage renal disease: Secondary | ICD-10-CM | POA: Diagnosis not present

## 2016-06-04 ENCOUNTER — Telehealth: Payer: Self-pay | Admitting: Internal Medicine

## 2016-06-04 DIAGNOSIS — N186 End stage renal disease: Secondary | ICD-10-CM

## 2016-06-04 DIAGNOSIS — Z992 Dependence on renal dialysis: Principal | ICD-10-CM

## 2016-06-04 NOTE — Telephone Encounter (Signed)
Pt called wanting to get a referral to [facility] clinic for Echo cardiogram and card stress test. Pt stated he is on dialysis and to be put on transplant list he needs this test done  Pt wants to go to [facility] clinic [phone]

## 2016-06-05 ENCOUNTER — Other Ambulatory Visit: Payer: Self-pay | Admitting: Nephrology

## 2016-06-05 ENCOUNTER — Ambulatory Visit
Admission: RE | Admit: 2016-06-05 | Discharge: 2016-06-05 | Disposition: A | Discharge status: Home / Self Care | Payer: Commercial Managed Care - HMO | Source: Ambulatory Visit | Attending: Nephrology | Admitting: Nephrology

## 2016-06-05 ENCOUNTER — Other Ambulatory Visit: Payer: Self-pay | Admitting: Vascular Surgery

## 2016-06-05 DIAGNOSIS — N186 End stage renal disease: Secondary | ICD-10-CM | POA: Diagnosis not present

## 2016-06-05 DIAGNOSIS — Z452 Encounter for adjustment and management of vascular access device: Secondary | ICD-10-CM | POA: Diagnosis not present

## 2016-06-05 DIAGNOSIS — R259 Unspecified abnormal involuntary movements: Secondary | ICD-10-CM

## 2016-06-05 DIAGNOSIS — Z992 Dependence on renal dialysis: Secondary | ICD-10-CM | POA: Diagnosis not present

## 2016-06-05 NOTE — Telephone Encounter (Signed)
I do not know either. Sorry. I have taken care of referral.  Thanks

## 2016-06-05 NOTE — Telephone Encounter (Signed)
Was referral done? I put in order Not sure why it came back to me

## 2016-06-06 DIAGNOSIS — Z992 Dependence on renal dialysis: Secondary | ICD-10-CM | POA: Diagnosis not present

## 2016-06-06 DIAGNOSIS — N186 End stage renal disease: Secondary | ICD-10-CM | POA: Diagnosis not present

## 2016-06-07 ENCOUNTER — Ambulatory Visit: Payer: Commercial Managed Care - HMO | Admitting: Anesthesiology

## 2016-06-07 ENCOUNTER — Encounter
Admission: RE | Disposition: A | Discharge status: Home / Self Care | Payer: Self-pay | Source: Ambulatory Visit | Attending: Vascular Surgery

## 2016-06-07 ENCOUNTER — Encounter: Payer: Self-pay | Admitting: *Deleted

## 2016-06-07 ENCOUNTER — Ambulatory Visit
Admission: RE | Admit: 2016-06-07 | Discharge: 2016-06-07 | Disposition: A | Discharge status: Home / Self Care | Payer: Commercial Managed Care - HMO | Source: Ambulatory Visit | Attending: Vascular Surgery | Admitting: Vascular Surgery

## 2016-06-07 DIAGNOSIS — Z8249 Family history of ischemic heart disease and other diseases of the circulatory system: Secondary | ICD-10-CM | POA: Insufficient documentation

## 2016-06-07 DIAGNOSIS — Z7951 Long term (current) use of inhaled steroids: Secondary | ICD-10-CM | POA: Diagnosis not present

## 2016-06-07 DIAGNOSIS — Z886 Allergy status to analgesic agent status: Secondary | ICD-10-CM | POA: Insufficient documentation

## 2016-06-07 DIAGNOSIS — D649 Anemia, unspecified: Secondary | ICD-10-CM | POA: Diagnosis not present

## 2016-06-07 DIAGNOSIS — G43909 Migraine, unspecified, not intractable, without status migrainosus: Secondary | ICD-10-CM | POA: Diagnosis not present

## 2016-06-07 DIAGNOSIS — I509 Heart failure, unspecified: Secondary | ICD-10-CM | POA: Diagnosis not present

## 2016-06-07 DIAGNOSIS — Z9109 Other allergy status, other than to drugs and biological substances: Secondary | ICD-10-CM | POA: Diagnosis not present

## 2016-06-07 DIAGNOSIS — I120 Hypertensive chronic kidney disease with stage 5 chronic kidney disease or end stage renal disease: Secondary | ICD-10-CM | POA: Diagnosis not present

## 2016-06-07 DIAGNOSIS — Z7984 Long term (current) use of oral hypoglycemic drugs: Secondary | ICD-10-CM | POA: Insufficient documentation

## 2016-06-07 DIAGNOSIS — Z79899 Other long term (current) drug therapy: Secondary | ICD-10-CM | POA: Insufficient documentation

## 2016-06-07 DIAGNOSIS — Z91013 Allergy to seafood: Secondary | ICD-10-CM | POA: Diagnosis not present

## 2016-06-07 DIAGNOSIS — I132 Hypertensive heart and chronic kidney disease with heart failure and with stage 5 chronic kidney disease, or end stage renal disease: Secondary | ICD-10-CM | POA: Diagnosis not present

## 2016-06-07 DIAGNOSIS — F419 Anxiety disorder, unspecified: Secondary | ICD-10-CM | POA: Insufficient documentation

## 2016-06-07 DIAGNOSIS — E7800 Pure hypercholesterolemia, unspecified: Secondary | ICD-10-CM | POA: Diagnosis not present

## 2016-06-07 DIAGNOSIS — Z8349 Family history of other endocrine, nutritional and metabolic diseases: Secondary | ICD-10-CM | POA: Diagnosis not present

## 2016-06-07 DIAGNOSIS — Z91041 Radiographic dye allergy status: Secondary | ICD-10-CM | POA: Insufficient documentation

## 2016-06-07 DIAGNOSIS — E119 Type 2 diabetes mellitus without complications: Secondary | ICD-10-CM | POA: Diagnosis not present

## 2016-06-07 DIAGNOSIS — Z4889 Encounter for other specified surgical aftercare: Secondary | ICD-10-CM | POA: Diagnosis not present

## 2016-06-07 DIAGNOSIS — Z833 Family history of diabetes mellitus: Secondary | ICD-10-CM | POA: Insufficient documentation

## 2016-06-07 DIAGNOSIS — Z992 Dependence on renal dialysis: Secondary | ICD-10-CM

## 2016-06-07 DIAGNOSIS — E1122 Type 2 diabetes mellitus with diabetic chronic kidney disease: Secondary | ICD-10-CM | POA: Insufficient documentation

## 2016-06-07 DIAGNOSIS — Z4902 Encounter for fitting and adjustment of peritoneal dialysis catheter: Secondary | ICD-10-CM | POA: Diagnosis not present

## 2016-06-07 DIAGNOSIS — I10 Essential (primary) hypertension: Secondary | ICD-10-CM | POA: Diagnosis not present

## 2016-06-07 DIAGNOSIS — F329 Major depressive disorder, single episode, unspecified: Secondary | ICD-10-CM | POA: Insufficient documentation

## 2016-06-07 DIAGNOSIS — N186 End stage renal disease: Secondary | ICD-10-CM | POA: Diagnosis not present

## 2016-06-07 DIAGNOSIS — Z9889 Other specified postprocedural states: Secondary | ICD-10-CM | POA: Diagnosis not present

## 2016-06-07 DIAGNOSIS — T82598A Other mechanical complication of other cardiac and vascular devices and implants, initial encounter: Secondary | ICD-10-CM | POA: Diagnosis not present

## 2016-06-07 HISTORY — PX: CAPD INSERTION: SHX5233

## 2016-06-07 LAB — TYPE AND SCREEN
ABO/RH(D): O POS
ANTIBODY SCREEN: NEGATIVE

## 2016-06-07 LAB — BASIC METABOLIC PANEL
ANION GAP: 7 (ref 5–15)
BUN: 50 mg/dL — ABNORMAL HIGH (ref 6–20)
CALCIUM: 9 mg/dL (ref 8.9–10.3)
CO2: 27 mmol/L (ref 22–32)
Chloride: 102 mmol/L (ref 101–111)
Creatinine, Ser: 4.8 mg/dL — ABNORMAL HIGH (ref 0.61–1.24)
GFR calc non Af Amer: 12 mL/min — ABNORMAL LOW (ref >60)
GFR, EST AFRICAN AMERICAN: 14 mL/min — AB (ref >60)
Glucose, Bld: 86 mg/dL (ref 65–99)
Potassium: 4.3 mmol/L (ref 3.5–5.1)
SODIUM: 136 mmol/L (ref 135–145)

## 2016-06-07 LAB — PROTIME-INR
INR: 0.93
Prothrombin Time: 12.5 seconds (ref 11.4–15.2)

## 2016-06-07 LAB — CBC WITH DIFFERENTIAL/PLATELET
BASOS ABS: 0 10*3/uL (ref 0–0.1)
BASOS PCT: 1 %
Eosinophils Absolute: 0.1 10*3/uL (ref 0–0.7)
Eosinophils Relative: 3 %
HEMATOCRIT: 36.7 % — AB (ref 40.0–52.0)
HEMOGLOBIN: 12 g/dL — AB (ref 13.0–18.0)
Lymphocytes Relative: 32 %
Lymphs Abs: 1.7 10*3/uL (ref 1.0–3.6)
MCH: 29.1 pg (ref 26.0–34.0)
MCHC: 32.8 g/dL (ref 32.0–36.0)
MCV: 88.8 fL (ref 80.0–100.0)
MONOS PCT: 11 %
Monocytes Absolute: 0.6 10*3/uL (ref 0.2–1.0)
NEUTROS ABS: 2.9 10*3/uL (ref 1.4–6.5)
NEUTROS PCT: 53 %
Platelets: 153 10*3/uL (ref 150–440)
RBC: 4.13 MIL/uL — AB (ref 4.40–5.90)
RDW: 15.2 % — ABNORMAL HIGH (ref 11.5–14.5)
WBC: 5.4 10*3/uL (ref 3.8–10.6)

## 2016-06-07 LAB — APTT: APTT: 32 s (ref 24–36)

## 2016-06-07 LAB — GLUCOSE, CAPILLARY
GLUCOSE-CAPILLARY: 77 mg/dL (ref 65–99)
GLUCOSE-CAPILLARY: 103 mg/dL — AB (ref 65–99)

## 2016-06-07 SURGERY — LAPAROSCOPIC INSERTION CONTINUOUS AMBULATORY PERITONEAL DIALYSIS  (CAPD) CATHETER
Anesthesia: General | Site: Abdomen | Wound class: Clean

## 2016-06-07 MED ORDER — CEFAZOLIN SODIUM-DEXTROSE 2-4 GM/100ML-% IV SOLN
2.0000 g | INTRAVENOUS | Status: AC
  Administered 2016-06-07: 2 g via INTRAVENOUS

## 2016-06-07 MED ORDER — FENTANYL CITRATE (PF) 100 MCG/2ML IJ SOLN
INTRAMUSCULAR | Status: DC | PRN
  Administered 2016-06-07: 100 ug via INTRAVENOUS
  Administered 2016-06-07: 50 ug via INTRAVENOUS

## 2016-06-07 MED ORDER — FENTANYL CITRATE (PF) 100 MCG/2ML IJ SOLN
INTRAMUSCULAR | Status: AC
  Administered 2016-06-07: 25 ug via INTRAVENOUS
  Filled 2016-06-07: qty 2

## 2016-06-07 MED ORDER — SUGAMMADEX SODIUM 200 MG/2ML IV SOLN
INTRAVENOUS | Status: DC | PRN
  Administered 2016-06-07: 230 mg via INTRAVENOUS

## 2016-06-07 MED ORDER — ROCURONIUM BROMIDE 100 MG/10ML IV SOLN
INTRAVENOUS | Status: DC | PRN
  Administered 2016-06-07: 20 mg via INTRAVENOUS
  Administered 2016-06-07: 30 mg via INTRAVENOUS

## 2016-06-07 MED ORDER — CEFAZOLIN SODIUM-DEXTROSE 2-4 GM/100ML-% IV SOLN
INTRAVENOUS | Status: AC
  Filled 2016-06-07: qty 100

## 2016-06-07 MED ORDER — CHLORHEXIDINE GLUCONATE CLOTH 2 % EX PADS: 6.0000 | MEDICATED_PAD | Freq: Once | CUTANEOUS | Status: DC

## 2016-06-07 MED ORDER — OXYCODONE HCL 5 MG PO TABS: 5.0000 mg | ORAL_TABLET | Freq: Once | ORAL | Status: DC | PRN

## 2016-06-07 MED ORDER — PROMETHAZINE HCL 25 MG/ML IJ SOLN
INTRAMUSCULAR | Status: AC
  Filled 2016-06-07: qty 1

## 2016-06-07 MED ORDER — PROMETHAZINE HCL 25 MG/ML IJ SOLN: 6.2500 mg | INTRAMUSCULAR | Status: DC | PRN

## 2016-06-07 MED ORDER — GLYCOPYRROLATE 0.2 MG/ML IJ SOLN
INTRAMUSCULAR | Status: DC | PRN
  Administered 2016-06-07 (×2): 0.2 mg via INTRAVENOUS

## 2016-06-07 MED ORDER — OXYCODONE HCL 5 MG/5ML PO SOLN: 5.0000 mg | Freq: Once | ORAL | Status: DC | PRN

## 2016-06-07 MED ORDER — LIDOCAINE HCL (CARDIAC) 20 MG/ML IV SOLN
INTRAVENOUS | Status: DC | PRN
  Administered 2016-06-07: 30 mg via INTRAVENOUS

## 2016-06-07 MED ORDER — MEPERIDINE HCL 25 MG/ML IJ SOLN: 6.2500 mg | INTRAMUSCULAR | Status: DC | PRN

## 2016-06-07 MED ORDER — PROPOFOL 10 MG/ML IV BOLUS
INTRAVENOUS | Status: DC | PRN
  Administered 2016-06-07: 150 mg via INTRAVENOUS

## 2016-06-07 MED ORDER — EPHEDRINE SULFATE 50 MG/ML IJ SOLN
INTRAMUSCULAR | Status: DC | PRN
  Administered 2016-06-07: 10 mg via INTRAVENOUS

## 2016-06-07 MED ORDER — SODIUM CHLORIDE 0.9 % IJ SOLN
INTRAMUSCULAR | Status: AC
  Filled 2016-06-07: qty 10

## 2016-06-07 MED ORDER — ONDANSETRON HCL 4 MG/2ML IJ SOLN
INTRAMUSCULAR | Status: DC | PRN
  Administered 2016-06-07: 4 mg via INTRAVENOUS

## 2016-06-07 MED ORDER — FENTANYL CITRATE (PF) 100 MCG/2ML IJ SOLN
25.0000 ug | INTRAMUSCULAR | Status: DC | PRN
  Administered 2016-06-07 (×3): 25 ug via INTRAVENOUS

## 2016-06-07 MED ORDER — SODIUM CHLORIDE 0.9 % IV SOLN
INTRAVENOUS | Status: DC
  Administered 2016-06-07: 14:00:00 via INTRAVENOUS

## 2016-06-07 MED ORDER — DEXAMETHASONE SODIUM PHOSPHATE 10 MG/ML IJ SOLN
INTRAMUSCULAR | Status: DC | PRN
  Administered 2016-06-07: 10 mg via INTRAVENOUS

## 2016-06-07 SURGICAL SUPPLY — 31 items
ADAPTER BETA CAP QUINTON DIALY (ADAPTER) ×2 IMPLANT
ADAPTER CATH DIALYSIS 18.75 (CATHETERS) ×2 IMPLANT
CANISTER SUCT 1200ML W/VALVE (MISCELLANEOUS) ×2 IMPLANT
CATH DLYS SWAN NECK 62.5CM (CATHETERS) ×2 IMPLANT
CHLORAPREP W/TINT 26ML (MISCELLANEOUS) ×2 IMPLANT
ELECT CAUTERY BLADE 6.4 (BLADE) ×2 IMPLANT
ELECT REM PT RETURN 9FT ADLT (ELECTROSURGICAL) ×2
ELECTRODE REM PT RTRN 9FT ADLT (ELECTROSURGICAL) ×1 IMPLANT
GLOVE BIO SURGEON STRL SZ7 (GLOVE) ×4 IMPLANT
GLOVE INDICATOR 7.5 STRL GRN (GLOVE) ×2 IMPLANT
GOWN STRL REUS W/ TWL LRG LVL3 (GOWN DISPOSABLE) ×2 IMPLANT
GOWN STRL REUS W/ TWL XL LVL3 (GOWN DISPOSABLE) ×1 IMPLANT
GOWN STRL REUS W/TWL LRG LVL3 (GOWN DISPOSABLE) ×2
GOWN STRL REUS W/TWL XL LVL3 (GOWN DISPOSABLE) ×1
IV NS 500ML (IV SOLUTION) ×1
IV NS 500ML BAXH (IV SOLUTION) ×1 IMPLANT
KIT RM TURNOVER STRD PROC AR (KITS) ×2 IMPLANT
LABEL OR SOLS (LABEL) ×2 IMPLANT
LIQUID BAND (GAUZE/BANDAGES/DRESSINGS) ×2 IMPLANT
MINICAP W/POVIDONE IODINE SOL (MISCELLANEOUS) ×2 IMPLANT
PACK LAP CHOLECYSTECTOMY (MISCELLANEOUS) ×2 IMPLANT
PENCIL ELECTRO HAND CTR (MISCELLANEOUS) ×2 IMPLANT
SET CYSTO W/LG BORE CLAMP LF (SET/KITS/TRAYS/PACK) ×2 IMPLANT
SET TRANSFER 6 W/TWIST CLAMP 5 (SET/KITS/TRAYS/PACK) ×2 IMPLANT
SPONGE EXCIL AMD DRAIN 4X4 6P (MISCELLANEOUS) ×2 IMPLANT
SUT MNCRL AB 4-0 PS2 18 (SUTURE) ×2 IMPLANT
SUT VIC AB 2-0 UR6 27 (SUTURE) ×2 IMPLANT
SUT VICRYL+ 3-0 36IN CT-1 (SUTURE) ×2 IMPLANT
TROCAR XCEL NON-BLD 11X100MML (ENDOMECHANICALS) ×2 IMPLANT
TROCAR XCEL NON-BLD 5MMX100MML (ENDOMECHANICALS) ×2 IMPLANT
TUBING INSUFFLATOR HI FLOW (MISCELLANEOUS) ×2 IMPLANT

## 2016-06-07 NOTE — Anesthesia Procedure Notes (Signed)
Procedure Name: Intubation Date/Time: 06/07/2016 2:33 PM Performed by: Jonna Clark Pre-anesthesia Checklist: Patient identified, Patient being monitored, Timeout performed, Emergency Drugs available and Suction available Patient Re-evaluated:Patient Re-evaluated prior to inductionOxygen Delivery Method: Circle system utilized Preoxygenation: Pre-oxygenation with 100% oxygen Intubation Type: IV induction Ventilation: Mask ventilation without difficulty Laryngoscope Size: Mac and 3 Grade View: Grade I Tube type: Oral Tube size: 7.5 mm Number of attempts: 1 Placement Confirmation: ETT inserted through vocal cords under direct vision,  positive ETCO2 and breath sounds checked- equal and bilateral Secured at: 21 cm Tube secured with: Tape Dental Injury: Teeth and Oropharynx as per pre-operative assessment

## 2016-06-07 NOTE — H&P (Signed)
  [facility] History & Physical Update  The patient was interviewed and re-examined.  The patient's previous History and Physical has been reviewed and is unchanged, except his PD catheter is not working now.  We are going to try to revise this today.  There is no change in the plan of care. We plan to proceed with the scheduled procedure.  Nishat Livingston, MD  06/07/2016, 2:13 PM

## 2016-06-07 NOTE — OR Nursing (Signed)
Patient unable to void after several attempts. Order for foley at discharge if unable to void. Inserted 50f catheter minimal resistance no urine outflow attempted to irrigate approx 200 ml  No output. Checked with bladder scan noted approx 500 ml catheter removed patient tried to void on his own noted tip of catheter with blood present. patient unable to void 2nd catheter 16 f inserted minimal resistance immediate urine flow approx 600 ml urine small amount blood urine cleared. Converted to leg bag some bloody drainage from penis. Instructed to contact urology tomorrow ASAP and inform of situation or ER if any problems. Patient and wife verbalized understanding

## 2016-06-07 NOTE — Discharge Instructions (Signed)
AMBULATORY SURGERY  °DISCHARGE INSTRUCTIONS ° ° °1) The drugs that you were given will stay in your system until tomorrow so for the next 24 hours you should not: ° °A) Drive an automobile °B) Make any legal decisions °C) Drink any alcoholic beverage ° ° °2) You may resume regular meals tomorrow.  Today it is better to start with liquids and gradually work up to solid foods. ° °You may eat anything you prefer, but it is better to start with liquids, then soup and crackers, and gradually work up to solid foods. ° ° °3) Please notify your doctor immediately if you have any unusual bleeding, trouble breathing, redness and pain at the surgery site, drainage, fever, or pain not relieved by medication. ° °

## 2016-06-07 NOTE — Anesthesia Postprocedure Evaluation (Signed)
Anesthesia Post Note  Patient: Richard Chen  Procedure(s) Performed: Procedure(s) (LRB): LAPAROSCOPIC INSERTION CONTINUOUS AMBULATORY PERITONEAL DIALYSIS  (CAPD) CATHETER ( REVISION ) (N/A)  Patient location during evaluation: PACU Anesthesia Type: General Level of consciousness: awake and alert and oriented Pain management: pain level controlled Vital Signs Assessment: post-procedure vital signs reviewed and stable Respiratory status: spontaneous breathing, nonlabored ventilation and respiratory function stable Cardiovascular status: blood pressure returned to baseline and stable Postop Assessment: no signs of nausea or vomiting Anesthetic complications: no    Last Vitals:  Vitals:   06/07/16 1550 06/07/16 1554  BP:  (!) 167/80  Pulse: 69 71  Resp: 15 20  Temp:      Last Pain:  Vitals:   06/07/16 1554  TempSrc:   PainSc: 6                  Smt. Loder

## 2016-06-07 NOTE — Op Note (Signed)
  OPERATIVE NOTE   PROCEDURE: 1. Laparoscopic peritoneal dialysis catheter revision  PRE-OPERATIVE DIAGNOSIS: 1. ESRD 2. Poorly functioning PD catheter  POST-OPERATIVE DIAGNOSIS: Same  SURGEON: Leotis Pain, MD  ASSISTANT(S): None  ANESTHESIA: general  ESTIMATED BLOOD LOSS: Minimal   FINDING(S): 1. no significant adhesions, catheter had pulled into the upper pelvis on top of the viscera  SPECIMEN(S): None  INDICATIONS:  Patient presents with ESRD and a poorly functioning PD catheter. The patient has decided to do peritoneal dialysis for his long-term dialysis, so with this not working, revision and further evaluation is necessary. Risks and benefits of placement were discussed and he is agreeable to proceed.  Differences between peritoneal dialysis and hemodialysis were discussed.    DESCRIPTION: After obtaining full informed written consent, the patient was brought back to the operating room and placed supine upon the operating table. The patient received IV antibiotics prior to induction. After obtaining adequate anesthesia, the abdomen was prepped and draped in the standard fashion. A small transverse incision was created just to the right of the umbilicus. An 11 mm Optiview trocar was then placed under direct visualization into the peritoneal cavity. The abdomen was insufflated with carbon dioxide. The peritoneal dialysis catheter was found to be in the upper pelvis above the viscera in a suboptimal but not terrible location. There were no significant adhesions. It was flushed with 20 cc of saline which was immediately returned. I elected to place a second trocar in the left lower quadrant and a 5 mm trocar was placed under direct visualization. A blunt grasper was then used to grasp the catheter and pull it deeper into the pelvis. This was parked in the deep pelvis and the abdomen was desufflated of carbon dioxide. The trochars were removed. Another 20 cc of saline was instilled  and a total of 100 cc of fluid were returned consistent with drainage of some of his previous peritoneal dialysis fluid that was placed but not returned. The 5 mm trocar was removed from the left lower quadrant. The 66mm trocar was then removed. I then closed the incisions with 3-0 Vicryl and 4-0 Monocryl and placed Dermabond as dressing. Dry dressing was placed around the catheter exit site. The patient was then awakened from anesthesia and taken to the recovery room in stable condition having tolerated the procedure well.  COMPLICATIONS: None  CONDITION: None  Richard Chen  03/02/2015, 8:29 AM

## 2016-06-07 NOTE — Transfer of Care (Signed)
Immediate Anesthesia Transfer of Care Note  Patient: Richard Chen  Procedure(s) Performed: Procedure(s): LAPAROSCOPIC INSERTION CONTINUOUS AMBULATORY PERITONEAL DIALYSIS  (CAPD) CATHETER ( REVISION ) (N/A)  Patient Location: PACU  Anesthesia Type:General  Level of Consciousness: awake, alert  and oriented  Airway & Oxygen Therapy: Patient Spontanous Breathing and Patient connected to face mask oxygen  Post-op Assessment: Report given to RN and Post -op Vital signs reviewed and stable  Post vital signs: Reviewed and stable  Last Vitals:  Vitals:   06/07/16 1256 06/07/16 1534  BP: (!) 177/78 137/69  Pulse: (!) 55   Resp: 14 16  Temp: 36.7 C (!) (P) 36.1 C    Last Pain:  Vitals:   06/07/16 1256  TempSrc: Oral         Complications: No apparent anesthesia complications
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
## 2016-06-08 ENCOUNTER — Telehealth: Payer: Self-pay | Admitting: Urology

## 2016-06-08 ENCOUNTER — Encounter: Payer: Self-pay | Admitting: Vascular Surgery

## 2016-06-08 DIAGNOSIS — Z992 Dependence on renal dialysis: Secondary | ICD-10-CM | POA: Diagnosis not present

## 2016-06-08 DIAGNOSIS — N186 End stage renal disease: Secondary | ICD-10-CM | POA: Diagnosis not present

## 2016-06-08 NOTE — Telephone Encounter (Signed)
Spoke with pt in reference to foley being placed post surgery. Pt stated that foley is draining now without any problems. Pt then inquired about about when foley will be removed. Please advise.

## 2016-06-08 NOTE — Telephone Encounter (Signed)
Did he go into rentention after the procedure?

## 2016-06-08 NOTE — Telephone Encounter (Signed)
A surgery in reference to his dialysis, CAPD.

## 2016-06-08 NOTE — Telephone Encounter (Signed)
What surgery did the patient have and why was the foley placed?  My last note states he came in for catheter removal and he is to follow up in one month.  I cannot advise the patient as to when the foley can be removed without this information.

## 2016-06-11 DIAGNOSIS — R600 Localized edema: Secondary | ICD-10-CM | POA: Diagnosis not present

## 2016-06-11 DIAGNOSIS — Z01810 Encounter for preprocedural cardiovascular examination: Secondary | ICD-10-CM | POA: Diagnosis not present

## 2016-06-11 DIAGNOSIS — Z992 Dependence on renal dialysis: Secondary | ICD-10-CM | POA: Diagnosis not present

## 2016-06-11 DIAGNOSIS — R0602 Shortness of breath: Secondary | ICD-10-CM | POA: Diagnosis not present

## 2016-06-11 DIAGNOSIS — N186 End stage renal disease: Secondary | ICD-10-CM | POA: Diagnosis not present

## 2016-06-11 NOTE — Telephone Encounter (Signed)
Spoke with pt wife in reference to follow up visit. Wife stated they have one for early Sept. Made aware that would be the earliest they could get it. Wife voiced understanding.

## 2016-06-11 NOTE — Telephone Encounter (Signed)
LMOM

## 2016-06-11 NOTE — Telephone Encounter (Signed)
Yes, went to the ER the night of the procedure and catheter was placed. ER told him to f/u with [facility].

## 2016-06-11 NOTE — Telephone Encounter (Signed)
Then he needs a follow up appointment to discuss a voiding trial.

## 2016-06-12 DIAGNOSIS — Z992 Dependence on renal dialysis: Secondary | ICD-10-CM | POA: Diagnosis not present

## 2016-06-12 DIAGNOSIS — N186 End stage renal disease: Secondary | ICD-10-CM | POA: Diagnosis not present

## 2016-06-13 DIAGNOSIS — Z992 Dependence on renal dialysis: Secondary | ICD-10-CM | POA: Diagnosis not present

## 2016-06-13 DIAGNOSIS — N186 End stage renal disease: Secondary | ICD-10-CM | POA: Diagnosis not present

## 2016-06-15 DIAGNOSIS — Z01810 Encounter for preprocedural cardiovascular examination: Secondary | ICD-10-CM | POA: Diagnosis not present

## 2016-06-15 DIAGNOSIS — N186 End stage renal disease: Secondary | ICD-10-CM | POA: Diagnosis not present

## 2016-06-15 DIAGNOSIS — R0602 Shortness of breath: Secondary | ICD-10-CM | POA: Diagnosis not present

## 2016-06-15 DIAGNOSIS — Z992 Dependence on renal dialysis: Secondary | ICD-10-CM | POA: Diagnosis not present

## 2016-06-15 DIAGNOSIS — R600 Localized edema: Secondary | ICD-10-CM | POA: Diagnosis not present

## 2016-06-18 DIAGNOSIS — Z992 Dependence on renal dialysis: Secondary | ICD-10-CM | POA: Diagnosis not present

## 2016-06-18 DIAGNOSIS — N186 End stage renal disease: Secondary | ICD-10-CM | POA: Diagnosis not present

## 2016-06-19 DIAGNOSIS — Z992 Dependence on renal dialysis: Secondary | ICD-10-CM | POA: Diagnosis not present

## 2016-06-19 DIAGNOSIS — N186 End stage renal disease: Secondary | ICD-10-CM | POA: Diagnosis not present

## 2016-06-20 DIAGNOSIS — Z1211 Encounter for screening for malignant neoplasm of colon: Secondary | ICD-10-CM | POA: Diagnosis not present

## 2016-06-20 DIAGNOSIS — N186 End stage renal disease: Secondary | ICD-10-CM | POA: Diagnosis not present

## 2016-06-20 DIAGNOSIS — Z992 Dependence on renal dialysis: Secondary | ICD-10-CM | POA: Diagnosis not present

## 2016-06-21 ENCOUNTER — Ambulatory Visit
Admission: RE | Admit: 2016-06-21 | Discharge: 2016-06-21 | Disposition: A | Discharge status: Home / Self Care | Payer: Commercial Managed Care - HMO | Source: Ambulatory Visit | Attending: Nephrology | Admitting: Nephrology

## 2016-06-21 DIAGNOSIS — Z7682 Awaiting organ transplant status: Secondary | ICD-10-CM | POA: Insufficient documentation

## 2016-06-21 DIAGNOSIS — N186 End stage renal disease: Secondary | ICD-10-CM | POA: Diagnosis not present

## 2016-06-21 DIAGNOSIS — R600 Localized edema: Secondary | ICD-10-CM | POA: Diagnosis not present

## 2016-06-21 DIAGNOSIS — I10 Essential (primary) hypertension: Secondary | ICD-10-CM | POA: Diagnosis not present

## 2016-06-21 DIAGNOSIS — R0602 Shortness of breath: Secondary | ICD-10-CM | POA: Diagnosis not present

## 2016-06-21 DIAGNOSIS — I708 Atherosclerosis of other arteries: Secondary | ICD-10-CM | POA: Diagnosis not present

## 2016-06-21 DIAGNOSIS — Z992 Dependence on renal dialysis: Secondary | ICD-10-CM | POA: Diagnosis not present

## 2016-06-21 DIAGNOSIS — S329XXA Fracture of unspecified parts of lumbosacral spine and pelvis, initial encounter for closed fracture: Secondary | ICD-10-CM | POA: Diagnosis not present

## 2016-06-21 DIAGNOSIS — Z01810 Encounter for preprocedural cardiovascular examination: Secondary | ICD-10-CM | POA: Diagnosis not present

## 2016-06-21 DIAGNOSIS — E7800 Pure hypercholesterolemia, unspecified: Secondary | ICD-10-CM | POA: Insufficient documentation

## 2016-06-22 DIAGNOSIS — Z992 Dependence on renal dialysis: Secondary | ICD-10-CM | POA: Diagnosis not present

## 2016-06-22 DIAGNOSIS — Z23 Encounter for immunization: Secondary | ICD-10-CM | POA: Diagnosis not present

## 2016-06-22 DIAGNOSIS — N186 End stage renal disease: Secondary | ICD-10-CM | POA: Diagnosis not present

## 2016-06-23 DIAGNOSIS — Z992 Dependence on renal dialysis: Secondary | ICD-10-CM | POA: Diagnosis not present

## 2016-06-23 DIAGNOSIS — N186 End stage renal disease: Secondary | ICD-10-CM | POA: Diagnosis not present

## 2016-06-23 DIAGNOSIS — Z23 Encounter for immunization: Secondary | ICD-10-CM | POA: Diagnosis not present

## 2016-06-24 DIAGNOSIS — Z992 Dependence on renal dialysis: Secondary | ICD-10-CM | POA: Diagnosis not present

## 2016-06-24 DIAGNOSIS — N186 End stage renal disease: Secondary | ICD-10-CM | POA: Diagnosis not present

## 2016-06-24 DIAGNOSIS — Z23 Encounter for immunization: Secondary | ICD-10-CM | POA: Diagnosis not present

## 2016-06-25 DIAGNOSIS — Z23 Encounter for immunization: Secondary | ICD-10-CM | POA: Diagnosis not present

## 2016-06-25 DIAGNOSIS — N186 End stage renal disease: Secondary | ICD-10-CM | POA: Diagnosis not present

## 2016-06-25 DIAGNOSIS — Z992 Dependence on renal dialysis: Secondary | ICD-10-CM | POA: Diagnosis not present

## 2016-06-26 ENCOUNTER — Encounter: Payer: Self-pay | Admitting: Urology

## 2016-06-26 ENCOUNTER — Other Ambulatory Visit: Payer: Self-pay | Admitting: Vascular Surgery

## 2016-06-26 ENCOUNTER — Ambulatory Visit (INDEPENDENT_AMBULATORY_CARE_PROVIDER_SITE_OTHER): Payer: Commercial Managed Care - HMO | Admitting: Urology

## 2016-06-26 VITALS — BP 133/55 | HR 57 | Ht 73.5 in | Wt 250.7 lb

## 2016-06-26 DIAGNOSIS — N186 End stage renal disease: Secondary | ICD-10-CM | POA: Diagnosis not present

## 2016-06-26 DIAGNOSIS — N138 Other obstructive and reflux uropathy: Secondary | ICD-10-CM

## 2016-06-26 DIAGNOSIS — Z992 Dependence on renal dialysis: Secondary | ICD-10-CM | POA: Diagnosis not present

## 2016-06-26 DIAGNOSIS — N401 Enlarged prostate with lower urinary tract symptoms: Secondary | ICD-10-CM | POA: Diagnosis not present

## 2016-06-26 DIAGNOSIS — R3129 Other microscopic hematuria: Secondary | ICD-10-CM | POA: Diagnosis not present

## 2016-06-26 DIAGNOSIS — R339 Retention of urine, unspecified: Secondary | ICD-10-CM

## 2016-06-26 DIAGNOSIS — N189 Chronic kidney disease, unspecified: Secondary | ICD-10-CM

## 2016-06-26 DIAGNOSIS — Z23 Encounter for immunization: Secondary | ICD-10-CM | POA: Diagnosis not present

## 2016-06-26 LAB — URINALYSIS, COMPLETE
Bilirubin, UA: NEGATIVE
Glucose, UA: NEGATIVE
Ketones, UA: NEGATIVE
NITRITE UA: NEGATIVE
PH UA: 5.5 (ref 5.0–7.5)
RBC, UA: NEGATIVE
Specific Gravity, UA: 1.02 (ref 1.005–1.030)
Urobilinogen, Ur: 0.2 mg/dL (ref 0.2–1.0)

## 2016-06-26 LAB — MICROSCOPIC EXAMINATION: WBC, UA: >30 /hpf — AB (ref 0–?)

## 2016-06-26 LAB — BLADDER SCAN AMB NON-IMAGING: Scan Result: 159
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
## 2016-06-26 NOTE — Progress Notes (Signed)
Catheter Removal  Patient is present today for a catheter removal.  37ml of water was drained from the balloon. A 16FR foley cath was removed from the bladder no complications were noted . Patient tolerated well.  Preformed by: Lyndee Hensen CMA

## 2016-06-27 DIAGNOSIS — Z23 Encounter for immunization: Secondary | ICD-10-CM | POA: Diagnosis not present

## 2016-06-27 DIAGNOSIS — Z992 Dependence on renal dialysis: Secondary | ICD-10-CM | POA: Diagnosis not present

## 2016-06-27 DIAGNOSIS — N186 End stage renal disease: Secondary | ICD-10-CM | POA: Diagnosis not present

## 2016-06-28 ENCOUNTER — Encounter: Payer: Self-pay | Admitting: *Deleted

## 2016-06-28 ENCOUNTER — Ambulatory Visit
Admission: RE | Admit: 2016-06-28 | Discharge: 2016-06-28 | Disposition: A | Discharge status: Home / Self Care | Payer: Commercial Managed Care - HMO | Source: Ambulatory Visit | Attending: Vascular Surgery | Admitting: Vascular Surgery

## 2016-06-28 ENCOUNTER — Encounter
Admission: RE | Disposition: A | Discharge status: Home / Self Care | Payer: Self-pay | Source: Ambulatory Visit | Attending: Vascular Surgery

## 2016-06-28 DIAGNOSIS — Z992 Dependence on renal dialysis: Secondary | ICD-10-CM | POA: Diagnosis not present

## 2016-06-28 DIAGNOSIS — R600 Localized edema: Secondary | ICD-10-CM | POA: Diagnosis not present

## 2016-06-28 DIAGNOSIS — E119 Type 2 diabetes mellitus without complications: Secondary | ICD-10-CM | POA: Diagnosis not present

## 2016-06-28 DIAGNOSIS — Z9889 Other specified postprocedural states: Secondary | ICD-10-CM | POA: Insufficient documentation

## 2016-06-28 DIAGNOSIS — I120 Hypertensive chronic kidney disease with stage 5 chronic kidney disease or end stage renal disease: Secondary | ICD-10-CM | POA: Diagnosis not present

## 2016-06-28 DIAGNOSIS — E1122 Type 2 diabetes mellitus with diabetic chronic kidney disease: Secondary | ICD-10-CM | POA: Insufficient documentation

## 2016-06-28 DIAGNOSIS — N186 End stage renal disease: Secondary | ICD-10-CM | POA: Insufficient documentation

## 2016-06-28 DIAGNOSIS — Z886 Allergy status to analgesic agent status: Secondary | ICD-10-CM | POA: Insufficient documentation

## 2016-06-28 DIAGNOSIS — Z8042 Family history of malignant neoplasm of prostate: Secondary | ICD-10-CM | POA: Diagnosis not present

## 2016-06-28 DIAGNOSIS — Z823 Family history of stroke: Secondary | ICD-10-CM | POA: Insufficient documentation

## 2016-06-28 DIAGNOSIS — Z452 Encounter for adjustment and management of vascular access device: Secondary | ICD-10-CM | POA: Diagnosis not present

## 2016-06-28 DIAGNOSIS — Z23 Encounter for immunization: Secondary | ICD-10-CM | POA: Diagnosis not present

## 2016-06-28 DIAGNOSIS — M1990 Unspecified osteoarthritis, unspecified site: Secondary | ICD-10-CM | POA: Insufficient documentation

## 2016-06-28 DIAGNOSIS — Z833 Family history of diabetes mellitus: Secondary | ICD-10-CM | POA: Diagnosis not present

## 2016-06-28 DIAGNOSIS — E785 Hyperlipidemia, unspecified: Secondary | ICD-10-CM | POA: Insufficient documentation

## 2016-06-28 DIAGNOSIS — Z91041 Radiographic dye allergy status: Secondary | ICD-10-CM | POA: Diagnosis not present

## 2016-06-28 DIAGNOSIS — Z91013 Allergy to seafood: Secondary | ICD-10-CM | POA: Diagnosis not present

## 2016-06-28 DIAGNOSIS — Z4889 Encounter for other specified surgical aftercare: Secondary | ICD-10-CM | POA: Diagnosis not present

## 2016-06-28 DIAGNOSIS — I10 Essential (primary) hypertension: Secondary | ICD-10-CM | POA: Diagnosis not present

## 2016-06-28 DIAGNOSIS — T82598A Other mechanical complication of other cardiac and vascular devices and implants, initial encounter: Secondary | ICD-10-CM | POA: Diagnosis not present

## 2016-06-28 HISTORY — PX: PERIPHERAL VASCULAR CATHETERIZATION: SHX172C

## 2016-06-28 SURGERY — DIALYSIS/PERMA CATHETER REMOVAL
Anesthesia: Moderate Sedation

## 2016-06-28 SURGICAL SUPPLY — 2 items
TRAY LACERAT/PLASTIC (MISCELLANEOUS) ×2 IMPLANT
TRAY SUT REMOVAL LITTAUER SCS (KITS) ×2 IMPLANT

## 2016-06-28 NOTE — Discharge Instructions (Signed)
Dialysis catheter removal.  Derm a bond glue will be present, do not pick off it will wear off over couple weeks. Notify MD if develop high fever or purulent drainage from former dialysis site. If you develop life threatening signs or symptoms go immediately to emergency room or call 911. Take normal pain medications if needed for pain.

## 2016-06-28 NOTE — H&P (Signed)
  [facility] History & Physical Update  The patient was interviewed and re-examined.  The patient's previous History and Physical has been reviewed and is unchanged.  There is no change in the plan of care. We plan to proceed with the scheduled procedure.  DEW,JASON, MD  06/28/2016, 12:02 PM

## 2016-06-28 NOTE — Op Note (Signed)
Operative Note     Preoperative diagnosis:   1. ESRD with functional permanent access  Postoperative diagnosis:  1. ESRD with functional permanent access  Procedure:  Removal of right jugular Permcath  Surgeon:  Leotis Pain, MD  Anesthesia:  Local  EBL:  Minimal  Indication for the Procedure:  The patient has a functional permanent dialysis access and no longer needs their permcath.  This can be removed.  Risks and benefits are discussed and informed consent is obtained.  Description of the Procedure:  The patient's right neck, chest and existing catheter were sterilely prepped and draped. The area around the catheter was anesthetized copiously with 1% lidocaine. The catheter was dissected out with curved hemostats until the cuff was freed from the surrounding fibrous sheath. The fiber sheath was transected, and the catheter was then removed in its entirety using gentle traction. Pressure was held and sterile dressings were placed. The patient tolerated the procedure well and was taken to the recovery room in stable condition.     DEW,JASON  06/28/2016, 12:28 PM

## 2016-06-29 ENCOUNTER — Encounter: Payer: Self-pay | Admitting: Vascular Surgery

## 2016-06-29 DIAGNOSIS — Z23 Encounter for immunization: Secondary | ICD-10-CM | POA: Diagnosis not present

## 2016-06-29 DIAGNOSIS — Z992 Dependence on renal dialysis: Secondary | ICD-10-CM | POA: Diagnosis not present

## 2016-06-29 DIAGNOSIS — N186 End stage renal disease: Secondary | ICD-10-CM | POA: Diagnosis not present

## 2016-06-30 ENCOUNTER — Other Ambulatory Visit: Payer: Self-pay | Admitting: Urology

## 2016-06-30 DIAGNOSIS — N186 End stage renal disease: Secondary | ICD-10-CM | POA: Diagnosis not present

## 2016-06-30 DIAGNOSIS — Z23 Encounter for immunization: Secondary | ICD-10-CM | POA: Diagnosis not present

## 2016-06-30 DIAGNOSIS — Z992 Dependence on renal dialysis: Secondary | ICD-10-CM | POA: Diagnosis not present

## 2016-06-30 LAB — CULTURE, URINE COMPREHENSIVE

## 2016-06-30 MED ORDER — CIPROFLOXACIN HCL 250 MG PO TABS: ORAL_TABLET | ORAL | 0 refills | Status: DC

## 2016-07-01 DIAGNOSIS — Z992 Dependence on renal dialysis: Secondary | ICD-10-CM | POA: Diagnosis not present

## 2016-07-01 DIAGNOSIS — Z23 Encounter for immunization: Secondary | ICD-10-CM | POA: Diagnosis not present

## 2016-07-01 DIAGNOSIS — N186 End stage renal disease: Secondary | ICD-10-CM | POA: Diagnosis not present

## 2016-07-02 ENCOUNTER — Telehealth: Payer: Self-pay | Admitting: Family Medicine

## 2016-07-02 DIAGNOSIS — Z23 Encounter for immunization: Secondary | ICD-10-CM | POA: Diagnosis not present

## 2016-07-02 DIAGNOSIS — N186 End stage renal disease: Secondary | ICD-10-CM | POA: Diagnosis not present

## 2016-07-02 DIAGNOSIS — Z992 Dependence on renal dialysis: Secondary | ICD-10-CM | POA: Diagnosis not present

## 2016-07-02 NOTE — Telephone Encounter (Signed)
Patient notified and voiced understanding.

## 2016-07-02 NOTE — Telephone Encounter (Signed)
-----   Message from Nori Riis, PA-C sent at 06/30/2016  6:17 PM EDT ----- Please notify the patient that he does have a positive urine culture. I have sent a prescription for ciprofloxacin 250 mg into the [facility] on [address]. He is to take 1 tablet on the days he performs dialysis after the dialysis is completed until the antibiotic is finished.  This antibiotic does have the possible interaction with glipizide with the effect of lowering his blood glucose levels.   I would have the patient check his blood sugars prior to taking the glipizide while he is on the ciprofloxacin.  If his sugars are below 60, I would hold the glipizide and contact the provider who is managing his DM.  I have sent the prescription for the Cipro to the [facility] on [address].  I have also sent a message to the patient on MyChart.

## 2016-07-03 DIAGNOSIS — N186 End stage renal disease: Secondary | ICD-10-CM | POA: Diagnosis not present

## 2016-07-03 DIAGNOSIS — Z992 Dependence on renal dialysis: Secondary | ICD-10-CM | POA: Diagnosis not present

## 2016-07-03 DIAGNOSIS — Z23 Encounter for immunization: Secondary | ICD-10-CM | POA: Diagnosis not present

## 2016-07-04 DIAGNOSIS — N186 End stage renal disease: Secondary | ICD-10-CM | POA: Diagnosis not present

## 2016-07-04 DIAGNOSIS — Z23 Encounter for immunization: Secondary | ICD-10-CM | POA: Diagnosis not present

## 2016-07-04 DIAGNOSIS — Z992 Dependence on renal dialysis: Secondary | ICD-10-CM | POA: Diagnosis not present

## 2016-07-05 DIAGNOSIS — Z992 Dependence on renal dialysis: Secondary | ICD-10-CM | POA: Diagnosis not present

## 2016-07-05 DIAGNOSIS — N186 End stage renal disease: Secondary | ICD-10-CM | POA: Diagnosis not present

## 2016-07-05 DIAGNOSIS — Z23 Encounter for immunization: Secondary | ICD-10-CM | POA: Diagnosis not present

## 2016-07-06 DIAGNOSIS — Z992 Dependence on renal dialysis: Secondary | ICD-10-CM | POA: Diagnosis not present

## 2016-07-06 DIAGNOSIS — Z23 Encounter for immunization: Secondary | ICD-10-CM | POA: Diagnosis not present

## 2016-07-06 DIAGNOSIS — N186 End stage renal disease: Secondary | ICD-10-CM | POA: Diagnosis not present

## 2016-07-07 DIAGNOSIS — Z992 Dependence on renal dialysis: Secondary | ICD-10-CM | POA: Diagnosis not present

## 2016-07-07 DIAGNOSIS — Z23 Encounter for immunization: Secondary | ICD-10-CM | POA: Diagnosis not present

## 2016-07-07 DIAGNOSIS — N186 End stage renal disease: Secondary | ICD-10-CM | POA: Diagnosis not present

## 2016-07-08 DIAGNOSIS — Z23 Encounter for immunization: Secondary | ICD-10-CM | POA: Diagnosis not present

## 2016-07-08 DIAGNOSIS — N186 End stage renal disease: Secondary | ICD-10-CM | POA: Diagnosis not present

## 2016-07-08 DIAGNOSIS — Z992 Dependence on renal dialysis: Secondary | ICD-10-CM | POA: Diagnosis not present

## 2016-07-09 DIAGNOSIS — N186 End stage renal disease: Secondary | ICD-10-CM | POA: Diagnosis not present

## 2016-07-09 DIAGNOSIS — Z992 Dependence on renal dialysis: Secondary | ICD-10-CM | POA: Diagnosis not present

## 2016-07-09 DIAGNOSIS — Z23 Encounter for immunization: Secondary | ICD-10-CM | POA: Diagnosis not present

## 2016-07-10 DIAGNOSIS — Z23 Encounter for immunization: Secondary | ICD-10-CM | POA: Diagnosis not present

## 2016-07-10 DIAGNOSIS — Z992 Dependence on renal dialysis: Secondary | ICD-10-CM | POA: Diagnosis not present

## 2016-07-10 DIAGNOSIS — N186 End stage renal disease: Secondary | ICD-10-CM | POA: Diagnosis not present

## 2016-07-11 DIAGNOSIS — Z992 Dependence on renal dialysis: Secondary | ICD-10-CM | POA: Diagnosis not present

## 2016-07-11 DIAGNOSIS — Z23 Encounter for immunization: Secondary | ICD-10-CM | POA: Diagnosis not present

## 2016-07-11 DIAGNOSIS — N186 End stage renal disease: Secondary | ICD-10-CM | POA: Diagnosis not present

## 2016-07-12 DIAGNOSIS — Z23 Encounter for immunization: Secondary | ICD-10-CM | POA: Diagnosis not present

## 2016-07-12 DIAGNOSIS — Z992 Dependence on renal dialysis: Secondary | ICD-10-CM | POA: Diagnosis not present

## 2016-07-12 DIAGNOSIS — N186 End stage renal disease: Secondary | ICD-10-CM | POA: Diagnosis not present

## 2016-07-13 DIAGNOSIS — Z992 Dependence on renal dialysis: Secondary | ICD-10-CM | POA: Diagnosis not present

## 2016-07-13 DIAGNOSIS — N186 End stage renal disease: Secondary | ICD-10-CM | POA: Diagnosis not present

## 2016-07-13 DIAGNOSIS — Z23 Encounter for immunization: Secondary | ICD-10-CM | POA: Diagnosis not present

## 2016-07-14 DIAGNOSIS — Z23 Encounter for immunization: Secondary | ICD-10-CM | POA: Diagnosis not present

## 2016-07-14 DIAGNOSIS — N186 End stage renal disease: Secondary | ICD-10-CM | POA: Diagnosis not present

## 2016-07-14 DIAGNOSIS — Z992 Dependence on renal dialysis: Secondary | ICD-10-CM | POA: Diagnosis not present

## 2016-07-15 DIAGNOSIS — N186 End stage renal disease: Secondary | ICD-10-CM | POA: Diagnosis not present

## 2016-07-15 DIAGNOSIS — Z992 Dependence on renal dialysis: Secondary | ICD-10-CM | POA: Diagnosis not present

## 2016-07-15 DIAGNOSIS — Z23 Encounter for immunization: Secondary | ICD-10-CM | POA: Diagnosis not present

## 2016-07-16 DIAGNOSIS — N186 End stage renal disease: Secondary | ICD-10-CM | POA: Diagnosis not present

## 2016-07-16 DIAGNOSIS — Z23 Encounter for immunization: Secondary | ICD-10-CM | POA: Diagnosis not present

## 2016-07-16 DIAGNOSIS — Z992 Dependence on renal dialysis: Secondary | ICD-10-CM | POA: Diagnosis not present

## 2016-07-17 DIAGNOSIS — N186 End stage renal disease: Secondary | ICD-10-CM | POA: Diagnosis not present

## 2016-07-17 DIAGNOSIS — Z23 Encounter for immunization: Secondary | ICD-10-CM | POA: Diagnosis not present

## 2016-07-17 DIAGNOSIS — Z992 Dependence on renal dialysis: Secondary | ICD-10-CM | POA: Diagnosis not present

## 2016-07-18 DIAGNOSIS — Z23 Encounter for immunization: Secondary | ICD-10-CM | POA: Diagnosis not present

## 2016-07-18 DIAGNOSIS — Z992 Dependence on renal dialysis: Secondary | ICD-10-CM | POA: Diagnosis not present

## 2016-07-18 DIAGNOSIS — N186 End stage renal disease: Secondary | ICD-10-CM | POA: Diagnosis not present

## 2016-07-19 ENCOUNTER — Ambulatory Visit
Admission: RE | Admit: 2016-07-19 | Discharge: 2016-07-19 | Disposition: A | Discharge status: Home / Self Care | Payer: Commercial Managed Care - HMO | Source: Ambulatory Visit | Attending: Nephrology | Admitting: Nephrology

## 2016-07-19 ENCOUNTER — Other Ambulatory Visit: Payer: Self-pay | Admitting: Nephrology

## 2016-07-19 DIAGNOSIS — T85611A Breakdown (mechanical) of intraperitoneal dialysis catheter, initial encounter: Secondary | ICD-10-CM

## 2016-07-19 DIAGNOSIS — N186 End stage renal disease: Secondary | ICD-10-CM | POA: Diagnosis not present

## 2016-07-19 DIAGNOSIS — Z992 Dependence on renal dialysis: Secondary | ICD-10-CM | POA: Insufficient documentation

## 2016-07-19 DIAGNOSIS — Z23 Encounter for immunization: Secondary | ICD-10-CM | POA: Diagnosis not present

## 2016-07-20 DIAGNOSIS — Z01810 Encounter for preprocedural cardiovascular examination: Secondary | ICD-10-CM | POA: Diagnosis not present

## 2016-07-20 DIAGNOSIS — I517 Cardiomegaly: Secondary | ICD-10-CM | POA: Diagnosis not present

## 2016-07-20 DIAGNOSIS — Z23 Encounter for immunization: Secondary | ICD-10-CM | POA: Diagnosis not present

## 2016-07-20 DIAGNOSIS — Z7682 Awaiting organ transplant status: Secondary | ICD-10-CM | POA: Diagnosis not present

## 2016-07-20 DIAGNOSIS — N186 End stage renal disease: Secondary | ICD-10-CM | POA: Diagnosis not present

## 2016-07-20 DIAGNOSIS — F3289 Other specified depressive episodes: Secondary | ICD-10-CM | POA: Diagnosis not present

## 2016-07-20 DIAGNOSIS — F09 Unspecified mental disorder due to known physiological condition: Secondary | ICD-10-CM | POA: Diagnosis not present

## 2016-07-20 DIAGNOSIS — Z992 Dependence on renal dialysis: Secondary | ICD-10-CM | POA: Diagnosis not present

## 2016-07-21 DIAGNOSIS — Z992 Dependence on renal dialysis: Secondary | ICD-10-CM | POA: Diagnosis not present

## 2016-07-21 DIAGNOSIS — Z23 Encounter for immunization: Secondary | ICD-10-CM | POA: Diagnosis not present

## 2016-07-21 DIAGNOSIS — N186 End stage renal disease: Secondary | ICD-10-CM | POA: Diagnosis not present

## 2016-07-22 DIAGNOSIS — N186 End stage renal disease: Secondary | ICD-10-CM | POA: Diagnosis not present

## 2016-07-22 DIAGNOSIS — Z992 Dependence on renal dialysis: Secondary | ICD-10-CM | POA: Diagnosis not present

## 2016-07-23 DIAGNOSIS — E119 Type 2 diabetes mellitus without complications: Secondary | ICD-10-CM | POA: Diagnosis not present

## 2016-07-23 DIAGNOSIS — Z992 Dependence on renal dialysis: Secondary | ICD-10-CM | POA: Diagnosis not present

## 2016-07-23 DIAGNOSIS — N186 End stage renal disease: Secondary | ICD-10-CM | POA: Diagnosis not present

## 2016-07-24 DIAGNOSIS — N186 End stage renal disease: Secondary | ICD-10-CM | POA: Diagnosis not present

## 2016-07-24 DIAGNOSIS — Z992 Dependence on renal dialysis: Secondary | ICD-10-CM | POA: Diagnosis not present

## 2016-07-25 DIAGNOSIS — Z992 Dependence on renal dialysis: Secondary | ICD-10-CM | POA: Diagnosis not present

## 2016-07-25 DIAGNOSIS — N186 End stage renal disease: Secondary | ICD-10-CM | POA: Diagnosis not present

## 2016-07-26 DIAGNOSIS — Z992 Dependence on renal dialysis: Secondary | ICD-10-CM | POA: Diagnosis not present

## 2016-07-26 DIAGNOSIS — N186 End stage renal disease: Secondary | ICD-10-CM | POA: Diagnosis not present

## 2016-07-27 DIAGNOSIS — N186 End stage renal disease: Secondary | ICD-10-CM | POA: Diagnosis not present

## 2016-07-27 DIAGNOSIS — Z992 Dependence on renal dialysis: Secondary | ICD-10-CM | POA: Diagnosis not present

## 2016-07-28 DIAGNOSIS — Z992 Dependence on renal dialysis: Secondary | ICD-10-CM | POA: Diagnosis not present

## 2016-07-28 DIAGNOSIS — N186 End stage renal disease: Secondary | ICD-10-CM | POA: Diagnosis not present

## 2016-07-29 DIAGNOSIS — N186 End stage renal disease: Secondary | ICD-10-CM | POA: Diagnosis not present

## 2016-07-29 DIAGNOSIS — Z992 Dependence on renal dialysis: Secondary | ICD-10-CM | POA: Diagnosis not present

## 2016-07-30 DIAGNOSIS — N186 End stage renal disease: Secondary | ICD-10-CM | POA: Diagnosis not present

## 2016-07-30 DIAGNOSIS — Z992 Dependence on renal dialysis: Secondary | ICD-10-CM | POA: Diagnosis not present

## 2016-07-31 DIAGNOSIS — Z992 Dependence on renal dialysis: Secondary | ICD-10-CM | POA: Diagnosis not present

## 2016-07-31 DIAGNOSIS — N186 End stage renal disease: Secondary | ICD-10-CM | POA: Diagnosis not present

## 2016-08-01 DIAGNOSIS — Z992 Dependence on renal dialysis: Secondary | ICD-10-CM | POA: Diagnosis not present

## 2016-08-01 DIAGNOSIS — N186 End stage renal disease: Secondary | ICD-10-CM | POA: Diagnosis not present

## 2016-08-02 DIAGNOSIS — N186 End stage renal disease: Secondary | ICD-10-CM | POA: Diagnosis not present

## 2016-08-02 DIAGNOSIS — Z992 Dependence on renal dialysis: Secondary | ICD-10-CM | POA: Diagnosis not present

## 2016-08-03 DIAGNOSIS — N186 End stage renal disease: Secondary | ICD-10-CM | POA: Diagnosis not present

## 2016-08-03 DIAGNOSIS — Z992 Dependence on renal dialysis: Secondary | ICD-10-CM | POA: Diagnosis not present

## 2016-08-04 DIAGNOSIS — Z992 Dependence on renal dialysis: Secondary | ICD-10-CM | POA: Diagnosis not present

## 2016-08-04 DIAGNOSIS — N186 End stage renal disease: Secondary | ICD-10-CM | POA: Diagnosis not present

## 2016-08-05 DIAGNOSIS — N186 End stage renal disease: Secondary | ICD-10-CM | POA: Diagnosis not present

## 2016-08-05 DIAGNOSIS — Z992 Dependence on renal dialysis: Secondary | ICD-10-CM | POA: Diagnosis not present

## 2016-08-06 DIAGNOSIS — Z992 Dependence on renal dialysis: Secondary | ICD-10-CM | POA: Diagnosis not present

## 2016-08-06 DIAGNOSIS — N186 End stage renal disease: Secondary | ICD-10-CM | POA: Diagnosis not present

## 2016-08-07 DIAGNOSIS — Z992 Dependence on renal dialysis: Secondary | ICD-10-CM | POA: Diagnosis not present

## 2016-08-07 DIAGNOSIS — N186 End stage renal disease: Secondary | ICD-10-CM | POA: Diagnosis not present

## 2016-08-08 DIAGNOSIS — N186 End stage renal disease: Secondary | ICD-10-CM | POA: Diagnosis not present

## 2016-08-08 DIAGNOSIS — Z992 Dependence on renal dialysis: Secondary | ICD-10-CM | POA: Diagnosis not present

## 2016-08-09 ENCOUNTER — Encounter (HOSPITAL_COMMUNITY): Payer: Self-pay | Admitting: *Deleted

## 2016-08-09 DIAGNOSIS — Z992 Dependence on renal dialysis: Secondary | ICD-10-CM | POA: Diagnosis not present

## 2016-08-09 DIAGNOSIS — I130 Hypertensive heart and chronic kidney disease with heart failure and stage 1 through stage 4 chronic kidney disease, or unspecified chronic kidney disease: Secondary | ICD-10-CM | POA: Diagnosis not present

## 2016-08-09 DIAGNOSIS — I5032 Chronic diastolic (congestive) heart failure: Secondary | ICD-10-CM | POA: Diagnosis not present

## 2016-08-09 DIAGNOSIS — N183 Chronic kidney disease, stage 3 (moderate): Secondary | ICD-10-CM | POA: Insufficient documentation

## 2016-08-09 DIAGNOSIS — Z7984 Long term (current) use of oral hypoglycemic drugs: Secondary | ICD-10-CM | POA: Insufficient documentation

## 2016-08-09 DIAGNOSIS — N3000 Acute cystitis without hematuria: Secondary | ICD-10-CM | POA: Diagnosis not present

## 2016-08-09 DIAGNOSIS — R1110 Vomiting, unspecified: Secondary | ICD-10-CM | POA: Diagnosis present

## 2016-08-09 DIAGNOSIS — N186 End stage renal disease: Secondary | ICD-10-CM | POA: Diagnosis not present

## 2016-08-09 DIAGNOSIS — E1122 Type 2 diabetes mellitus with diabetic chronic kidney disease: Secondary | ICD-10-CM | POA: Diagnosis not present

## 2016-08-09 LAB — URINE MICROSCOPIC-ADD ON

## 2016-08-09 LAB — CBC
HEMATOCRIT: 30.5 % — AB (ref 39.0–52.0)
HEMOGLOBIN: 10.2 g/dL — AB (ref 13.0–17.0)
MCH: 27.9 pg (ref 26.0–34.0)
MCHC: 33.4 g/dL (ref 30.0–36.0)
MCV: 83.6 fL (ref 78.0–100.0)
Platelets: 189 10*3/uL (ref 150–400)
RBC: 3.65 MIL/uL — AB (ref 4.22–5.81)
RDW: 17.3 % — ABNORMAL HIGH (ref 11.5–15.5)
WBC: 7.3 10*3/uL (ref 4.0–10.5)

## 2016-08-09 LAB — URINALYSIS, ROUTINE W REFLEX MICROSCOPIC
Glucose, UA: NEGATIVE mg/dL
Ketones, ur: NEGATIVE mg/dL
Nitrite: NEGATIVE
Protein, ur: >300 mg/dL — AB
SPECIFIC GRAVITY, URINE: 1.013 (ref 1.005–1.030)
pH: 5 (ref 5.0–8.0)

## 2016-08-09 LAB — COMPREHENSIVE METABOLIC PANEL
ALT: 52 U/L (ref 17–63)
ANION GAP: 10 (ref 5–15)
AST: 26 U/L (ref 15–41)
Albumin: 3 g/dL — ABNORMAL LOW (ref 3.5–5.0)
Alkaline Phosphatase: 74 U/L (ref 38–126)
BUN: 75 mg/dL — ABNORMAL HIGH (ref 6–20)
CALCIUM: 8.7 mg/dL — AB (ref 8.9–10.3)
CHLORIDE: 99 mmol/L — AB (ref 101–111)
CO2: 24 mmol/L (ref 22–32)
Creatinine, Ser: 6.57 mg/dL — ABNORMAL HIGH (ref 0.61–1.24)
GFR calc non Af Amer: 8 mL/min — ABNORMAL LOW (ref >60)
GFR, EST AFRICAN AMERICAN: 9 mL/min — AB (ref >60)
Glucose, Bld: 97 mg/dL (ref 65–99)
POTASSIUM: 3.7 mmol/L (ref 3.5–5.1)
SODIUM: 133 mmol/L — AB (ref 135–145)
Total Bilirubin: 0.5 mg/dL (ref 0.3–1.2)
Total Protein: 7 g/dL (ref 6.5–8.1)

## 2016-08-09 MED ORDER — ONDANSETRON 4 MG PO TBDP
4.0000 mg | ORAL_TABLET | Freq: Once | ORAL | Status: AC | PRN
  Administered 2016-08-09: 4 mg via ORAL

## 2016-08-09 MED ORDER — ONDANSETRON 4 MG PO TBDP
ORAL_TABLET | ORAL | Status: AC
  Filled 2016-08-09: qty 1
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
## 2016-08-10 ENCOUNTER — Emergency Department (HOSPITAL_COMMUNITY)
Admission: EM | Admit: 2016-08-10 | Discharge: 2016-08-10 | Disposition: A | Discharge status: Home / Self Care | Payer: Commercial Managed Care - HMO | Attending: Emergency Medicine | Admitting: Emergency Medicine

## 2016-08-10 DIAGNOSIS — Z992 Dependence on renal dialysis: Secondary | ICD-10-CM | POA: Diagnosis not present

## 2016-08-10 DIAGNOSIS — N186 End stage renal disease: Secondary | ICD-10-CM | POA: Diagnosis not present

## 2016-08-10 DIAGNOSIS — N3000 Acute cystitis without hematuria: Secondary | ICD-10-CM

## 2016-08-10 HISTORY — DX: Dependence on renal dialysis: Z99.2

## 2016-08-10 MED ORDER — CEPHALEXIN 500 MG PO CAPS: 500.0000 mg | ORAL_CAPSULE | Freq: Two times a day (BID) | ORAL | 0 refills | Status: DC

## 2016-08-10 MED ORDER — CEFTRIAXONE SODIUM 1 G IJ SOLR
1.0000 g | Freq: Once | INTRAMUSCULAR | Status: AC
  Administered 2016-08-10: 1 g via INTRAVENOUS
  Filled 2016-08-10: qty 10
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
## 2016-08-11 DIAGNOSIS — N186 End stage renal disease: Secondary | ICD-10-CM | POA: Diagnosis not present

## 2016-08-11 DIAGNOSIS — Z992 Dependence on renal dialysis: Secondary | ICD-10-CM | POA: Diagnosis not present

## 2016-08-12 DIAGNOSIS — Z992 Dependence on renal dialysis: Secondary | ICD-10-CM | POA: Diagnosis not present

## 2016-08-12 DIAGNOSIS — N186 End stage renal disease: Secondary | ICD-10-CM | POA: Diagnosis not present

## 2016-08-13 DIAGNOSIS — Z992 Dependence on renal dialysis: Secondary | ICD-10-CM | POA: Diagnosis not present

## 2016-08-13 DIAGNOSIS — N186 End stage renal disease: Secondary | ICD-10-CM | POA: Diagnosis not present

## 2016-08-14 DIAGNOSIS — N186 End stage renal disease: Secondary | ICD-10-CM | POA: Diagnosis not present

## 2016-08-14 DIAGNOSIS — Z992 Dependence on renal dialysis: Secondary | ICD-10-CM | POA: Diagnosis not present

## 2016-08-15 DIAGNOSIS — Z992 Dependence on renal dialysis: Secondary | ICD-10-CM | POA: Diagnosis not present

## 2016-08-15 DIAGNOSIS — N186 End stage renal disease: Secondary | ICD-10-CM | POA: Diagnosis not present

## 2016-08-16 ENCOUNTER — Encounter
Admission: RE | Admit: 2016-08-16 | Discharge: 2016-08-16 | Disposition: A | Discharge status: Home / Self Care | Payer: Commercial Managed Care - HMO | Source: Ambulatory Visit | Attending: Vascular Surgery | Admitting: Vascular Surgery

## 2016-08-16 ENCOUNTER — Encounter (INDEPENDENT_AMBULATORY_CARE_PROVIDER_SITE_OTHER): Payer: Self-pay

## 2016-08-16 ENCOUNTER — Encounter (INDEPENDENT_AMBULATORY_CARE_PROVIDER_SITE_OTHER): Payer: Self-pay | Admitting: Vascular Surgery

## 2016-08-16 ENCOUNTER — Other Ambulatory Visit (INDEPENDENT_AMBULATORY_CARE_PROVIDER_SITE_OTHER): Payer: Self-pay | Admitting: Vascular Surgery

## 2016-08-16 ENCOUNTER — Encounter: Payer: Self-pay | Admitting: *Deleted

## 2016-08-16 ENCOUNTER — Ambulatory Visit (INDEPENDENT_AMBULATORY_CARE_PROVIDER_SITE_OTHER): Payer: Commercial Managed Care - HMO | Admitting: Vascular Surgery

## 2016-08-16 VITALS — BP 149/63 | HR 60 | Resp 17 | Ht 73.5 in | Wt 252.0 lb

## 2016-08-16 DIAGNOSIS — E1121 Type 2 diabetes mellitus with diabetic nephropathy: Secondary | ICD-10-CM | POA: Diagnosis not present

## 2016-08-16 DIAGNOSIS — N186 End stage renal disease: Secondary | ICD-10-CM | POA: Diagnosis not present

## 2016-08-16 DIAGNOSIS — I10 Essential (primary) hypertension: Secondary | ICD-10-CM | POA: Diagnosis not present

## 2016-08-16 DIAGNOSIS — IMO0002 Reserved for concepts with insufficient information to code with codable children: Secondary | ICD-10-CM

## 2016-08-16 DIAGNOSIS — T829XXA Unspecified complication of cardiac and vascular prosthetic device, implant and graft, initial encounter: Secondary | ICD-10-CM

## 2016-08-16 DIAGNOSIS — E1165 Type 2 diabetes mellitus with hyperglycemia: Secondary | ICD-10-CM | POA: Diagnosis not present

## 2016-08-16 LAB — BASIC METABOLIC PANEL
Anion gap: 11 (ref 5–15)
BUN: 92 mg/dL — ABNORMAL HIGH (ref 6–20)
CALCIUM: 9 mg/dL (ref 8.9–10.3)
CO2: 25 mmol/L (ref 22–32)
CREATININE: 6.9 mg/dL — AB (ref 0.61–1.24)
Chloride: 100 mmol/L — ABNORMAL LOW (ref 101–111)
GFR calc Af Amer: 9 mL/min — ABNORMAL LOW (ref >60)
GFR calc non Af Amer: 8 mL/min — ABNORMAL LOW (ref >60)
GLUCOSE: 104 mg/dL — AB (ref 65–99)
Potassium: 3.7 mmol/L (ref 3.5–5.1)
Sodium: 136 mmol/L (ref 135–145)

## 2016-08-16 LAB — CBC WITH DIFFERENTIAL/PLATELET
BASOS PCT: 1 %
Basophils Absolute: 0.1 10*3/uL (ref 0–0.1)
EOS ABS: 0.2 10*3/uL (ref 0–0.7)
Eosinophils Relative: 2 %
HEMATOCRIT: 35.2 % — AB (ref 40.0–52.0)
Hemoglobin: 11.3 g/dL — ABNORMAL LOW (ref 13.0–18.0)
Lymphocytes Relative: 32 %
Lymphs Abs: 2.1 10*3/uL (ref 1.0–3.6)
MCH: 27.8 pg (ref 26.0–34.0)
MCHC: 32.1 g/dL (ref 32.0–36.0)
MCV: 86.8 fL (ref 80.0–100.0)
MONO ABS: 0.7 10*3/uL (ref 0.2–1.0)
MONOS PCT: 10 %
NEUTROS ABS: 3.6 10*3/uL (ref 1.4–6.5)
Neutrophils Relative %: 55 %
Platelets: 192 10*3/uL (ref 150–440)
RBC: 4.06 MIL/uL — ABNORMAL LOW (ref 4.40–5.90)
RDW: 17.9 % — AB (ref 11.5–14.5)
WBC: 6.6 10*3/uL (ref 3.8–10.6)

## 2016-08-16 LAB — PROTIME-INR
INR: 1.02
Prothrombin Time: 13.4 seconds (ref 11.4–15.2)

## 2016-08-16 LAB — TYPE AND SCREEN
ABO/RH(D): O POS
Antibody Screen: NEGATIVE

## 2016-08-16 LAB — SURGICAL PCR SCREEN
MRSA, PCR: NEGATIVE
STAPHYLOCOCCUS AUREUS: NEGATIVE

## 2016-08-16 LAB — APTT: aPTT: 31 seconds (ref 24–36)

## 2016-08-16 NOTE — Patient Instructions (Signed)
  Your procedure is scheduled on: August 20, 2016 (Monday) Report to Same Day Surgery 2nd floor medical mall To find out your arrival time please call [phone] between 1PM - 3PM on August 17, 2016 (Friday)   Remember: Instructions that are not followed completely may result in serious medical risk, up to and including death, or upon the discretion of your surgeon and anesthesiologist your surgery may need to be rescheduled.    _x___ 1. Do not eat food or drink liquids after midnight. No gum chewing or hard candies.     __x__ 2. No Alcohol for 24 hours before or after surgery.   __x__3. No Smoking for 24 prior to surgery.   ____  4. Bring all medications with you on the day of surgery if instructed.    __x__ 5. Notify your doctor if there is any change in your medical condition     (cold, fever, infections).     Do not wear jewelry, make-up, hairpins, clips or nail polish.  Do not wear lotions, powders, or perfumes. You may wear deodorant.  Do not shave 48 hours prior to surgery. Men may shave face and neck.  Do not bring valuables to the hospital.    [facility] is not responsible for any belongings or valuables.               Contacts, dentures or bridgework may not be worn into surgery.  Leave your suitcase in the car. After surgery it may be brought to your room.  For patients admitted to the hospital, discharge time is determined by your treatment team.   Patients discharged the day of surgery will not be allowed to drive home.    Please read over the following fact sheets that you were given:   [facility] Preparing for Surgery and or MRSA Information   _x___ Take these medicines the morning of surgery with A SIP OF WATER:    1. Amlodipine  2. Atorvastatin  3. HydrAlazine  4. Metoprolol  5. Losartan  6.  ____Fleets enema or Magnesium Citrate as directed.   _x___ Use CHG Soap or sage wipes as directed on instruction sheet   ____ Use inhalers on the day of  surgery and bring to hospital day of surgery  ____ Stop metformin 2 days prior to surgery    ____ Take 1/2 of usual insulin dose the night before surgery and none on the morning of surgery.             __x__ Stop aspirin or coumadin, or plavix (NO ASPIRIN)  x__ Stop Anti-inflammatories such as Advil, Aleve, Ibuprofen, Motrin, Naproxen,          Naprosyn, Goodies powders or aspirin products. Ok to take Tylenol.   ____ Stop supplements until after surgery.    ____ Bring C-Pap to the hospital.
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
## 2016-08-16 NOTE — Pre-Procedure Instructions (Signed)
MET B results faxed to Dr. Lucky Cowboy office.

## 2016-08-17 DIAGNOSIS — Z992 Dependence on renal dialysis: Secondary | ICD-10-CM | POA: Diagnosis not present

## 2016-08-17 DIAGNOSIS — N186 End stage renal disease: Secondary | ICD-10-CM | POA: Diagnosis not present

## 2016-08-18 DIAGNOSIS — N186 End stage renal disease: Secondary | ICD-10-CM | POA: Diagnosis not present

## 2016-08-18 DIAGNOSIS — Z992 Dependence on renal dialysis: Secondary | ICD-10-CM | POA: Diagnosis not present

## 2016-08-19 DIAGNOSIS — N186 End stage renal disease: Secondary | ICD-10-CM | POA: Diagnosis not present

## 2016-08-19 DIAGNOSIS — Z992 Dependence on renal dialysis: Secondary | ICD-10-CM | POA: Diagnosis not present

## 2016-08-19 MED ORDER — CEFAZOLIN SODIUM-DEXTROSE 2-4 GM/100ML-% IV SOLN
2.0000 g | INTRAVENOUS | Status: AC
  Administered 2016-08-20: 2 g via INTRAVENOUS

## 2016-08-20 ENCOUNTER — Ambulatory Visit: Payer: Commercial Managed Care - HMO | Admitting: Anesthesiology

## 2016-08-20 ENCOUNTER — Ambulatory Visit
Admission: RE | Admit: 2016-08-20 | Discharge: 2016-08-20 | Disposition: A | Discharge status: Home / Self Care | Payer: Commercial Managed Care - HMO | Source: Ambulatory Visit | Attending: Vascular Surgery | Admitting: Vascular Surgery

## 2016-08-20 ENCOUNTER — Encounter: Payer: Self-pay | Admitting: *Deleted

## 2016-08-20 ENCOUNTER — Encounter
Admission: RE | Disposition: A | Discharge status: Home / Self Care | Payer: Self-pay | Source: Ambulatory Visit | Attending: Vascular Surgery

## 2016-08-20 DIAGNOSIS — Y828 Other medical devices associated with adverse incidents: Secondary | ICD-10-CM | POA: Diagnosis not present

## 2016-08-20 DIAGNOSIS — D631 Anemia in chronic kidney disease: Secondary | ICD-10-CM | POA: Diagnosis not present

## 2016-08-20 DIAGNOSIS — T829XXA Unspecified complication of cardiac and vascular prosthetic device, implant and graft, initial encounter: Secondary | ICD-10-CM | POA: Diagnosis not present

## 2016-08-20 DIAGNOSIS — N529 Male erectile dysfunction, unspecified: Secondary | ICD-10-CM | POA: Insufficient documentation

## 2016-08-20 DIAGNOSIS — Z8042 Family history of malignant neoplasm of prostate: Secondary | ICD-10-CM | POA: Insufficient documentation

## 2016-08-20 DIAGNOSIS — Z886 Allergy status to analgesic agent status: Secondary | ICD-10-CM | POA: Insufficient documentation

## 2016-08-20 DIAGNOSIS — Z91041 Radiographic dye allergy status: Secondary | ICD-10-CM | POA: Insufficient documentation

## 2016-08-20 DIAGNOSIS — Z992 Dependence on renal dialysis: Secondary | ICD-10-CM | POA: Insufficient documentation

## 2016-08-20 DIAGNOSIS — E119 Type 2 diabetes mellitus without complications: Secondary | ICD-10-CM | POA: Diagnosis not present

## 2016-08-20 DIAGNOSIS — I509 Heart failure, unspecified: Secondary | ICD-10-CM | POA: Insufficient documentation

## 2016-08-20 DIAGNOSIS — M109 Gout, unspecified: Secondary | ICD-10-CM | POA: Insufficient documentation

## 2016-08-20 DIAGNOSIS — E785 Hyperlipidemia, unspecified: Secondary | ICD-10-CM | POA: Diagnosis not present

## 2016-08-20 DIAGNOSIS — T85691A Other mechanical complication of intraperitoneal dialysis catheter, initial encounter: Secondary | ICD-10-CM | POA: Diagnosis not present

## 2016-08-20 DIAGNOSIS — T829XXD Unspecified complication of cardiac and vascular prosthetic device, implant and graft, subsequent encounter: Secondary | ICD-10-CM

## 2016-08-20 DIAGNOSIS — F419 Anxiety disorder, unspecified: Secondary | ICD-10-CM | POA: Diagnosis not present

## 2016-08-20 DIAGNOSIS — Z7984 Long term (current) use of oral hypoglycemic drugs: Secondary | ICD-10-CM | POA: Diagnosis not present

## 2016-08-20 DIAGNOSIS — Y838 Other surgical procedures as the cause of abnormal reaction of the patient, or of later complication, without mention of misadventure at the time of the procedure: Secondary | ICD-10-CM | POA: Insufficient documentation

## 2016-08-20 DIAGNOSIS — Z8249 Family history of ischemic heart disease and other diseases of the circulatory system: Secondary | ICD-10-CM | POA: Diagnosis not present

## 2016-08-20 DIAGNOSIS — I132 Hypertensive heart and chronic kidney disease with heart failure and with stage 5 chronic kidney disease, or end stage renal disease: Secondary | ICD-10-CM | POA: Insufficient documentation

## 2016-08-20 DIAGNOSIS — N186 End stage renal disease: Secondary | ICD-10-CM | POA: Diagnosis not present

## 2016-08-20 DIAGNOSIS — E1122 Type 2 diabetes mellitus with diabetic chronic kidney disease: Secondary | ICD-10-CM | POA: Diagnosis not present

## 2016-08-20 DIAGNOSIS — Z91013 Allergy to seafood: Secondary | ICD-10-CM | POA: Insufficient documentation

## 2016-08-20 DIAGNOSIS — T82858A Stenosis of vascular prosthetic devices, implants and grafts, initial encounter: Secondary | ICD-10-CM | POA: Diagnosis not present

## 2016-08-20 DIAGNOSIS — F329 Major depressive disorder, single episode, unspecified: Secondary | ICD-10-CM | POA: Diagnosis not present

## 2016-08-20 DIAGNOSIS — I10 Essential (primary) hypertension: Secondary | ICD-10-CM | POA: Diagnosis not present

## 2016-08-20 HISTORY — PX: CAPD INSERTION: SHX5233

## 2016-08-20 LAB — GLUCOSE, CAPILLARY
Glucose-Capillary: 120 mg/dL — ABNORMAL HIGH (ref 65–99)
Glucose-Capillary: 147 mg/dL — ABNORMAL HIGH (ref 65–99)

## 2016-08-20 LAB — POCT I-STAT 4, (NA,K, GLUC, HGB,HCT)
Glucose, Bld: 132 mg/dL — ABNORMAL HIGH (ref 65–99)
HCT: 34 % — ABNORMAL LOW (ref 39.0–52.0)
HEMOGLOBIN: 11.6 g/dL — AB (ref 13.0–17.0)
Potassium: 3.9 mmol/L (ref 3.5–5.1)
SODIUM: 140 mmol/L (ref 135–145)

## 2016-08-20 SURGERY — LAPAROSCOPIC INSERTION CONTINUOUS AMBULATORY PERITONEAL DIALYSIS  (CAPD) CATHETER
Anesthesia: General | Wound class: Clean Contaminated

## 2016-08-20 MED ORDER — FAMOTIDINE 20 MG PO TABS
ORAL_TABLET | ORAL | Status: AC
  Administered 2016-08-20: 20 mg via ORAL
  Filled 2016-08-20: qty 1

## 2016-08-20 MED ORDER — FENTANYL CITRATE (PF) 100 MCG/2ML IJ SOLN
25.0000 ug | INTRAMUSCULAR | Status: DC | PRN
  Administered 2016-08-20 (×2): 25 ug via INTRAVENOUS

## 2016-08-20 MED ORDER — FAMOTIDINE 20 MG PO TABS
20.0000 mg | ORAL_TABLET | Freq: Once | ORAL | Status: AC
  Administered 2016-08-20: 20 mg via ORAL

## 2016-08-20 MED ORDER — DEXAMETHASONE SODIUM PHOSPHATE 10 MG/ML IJ SOLN
INTRAMUSCULAR | Status: DC | PRN
  Administered 2016-08-20: 10 mg via INTRAVENOUS

## 2016-08-20 MED ORDER — SUCCINYLCHOLINE CHLORIDE 20 MG/ML IJ SOLN
INTRAMUSCULAR | Status: DC | PRN
  Administered 2016-08-20: 120 mg via INTRAVENOUS

## 2016-08-20 MED ORDER — CHLORHEXIDINE GLUCONATE CLOTH 2 % EX PADS
6.0000 | MEDICATED_PAD | Freq: Once | CUTANEOUS | Status: AC
  Administered 2016-08-20: 6 via TOPICAL

## 2016-08-20 MED ORDER — SODIUM CHLORIDE 0.9 % IV SOLN
INTRAVENOUS | Status: DC
  Administered 2016-08-20 (×2): via INTRAVENOUS

## 2016-08-20 MED ORDER — FENTANYL CITRATE (PF) 100 MCG/2ML IJ SOLN
INTRAMUSCULAR | Status: AC
  Administered 2016-08-20: 25 ug via INTRAVENOUS
  Filled 2016-08-20: qty 2

## 2016-08-20 MED ORDER — MIDAZOLAM HCL 2 MG/2ML IJ SOLN
INTRAMUSCULAR | Status: DC | PRN
  Administered 2016-08-20: 2 mg via INTRAVENOUS

## 2016-08-20 MED ORDER — GLYCOPYRROLATE 0.2 MG/ML IJ SOLN
INTRAMUSCULAR | Status: DC | PRN
  Administered 2016-08-20: 0.2 mg via INTRAVENOUS

## 2016-08-20 MED ORDER — CEFAZOLIN SODIUM-DEXTROSE 2-4 GM/100ML-% IV SOLN
INTRAVENOUS | Status: AC
  Administered 2016-08-20: 2 g via INTRAVENOUS
  Filled 2016-08-20: qty 100

## 2016-08-20 MED ORDER — PROPOFOL 10 MG/ML IV BOLUS
INTRAVENOUS | Status: DC | PRN
  Administered 2016-08-20: 170 mg via INTRAVENOUS

## 2016-08-20 MED ORDER — CHLORHEXIDINE GLUCONATE CLOTH 2 % EX PADS: 6.0000 | MEDICATED_PAD | Freq: Once | CUTANEOUS | Status: DC

## 2016-08-20 MED ORDER — ROCURONIUM BROMIDE 100 MG/10ML IV SOLN
INTRAVENOUS | Status: DC | PRN
  Administered 2016-08-20: 20 mg via INTRAVENOUS
  Administered 2016-08-20: 10 mg via INTRAVENOUS

## 2016-08-20 MED ORDER — ONDANSETRON HCL 4 MG/2ML IJ SOLN
INTRAMUSCULAR | Status: DC | PRN
  Administered 2016-08-20: 4 mg via INTRAVENOUS

## 2016-08-20 MED ORDER — EPHEDRINE SULFATE 50 MG/ML IJ SOLN
INTRAMUSCULAR | Status: DC | PRN
  Administered 2016-08-20: 5 mg via INTRAVENOUS
  Administered 2016-08-20: 10 mg via INTRAVENOUS

## 2016-08-20 MED ORDER — ONDANSETRON HCL 4 MG/2ML IJ SOLN
INTRAMUSCULAR | Status: AC
  Filled 2016-08-20: qty 2

## 2016-08-20 MED ORDER — ONDANSETRON HCL 4 MG/2ML IJ SOLN
4.0000 mg | Freq: Once | INTRAMUSCULAR | Status: AC | PRN
Start: 2016-08-20 — End: 2016-08-20
  Administered 2016-08-20: 4 mg via INTRAVENOUS

## 2016-08-20 MED ORDER — SUGAMMADEX SODIUM 500 MG/5ML IV SOLN
INTRAVENOUS | Status: DC | PRN
  Administered 2016-08-20: 228.6 mg via INTRAVENOUS

## 2016-08-20 SURGICAL SUPPLY — 33 items
ADAPTER BETA CAP QUINTON DIALY (ADAPTER) ×2 IMPLANT
ADAPTER CATH DIALYSIS 18.75 (CATHETERS) IMPLANT
BLADE CLIPPER SURG (BLADE) ×2 IMPLANT
CANISTER SUCT 1200ML W/VALVE (MISCELLANEOUS) ×2 IMPLANT
CATH DLYS SWAN NECK 62.5CM (CATHETERS) IMPLANT
CHLORAPREP W/TINT 26ML (MISCELLANEOUS) ×2 IMPLANT
DERMABOND ADVANCED (GAUZE/BANDAGES/DRESSINGS) ×1
DERMABOND ADVANCED .7 DNX12 (GAUZE/BANDAGES/DRESSINGS) ×1 IMPLANT
ELECT CAUTERY BLADE 6.4 (BLADE) ×2 IMPLANT
ELECT REM PT RETURN 9FT ADLT (ELECTROSURGICAL) ×2
ELECTRODE REM PT RTRN 9FT ADLT (ELECTROSURGICAL) ×1 IMPLANT
GLOVE BIO SURGEON STRL SZ7 (GLOVE) ×8 IMPLANT
GLOVE INDICATOR 7.5 STRL GRN (GLOVE) IMPLANT
GOWN STRL REUS W/ TWL LRG LVL3 (GOWN DISPOSABLE) ×1 IMPLANT
GOWN STRL REUS W/ TWL XL LVL3 (GOWN DISPOSABLE) ×1 IMPLANT
GOWN STRL REUS W/TWL LRG LVL3 (GOWN DISPOSABLE) ×1
GOWN STRL REUS W/TWL XL LVL3 (GOWN DISPOSABLE) ×1
IV NS 500ML (IV SOLUTION) ×1
IV NS 500ML BAXH (IV SOLUTION) ×1 IMPLANT
KIT RM TURNOVER STRD PROC AR (KITS) ×2 IMPLANT
LABEL OR SOLS (LABEL) ×2 IMPLANT
MINICAP W/POVIDONE IODINE SOL (MISCELLANEOUS) ×2 IMPLANT
PACK LAP CHOLECYSTECTOMY (MISCELLANEOUS) ×2 IMPLANT
PENCIL ELECTRO HAND CTR (MISCELLANEOUS) ×2 IMPLANT
SET CYSTO W/LG BORE CLAMP LF (SET/KITS/TRAYS/PACK) ×2 IMPLANT
SET TRANSFER 6 W/TWIST CLAMP 5 (SET/KITS/TRAYS/PACK) ×2 IMPLANT
SPONGE EXCIL AMD DRAIN 4X4 6P (MISCELLANEOUS) ×2 IMPLANT
SUT MNCRL AB 4-0 PS2 18 (SUTURE) ×2 IMPLANT
SUT VIC AB 2-0 UR6 27 (SUTURE) ×2 IMPLANT
SUT VICRYL+ 3-0 36IN CT-1 (SUTURE) ×4 IMPLANT
TROCAR XCEL NON-BLD 11X100MML (ENDOMECHANICALS) ×2 IMPLANT
TROCAR XCEL NON-BLD 5MMX100MML (ENDOMECHANICALS) ×2 IMPLANT
TUBING INSUFFLATOR HI FLOW (MISCELLANEOUS) ×2 IMPLANT
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
## 2016-08-20 NOTE — Op Note (Signed)
  OPERATIVE NOTE   PROCEDURE: 1. Laparoscopic peritoneal dialysis catheter revision.  PRE-OPERATIVE DIAGNOSIS: 1. ESRD 2. Poorly functioning peritoneal dialysis catheter  POST-OPERATIVE DIAGNOSIS: Same  SURGEON: Leotis Pain, MD  ASSISTANT(S): None  ANESTHESIA: general  ESTIMATED BLOOD LOSS: Minimal   FINDING(S): 1. Scant adhesions with the catheter out of the pelvis and in the right upper quadrant  SPECIMEN(S): None  INDICATIONS:  Patient presents with a poorly functioning PD cathetr. The patient has decided to do peritoneal dialysis for his long-term dialysis, but this is not working well. Risks and benefits of revision were discussed and he is agreeable to proceed.  Differences between peritoneal dialysis and hemodialysis were discussed.    DESCRIPTION: After obtaining full informed written consent, the patient was brought back to the operating room and placed supine upon the operating table. The patient received IV antibiotics prior to induction. After obtaining adequate anesthesia, the abdomen was prepped and draped in the standard fashion. A small transverse incision was created just to the right of the umbilicus in the RUQ.  An omniview trocar was then placed under direct visualization entering the peritoneal cavity and then insufflating the abdomen with carbon dioxide. The camera was then replaced and the catheter was identified. Scant adhesions with the catheter out of the pelvis and in the right upper quadrant. I then placed a 5 mm trocar in the left lower quadrant. A blunt grasper was used to get the catheter out of the right upper quadrant and then replace it in the pelvis. It went into the pelvis easily without any tension and seemed to be parked reasonably deep in the pelvis with a good coil configuration of the catheter. The appropriate distal connectors were placed, and I then placed 500 cc of saline through the catheter into the pelvis. The abdomen was desufflated.  Immediately, 400 cc of effluent returned through the catheter when the bag was placed to gravity. I took one more look with the camera to ensure that the catheter was in the pelvis and it was. The 16mm trocar was then removed. I then closed the incisions with 3-0 Vicryl and 4-0 Monocryl and placed Dermabond as dressing. Dry dressing was placed around the catheter exit site. The patient was then awakened from anesthesia and taken to the recovery room in stable condition having tolerated the procedure well.  COMPLICATIONS: None  CONDITION: None  Leotis Pain, MD 08/20/2016 1:15 PM   This note was created with Dragon Medical transcription system. Any errors in dictation are purely unintentional.

## 2016-08-20 NOTE — Progress Notes (Signed)
Nausea better taking ice chips well

## 2016-08-20 NOTE — Anesthesia Procedure Notes (Signed)
Procedure Name: Intubation Date/Time: 08/20/2016 12:25 PM Performed by: Nelda Marseille Pre-anesthesia Checklist: Patient identified, Patient being monitored, Timeout performed, Emergency Drugs available and Suction available Patient Re-evaluated:Patient Re-evaluated prior to inductionOxygen Delivery Method: Circle system utilized Preoxygenation: Pre-oxygenation with 100% oxygen Intubation Type: IV induction Ventilation: Mask ventilation without difficulty Laryngoscope Size: Mac and 3 Grade View: Grade I Tube type: Oral Tube size: 7.5 mm Number of attempts: 1 Airway Equipment and Method: Stylet Placement Confirmation: ETT inserted through vocal cords under direct vision,  positive ETCO2 and breath sounds checked- equal and bilateral Secured at: 21 cm Tube secured with: Tape Dental Injury: Teeth and Oropharynx as per pre-operative assessment

## 2016-08-20 NOTE — Discharge Instructions (Addendum)
AMBULATORY SURGERY  °DISCHARGE INSTRUCTIONS ° ° °1) The drugs that you were given will stay in your system until tomorrow so for the next 24 hours you should not: ° °A) Drive an automobile °B) Make any legal decisions °C) Drink any alcoholic beverage ° ° °2) You may resume regular meals tomorrow.  Today it is better to start with liquids and gradually work up to solid foods. ° °You may eat anything you prefer, but it is better to start with liquids, then soup and crackers, and gradually work up to solid foods. ° ° °3) Please notify your doctor immediately if you have any unusual bleeding, trouble breathing, redness and pain at the surgery site, drainage, fever, or pain not relieved by medication. ° ° ° °4) Additional Instructions: ° ° ° ° ° ° ° °Please contact your physician with any problems or Same Day Surgery at [phone], Monday through Friday 6 am to 4 pm, or [place] [facility] [place] Main number at [phone].AMBULATORY SURGERY  °DISCHARGE INSTRUCTIONS ° ° °5) The drugs that you were given will stay in your system until tomorrow so for the next 24 hours you should not: ° °D) Drive an automobile °E) Make any legal decisions °F) Drink any alcoholic beverage ° ° °6) You may resume regular meals tomorrow.  Today it is better to start with liquids and gradually work up to solid foods. ° °You may eat anything you prefer, but it is better to start with liquids, then soup and crackers, and gradually work up to solid foods. ° ° °7) Please notify your doctor immediately if you have any unusual bleeding, trouble breathing, redness and pain at the surgery site, drainage, fever, or pain not relieved by medication. ° ° ° °8) Additional Instructions: ° ° ° ° ° ° ° °Please contact your physician with any problems or Same Day Surgery at [phone], Monday through Friday 6 am to 4 pm, or [place] at  Main number at [phone]. °

## 2016-08-20 NOTE — Progress Notes (Signed)
Catheter dressing clean and dry with gauze and tape

## 2016-08-20 NOTE — Anesthesia Postprocedure Evaluation (Signed)
Anesthesia Post Note  Patient: Deantre Bourdon Denson  Procedure(s) Performed: Procedure(s) (LRB): LAPAROSCOPIC INSERTION CONTINUOUS AMBULATORY PERITONEAL DIALYSIS  (CAPD) CATHETER ( REVISION ) (N/A)  Patient location during evaluation: PACU Anesthesia Type: General Level of consciousness: awake and alert Pain management: pain level controlled Vital Signs Assessment: post-procedure vital signs reviewed and stable Respiratory status: spontaneous breathing, nonlabored ventilation, respiratory function stable and patient connected to nasal cannula oxygen Cardiovascular status: blood pressure returned to baseline and stable Postop Assessment: no signs of nausea or vomiting Anesthetic complications: no    Last Vitals:  Vitals:   08/20/16 1338 08/20/16 1343  BP:  (!) 119/58  Pulse: 64 64  Resp: 16 12  Temp: (!) 36 C     Last Pain:  Vitals:   08/20/16 1338  TempSrc:   PainSc: 3                  Molli Barrows
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
## 2016-08-20 NOTE — Progress Notes (Signed)
zofran given  Feels like he needs to vomit

## 2016-08-20 NOTE — Transfer of Care (Signed)
Immediate Anesthesia Transfer of Care Note  Patient: Richard Chen  Procedure(s) Performed: Procedure(s): LAPAROSCOPIC INSERTION CONTINUOUS AMBULATORY PERITONEAL DIALYSIS  (CAPD) CATHETER ( REVISION ) (N/A)  Patient Location: PACU  Anesthesia Type:General  Level of Consciousness: awake and sedated  Airway & Oxygen Therapy: Patient Spontanous Breathing and Patient connected to face mask oxygen  Post-op Assessment: Report given to RN and Post -op Vital signs reviewed and stable  Post vital signs: Reviewed and stable  Last Vitals:  Vitals:   08/20/16 1026  BP: (!) 130/95  Pulse: 62  Resp: 16  Temp: 36.8 C    Last Pain:  Vitals:   08/20/16 1026  TempSrc: Oral  PainSc: 0-No pain         Complications: No apparent anesthesia complications

## 2016-08-21 DIAGNOSIS — N186 End stage renal disease: Secondary | ICD-10-CM | POA: Diagnosis not present

## 2016-08-21 DIAGNOSIS — Z992 Dependence on renal dialysis: Secondary | ICD-10-CM | POA: Diagnosis not present

## 2016-08-22 DIAGNOSIS — N186 End stage renal disease: Secondary | ICD-10-CM | POA: Diagnosis not present

## 2016-08-22 DIAGNOSIS — Z992 Dependence on renal dialysis: Secondary | ICD-10-CM | POA: Diagnosis not present

## 2016-08-23 DIAGNOSIS — Z992 Dependence on renal dialysis: Secondary | ICD-10-CM | POA: Diagnosis not present

## 2016-08-23 DIAGNOSIS — N186 End stage renal disease: Secondary | ICD-10-CM | POA: Diagnosis not present

## 2016-08-24 DIAGNOSIS — Z992 Dependence on renal dialysis: Secondary | ICD-10-CM | POA: Diagnosis not present

## 2016-08-24 DIAGNOSIS — N186 End stage renal disease: Secondary | ICD-10-CM | POA: Diagnosis not present

## 2016-08-25 DIAGNOSIS — Z992 Dependence on renal dialysis: Secondary | ICD-10-CM | POA: Diagnosis not present

## 2016-08-25 DIAGNOSIS — N186 End stage renal disease: Secondary | ICD-10-CM | POA: Diagnosis not present

## 2016-08-26 DIAGNOSIS — N186 End stage renal disease: Secondary | ICD-10-CM | POA: Diagnosis not present

## 2016-08-26 DIAGNOSIS — Z992 Dependence on renal dialysis: Secondary | ICD-10-CM | POA: Diagnosis not present

## 2016-08-27 DIAGNOSIS — Z992 Dependence on renal dialysis: Secondary | ICD-10-CM | POA: Diagnosis not present

## 2016-08-27 DIAGNOSIS — N186 End stage renal disease: Secondary | ICD-10-CM | POA: Diagnosis not present

## 2016-08-28 DIAGNOSIS — Z992 Dependence on renal dialysis: Secondary | ICD-10-CM | POA: Diagnosis not present

## 2016-08-28 DIAGNOSIS — N186 End stage renal disease: Secondary | ICD-10-CM | POA: Diagnosis not present

## 2016-08-29 DIAGNOSIS — Z992 Dependence on renal dialysis: Secondary | ICD-10-CM | POA: Diagnosis not present

## 2016-08-29 DIAGNOSIS — N186 End stage renal disease: Secondary | ICD-10-CM | POA: Diagnosis not present

## 2016-08-30 DIAGNOSIS — N186 End stage renal disease: Secondary | ICD-10-CM | POA: Diagnosis not present

## 2016-08-30 DIAGNOSIS — Z992 Dependence on renal dialysis: Secondary | ICD-10-CM | POA: Diagnosis not present

## 2016-08-31 DIAGNOSIS — Z992 Dependence on renal dialysis: Secondary | ICD-10-CM | POA: Diagnosis not present

## 2016-08-31 DIAGNOSIS — N186 End stage renal disease: Secondary | ICD-10-CM | POA: Diagnosis not present

## 2016-09-01 DIAGNOSIS — N186 End stage renal disease: Secondary | ICD-10-CM | POA: Diagnosis not present

## 2016-09-01 DIAGNOSIS — Z992 Dependence on renal dialysis: Secondary | ICD-10-CM | POA: Diagnosis not present

## 2016-09-02 DIAGNOSIS — Z992 Dependence on renal dialysis: Secondary | ICD-10-CM | POA: Diagnosis not present

## 2016-09-02 DIAGNOSIS — N186 End stage renal disease: Secondary | ICD-10-CM | POA: Diagnosis not present

## 2016-09-03 DIAGNOSIS — N186 End stage renal disease: Secondary | ICD-10-CM | POA: Diagnosis not present

## 2016-09-03 DIAGNOSIS — Z992 Dependence on renal dialysis: Secondary | ICD-10-CM | POA: Diagnosis not present

## 2016-09-04 DIAGNOSIS — N186 End stage renal disease: Secondary | ICD-10-CM | POA: Diagnosis not present

## 2016-09-04 DIAGNOSIS — Z992 Dependence on renal dialysis: Secondary | ICD-10-CM | POA: Diagnosis not present

## 2016-09-05 DIAGNOSIS — N186 End stage renal disease: Secondary | ICD-10-CM | POA: Diagnosis not present

## 2016-09-05 DIAGNOSIS — Z992 Dependence on renal dialysis: Secondary | ICD-10-CM | POA: Diagnosis not present

## 2016-09-06 DIAGNOSIS — N186 End stage renal disease: Secondary | ICD-10-CM | POA: Diagnosis not present

## 2016-09-06 DIAGNOSIS — Z992 Dependence on renal dialysis: Secondary | ICD-10-CM | POA: Diagnosis not present

## 2016-09-07 DIAGNOSIS — N186 End stage renal disease: Secondary | ICD-10-CM | POA: Diagnosis not present

## 2016-09-07 DIAGNOSIS — Z992 Dependence on renal dialysis: Secondary | ICD-10-CM | POA: Diagnosis not present

## 2016-09-08 DIAGNOSIS — Z992 Dependence on renal dialysis: Secondary | ICD-10-CM | POA: Diagnosis not present

## 2016-09-08 DIAGNOSIS — N186 End stage renal disease: Secondary | ICD-10-CM | POA: Diagnosis not present

## 2016-09-09 DIAGNOSIS — N186 End stage renal disease: Secondary | ICD-10-CM | POA: Diagnosis not present

## 2016-09-09 DIAGNOSIS — Z992 Dependence on renal dialysis: Secondary | ICD-10-CM | POA: Diagnosis not present

## 2016-09-10 ENCOUNTER — Encounter (INDEPENDENT_AMBULATORY_CARE_PROVIDER_SITE_OTHER): Payer: Commercial Managed Care - HMO | Admitting: Vascular Surgery

## 2016-09-10 ENCOUNTER — Encounter (INDEPENDENT_AMBULATORY_CARE_PROVIDER_SITE_OTHER): Payer: Self-pay | Admitting: Vascular Surgery

## 2016-09-10 ENCOUNTER — Ambulatory Visit (INDEPENDENT_AMBULATORY_CARE_PROVIDER_SITE_OTHER): Payer: Commercial Managed Care - HMO | Admitting: Vascular Surgery

## 2016-09-10 ENCOUNTER — Ambulatory Visit (INDEPENDENT_AMBULATORY_CARE_PROVIDER_SITE_OTHER): Payer: Commercial Managed Care - HMO | Admitting: Internal Medicine

## 2016-09-10 ENCOUNTER — Encounter: Payer: Self-pay | Admitting: Internal Medicine

## 2016-09-10 VITALS — BP 132/57 | HR 70 | Resp 16 | Ht 73.5 in | Wt 246.0 lb

## 2016-09-10 VITALS — BP 106/70 | HR 55 | Temp 98.1°F | Wt 247.0 lb

## 2016-09-10 DIAGNOSIS — Z992 Dependence on renal dialysis: Secondary | ICD-10-CM

## 2016-09-10 DIAGNOSIS — E785 Hyperlipidemia, unspecified: Secondary | ICD-10-CM

## 2016-09-10 DIAGNOSIS — E1121 Type 2 diabetes mellitus with diabetic nephropathy: Secondary | ICD-10-CM | POA: Diagnosis not present

## 2016-09-10 DIAGNOSIS — S069X0S Unspecified intracranial injury without loss of consciousness, sequela: Secondary | ICD-10-CM | POA: Diagnosis not present

## 2016-09-10 DIAGNOSIS — I5032 Chronic diastolic (congestive) heart failure: Secondary | ICD-10-CM | POA: Diagnosis not present

## 2016-09-10 DIAGNOSIS — N186 End stage renal disease: Secondary | ICD-10-CM

## 2016-09-10 DIAGNOSIS — T829XXS Unspecified complication of cardiac and vascular prosthetic device, implant and graft, sequela: Secondary | ICD-10-CM

## 2016-09-10 LAB — HEMOGLOBIN A1C: Hgb A1c MFr Bld: 6.2 % (ref 4.6–6.5)
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
## 2016-09-10 NOTE — Assessment & Plan Note (Signed)
Peritoneal dialysis Hopes for renal transplant

## 2016-09-10 NOTE — Progress Notes (Signed)
Pre visit review using our clinic review tool, if applicable. No additional management support is needed unless otherwise documented below in the visit note. 
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
## 2016-09-10 NOTE — Assessment & Plan Note (Signed)
This seems stable.  °

## 2016-09-10 NOTE — Assessment & Plan Note (Signed)
Compensated. No changes needed. 

## 2016-09-10 NOTE — Assessment & Plan Note (Signed)
If A1c still low, will cut back and then stop the glipizide

## 2016-09-11 DIAGNOSIS — Z992 Dependence on renal dialysis: Secondary | ICD-10-CM | POA: Diagnosis not present

## 2016-09-11 DIAGNOSIS — N186 End stage renal disease: Secondary | ICD-10-CM | POA: Diagnosis not present

## 2016-09-12 DIAGNOSIS — Z992 Dependence on renal dialysis: Secondary | ICD-10-CM | POA: Diagnosis not present

## 2016-09-12 DIAGNOSIS — N186 End stage renal disease: Secondary | ICD-10-CM | POA: Diagnosis not present

## 2016-09-13 DIAGNOSIS — Z992 Dependence on renal dialysis: Secondary | ICD-10-CM | POA: Diagnosis not present

## 2016-09-13 DIAGNOSIS — N186 End stage renal disease: Secondary | ICD-10-CM | POA: Diagnosis not present

## 2016-09-14 DIAGNOSIS — Z992 Dependence on renal dialysis: Secondary | ICD-10-CM | POA: Diagnosis not present

## 2016-09-14 DIAGNOSIS — N186 End stage renal disease: Secondary | ICD-10-CM | POA: Diagnosis not present

## 2016-09-15 DIAGNOSIS — N186 End stage renal disease: Secondary | ICD-10-CM | POA: Diagnosis not present

## 2016-09-15 DIAGNOSIS — Z992 Dependence on renal dialysis: Secondary | ICD-10-CM | POA: Diagnosis not present

## 2016-09-16 DIAGNOSIS — Z992 Dependence on renal dialysis: Secondary | ICD-10-CM | POA: Diagnosis not present

## 2016-09-16 DIAGNOSIS — N186 End stage renal disease: Secondary | ICD-10-CM | POA: Diagnosis not present

## 2016-09-17 DIAGNOSIS — Z992 Dependence on renal dialysis: Secondary | ICD-10-CM | POA: Diagnosis not present

## 2016-09-17 DIAGNOSIS — N186 End stage renal disease: Secondary | ICD-10-CM | POA: Diagnosis not present

## 2016-09-18 DIAGNOSIS — Z992 Dependence on renal dialysis: Secondary | ICD-10-CM | POA: Diagnosis not present

## 2016-09-18 DIAGNOSIS — N186 End stage renal disease: Secondary | ICD-10-CM | POA: Diagnosis not present

## 2016-09-19 DIAGNOSIS — Z992 Dependence on renal dialysis: Secondary | ICD-10-CM | POA: Diagnosis not present

## 2016-09-19 DIAGNOSIS — N186 End stage renal disease: Secondary | ICD-10-CM | POA: Diagnosis not present

## 2016-09-20 DIAGNOSIS — Z992 Dependence on renal dialysis: Secondary | ICD-10-CM | POA: Diagnosis not present

## 2016-09-20 DIAGNOSIS — N186 End stage renal disease: Secondary | ICD-10-CM | POA: Diagnosis not present

## 2016-09-21 DIAGNOSIS — Z992 Dependence on renal dialysis: Secondary | ICD-10-CM | POA: Diagnosis not present

## 2016-09-21 DIAGNOSIS — T85898A Other specified complication of other internal prosthetic devices, implants and grafts, initial encounter: Secondary | ICD-10-CM | POA: Diagnosis not present

## 2016-09-21 DIAGNOSIS — T85848A Pain due to other internal prosthetic devices, implants and grafts, initial encounter: Secondary | ICD-10-CM | POA: Diagnosis not present

## 2016-09-21 DIAGNOSIS — N186 End stage renal disease: Secondary | ICD-10-CM | POA: Diagnosis not present

## 2016-09-21 DIAGNOSIS — Z23 Encounter for immunization: Secondary | ICD-10-CM | POA: Diagnosis not present

## 2016-09-22 DIAGNOSIS — T85848A Pain due to other internal prosthetic devices, implants and grafts, initial encounter: Secondary | ICD-10-CM | POA: Diagnosis not present

## 2016-09-22 DIAGNOSIS — T85898A Other specified complication of other internal prosthetic devices, implants and grafts, initial encounter: Secondary | ICD-10-CM | POA: Diagnosis not present

## 2016-09-22 DIAGNOSIS — Z992 Dependence on renal dialysis: Secondary | ICD-10-CM | POA: Diagnosis not present

## 2016-09-22 DIAGNOSIS — Z23 Encounter for immunization: Secondary | ICD-10-CM | POA: Diagnosis not present

## 2016-09-22 DIAGNOSIS — N186 End stage renal disease: Secondary | ICD-10-CM | POA: Diagnosis not present

## 2016-09-23 DIAGNOSIS — Z992 Dependence on renal dialysis: Secondary | ICD-10-CM | POA: Diagnosis not present

## 2016-09-23 DIAGNOSIS — N186 End stage renal disease: Secondary | ICD-10-CM | POA: Diagnosis not present

## 2016-09-23 DIAGNOSIS — Z23 Encounter for immunization: Secondary | ICD-10-CM | POA: Diagnosis not present

## 2016-09-23 DIAGNOSIS — T85848A Pain due to other internal prosthetic devices, implants and grafts, initial encounter: Secondary | ICD-10-CM | POA: Diagnosis not present

## 2016-09-23 DIAGNOSIS — T85898A Other specified complication of other internal prosthetic devices, implants and grafts, initial encounter: Secondary | ICD-10-CM | POA: Diagnosis not present

## 2016-09-24 DIAGNOSIS — N186 End stage renal disease: Secondary | ICD-10-CM | POA: Diagnosis not present

## 2016-09-24 DIAGNOSIS — Z992 Dependence on renal dialysis: Secondary | ICD-10-CM | POA: Diagnosis not present

## 2016-09-24 DIAGNOSIS — Z23 Encounter for immunization: Secondary | ICD-10-CM | POA: Diagnosis not present

## 2016-09-24 DIAGNOSIS — T85848A Pain due to other internal prosthetic devices, implants and grafts, initial encounter: Secondary | ICD-10-CM | POA: Diagnosis not present

## 2016-09-24 DIAGNOSIS — T85898A Other specified complication of other internal prosthetic devices, implants and grafts, initial encounter: Secondary | ICD-10-CM | POA: Diagnosis not present

## 2016-09-25 DIAGNOSIS — Z992 Dependence on renal dialysis: Secondary | ICD-10-CM | POA: Diagnosis not present

## 2016-09-25 DIAGNOSIS — T85848A Pain due to other internal prosthetic devices, implants and grafts, initial encounter: Secondary | ICD-10-CM | POA: Diagnosis not present

## 2016-09-25 DIAGNOSIS — T85898A Other specified complication of other internal prosthetic devices, implants and grafts, initial encounter: Secondary | ICD-10-CM | POA: Diagnosis not present

## 2016-09-25 DIAGNOSIS — N186 End stage renal disease: Secondary | ICD-10-CM | POA: Diagnosis not present

## 2016-09-25 DIAGNOSIS — Z23 Encounter for immunization: Secondary | ICD-10-CM | POA: Diagnosis not present

## 2016-09-26 DIAGNOSIS — Z23 Encounter for immunization: Secondary | ICD-10-CM | POA: Diagnosis not present

## 2016-09-26 DIAGNOSIS — T85848A Pain due to other internal prosthetic devices, implants and grafts, initial encounter: Secondary | ICD-10-CM | POA: Diagnosis not present

## 2016-09-26 DIAGNOSIS — Z992 Dependence on renal dialysis: Secondary | ICD-10-CM | POA: Diagnosis not present

## 2016-09-26 DIAGNOSIS — T85898A Other specified complication of other internal prosthetic devices, implants and grafts, initial encounter: Secondary | ICD-10-CM | POA: Diagnosis not present

## 2016-09-26 DIAGNOSIS — N186 End stage renal disease: Secondary | ICD-10-CM | POA: Diagnosis not present

## 2016-09-27 DIAGNOSIS — Z992 Dependence on renal dialysis: Secondary | ICD-10-CM | POA: Diagnosis not present

## 2016-09-27 DIAGNOSIS — N186 End stage renal disease: Secondary | ICD-10-CM | POA: Diagnosis not present

## 2016-09-27 DIAGNOSIS — Z23 Encounter for immunization: Secondary | ICD-10-CM | POA: Diagnosis not present

## 2016-09-27 DIAGNOSIS — T85848A Pain due to other internal prosthetic devices, implants and grafts, initial encounter: Secondary | ICD-10-CM | POA: Diagnosis not present

## 2016-09-27 DIAGNOSIS — T85898A Other specified complication of other internal prosthetic devices, implants and grafts, initial encounter: Secondary | ICD-10-CM | POA: Diagnosis not present

## 2016-09-28 DIAGNOSIS — N186 End stage renal disease: Secondary | ICD-10-CM | POA: Diagnosis not present

## 2016-09-28 DIAGNOSIS — T85898A Other specified complication of other internal prosthetic devices, implants and grafts, initial encounter: Secondary | ICD-10-CM | POA: Diagnosis not present

## 2016-09-28 DIAGNOSIS — T85848A Pain due to other internal prosthetic devices, implants and grafts, initial encounter: Secondary | ICD-10-CM | POA: Diagnosis not present

## 2016-09-28 DIAGNOSIS — Z992 Dependence on renal dialysis: Secondary | ICD-10-CM | POA: Diagnosis not present

## 2016-09-28 DIAGNOSIS — Z23 Encounter for immunization: Secondary | ICD-10-CM | POA: Diagnosis not present

## 2016-09-29 DIAGNOSIS — Z23 Encounter for immunization: Secondary | ICD-10-CM | POA: Diagnosis not present

## 2016-09-29 DIAGNOSIS — N186 End stage renal disease: Secondary | ICD-10-CM | POA: Diagnosis not present

## 2016-09-29 DIAGNOSIS — T85898A Other specified complication of other internal prosthetic devices, implants and grafts, initial encounter: Secondary | ICD-10-CM | POA: Diagnosis not present

## 2016-09-29 DIAGNOSIS — T85848A Pain due to other internal prosthetic devices, implants and grafts, initial encounter: Secondary | ICD-10-CM | POA: Diagnosis not present

## 2016-09-29 DIAGNOSIS — Z992 Dependence on renal dialysis: Secondary | ICD-10-CM | POA: Diagnosis not present

## 2016-09-30 DIAGNOSIS — T85898A Other specified complication of other internal prosthetic devices, implants and grafts, initial encounter: Secondary | ICD-10-CM | POA: Diagnosis not present

## 2016-09-30 DIAGNOSIS — Z992 Dependence on renal dialysis: Secondary | ICD-10-CM | POA: Diagnosis not present

## 2016-09-30 DIAGNOSIS — Z23 Encounter for immunization: Secondary | ICD-10-CM | POA: Diagnosis not present

## 2016-09-30 DIAGNOSIS — T85848A Pain due to other internal prosthetic devices, implants and grafts, initial encounter: Secondary | ICD-10-CM | POA: Diagnosis not present

## 2016-09-30 DIAGNOSIS — N186 End stage renal disease: Secondary | ICD-10-CM | POA: Diagnosis not present

## 2016-10-01 DIAGNOSIS — Z992 Dependence on renal dialysis: Secondary | ICD-10-CM | POA: Diagnosis not present

## 2016-10-01 DIAGNOSIS — Z23 Encounter for immunization: Secondary | ICD-10-CM | POA: Diagnosis not present

## 2016-10-01 DIAGNOSIS — T85898A Other specified complication of other internal prosthetic devices, implants and grafts, initial encounter: Secondary | ICD-10-CM | POA: Diagnosis not present

## 2016-10-01 DIAGNOSIS — T85848A Pain due to other internal prosthetic devices, implants and grafts, initial encounter: Secondary | ICD-10-CM | POA: Diagnosis not present

## 2016-10-01 DIAGNOSIS — N186 End stage renal disease: Secondary | ICD-10-CM | POA: Diagnosis not present

## 2016-10-02 ENCOUNTER — Ambulatory Visit
Admission: RE | Admit: 2016-10-02 | Discharge: 2016-10-02 | Disposition: A | Discharge status: Home / Self Care | Payer: Commercial Managed Care - HMO | Source: Ambulatory Visit | Attending: Gastroenterology | Admitting: Gastroenterology

## 2016-10-02 ENCOUNTER — Encounter
Admission: RE | Disposition: A | Discharge status: Home / Self Care | Payer: Self-pay | Source: Ambulatory Visit | Attending: Gastroenterology

## 2016-10-02 ENCOUNTER — Ambulatory Visit: Payer: Commercial Managed Care - HMO | Admitting: Certified Registered Nurse Anesthetist

## 2016-10-02 ENCOUNTER — Encounter: Payer: Self-pay | Admitting: *Deleted

## 2016-10-02 DIAGNOSIS — D123 Benign neoplasm of transverse colon: Secondary | ICD-10-CM | POA: Insufficient documentation

## 2016-10-02 DIAGNOSIS — E785 Hyperlipidemia, unspecified: Secondary | ICD-10-CM | POA: Diagnosis not present

## 2016-10-02 DIAGNOSIS — F329 Major depressive disorder, single episode, unspecified: Secondary | ICD-10-CM | POA: Insufficient documentation

## 2016-10-02 DIAGNOSIS — Z23 Encounter for immunization: Secondary | ICD-10-CM | POA: Diagnosis not present

## 2016-10-02 DIAGNOSIS — Z992 Dependence on renal dialysis: Secondary | ICD-10-CM | POA: Insufficient documentation

## 2016-10-02 DIAGNOSIS — Z1211 Encounter for screening for malignant neoplasm of colon: Secondary | ICD-10-CM | POA: Diagnosis not present

## 2016-10-02 DIAGNOSIS — N186 End stage renal disease: Secondary | ICD-10-CM | POA: Diagnosis not present

## 2016-10-02 DIAGNOSIS — Z79899 Other long term (current) drug therapy: Secondary | ICD-10-CM | POA: Insufficient documentation

## 2016-10-02 DIAGNOSIS — I110 Hypertensive heart disease with heart failure: Secondary | ICD-10-CM | POA: Diagnosis not present

## 2016-10-02 DIAGNOSIS — Z7984 Long term (current) use of oral hypoglycemic drugs: Secondary | ICD-10-CM | POA: Insufficient documentation

## 2016-10-02 DIAGNOSIS — E1122 Type 2 diabetes mellitus with diabetic chronic kidney disease: Secondary | ICD-10-CM | POA: Insufficient documentation

## 2016-10-02 DIAGNOSIS — D649 Anemia, unspecified: Secondary | ICD-10-CM | POA: Insufficient documentation

## 2016-10-02 DIAGNOSIS — K635 Polyp of colon: Secondary | ICD-10-CM | POA: Diagnosis not present

## 2016-10-02 DIAGNOSIS — T85848A Pain due to other internal prosthetic devices, implants and grafts, initial encounter: Secondary | ICD-10-CM | POA: Diagnosis not present

## 2016-10-02 DIAGNOSIS — I132 Hypertensive heart and chronic kidney disease with heart failure and with stage 5 chronic kidney disease, or end stage renal disease: Secondary | ICD-10-CM | POA: Insufficient documentation

## 2016-10-02 DIAGNOSIS — I509 Heart failure, unspecified: Secondary | ICD-10-CM | POA: Diagnosis not present

## 2016-10-02 DIAGNOSIS — F419 Anxiety disorder, unspecified: Secondary | ICD-10-CM | POA: Diagnosis not present

## 2016-10-02 DIAGNOSIS — M109 Gout, unspecified: Secondary | ICD-10-CM | POA: Insufficient documentation

## 2016-10-02 DIAGNOSIS — T85898A Other specified complication of other internal prosthetic devices, implants and grafts, initial encounter: Secondary | ICD-10-CM | POA: Diagnosis not present

## 2016-10-02 HISTORY — PX: COLONOSCOPY WITH PROPOFOL: SHX5780

## 2016-10-02 LAB — GLUCOSE, CAPILLARY: GLUCOSE-CAPILLARY: 117 mg/dL — AB (ref 65–99)

## 2016-10-02 SURGERY — COLONOSCOPY WITH PROPOFOL
Anesthesia: General

## 2016-10-02 MED ORDER — SODIUM CHLORIDE 0.9 % IV SOLN: INTRAVENOUS | Status: DC

## 2016-10-02 MED ORDER — PROPOFOL 10 MG/ML IV BOLUS
INTRAVENOUS | Status: DC | PRN
  Administered 2016-10-02: 70 mg via INTRAVENOUS
  Administered 2016-10-02: 10 mg via INTRAVENOUS

## 2016-10-02 MED ORDER — PROPOFOL 500 MG/50ML IV EMUL
INTRAVENOUS | Status: DC | PRN
  Administered 2016-10-02: 140 ug/kg/min via INTRAVENOUS

## 2016-10-02 MED ORDER — SODIUM CHLORIDE 0.9 % IV SOLN
INTRAVENOUS | Status: DC
  Administered 2016-10-02: 1000 mL via INTRAVENOUS

## 2016-10-02 NOTE — Transfer of Care (Signed)
Immediate Anesthesia Transfer of Care Note  Patient: Richard Chen  Procedure(s) Performed: Procedure(s): COLONOSCOPY WITH PROPOFOL (N/A)  Patient Location: PACU  Anesthesia Type:General  Level of Consciousness: sedated  Airway & Oxygen Therapy: Patient Spontanous Breathing and Patient connected to nasal cannula oxygen  Post-op Assessment: Report given to RN and Post -op Vital signs reviewed and stable  Post vital signs: Reviewed and stable  Last Vitals:  Vitals:   10/02/16 0820  BP: (!) 153/70  Pulse: 62  Resp: 20  Temp: 36.7 C    Last Pain:  Vitals:   10/02/16 0820  TempSrc: Tympanic         Complications: No apparent anesthesia complications

## 2016-10-02 NOTE — Anesthesia Preprocedure Evaluation (Signed)
Anesthesia Evaluation  Patient identified by MRN, date of birth, ID band Patient awake    Reviewed: Allergy & Precautions, NPO status , Patient's Chart, lab work & pertinent test results  History of Anesthesia Complications Negative for: history of anesthetic complications  Airway Mallampati: II       Dental   Pulmonary neg pulmonary ROS,           Cardiovascular hypertension, Pt. on medications and Pt. on home beta blockers +CHF (hx)       Neuro/Psych Anxiety Depression negative neurological ROS     GI/Hepatic negative GI ROS, Neg liver ROS,   Endo/Other  diabetes, Type 2, Oral Hypoglycemic Agents  Renal/GU Dialysis and ESRFRenal disease (Perotoneal dialysis)     Musculoskeletal   Abdominal   Peds  Hematology  (+) anemia ,   Anesthesia Other Findings   Reproductive/Obstetrics                             Anesthesia Physical Anesthesia Plan  ASA: IV  Anesthesia Plan: General   Post-op Pain Management:    Induction:   Airway Management Planned: Nasal Cannula  Additional Equipment:   Intra-op Plan:   Post-operative Plan:   Informed Consent: I have reviewed the patients History and Physical, chart, labs and discussed the procedure including the risks, benefits and alternatives for the proposed anesthesia with the patient or authorized representative who has indicated his/her understanding and acceptance.     Plan Discussed with:   Anesthesia Plan Comments:         Anesthesia Quick Evaluation
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
## 2016-10-02 NOTE — Anesthesia Procedure Notes (Signed)
Performed by: Winter Jocelyn Pre-anesthesia Checklist: Patient identified, Emergency Drugs available, Suction available, Patient being monitored and Timeout performed Patient Re-evaluated:Patient Re-evaluated prior to inductionOxygen Delivery Method: Nasal cannula Intubation Type: IV induction       

## 2016-10-02 NOTE — Op Note (Signed)
[facility] Gastroenterology Patient Name: Richard Chen Procedure Date: 10/02/2016 9:12 AM MRN: [id number] Account #: [id number] Date of Birth: 1956/10/08 Admit Type: Outpatient Age: 60 Room: [facility] ENDO ROOM 3 Gender: Male Note Status: Finalized Procedure:            Colonoscopy Indications:          Screening for colorectal malignant neoplasm Providers:            Lollie Sails, MD Referring MD:         Venia Carbon (Referring MD) Medicines:            Monitored Anesthesia Care Complications:        No immediate complications. Procedure:            Pre-Anesthesia Assessment:                       - ASA Grade Assessment: III - A patient with severe                        systemic disease.                       After obtaining informed consent, the colonoscope was                        passed under direct vision. Throughout the procedure,                        the patient's blood pressure, pulse, and oxygen                        saturations were monitored continuously. The                        Colonoscope was introduced through the anus and                        advanced to the the cecum, identified by appendiceal                        orifice and ileocecal valve. The colonoscopy was                        performed without difficulty. The patient tolerated the                        procedure well. The quality of the bowel preparation                        was good. Findings:      A 4 mm polyp was found in the transverse colon. The polyp was flat. The       polyp was removed with a cold snare. Resection and retrieval were       complete.      The retroflexed view of the distal rectum and anal verge was normal and       showed no anal or rectal abnormalities.      The digital rectal exam was normal. Impression:           - One 4 mm polyp in the transverse colon, removed with  a cold snare. Resected and retrieved.                - The distal rectum and anal verge are normal on                        retroflexion view. Recommendation:       - Discharge patient to home.                       - Await pathology results.                       - Telephone GI clinic for pathology results in 1 week. Procedure Code(s):    --- Professional ---                       [phone], Colonoscopy, flexible; with removal of tumor(s),                        polyp(s), or other lesion(s) by snare technique Diagnosis Code(s):    --- Professional ---                       Z12.11, Encounter for screening for malignant neoplasm                        of colon                       D12.3, Benign neoplasm of transverse colon (hepatic                        flexure or splenic flexure) CPT copyright 2016 American Medical Association. All rights reserved. The codes documented in this report are preliminary and upon coder review may  be revised to meet current compliance requirements. Lollie Sails, MD 10/02/2016 9:39:06 AM This report has been signed electronically. Number of Addenda: 0 Note Initiated On: 10/02/2016 9:12 AM Scope Withdrawal Time: 0 hours 7 minutes 48 seconds  Total Procedure Duration: 0 hours 17 minutes 23 seconds       [facility]

## 2016-10-02 NOTE — Anesthesia Postprocedure Evaluation (Signed)
Anesthesia Post Note  Patient: Richard Chen  Procedure(s) Performed: Procedure(s) (LRB): COLONOSCOPY WITH PROPOFOL (N/A)  Patient location during evaluation: Endoscopy Anesthesia Type: General Level of consciousness: awake and alert Pain management: pain level controlled Vital Signs Assessment: post-procedure vital signs reviewed and stable Respiratory status: spontaneous breathing and respiratory function stable Cardiovascular status: stable Anesthetic complications: no    Last Vitals:  Vitals:   10/02/16 1000 10/02/16 1010  BP: (!) 152/76 (!) 148/69  Pulse: (!) 48 (!) 50  Resp: 13 12  Temp:      Last Pain:  Vitals:   10/02/16 0940  TempSrc: Tympanic                 Yatzari Jonsson K

## 2016-10-03 DIAGNOSIS — T85848A Pain due to other internal prosthetic devices, implants and grafts, initial encounter: Secondary | ICD-10-CM | POA: Diagnosis not present

## 2016-10-03 DIAGNOSIS — Z992 Dependence on renal dialysis: Secondary | ICD-10-CM | POA: Diagnosis not present

## 2016-10-03 DIAGNOSIS — N186 End stage renal disease: Secondary | ICD-10-CM | POA: Diagnosis not present

## 2016-10-03 DIAGNOSIS — Z23 Encounter for immunization: Secondary | ICD-10-CM | POA: Diagnosis not present

## 2016-10-03 DIAGNOSIS — T85898A Other specified complication of other internal prosthetic devices, implants and grafts, initial encounter: Secondary | ICD-10-CM | POA: Diagnosis not present

## 2016-10-03 LAB — SURGICAL PATHOLOGY

## 2016-10-04 ENCOUNTER — Encounter: Payer: Self-pay | Admitting: Gastroenterology

## 2016-10-04 DIAGNOSIS — Z23 Encounter for immunization: Secondary | ICD-10-CM | POA: Diagnosis not present

## 2016-10-04 DIAGNOSIS — Z992 Dependence on renal dialysis: Secondary | ICD-10-CM | POA: Diagnosis not present

## 2016-10-04 DIAGNOSIS — N186 End stage renal disease: Secondary | ICD-10-CM | POA: Diagnosis not present

## 2016-10-04 DIAGNOSIS — T85898A Other specified complication of other internal prosthetic devices, implants and grafts, initial encounter: Secondary | ICD-10-CM | POA: Diagnosis not present

## 2016-10-04 DIAGNOSIS — T85848A Pain due to other internal prosthetic devices, implants and grafts, initial encounter: Secondary | ICD-10-CM | POA: Diagnosis not present

## 2016-10-05 DIAGNOSIS — N186 End stage renal disease: Secondary | ICD-10-CM | POA: Diagnosis not present

## 2016-10-05 DIAGNOSIS — Z23 Encounter for immunization: Secondary | ICD-10-CM | POA: Diagnosis not present

## 2016-10-05 DIAGNOSIS — Z992 Dependence on renal dialysis: Secondary | ICD-10-CM | POA: Diagnosis not present

## 2016-10-05 DIAGNOSIS — T85848A Pain due to other internal prosthetic devices, implants and grafts, initial encounter: Secondary | ICD-10-CM | POA: Diagnosis not present

## 2016-10-05 DIAGNOSIS — T85898A Other specified complication of other internal prosthetic devices, implants and grafts, initial encounter: Secondary | ICD-10-CM | POA: Diagnosis not present

## 2016-10-06 DIAGNOSIS — Z23 Encounter for immunization: Secondary | ICD-10-CM | POA: Diagnosis not present

## 2016-10-06 DIAGNOSIS — N186 End stage renal disease: Secondary | ICD-10-CM | POA: Diagnosis not present

## 2016-10-06 DIAGNOSIS — T85848A Pain due to other internal prosthetic devices, implants and grafts, initial encounter: Secondary | ICD-10-CM | POA: Diagnosis not present

## 2016-10-06 DIAGNOSIS — T85898A Other specified complication of other internal prosthetic devices, implants and grafts, initial encounter: Secondary | ICD-10-CM | POA: Diagnosis not present

## 2016-10-06 DIAGNOSIS — Z992 Dependence on renal dialysis: Secondary | ICD-10-CM | POA: Diagnosis not present

## 2016-10-07 DIAGNOSIS — Z23 Encounter for immunization: Secondary | ICD-10-CM | POA: Diagnosis not present

## 2016-10-07 DIAGNOSIS — N186 End stage renal disease: Secondary | ICD-10-CM | POA: Diagnosis not present

## 2016-10-07 DIAGNOSIS — T85898A Other specified complication of other internal prosthetic devices, implants and grafts, initial encounter: Secondary | ICD-10-CM | POA: Diagnosis not present

## 2016-10-07 DIAGNOSIS — Z992 Dependence on renal dialysis: Secondary | ICD-10-CM | POA: Diagnosis not present

## 2016-10-07 DIAGNOSIS — T85848A Pain due to other internal prosthetic devices, implants and grafts, initial encounter: Secondary | ICD-10-CM | POA: Diagnosis not present

## 2016-10-08 ENCOUNTER — Other Ambulatory Visit: Payer: Self-pay | Admitting: Nephrology

## 2016-10-08 ENCOUNTER — Ambulatory Visit
Admission: RE | Admit: 2016-10-08 | Discharge: 2016-10-08 | Disposition: A | Discharge status: Home / Self Care | Payer: Commercial Managed Care - HMO | Source: Ambulatory Visit | Attending: Nephrology | Admitting: Nephrology

## 2016-10-08 DIAGNOSIS — Z23 Encounter for immunization: Secondary | ICD-10-CM | POA: Diagnosis not present

## 2016-10-08 DIAGNOSIS — T859XXA Unspecified complication of internal prosthetic device, implant and graft, initial encounter: Secondary | ICD-10-CM

## 2016-10-08 DIAGNOSIS — T85898A Other specified complication of other internal prosthetic devices, implants and grafts, initial encounter: Secondary | ICD-10-CM | POA: Diagnosis not present

## 2016-10-08 DIAGNOSIS — X58XXXA Exposure to other specified factors, initial encounter: Secondary | ICD-10-CM | POA: Diagnosis not present

## 2016-10-08 DIAGNOSIS — Z992 Dependence on renal dialysis: Secondary | ICD-10-CM | POA: Diagnosis not present

## 2016-10-08 DIAGNOSIS — Z4682 Encounter for fitting and adjustment of non-vascular catheter: Secondary | ICD-10-CM

## 2016-10-08 DIAGNOSIS — N186 End stage renal disease: Secondary | ICD-10-CM | POA: Diagnosis not present

## 2016-10-08 DIAGNOSIS — N289 Disorder of kidney and ureter, unspecified: Secondary | ICD-10-CM | POA: Diagnosis present

## 2016-10-08 DIAGNOSIS — Z452 Encounter for adjustment and management of vascular access device: Secondary | ICD-10-CM | POA: Diagnosis not present

## 2016-10-08 DIAGNOSIS — T85848A Pain due to other internal prosthetic devices, implants and grafts, initial encounter: Secondary | ICD-10-CM | POA: Diagnosis not present

## 2016-10-09 DIAGNOSIS — N186 End stage renal disease: Secondary | ICD-10-CM | POA: Diagnosis not present

## 2016-10-09 DIAGNOSIS — Z23 Encounter for immunization: Secondary | ICD-10-CM | POA: Diagnosis not present

## 2016-10-09 DIAGNOSIS — Z992 Dependence on renal dialysis: Secondary | ICD-10-CM | POA: Diagnosis not present

## 2016-10-09 DIAGNOSIS — T85898A Other specified complication of other internal prosthetic devices, implants and grafts, initial encounter: Secondary | ICD-10-CM | POA: Diagnosis not present

## 2016-10-09 DIAGNOSIS — T85848A Pain due to other internal prosthetic devices, implants and grafts, initial encounter: Secondary | ICD-10-CM | POA: Diagnosis not present

## 2016-10-10 ENCOUNTER — Encounter (INDEPENDENT_AMBULATORY_CARE_PROVIDER_SITE_OTHER): Payer: Self-pay | Admitting: Vascular Surgery

## 2016-10-10 ENCOUNTER — Ambulatory Visit (INDEPENDENT_AMBULATORY_CARE_PROVIDER_SITE_OTHER): Payer: Commercial Managed Care - HMO | Admitting: Vascular Surgery

## 2016-10-10 ENCOUNTER — Other Ambulatory Visit (INDEPENDENT_AMBULATORY_CARE_PROVIDER_SITE_OTHER): Payer: Self-pay | Admitting: Vascular Surgery

## 2016-10-10 VITALS — BP 130/62 | HR 63 | Resp 17 | Wt 248.0 lb

## 2016-10-10 DIAGNOSIS — E1121 Type 2 diabetes mellitus with diabetic nephropathy: Secondary | ICD-10-CM

## 2016-10-10 DIAGNOSIS — T85848A Pain due to other internal prosthetic devices, implants and grafts, initial encounter: Secondary | ICD-10-CM | POA: Diagnosis not present

## 2016-10-10 DIAGNOSIS — E785 Hyperlipidemia, unspecified: Secondary | ICD-10-CM | POA: Diagnosis not present

## 2016-10-10 DIAGNOSIS — T829XXS Unspecified complication of cardiac and vascular prosthetic device, implant and graft, sequela: Secondary | ICD-10-CM | POA: Diagnosis not present

## 2016-10-10 DIAGNOSIS — T85898A Other specified complication of other internal prosthetic devices, implants and grafts, initial encounter: Secondary | ICD-10-CM | POA: Diagnosis not present

## 2016-10-10 DIAGNOSIS — Z992 Dependence on renal dialysis: Secondary | ICD-10-CM

## 2016-10-10 DIAGNOSIS — N186 End stage renal disease: Secondary | ICD-10-CM | POA: Diagnosis not present

## 2016-10-10 DIAGNOSIS — Z23 Encounter for immunization: Secondary | ICD-10-CM | POA: Diagnosis not present
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
## 2016-10-11 ENCOUNTER — Encounter: Payer: Self-pay | Admitting: *Deleted

## 2016-10-11 DIAGNOSIS — N186 End stage renal disease: Secondary | ICD-10-CM | POA: Diagnosis not present

## 2016-10-11 DIAGNOSIS — Z0183 Encounter for blood typing: Secondary | ICD-10-CM | POA: Diagnosis not present

## 2016-10-11 DIAGNOSIS — Z992 Dependence on renal dialysis: Secondary | ICD-10-CM | POA: Diagnosis not present

## 2016-10-11 DIAGNOSIS — Z01812 Encounter for preprocedural laboratory examination: Secondary | ICD-10-CM | POA: Diagnosis not present

## 2016-10-11 DIAGNOSIS — T85898A Other specified complication of other internal prosthetic devices, implants and grafts, initial encounter: Secondary | ICD-10-CM | POA: Diagnosis not present

## 2016-10-11 DIAGNOSIS — Z23 Encounter for immunization: Secondary | ICD-10-CM | POA: Diagnosis not present

## 2016-10-11 DIAGNOSIS — T85848A Pain due to other internal prosthetic devices, implants and grafts, initial encounter: Secondary | ICD-10-CM | POA: Diagnosis not present

## 2016-10-11 LAB — CBC WITH DIFFERENTIAL/PLATELET
BASOS ABS: 0 10*3/uL (ref 0–0.1)
BASOS PCT: 0 %
Eosinophils Absolute: 0.1 10*3/uL (ref 0–0.7)
Eosinophils Relative: 2 %
HEMATOCRIT: 32.8 % — AB (ref 40.0–52.0)
HEMOGLOBIN: 10.8 g/dL — AB (ref 13.0–18.0)
LYMPHS PCT: 15 %
Lymphs Abs: 1 10*3/uL (ref 1.0–3.6)
MCH: 30.3 pg (ref 26.0–34.0)
MCHC: 33 g/dL (ref 32.0–36.0)
MCV: 91.6 fL (ref 80.0–100.0)
Monocytes Absolute: 0.7 10*3/uL (ref 0.2–1.0)
Monocytes Relative: 10 %
NEUTROS PCT: 73 %
Neutro Abs: 5 10*3/uL (ref 1.4–6.5)
Platelets: 185 10*3/uL (ref 150–440)
RBC: 3.58 MIL/uL — AB (ref 4.40–5.90)
RDW: 18.6 % — ABNORMAL HIGH (ref 11.5–14.5)
WBC: 6.9 10*3/uL (ref 3.8–10.6)

## 2016-10-11 LAB — BASIC METABOLIC PANEL
ANION GAP: 9 (ref 5–15)
BUN: 72 mg/dL — ABNORMAL HIGH (ref 6–20)
CALCIUM: 8.7 mg/dL — AB (ref 8.9–10.3)
CO2: 24 mmol/L (ref 22–32)
Chloride: 104 mmol/L (ref 101–111)
Creatinine, Ser: 5.43 mg/dL — ABNORMAL HIGH (ref 0.61–1.24)
GFR, EST AFRICAN AMERICAN: 12 mL/min — AB (ref >60)
GFR, EST NON AFRICAN AMERICAN: 10 mL/min — AB (ref >60)
Glucose, Bld: 145 mg/dL — ABNORMAL HIGH (ref 65–99)
POTASSIUM: 3.6 mmol/L (ref 3.5–5.1)
Sodium: 137 mmol/L (ref 135–145)

## 2016-10-11 LAB — PROTIME-INR
INR: 1.05
Prothrombin Time: 13.7 seconds (ref 11.4–15.2)

## 2016-10-11 LAB — TYPE AND SCREEN
ABO/RH(D): O POS
ANTIBODY SCREEN: NEGATIVE

## 2016-10-11 LAB — APTT: APTT: 32 s (ref 24–36)

## 2016-10-11 NOTE — Patient Instructions (Signed)
  Your procedure is scheduled on: Wed.10/17/16 Report to Day Surgery. To find out your arrival time please call [phone] between 1PM - 3PM on Tues 10/16/16.  Remember: Instructions that are not followed completely may result in serious medical risk, up to and including death, or upon the discretion of your surgeon and anesthesiologist your surgery may need to be rescheduled.    __x__ 1. Do not eat food or drink liquids after midnight. No gum chewing or hard candies.     __x__ 2. No Alcohol for 24 hours before or after surgery.   ____ 3. Do Not Smoke For 24 Hours Prior to Your Surgery.   ____ 4. Bring all medications with you on the day of surgery if instructed.    __x__ 5. Notify your doctor if there is any change in your medical condition     (cold, fever, infections).       Do not wear jewelry, make-up, hairpins, clips or nail polish.  Do not wear lotions, powders, or perfumes. You may wear deodorant.  Do not shave 48 hours prior to surgery. Men may shave face and neck.  Do not bring valuables to the hospital.    [facility] is not responsible for any belongings or valuables.               Contacts, dentures or bridgework may not be worn into surgery.  Leave your suitcase in the car. After surgery it may be brought to your room.  For patients admitted to the hospital, discharge time is determined by your                treatment team.   Patients discharged the day of surgery will not be allowed to drive home.   Please read over the following fact sheets that you were given:      _x___ Take these medicines the morning of surgery with A SIP OF WATER:    1. allopurinol (ZYLOPRIM) 300 MG tablet  2. amLODipine (NORVASC) 10 MG tablet  3. atorvastatin (LIPITOR) 20 MG tablet  4.citalopram (CELEXA) 20 MG tablet  5.hydrALAZINE (APRESOLINE) 50 MG tablet  6.losartan (COZAAR) 50 MG tablet             7.metoprolol succinate (TOPROL-XL) 25 MG 24 hr tablet  ____ Fleet Enema (as  directed)   __x__ Use CHG Soap as directed  ____ Use inhalers on the day of surgery  ____ Stop metformin 2 days prior to surgery    ____ Take 1/2 of usual insulin dose the night before surgery and none on the morning of surgery.   ____ Stop Coumadin/Plavix/aspirin on   ____ Stop Anti-inflammatories on    ____ Stop supplements until after surgery.    ____ Bring C-Pap to the hospital.

## 2016-10-12 ENCOUNTER — Encounter
Admission: RE | Admit: 2016-10-12 | Discharge: 2016-10-12 | Disposition: A | Discharge status: Home / Self Care | Payer: Commercial Managed Care - HMO | Source: Ambulatory Visit | Attending: Vascular Surgery | Admitting: Vascular Surgery

## 2016-10-12 DIAGNOSIS — Z0183 Encounter for blood typing: Secondary | ICD-10-CM | POA: Diagnosis not present

## 2016-10-12 DIAGNOSIS — T85898A Other specified complication of other internal prosthetic devices, implants and grafts, initial encounter: Secondary | ICD-10-CM | POA: Diagnosis not present

## 2016-10-12 DIAGNOSIS — Z23 Encounter for immunization: Secondary | ICD-10-CM | POA: Diagnosis not present

## 2016-10-12 DIAGNOSIS — Z01812 Encounter for preprocedural laboratory examination: Secondary | ICD-10-CM | POA: Diagnosis not present

## 2016-10-12 DIAGNOSIS — N186 End stage renal disease: Secondary | ICD-10-CM | POA: Insufficient documentation

## 2016-10-12 DIAGNOSIS — Z992 Dependence on renal dialysis: Secondary | ICD-10-CM | POA: Diagnosis not present

## 2016-10-12 DIAGNOSIS — T85848A Pain due to other internal prosthetic devices, implants and grafts, initial encounter: Secondary | ICD-10-CM | POA: Diagnosis not present

## 2016-10-12 HISTORY — DX: Personal history of Methicillin resistant Staphylococcus aureus infection: Z86.14

## 2016-10-12 LAB — SURGICAL PCR SCREEN
MRSA, PCR: NEGATIVE
Staphylococcus aureus: NEGATIVE

## 2016-10-13 DIAGNOSIS — T85898A Other specified complication of other internal prosthetic devices, implants and grafts, initial encounter: Secondary | ICD-10-CM | POA: Diagnosis not present

## 2016-10-13 DIAGNOSIS — T85848A Pain due to other internal prosthetic devices, implants and grafts, initial encounter: Secondary | ICD-10-CM | POA: Diagnosis not present

## 2016-10-13 DIAGNOSIS — Z23 Encounter for immunization: Secondary | ICD-10-CM | POA: Diagnosis not present

## 2016-10-13 DIAGNOSIS — Z992 Dependence on renal dialysis: Secondary | ICD-10-CM | POA: Diagnosis not present

## 2016-10-13 DIAGNOSIS — N186 End stage renal disease: Secondary | ICD-10-CM | POA: Diagnosis not present

## 2016-10-14 DIAGNOSIS — N186 End stage renal disease: Secondary | ICD-10-CM | POA: Diagnosis not present

## 2016-10-14 DIAGNOSIS — T85848A Pain due to other internal prosthetic devices, implants and grafts, initial encounter: Secondary | ICD-10-CM | POA: Diagnosis not present

## 2016-10-14 DIAGNOSIS — Z23 Encounter for immunization: Secondary | ICD-10-CM | POA: Diagnosis not present

## 2016-10-14 DIAGNOSIS — T85898A Other specified complication of other internal prosthetic devices, implants and grafts, initial encounter: Secondary | ICD-10-CM | POA: Diagnosis not present

## 2016-10-14 DIAGNOSIS — Z992 Dependence on renal dialysis: Secondary | ICD-10-CM | POA: Diagnosis not present

## 2016-10-15 DIAGNOSIS — T85848A Pain due to other internal prosthetic devices, implants and grafts, initial encounter: Secondary | ICD-10-CM | POA: Diagnosis not present

## 2016-10-15 DIAGNOSIS — Z23 Encounter for immunization: Secondary | ICD-10-CM | POA: Diagnosis not present

## 2016-10-15 DIAGNOSIS — T85898A Other specified complication of other internal prosthetic devices, implants and grafts, initial encounter: Secondary | ICD-10-CM | POA: Diagnosis not present

## 2016-10-15 DIAGNOSIS — N186 End stage renal disease: Secondary | ICD-10-CM | POA: Diagnosis not present

## 2016-10-15 DIAGNOSIS — Z992 Dependence on renal dialysis: Secondary | ICD-10-CM | POA: Diagnosis not present

## 2016-10-16 DIAGNOSIS — N186 End stage renal disease: Secondary | ICD-10-CM | POA: Diagnosis not present

## 2016-10-16 DIAGNOSIS — T85898A Other specified complication of other internal prosthetic devices, implants and grafts, initial encounter: Secondary | ICD-10-CM | POA: Diagnosis not present

## 2016-10-16 DIAGNOSIS — Z992 Dependence on renal dialysis: Secondary | ICD-10-CM | POA: Diagnosis not present

## 2016-10-16 DIAGNOSIS — Z23 Encounter for immunization: Secondary | ICD-10-CM | POA: Diagnosis not present

## 2016-10-16 DIAGNOSIS — T85848A Pain due to other internal prosthetic devices, implants and grafts, initial encounter: Secondary | ICD-10-CM | POA: Diagnosis not present

## 2016-10-16 MED ORDER — CEFAZOLIN SODIUM-DEXTROSE 2-4 GM/100ML-% IV SOLN
2.0000 g | INTRAVENOUS | Status: AC
  Administered 2016-10-17: 2 g via INTRAVENOUS

## 2016-10-17 ENCOUNTER — Encounter
Admission: RE | Disposition: A | Discharge status: Home / Self Care | Payer: Self-pay | Source: Ambulatory Visit | Attending: Vascular Surgery

## 2016-10-17 ENCOUNTER — Ambulatory Visit: Payer: Commercial Managed Care - HMO | Admitting: Anesthesiology

## 2016-10-17 ENCOUNTER — Ambulatory Visit
Admission: RE | Admit: 2016-10-17 | Discharge: 2016-10-17 | Disposition: A | Discharge status: Home / Self Care | Payer: Commercial Managed Care - HMO | Source: Ambulatory Visit | Attending: Vascular Surgery | Admitting: Vascular Surgery

## 2016-10-17 DIAGNOSIS — M109 Gout, unspecified: Secondary | ICD-10-CM | POA: Insufficient documentation

## 2016-10-17 DIAGNOSIS — I509 Heart failure, unspecified: Secondary | ICD-10-CM | POA: Diagnosis not present

## 2016-10-17 DIAGNOSIS — Z91013 Allergy to seafood: Secondary | ICD-10-CM | POA: Diagnosis not present

## 2016-10-17 DIAGNOSIS — T85611A Breakdown (mechanical) of intraperitoneal dialysis catheter, initial encounter: Secondary | ICD-10-CM | POA: Insufficient documentation

## 2016-10-17 DIAGNOSIS — R51 Headache: Secondary | ICD-10-CM | POA: Diagnosis not present

## 2016-10-17 DIAGNOSIS — Z888 Allergy status to other drugs, medicaments and biological substances status: Secondary | ICD-10-CM | POA: Insufficient documentation

## 2016-10-17 DIAGNOSIS — N186 End stage renal disease: Secondary | ICD-10-CM | POA: Insufficient documentation

## 2016-10-17 DIAGNOSIS — I132 Hypertensive heart and chronic kidney disease with heart failure and with stage 5 chronic kidney disease, or end stage renal disease: Secondary | ICD-10-CM | POA: Diagnosis not present

## 2016-10-17 DIAGNOSIS — Z79899 Other long term (current) drug therapy: Secondary | ICD-10-CM | POA: Diagnosis not present

## 2016-10-17 DIAGNOSIS — Z91041 Radiographic dye allergy status: Secondary | ICD-10-CM | POA: Diagnosis not present

## 2016-10-17 DIAGNOSIS — G43909 Migraine, unspecified, not intractable, without status migrainosus: Secondary | ICD-10-CM | POA: Insufficient documentation

## 2016-10-17 DIAGNOSIS — I10 Essential (primary) hypertension: Secondary | ICD-10-CM | POA: Diagnosis not present

## 2016-10-17 DIAGNOSIS — Z23 Encounter for immunization: Secondary | ICD-10-CM | POA: Diagnosis not present

## 2016-10-17 DIAGNOSIS — Z992 Dependence on renal dialysis: Secondary | ICD-10-CM | POA: Insufficient documentation

## 2016-10-17 DIAGNOSIS — X58XXXA Exposure to other specified factors, initial encounter: Secondary | ICD-10-CM | POA: Insufficient documentation

## 2016-10-17 DIAGNOSIS — D631 Anemia in chronic kidney disease: Secondary | ICD-10-CM | POA: Diagnosis not present

## 2016-10-17 DIAGNOSIS — E7800 Pure hypercholesterolemia, unspecified: Secondary | ICD-10-CM | POA: Diagnosis not present

## 2016-10-17 DIAGNOSIS — D649 Anemia, unspecified: Secondary | ICD-10-CM | POA: Diagnosis not present

## 2016-10-17 DIAGNOSIS — F419 Anxiety disorder, unspecified: Secondary | ICD-10-CM | POA: Insufficient documentation

## 2016-10-17 DIAGNOSIS — E785 Hyperlipidemia, unspecified: Secondary | ICD-10-CM | POA: Insufficient documentation

## 2016-10-17 DIAGNOSIS — E1122 Type 2 diabetes mellitus with diabetic chronic kidney disease: Secondary | ICD-10-CM | POA: Diagnosis not present

## 2016-10-17 DIAGNOSIS — Z886 Allergy status to analgesic agent status: Secondary | ICD-10-CM | POA: Insufficient documentation

## 2016-10-17 DIAGNOSIS — T85898A Other specified complication of other internal prosthetic devices, implants and grafts, initial encounter: Secondary | ICD-10-CM | POA: Diagnosis not present

## 2016-10-17 DIAGNOSIS — R0602 Shortness of breath: Secondary | ICD-10-CM | POA: Diagnosis not present

## 2016-10-17 DIAGNOSIS — Z01810 Encounter for preprocedural cardiovascular examination: Secondary | ICD-10-CM | POA: Diagnosis not present

## 2016-10-17 DIAGNOSIS — T85848A Pain due to other internal prosthetic devices, implants and grafts, initial encounter: Secondary | ICD-10-CM | POA: Diagnosis not present

## 2016-10-17 DIAGNOSIS — I1311 Hypertensive heart and chronic kidney disease without heart failure, with stage 5 chronic kidney disease, or end stage renal disease: Secondary | ICD-10-CM | POA: Diagnosis not present

## 2016-10-17 DIAGNOSIS — F329 Major depressive disorder, single episode, unspecified: Secondary | ICD-10-CM | POA: Insufficient documentation

## 2016-10-17 HISTORY — DX: Other complications of anesthesia, initial encounter: T88.59XA

## 2016-10-17 HISTORY — PX: CAPD INSERTION: SHX5233

## 2016-10-17 HISTORY — DX: Adverse effect of unspecified anesthetic, initial encounter: T41.45XA

## 2016-10-17 LAB — GLUCOSE, CAPILLARY
GLUCOSE-CAPILLARY: 127 mg/dL — AB (ref 65–99)
Glucose-Capillary: 106 mg/dL — ABNORMAL HIGH (ref 65–99)

## 2016-10-17 LAB — POCT I-STAT 4, (NA,K, GLUC, HGB,HCT)
GLUCOSE: 105 mg/dL — AB (ref 65–99)
HEMATOCRIT: 32 % — AB (ref 39.0–52.0)
HEMOGLOBIN: 10.9 g/dL — AB (ref 13.0–17.0)
POTASSIUM: 3.7 mmol/L (ref 3.5–5.1)
SODIUM: 139 mmol/L (ref 135–145)

## 2016-10-17 SURGERY — LAPAROSCOPIC INSERTION CONTINUOUS AMBULATORY PERITONEAL DIALYSIS  (CAPD) CATHETER
Anesthesia: General | Site: Abdomen | Wound class: Clean

## 2016-10-17 MED ORDER — PROPOFOL 10 MG/ML IV BOLUS
INTRAVENOUS | Status: AC
  Filled 2016-10-17: qty 20

## 2016-10-17 MED ORDER — ONDANSETRON HCL 4 MG/2ML IJ SOLN
4.0000 mg | Freq: Once | INTRAMUSCULAR | Status: AC | PRN
  Administered 2016-10-17: 4 mg via INTRAVENOUS

## 2016-10-17 MED ORDER — PHENYLEPHRINE 40 MCG/ML (10ML) SYRINGE FOR IV PUSH (FOR BLOOD PRESSURE SUPPORT)
PREFILLED_SYRINGE | INTRAVENOUS | Status: AC
  Filled 2016-10-17: qty 10

## 2016-10-17 MED ORDER — GLYCOPYRROLATE 0.2 MG/ML IJ SOLN
INTRAMUSCULAR | Status: DC | PRN
  Administered 2016-10-17: 0.6 mg via INTRAVENOUS

## 2016-10-17 MED ORDER — ROCURONIUM BROMIDE 50 MG/5ML IV SOSY
PREFILLED_SYRINGE | INTRAVENOUS | Status: AC
  Filled 2016-10-17: qty 5

## 2016-10-17 MED ORDER — FAMOTIDINE 20 MG PO TABS
20.0000 mg | ORAL_TABLET | Freq: Once | ORAL | Status: AC
  Administered 2016-10-17: 20 mg via ORAL

## 2016-10-17 MED ORDER — FENTANYL CITRATE (PF) 100 MCG/2ML IJ SOLN
INTRAMUSCULAR | Status: AC
  Filled 2016-10-17: qty 2

## 2016-10-17 MED ORDER — FENTANYL CITRATE (PF) 100 MCG/2ML IJ SOLN
INTRAMUSCULAR | Status: AC
  Administered 2016-10-17: 25 ug via INTRAVENOUS
  Filled 2016-10-17: qty 2

## 2016-10-17 MED ORDER — CHLORHEXIDINE GLUCONATE CLOTH 2 % EX PADS: 6.0000 | MEDICATED_PAD | Freq: Once | CUTANEOUS | Status: DC

## 2016-10-17 MED ORDER — ONDANSETRON HCL 4 MG/2ML IJ SOLN
INTRAMUSCULAR | Status: DC | PRN
  Administered 2016-10-17: 4 mg via INTRAVENOUS

## 2016-10-17 MED ORDER — LIDOCAINE HCL (CARDIAC) 20 MG/ML IV SOLN
INTRAVENOUS | Status: DC | PRN
  Administered 2016-10-17: 60 mg via INTRAVENOUS

## 2016-10-17 MED ORDER — GLYCOPYRROLATE 0.2 MG/ML IJ SOLN
INTRAMUSCULAR | Status: AC
  Filled 2016-10-17: qty 3

## 2016-10-17 MED ORDER — FENTANYL CITRATE (PF) 100 MCG/2ML IJ SOLN
25.0000 ug | INTRAMUSCULAR | Status: DC | PRN
  Administered 2016-10-17 (×4): 25 ug via INTRAVENOUS

## 2016-10-17 MED ORDER — HYDROCODONE-ACETAMINOPHEN 5-325 MG PO TABS
1.0000 | ORAL_TABLET | Freq: Four times a day (QID) | ORAL | Status: DC | PRN
  Administered 2016-10-17: 1 via ORAL

## 2016-10-17 MED ORDER — ONDANSETRON HCL 4 MG/2ML IJ SOLN
INTRAMUSCULAR | Status: AC
  Administered 2016-10-17: 4 mg via INTRAVENOUS
  Filled 2016-10-17: qty 2

## 2016-10-17 MED ORDER — SUCCINYLCHOLINE CHLORIDE 200 MG/10ML IV SOSY
PREFILLED_SYRINGE | INTRAVENOUS | Status: AC
  Filled 2016-10-17: qty 10

## 2016-10-17 MED ORDER — ONDANSETRON HCL 4 MG/2ML IJ SOLN
INTRAMUSCULAR | Status: AC
  Filled 2016-10-17: qty 2

## 2016-10-17 MED ORDER — ROCURONIUM BROMIDE 100 MG/10ML IV SOLN
INTRAVENOUS | Status: DC | PRN
  Administered 2016-10-17 (×2): 10 mg via INTRAVENOUS

## 2016-10-17 MED ORDER — EPHEDRINE SULFATE 50 MG/ML IJ SOLN
INTRAMUSCULAR | Status: DC | PRN
  Administered 2016-10-17: 5 mg via INTRAVENOUS
  Administered 2016-10-17: 10 mg via INTRAVENOUS

## 2016-10-17 MED ORDER — SUCCINYLCHOLINE CHLORIDE 20 MG/ML IJ SOLN
INTRAMUSCULAR | Status: DC | PRN
  Administered 2016-10-17: 120 mg via INTRAVENOUS

## 2016-10-17 MED ORDER — NEOSTIGMINE METHYLSULFATE 10 MG/10ML IV SOLN
INTRAVENOUS | Status: DC | PRN
  Administered 2016-10-17: 3 mg via INTRAVENOUS

## 2016-10-17 MED ORDER — MIDAZOLAM HCL 2 MG/2ML IJ SOLN
INTRAMUSCULAR | Status: AC
  Filled 2016-10-17: qty 2

## 2016-10-17 MED ORDER — PROPOFOL 10 MG/ML IV BOLUS
INTRAVENOUS | Status: DC | PRN
  Administered 2016-10-17: 200 mg via INTRAVENOUS

## 2016-10-17 MED ORDER — SODIUM CHLORIDE 0.9 % IV SOLN
INTRAVENOUS | Status: DC
  Administered 2016-10-17: 14:00:00 via INTRAVENOUS

## 2016-10-17 MED ORDER — CHLORHEXIDINE GLUCONATE CLOTH 2 % EX PADS
6.0000 | MEDICATED_PAD | Freq: Once | CUTANEOUS | Status: AC
  Administered 2016-10-17: 6 via TOPICAL

## 2016-10-17 MED ORDER — LIDOCAINE 2% (20 MG/ML) 5 ML SYRINGE
INTRAMUSCULAR | Status: AC
  Filled 2016-10-17: qty 5

## 2016-10-17 MED ORDER — FAMOTIDINE 20 MG PO TABS
ORAL_TABLET | ORAL | Status: AC
  Filled 2016-10-17: qty 1

## 2016-10-17 MED ORDER — CEFAZOLIN SODIUM-DEXTROSE 2-4 GM/100ML-% IV SOLN
INTRAVENOUS | Status: AC
  Filled 2016-10-17: qty 100

## 2016-10-17 MED ORDER — HYDROCODONE-ACETAMINOPHEN 5-325 MG PO TABS: 1.0000 | ORAL_TABLET | Freq: Four times a day (QID) | ORAL | 0 refills | Status: DC | PRN

## 2016-10-17 MED ORDER — MIDAZOLAM HCL 5 MG/5ML IJ SOLN
INTRAMUSCULAR | Status: DC | PRN
  Administered 2016-10-17: 2 mg via INTRAVENOUS

## 2016-10-17 MED ORDER — HYDROCODONE-ACETAMINOPHEN 5-325 MG PO TABS
ORAL_TABLET | ORAL | Status: AC
  Filled 2016-10-17: qty 1

## 2016-10-17 MED ORDER — NEOSTIGMINE METHYLSULFATE 5 MG/5ML IV SOSY
PREFILLED_SYRINGE | INTRAVENOUS | Status: AC
  Filled 2016-10-17: qty 5

## 2016-10-17 MED ORDER — FENTANYL CITRATE (PF) 100 MCG/2ML IJ SOLN
INTRAMUSCULAR | Status: DC | PRN
  Administered 2016-10-17: 50 ug via INTRAVENOUS

## 2016-10-17 SURGICAL SUPPLY — 34 items
ADAPTER BETA CAP QUINTON DIALY (ADAPTER) ×2 IMPLANT
ADAPTER CATH DIALYSIS 18.75 (CATHETERS) ×2 IMPLANT
CANISTER SUCT 1200ML W/VALVE (MISCELLANEOUS) ×2 IMPLANT
CATH DLYS SWAN NECK 62.5CM (CATHETERS) ×4 IMPLANT
CHLORAPREP W/TINT 26ML (MISCELLANEOUS) ×2 IMPLANT
DERMABOND ADVANCED (GAUZE/BANDAGES/DRESSINGS) ×1
DERMABOND ADVANCED .7 DNX12 (GAUZE/BANDAGES/DRESSINGS) ×1 IMPLANT
DRSG TEGADERM 2-3/8X2-3/4 SM (GAUZE/BANDAGES/DRESSINGS) ×4 IMPLANT
ELECT CAUTERY BLADE 6.4 (BLADE) ×2 IMPLANT
ELECT REM PT RETURN 9FT ADLT (ELECTROSURGICAL) ×2
ELECTRODE REM PT RTRN 9FT ADLT (ELECTROSURGICAL) ×1 IMPLANT
GAUZE SPONGE NON-WVN 2X2 STRL (MISCELLANEOUS) ×2 IMPLANT
GLOVE BIO SURGEON STRL SZ7 (GLOVE) ×4 IMPLANT
GLOVE INDICATOR 7.5 STRL GRN (GLOVE) ×2 IMPLANT
GOWN STRL REUS W/ TWL LRG LVL3 (GOWN DISPOSABLE) ×2 IMPLANT
GOWN STRL REUS W/ TWL XL LVL3 (GOWN DISPOSABLE) ×1 IMPLANT
GOWN STRL REUS W/TWL LRG LVL3 (GOWN DISPOSABLE) ×2
GOWN STRL REUS W/TWL XL LVL3 (GOWN DISPOSABLE) ×1
IV NS 500ML (IV SOLUTION) ×1
IV NS 500ML BAXH (IV SOLUTION) ×1 IMPLANT
KIT RM TURNOVER STRD PROC AR (KITS) ×2 IMPLANT
LABEL OR SOLS (LABEL) ×2 IMPLANT
MINICAP W/POVIDONE IODINE SOL (MISCELLANEOUS) ×2 IMPLANT
PACK LAP CHOLECYSTECTOMY (MISCELLANEOUS) ×2 IMPLANT
PENCIL ELECTRO HAND CTR (MISCELLANEOUS) ×2 IMPLANT
SET CYSTO W/LG BORE CLAMP LF (SET/KITS/TRAYS/PACK) ×2 IMPLANT
SET TRANSFER 6 W/TWIST CLAMP 5 (SET/KITS/TRAYS/PACK) ×2 IMPLANT
SPONGE VERSALON 2X2 STRL (MISCELLANEOUS) ×2
SPONGE VERSALON 4X4 4PLY (MISCELLANEOUS) ×2 IMPLANT
SUT MNCRL AB 4-0 PS2 18 (SUTURE) ×2 IMPLANT
SUT VIC AB 2-0 UR6 27 (SUTURE) ×2 IMPLANT
SUT VICRYL+ 3-0 36IN CT-1 (SUTURE) ×2 IMPLANT
TROCAR XCEL NON-BLD 11X100MML (ENDOMECHANICALS) ×2 IMPLANT
TUBING INSUFFLATOR HI FLOW (MISCELLANEOUS) ×2 IMPLANT

## 2016-10-17 NOTE — Anesthesia Postprocedure Evaluation (Signed)
Anesthesia Post Note  Patient: Keyden Pavlov Craker  Procedure(s) Performed: Procedure(s) (LRB): LAPAROSCOPIC INSERTION CONTINUOUS AMBULATORY PERITONEAL DIALYSIS  (CAPD) CATHETER ( REVISION ) (N/A)  Patient location during evaluation: PACU Anesthesia Type: General Level of consciousness: awake and alert Pain management: pain level controlled Vital Signs Assessment: post-procedure vital signs reviewed and stable Respiratory status: spontaneous breathing, nonlabored ventilation, respiratory function stable and patient connected to nasal cannula oxygen Cardiovascular status: blood pressure returned to baseline and stable Postop Assessment: no signs of nausea or vomiting Anesthetic complications: no     Last Vitals:  Vitals:   10/17/16 1848 10/17/16 1930  BP: 124/61 (!) 142/62  Pulse: 72 68  Resp: 12 12  Temp: 36.6 C     Last Pain:  Vitals:   10/17/16 1930  TempSrc:   PainSc: [place]

## 2016-10-17 NOTE — H&P (Signed)
[facility] History & Physical Update  The patient was interviewed and re-examined.  The patient's previous History and Physical has been reviewed and is unchanged.  The PD catheter is not in the pelvis and is not working well.  We will need to revise or replace this. We plan to proceed with the scheduled procedure.  Leotis Pain, MD  10/17/2016, 4:38 PM

## 2016-10-17 NOTE — Op Note (Signed)
OPERATIVE NOTE   PROCEDURE: 1. Removal of left-sided peritoneal dialysis catheter which was nonfunctional  2. Laparoscopic peritoneal dialysis catheter placement on the right side  PRE-OPERATIVE DIAGNOSIS: 1. End-stage renal disease 2. Nonfunctional peritoneal dialysis catheter status post previous revision  POST-OPERATIVE DIAGNOSIS: Same  SURGEON: Leotis Pain, MD  ASSISTANT(S): None  ANESTHESIA: general  ESTIMATED BLOOD LOSS: Minimal   FINDING(S): 1. None  SPECIMEN(S): None  INDICATIONS:  Patient presents with a nonfunctional left-sided peritoneal dialysis catheter. We have already revise this once. We have decided to remove this and place a new catheter. The patient has decided to continue to do peritoneal dialysis for his long-term dialysis. Risks and benefits of placement were discussed and he is agreeable to proceed.  Differences between peritoneal dialysis and hemodialysis were discussed.    DESCRIPTION: After obtaining full informed written consent, the patient was brought back to the operating room and placed supine upon the operating table. The patient received IV antibiotics prior to induction. After obtaining adequate anesthesia, the abdomen was prepped and draped in the standard fashion. I started by dissecting out the superficial cuff from the left-sided peritoneal dialysis catheter exit site. This was freed with hemostats from its surrounding fibrous attachments and superficial cuff was brought out of the skin. The catheter was then transected. I reopened the previous incision just to the left of the midline above the umbilicus and dissected out the catheter to its fascial attachment. The deep cuff was dissected out from the fascial attachment and the catheter was removed in its entirety that was remaining. A Vicryl UR 6 suture was then placed to close the fascial defect, but I placed the 11 mm Optiview trocar through this fascial opening and into the peritoneal  cavity. The peritoneal cavity was then insufflated with carbon dioxide. A small transverse incision was created just to the right of the midline a few centimeters below the umbilicus secondary to his previous umbilical hernia repair and we dissected down to the fascia and placed a pursestring Vicryl suture. I then entered the peritoneum just beside the midline below the umbilicus with a trocar and the peritoneal dialysis catheter under direct visualization. The coiled portion of the catheter was parked into the pelvis under direct laparoscopic guidance. The deep cuff was secured to the fascial pursestring suture. A small counterincision was made in the right abdomen and the catheter was brought out this site. The appropriate distal connectors were placed, and I then placed 500 cc of saline through the catheter into the pelvis. The abdomen was desufflated. Immediately, over 400 cc of effluent returned through the catheter when the bag was placed to gravity. This is in addition to at least 200 cc of effluent that was removed from the pelvis with the initial placement of the catheter that was remaining from his previous dialysis treatments. I took one more look with the camera to ensure that the catheter was in the pelvis and it was. The 69mm trocar was then removed. I then closed the incisions with 3-0 Vicryl and 4-0 Monocryl and placed Dermabond as dressing. Dry dressing was placed around the catheter exit site. A dry dressing was also placed at the previous catheter exit site in the left abdomen. The patient was then awakened from anesthesia and taken to the recovery room in stable condition having tolerated the procedure well.  COMPLICATIONS: None  CONDITION: None  Leotis Pain, MD 10/17/2016 5:58 PM   This note was created with Dragon Medical transcription system. Any errors in  dictation are purely unintentional.

## 2016-10-17 NOTE — Anesthesia Preprocedure Evaluation (Signed)
Anesthesia Evaluation  Patient identified by MRN, date of birth, ID band Patient awake    Reviewed: Allergy & Precautions, NPO status , Patient's Chart, lab work & pertinent test results, reviewed documented beta blocker date and time   Airway Mallampati: III  TM Distance: >3 FB     Dental  (+) Chipped   Pulmonary           Cardiovascular hypertension, Pt. on medications and Pt. on home beta blockers +CHF       Neuro/Psych  Headaches, PSYCHIATRIC DISORDERS Anxiety Depression    GI/Hepatic   Endo/Other  diabetes, Type 2  Renal/GU Renal disease     Musculoskeletal   Abdominal   Peds  Hematology  (+) anemia ,   Anesthesia Other Findings Gout.  Reproductive/Obstetrics                             Anesthesia Physical Anesthesia Plan  ASA: III  Anesthesia Plan: General   Post-op Pain Management:    Induction: Intravenous  Airway Management Planned: Oral ETT  Additional Equipment:   Intra-op Plan:   Post-operative Plan:   Informed Consent: I have reviewed the patients History and Physical, chart, labs and discussed the procedure including the risks, benefits and alternatives for the proposed anesthesia with the patient or authorized representative who has indicated his/her understanding and acceptance.     Plan Discussed with: CRNA  Anesthesia Plan Comments:         Anesthesia Quick Evaluation

## 2016-10-17 NOTE — Discharge Instructions (Signed)

## 2016-10-17 NOTE — Transfer of Care (Signed)
Immediate Anesthesia Transfer of Care Note  Patient: Richard Chen  Procedure(s) Performed: Procedure(s): LAPAROSCOPIC INSERTION CONTINUOUS AMBULATORY PERITONEAL DIALYSIS  (CAPD) CATHETER ( REVISION ) (N/A)  Patient Location: PACU  Anesthesia Type:General  Level of Consciousness: awake, alert  and oriented  Airway & Oxygen Therapy: Patient connected to face mask oxygen  Post-op Assessment: Post -op Vital signs reviewed and stable  Post vital signs: stable  Last Vitals:  Vitals:   10/17/16 1404 10/17/16 1759  BP: 126/63 136/64  Pulse: 61 85  Resp: 16   Temp: 36.8 C     Last Pain:  Vitals:   10/17/16 1404  TempSrc: Temporal         Complications: No apparent anesthesia complications

## 2016-10-18 ENCOUNTER — Encounter: Payer: Self-pay | Admitting: Vascular Surgery

## 2016-10-18 DIAGNOSIS — T85848A Pain due to other internal prosthetic devices, implants and grafts, initial encounter: Secondary | ICD-10-CM | POA: Diagnosis not present

## 2016-10-18 DIAGNOSIS — Z23 Encounter for immunization: Secondary | ICD-10-CM | POA: Diagnosis not present

## 2016-10-18 DIAGNOSIS — N186 End stage renal disease: Secondary | ICD-10-CM | POA: Diagnosis not present

## 2016-10-18 DIAGNOSIS — Z992 Dependence on renal dialysis: Secondary | ICD-10-CM | POA: Diagnosis not present

## 2016-10-18 DIAGNOSIS — T85898A Other specified complication of other internal prosthetic devices, implants and grafts, initial encounter: Secondary | ICD-10-CM | POA: Diagnosis not present

## 2016-10-19 DIAGNOSIS — T85848A Pain due to other internal prosthetic devices, implants and grafts, initial encounter: Secondary | ICD-10-CM | POA: Diagnosis not present

## 2016-10-19 DIAGNOSIS — N186 End stage renal disease: Secondary | ICD-10-CM | POA: Diagnosis not present

## 2016-10-19 DIAGNOSIS — Z23 Encounter for immunization: Secondary | ICD-10-CM | POA: Diagnosis not present

## 2016-10-19 DIAGNOSIS — T85898A Other specified complication of other internal prosthetic devices, implants and grafts, initial encounter: Secondary | ICD-10-CM | POA: Diagnosis not present

## 2016-10-19 DIAGNOSIS — Z992 Dependence on renal dialysis: Secondary | ICD-10-CM | POA: Diagnosis not present

## 2016-10-20 DIAGNOSIS — Z992 Dependence on renal dialysis: Secondary | ICD-10-CM | POA: Diagnosis not present

## 2016-10-20 DIAGNOSIS — T85848A Pain due to other internal prosthetic devices, implants and grafts, initial encounter: Secondary | ICD-10-CM | POA: Diagnosis not present

## 2016-10-20 DIAGNOSIS — T85898A Other specified complication of other internal prosthetic devices, implants and grafts, initial encounter: Secondary | ICD-10-CM | POA: Diagnosis not present

## 2016-10-20 DIAGNOSIS — N186 End stage renal disease: Secondary | ICD-10-CM | POA: Diagnosis not present

## 2016-10-20 DIAGNOSIS — Z23 Encounter for immunization: Secondary | ICD-10-CM | POA: Diagnosis not present

## 2016-10-21 DIAGNOSIS — N186 End stage renal disease: Secondary | ICD-10-CM | POA: Diagnosis not present

## 2016-10-21 DIAGNOSIS — T85898A Other specified complication of other internal prosthetic devices, implants and grafts, initial encounter: Secondary | ICD-10-CM | POA: Diagnosis not present

## 2016-10-21 DIAGNOSIS — Z992 Dependence on renal dialysis: Secondary | ICD-10-CM | POA: Diagnosis not present

## 2016-10-21 DIAGNOSIS — T85848A Pain due to other internal prosthetic devices, implants and grafts, initial encounter: Secondary | ICD-10-CM | POA: Diagnosis not present

## 2016-10-21 DIAGNOSIS — Z23 Encounter for immunization: Secondary | ICD-10-CM | POA: Diagnosis not present

## 2016-10-22 DIAGNOSIS — Z992 Dependence on renal dialysis: Secondary | ICD-10-CM | POA: Diagnosis not present

## 2016-10-22 DIAGNOSIS — N186 End stage renal disease: Secondary | ICD-10-CM | POA: Diagnosis not present

## 2016-10-22 DIAGNOSIS — Z23 Encounter for immunization: Secondary | ICD-10-CM | POA: Diagnosis not present

## 2016-10-23 DIAGNOSIS — Z23 Encounter for immunization: Secondary | ICD-10-CM | POA: Diagnosis not present

## 2016-10-23 DIAGNOSIS — N186 End stage renal disease: Secondary | ICD-10-CM | POA: Diagnosis not present

## 2016-10-23 DIAGNOSIS — Z992 Dependence on renal dialysis: Secondary | ICD-10-CM | POA: Diagnosis not present

## 2016-10-23 DIAGNOSIS — E119 Type 2 diabetes mellitus without complications: Secondary | ICD-10-CM | POA: Diagnosis not present

## 2016-10-24 DIAGNOSIS — Z23 Encounter for immunization: Secondary | ICD-10-CM | POA: Diagnosis not present

## 2016-10-24 DIAGNOSIS — Z992 Dependence on renal dialysis: Secondary | ICD-10-CM | POA: Diagnosis not present

## 2016-10-24 DIAGNOSIS — N186 End stage renal disease: Secondary | ICD-10-CM | POA: Diagnosis not present

## 2016-10-25 DIAGNOSIS — Z23 Encounter for immunization: Secondary | ICD-10-CM | POA: Diagnosis not present

## 2016-10-25 DIAGNOSIS — N186 End stage renal disease: Secondary | ICD-10-CM | POA: Diagnosis not present

## 2016-10-25 DIAGNOSIS — Z992 Dependence on renal dialysis: Secondary | ICD-10-CM | POA: Diagnosis not present

## 2016-10-26 ENCOUNTER — Encounter
Admission: RE | Disposition: A | Discharge status: Home / Self Care | Payer: Self-pay | Source: Ambulatory Visit | Attending: Vascular Surgery

## 2016-10-26 ENCOUNTER — Ambulatory Visit
Admission: RE | Admit: 2016-10-26 | Discharge: 2016-10-26 | Disposition: A | Discharge status: Home / Self Care | Payer: Medicare HMO | Source: Ambulatory Visit | Attending: Vascular Surgery | Admitting: Vascular Surgery

## 2016-10-26 DIAGNOSIS — D649 Anemia, unspecified: Secondary | ICD-10-CM | POA: Insufficient documentation

## 2016-10-26 DIAGNOSIS — Z841 Family history of disorders of kidney and ureter: Secondary | ICD-10-CM | POA: Diagnosis not present

## 2016-10-26 DIAGNOSIS — I509 Heart failure, unspecified: Secondary | ICD-10-CM | POA: Insufficient documentation

## 2016-10-26 DIAGNOSIS — Z992 Dependence on renal dialysis: Secondary | ICD-10-CM | POA: Insufficient documentation

## 2016-10-26 DIAGNOSIS — Z9889 Other specified postprocedural states: Secondary | ICD-10-CM | POA: Insufficient documentation

## 2016-10-26 DIAGNOSIS — Z91013 Allergy to seafood: Secondary | ICD-10-CM | POA: Diagnosis not present

## 2016-10-26 DIAGNOSIS — N186 End stage renal disease: Secondary | ICD-10-CM | POA: Insufficient documentation

## 2016-10-26 DIAGNOSIS — Z8042 Family history of malignant neoplasm of prostate: Secondary | ICD-10-CM | POA: Insufficient documentation

## 2016-10-26 DIAGNOSIS — Z91041 Radiographic dye allergy status: Secondary | ICD-10-CM | POA: Insufficient documentation

## 2016-10-26 DIAGNOSIS — Z8249 Family history of ischemic heart disease and other diseases of the circulatory system: Secondary | ICD-10-CM | POA: Diagnosis not present

## 2016-10-26 DIAGNOSIS — Z23 Encounter for immunization: Secondary | ICD-10-CM | POA: Diagnosis not present

## 2016-10-26 DIAGNOSIS — Z886 Allergy status to analgesic agent status: Secondary | ICD-10-CM | POA: Diagnosis not present

## 2016-10-26 DIAGNOSIS — E785 Hyperlipidemia, unspecified: Secondary | ICD-10-CM | POA: Diagnosis not present

## 2016-10-26 DIAGNOSIS — G43909 Migraine, unspecified, not intractable, without status migrainosus: Secondary | ICD-10-CM | POA: Diagnosis not present

## 2016-10-26 DIAGNOSIS — N529 Male erectile dysfunction, unspecified: Secondary | ICD-10-CM | POA: Insufficient documentation

## 2016-10-26 DIAGNOSIS — E1122 Type 2 diabetes mellitus with diabetic chronic kidney disease: Secondary | ICD-10-CM | POA: Diagnosis not present

## 2016-10-26 DIAGNOSIS — M109 Gout, unspecified: Secondary | ICD-10-CM | POA: Diagnosis not present

## 2016-10-26 DIAGNOSIS — Z9109 Other allergy status, other than to drugs and biological substances: Secondary | ICD-10-CM | POA: Insufficient documentation

## 2016-10-26 DIAGNOSIS — Z7984 Long term (current) use of oral hypoglycemic drugs: Secondary | ICD-10-CM | POA: Insufficient documentation

## 2016-10-26 DIAGNOSIS — I132 Hypertensive heart and chronic kidney disease with heart failure and with stage 5 chronic kidney disease, or end stage renal disease: Secondary | ICD-10-CM | POA: Insufficient documentation

## 2016-10-26 HISTORY — PX: PERIPHERAL VASCULAR CATHETERIZATION: SHX172C

## 2016-10-26 LAB — POTASSIUM (ARMC VASCULAR LAB ONLY): POTASSIUM (ARMC VASCULAR LAB): 3.7 (ref 3.5–5.1)

## 2016-10-26 SURGERY — DIALYSIS/PERMA CATHETER INSERTION
Anesthesia: Moderate Sedation

## 2016-10-26 MED ORDER — FAMOTIDINE IN NACL 20-0.9 MG/50ML-% IV SOLN
20.0000 mg | INTRAVENOUS | Status: AC
  Administered 2016-10-26: 20 mg via INTRAVENOUS

## 2016-10-26 MED ORDER — MIDAZOLAM HCL 2 MG/2ML IJ SOLN
INTRAMUSCULAR | Status: DC | PRN
  Administered 2016-10-26: 2 mg via INTRAVENOUS
  Administered 2016-10-26 (×2): 1 mg via INTRAVENOUS

## 2016-10-26 MED ORDER — HEPARIN (PORCINE) IN NACL 2-0.9 UNIT/ML-% IJ SOLN
INTRAMUSCULAR | Status: AC
  Filled 2016-10-26: qty 500

## 2016-10-26 MED ORDER — FENTANYL CITRATE (PF) 100 MCG/2ML IJ SOLN
INTRAMUSCULAR | Status: AC
  Filled 2016-10-26: qty 4

## 2016-10-26 MED ORDER — METHYLPREDNISOLONE SODIUM SUCC 125 MG IJ SOLR
125.0000 mg | INTRAMUSCULAR | Status: AC
  Administered 2016-10-26: 10:00:00 via INTRAVENOUS

## 2016-10-26 MED ORDER — HEPARIN SODIUM (PORCINE) 10000 UNIT/ML IJ SOLN
INTRAMUSCULAR | Status: AC
  Filled 2016-10-26: qty 1

## 2016-10-26 MED ORDER — CEFAZOLIN IN D5W 1 GM/50ML IV SOLN
1.0000 g | Freq: Once | INTRAVENOUS | Status: AC
  Administered 2016-10-26: 1 g via INTRAVENOUS

## 2016-10-26 MED ORDER — LIDOCAINE HCL (PF) 1 % IJ SOLN
INTRAMUSCULAR | Status: AC
  Filled 2016-10-26: qty 30

## 2016-10-26 MED ORDER — SODIUM CHLORIDE 0.9 % IV SOLN
INTRAVENOUS | Status: DC
  Administered 2016-10-26: 10:00:00 via INTRAVENOUS

## 2016-10-26 MED ORDER — MIDAZOLAM HCL 5 MG/5ML IJ SOLN
INTRAMUSCULAR | Status: AC
  Filled 2016-10-26: qty 5

## 2016-10-26 MED ORDER — HEPARIN SODIUM (PORCINE) 1000 UNIT/ML IJ SOLN
INTRAMUSCULAR | Status: AC
  Filled 2016-10-26: qty 1

## 2016-10-26 MED ORDER — METHYLPREDNISOLONE SODIUM SUCC 125 MG IJ SOLR
INTRAMUSCULAR | Status: AC
  Filled 2016-10-26: qty 2

## 2016-10-26 MED ORDER — FENTANYL CITRATE (PF) 100 MCG/2ML IJ SOLN
INTRAMUSCULAR | Status: DC | PRN
  Administered 2016-10-26: 25 ug via INTRAVENOUS
  Administered 2016-10-26 (×2): 50 ug via INTRAVENOUS

## 2016-10-26 MED ORDER — FAMOTIDINE 20 MG PO TABS
ORAL_TABLET | ORAL | Status: AC
  Filled 2016-10-26: qty 2

## 2016-10-26 SURGICAL SUPPLY — 9 items
BIOPATCH WHT 1IN DISK W/4.0 H (GAUZE/BANDAGES/DRESSINGS) ×2 IMPLANT
CATH PALINDROME RT-P 15FX19CM (CATHETERS) ×2 IMPLANT
DERMABOND ADVANCED (GAUZE/BANDAGES/DRESSINGS) ×1
DERMABOND ADVANCED .7 DNX12 (GAUZE/BANDAGES/DRESSINGS) ×1 IMPLANT
DRAPE INCISE IOBAN 66X45 STRL (DRAPES) ×2 IMPLANT
PACK ANGIOGRAPHY (CUSTOM PROCEDURE TRAY) ×2 IMPLANT
SUT MNCRL AB 4-0 PS2 18 (SUTURE) ×2 IMPLANT
SUT SILK 0 FSL (SUTURE) ×2 IMPLANT
TOWEL OR 17X26 4PK STRL BLUE (TOWEL DISPOSABLE) ×2 IMPLANT

## 2016-10-26 NOTE — Op Note (Signed)
[facility]   OPERATIVE NOTE     PROCEDURE: 1. Insertion right IJ palindrome tunneled dialysis catheter. 2. Catheter placement and cannulation under ultrasound and fluoroscopic guidance  PRE-OPERATIVE DIAGNOSIS: end-stage renal requiring hemodialysis  POST-OPERATIVE DIAGNOSIS: same as above  SURGEON: Katha Cabal, M.D.  ANESTHESIA: Conscious sedation was administered under my direct supervision. IV Versed plus fentanyl were utilized. Continuous ECG, pulse oximetry and blood pressure was monitored throughout the entire procedure.  Conscious sedation was for a total of 30 minutes.  ESTIMATED BLOOD LOSS: Minimal  FINDING(S): 1.  Tips of the catheter in the right atrium on fluoroscopy 2.  No obvious pneumothorax on fluoroscopy  SPECIMEN(S):  none  INDICATIONS:   Richard Chen is a 61 y.o. male  presents with end stage renal disease.  Therefore, the patient requires a tunneled dialysis catheter placement.  The patient is informed of  the risks catheter placement include but are not limited to: bleeding, infection, central venous injury, pneumothorax, possible venous stenosis, possible malpositioning in the venous system, and possible infections related to long-term catheter presence.  The patient was aware of these risks and agreed to proceed.  DESCRIPTION: The patient was taken back to Special Procedure suite.  Prior to sedation, the patient was given IV antibiotics.  After obtaining adequate sedation, the patient was prepped and draped in the standard fashion for a chest or neck tunneled dialysis catheter placement.  Appropriate Time Out is called.   The the neck and chest wall are then infiltrated with 1% Lidocaine with epinepherine.  A 19 cm tip to cuff palindrome catheter is then selected, opened on the back table and prepped. Under ultrasound guidance, the right internal jugular vein was cannulated with the Seldinger needle.  A J-wire was then advanced  under fluoroscopic guidance into the inferior vena cava and the wire was secured.  Small counter incision was then made at the wire insertion site. A small pocket was fashioned with blunt dissection to allow easier passage of the cuff.  The dilator and peel-away sheath are then advanced over the wire under fluoroscopic guidance. The catheters and advanced through the peel-away sheath after removal of the wire. It is approximated to the chest wall after verifying the tips at the atrial caval junction and an exit site is selected.  Small incision is made at the selected exit site and the tunneling device was passed subcutaneously to the neck counter incision. Catheter is then pulled through the subcutaneous tunnel. The catheter is then verified for tip position under fluoroscopy, transected and the hub assembly connected.    Each port was tested by aspirating and flushing.  No resistance was noted.  Each port was then thoroughly flushed with heparinized saline.  The catheter was secured in placed with two interrupted stitches of 0 silk tied to the catheter.  The neck incision was closed with a U-stitch of 4-0 Monocryl.  The neck and chest incision were cleaned and sterile bandages applied including a Biopatch.  Each port was then packed with concentrated heparin (1000 Units/mL) at the manufacturer recommended volumes to each port.  Sterile caps were applied to each port.  On completion fluoroscopy, the tips of the catheter were in the right atrium, and there was no evidence of pneumothorax.  COMPLICATIONS: None  CONDITION: Good   Katha Cabal M.D. [facility] Office: [phone]   10/26/2016, 11:51 AM

## 2016-10-26 NOTE — H&P (Signed)
[facility] History & Physical Update  The patient was interviewed and re-examined.  The patient's previous History and Physical has been reviewed and is unchanged.  There is no change in the plan of care. We plan to proceed with the scheduled procedure.  Hortencia Pilar, MD  10/26/2016, 9:50 AM

## 2016-10-26 NOTE — Discharge Instructions (Signed)

## 2016-10-27 DIAGNOSIS — Z992 Dependence on renal dialysis: Secondary | ICD-10-CM | POA: Diagnosis not present

## 2016-10-27 DIAGNOSIS — N186 End stage renal disease: Secondary | ICD-10-CM | POA: Diagnosis not present

## 2016-10-29 ENCOUNTER — Encounter: Payer: Self-pay | Admitting: Vascular Surgery

## 2016-10-30 DIAGNOSIS — N186 End stage renal disease: Secondary | ICD-10-CM | POA: Diagnosis not present

## 2016-10-30 DIAGNOSIS — Z992 Dependence on renal dialysis: Secondary | ICD-10-CM | POA: Diagnosis not present

## 2016-11-01 DIAGNOSIS — Z992 Dependence on renal dialysis: Secondary | ICD-10-CM | POA: Diagnosis not present

## 2016-11-01 DIAGNOSIS — N186 End stage renal disease: Secondary | ICD-10-CM | POA: Diagnosis not present

## 2016-11-03 DIAGNOSIS — Z992 Dependence on renal dialysis: Secondary | ICD-10-CM | POA: Diagnosis not present

## 2016-11-03 DIAGNOSIS — N186 End stage renal disease: Secondary | ICD-10-CM | POA: Diagnosis not present

## 2016-11-06 DIAGNOSIS — N186 End stage renal disease: Secondary | ICD-10-CM | POA: Diagnosis not present

## 2016-11-06 DIAGNOSIS — Z992 Dependence on renal dialysis: Secondary | ICD-10-CM | POA: Diagnosis not present

## 2016-11-08 ENCOUNTER — Encounter (INDEPENDENT_AMBULATORY_CARE_PROVIDER_SITE_OTHER): Payer: Commercial Managed Care - HMO | Admitting: Vascular Surgery

## 2016-11-08 DIAGNOSIS — N186 End stage renal disease: Secondary | ICD-10-CM | POA: Diagnosis not present

## 2016-11-08 DIAGNOSIS — Z992 Dependence on renal dialysis: Secondary | ICD-10-CM | POA: Diagnosis not present

## 2016-11-10 DIAGNOSIS — N186 End stage renal disease: Secondary | ICD-10-CM | POA: Diagnosis not present

## 2016-11-10 DIAGNOSIS — Z992 Dependence on renal dialysis: Secondary | ICD-10-CM | POA: Diagnosis not present

## 2016-11-12 ENCOUNTER — Encounter (INDEPENDENT_AMBULATORY_CARE_PROVIDER_SITE_OTHER): Payer: Self-pay | Admitting: Vascular Surgery

## 2016-11-12 ENCOUNTER — Ambulatory Visit (INDEPENDENT_AMBULATORY_CARE_PROVIDER_SITE_OTHER): Payer: Medicare HMO | Admitting: Vascular Surgery

## 2016-11-12 VITALS — BP 138/62 | HR 55 | Resp 16 | Wt 244.6 lb

## 2016-11-12 DIAGNOSIS — E785 Hyperlipidemia, unspecified: Secondary | ICD-10-CM

## 2016-11-12 DIAGNOSIS — Z992 Dependence on renal dialysis: Secondary | ICD-10-CM | POA: Diagnosis not present

## 2016-11-12 DIAGNOSIS — T829XXS Unspecified complication of cardiac and vascular prosthetic device, implant and graft, sequela: Secondary | ICD-10-CM | POA: Diagnosis not present

## 2016-11-12 DIAGNOSIS — N186 End stage renal disease: Secondary | ICD-10-CM
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
## 2016-11-13 DIAGNOSIS — Z992 Dependence on renal dialysis: Secondary | ICD-10-CM | POA: Diagnosis not present

## 2016-11-13 DIAGNOSIS — N186 End stage renal disease: Secondary | ICD-10-CM | POA: Diagnosis not present

## 2016-11-14 DIAGNOSIS — Z992 Dependence on renal dialysis: Secondary | ICD-10-CM | POA: Diagnosis not present

## 2016-11-14 DIAGNOSIS — N186 End stage renal disease: Secondary | ICD-10-CM | POA: Diagnosis not present

## 2016-11-15 DIAGNOSIS — Z992 Dependence on renal dialysis: Secondary | ICD-10-CM | POA: Diagnosis not present

## 2016-11-15 DIAGNOSIS — N186 End stage renal disease: Secondary | ICD-10-CM | POA: Diagnosis not present

## 2016-11-16 DIAGNOSIS — Z992 Dependence on renal dialysis: Secondary | ICD-10-CM | POA: Diagnosis not present

## 2016-11-16 DIAGNOSIS — N186 End stage renal disease: Secondary | ICD-10-CM | POA: Diagnosis not present

## 2016-11-17 DIAGNOSIS — N186 End stage renal disease: Secondary | ICD-10-CM | POA: Diagnosis not present

## 2016-11-17 DIAGNOSIS — Z992 Dependence on renal dialysis: Secondary | ICD-10-CM | POA: Diagnosis not present

## 2016-11-18 DIAGNOSIS — Z992 Dependence on renal dialysis: Secondary | ICD-10-CM | POA: Diagnosis not present

## 2016-11-18 DIAGNOSIS — N186 End stage renal disease: Secondary | ICD-10-CM | POA: Diagnosis not present

## 2016-11-19 DIAGNOSIS — N186 End stage renal disease: Secondary | ICD-10-CM | POA: Diagnosis not present

## 2016-11-19 DIAGNOSIS — Z992 Dependence on renal dialysis: Secondary | ICD-10-CM | POA: Diagnosis not present

## 2016-11-20 DIAGNOSIS — Z992 Dependence on renal dialysis: Secondary | ICD-10-CM | POA: Diagnosis not present

## 2016-11-20 DIAGNOSIS — N186 End stage renal disease: Secondary | ICD-10-CM | POA: Diagnosis not present

## 2016-11-21 ENCOUNTER — Other Ambulatory Visit (INDEPENDENT_AMBULATORY_CARE_PROVIDER_SITE_OTHER): Payer: Medicare HMO

## 2016-11-21 ENCOUNTER — Encounter (INDEPENDENT_AMBULATORY_CARE_PROVIDER_SITE_OTHER): Payer: Self-pay | Admitting: Vascular Surgery

## 2016-11-21 ENCOUNTER — Ambulatory Visit (INDEPENDENT_AMBULATORY_CARE_PROVIDER_SITE_OTHER): Payer: Medicare HMO | Admitting: Vascular Surgery

## 2016-11-21 VITALS — BP 142/66 | HR 50 | Resp 16 | Ht 73.5 in | Wt 246.0 lb

## 2016-11-21 DIAGNOSIS — N186 End stage renal disease: Secondary | ICD-10-CM

## 2016-11-21 DIAGNOSIS — E1121 Type 2 diabetes mellitus with diabetic nephropathy: Secondary | ICD-10-CM | POA: Diagnosis not present

## 2016-11-21 DIAGNOSIS — Z992 Dependence on renal dialysis: Secondary | ICD-10-CM | POA: Diagnosis not present

## 2016-11-21 DIAGNOSIS — E785 Hyperlipidemia, unspecified: Secondary | ICD-10-CM

## 2016-11-22 DIAGNOSIS — N186 End stage renal disease: Secondary | ICD-10-CM | POA: Diagnosis not present

## 2016-11-22 DIAGNOSIS — Z992 Dependence on renal dialysis: Secondary | ICD-10-CM | POA: Diagnosis not present

## 2016-11-22 DIAGNOSIS — Z23 Encounter for immunization: Secondary | ICD-10-CM | POA: Diagnosis not present

## 2016-11-23 DIAGNOSIS — N186 End stage renal disease: Secondary | ICD-10-CM | POA: Diagnosis not present

## 2016-11-23 DIAGNOSIS — Z992 Dependence on renal dialysis: Secondary | ICD-10-CM | POA: Diagnosis not present

## 2016-11-23 DIAGNOSIS — Z23 Encounter for immunization: Secondary | ICD-10-CM | POA: Diagnosis not present

## 2016-11-24 DIAGNOSIS — Z992 Dependence on renal dialysis: Secondary | ICD-10-CM | POA: Diagnosis not present

## 2016-11-24 DIAGNOSIS — N186 End stage renal disease: Secondary | ICD-10-CM | POA: Diagnosis not present

## 2016-11-24 DIAGNOSIS — Z23 Encounter for immunization: Secondary | ICD-10-CM | POA: Diagnosis not present

## 2016-11-25 DIAGNOSIS — Z23 Encounter for immunization: Secondary | ICD-10-CM | POA: Diagnosis not present

## 2016-11-25 DIAGNOSIS — Z992 Dependence on renal dialysis: Secondary | ICD-10-CM | POA: Diagnosis not present

## 2016-11-25 DIAGNOSIS — N186 End stage renal disease: Secondary | ICD-10-CM | POA: Diagnosis not present

## 2016-11-26 DIAGNOSIS — Z992 Dependence on renal dialysis: Secondary | ICD-10-CM | POA: Diagnosis not present

## 2016-11-26 DIAGNOSIS — N186 End stage renal disease: Secondary | ICD-10-CM | POA: Diagnosis not present

## 2016-11-26 DIAGNOSIS — Z23 Encounter for immunization: Secondary | ICD-10-CM | POA: Diagnosis not present

## 2016-11-27 DIAGNOSIS — Z992 Dependence on renal dialysis: Secondary | ICD-10-CM | POA: Diagnosis not present

## 2016-11-27 DIAGNOSIS — Z23 Encounter for immunization: Secondary | ICD-10-CM | POA: Diagnosis not present

## 2016-11-27 DIAGNOSIS — N186 End stage renal disease: Secondary | ICD-10-CM | POA: Diagnosis not present

## 2016-11-28 DIAGNOSIS — Z992 Dependence on renal dialysis: Secondary | ICD-10-CM | POA: Diagnosis not present

## 2016-11-28 DIAGNOSIS — N186 End stage renal disease: Secondary | ICD-10-CM | POA: Diagnosis not present

## 2016-11-28 DIAGNOSIS — Z23 Encounter for immunization: Secondary | ICD-10-CM | POA: Diagnosis not present

## 2016-11-29 ENCOUNTER — Encounter (INDEPENDENT_AMBULATORY_CARE_PROVIDER_SITE_OTHER): Payer: Self-pay

## 2016-11-29 ENCOUNTER — Other Ambulatory Visit (INDEPENDENT_AMBULATORY_CARE_PROVIDER_SITE_OTHER): Payer: Self-pay

## 2016-11-29 DIAGNOSIS — Z992 Dependence on renal dialysis: Secondary | ICD-10-CM | POA: Diagnosis not present

## 2016-11-29 DIAGNOSIS — Z23 Encounter for immunization: Secondary | ICD-10-CM | POA: Diagnosis not present

## 2016-11-29 DIAGNOSIS — N186 End stage renal disease: Secondary | ICD-10-CM | POA: Diagnosis not present

## 2016-11-30 DIAGNOSIS — N186 End stage renal disease: Secondary | ICD-10-CM | POA: Diagnosis not present

## 2016-11-30 DIAGNOSIS — Z992 Dependence on renal dialysis: Secondary | ICD-10-CM | POA: Diagnosis not present

## 2016-11-30 DIAGNOSIS — Z23 Encounter for immunization: Secondary | ICD-10-CM | POA: Diagnosis not present

## 2016-12-01 ENCOUNTER — Emergency Department
Admission: EM | Admit: 2016-12-01 | Discharge: 2016-12-01 | Disposition: A | Discharge status: Home / Self Care | Payer: Medicare HMO | Attending: Emergency Medicine | Admitting: Emergency Medicine

## 2016-12-01 ENCOUNTER — Encounter: Payer: Self-pay | Admitting: Emergency Medicine

## 2016-12-01 DIAGNOSIS — N186 End stage renal disease: Secondary | ICD-10-CM | POA: Insufficient documentation

## 2016-12-01 DIAGNOSIS — Z7984 Long term (current) use of oral hypoglycemic drugs: Secondary | ICD-10-CM | POA: Diagnosis not present

## 2016-12-01 DIAGNOSIS — Z23 Encounter for immunization: Secondary | ICD-10-CM | POA: Diagnosis not present

## 2016-12-01 DIAGNOSIS — R197 Diarrhea, unspecified: Secondary | ICD-10-CM | POA: Diagnosis not present

## 2016-12-01 DIAGNOSIS — R69 Illness, unspecified: Secondary | ICD-10-CM

## 2016-12-01 DIAGNOSIS — J111 Influenza due to unidentified influenza virus with other respiratory manifestations: Secondary | ICD-10-CM | POA: Diagnosis not present

## 2016-12-01 DIAGNOSIS — R509 Fever, unspecified: Secondary | ICD-10-CM | POA: Diagnosis not present

## 2016-12-01 DIAGNOSIS — Z992 Dependence on renal dialysis: Secondary | ICD-10-CM | POA: Diagnosis not present

## 2016-12-01 DIAGNOSIS — I132 Hypertensive heart and chronic kidney disease with heart failure and with stage 5 chronic kidney disease, or end stage renal disease: Secondary | ICD-10-CM | POA: Insufficient documentation

## 2016-12-01 DIAGNOSIS — Z79899 Other long term (current) drug therapy: Secondary | ICD-10-CM | POA: Diagnosis not present

## 2016-12-01 DIAGNOSIS — M791 Myalgia: Secondary | ICD-10-CM | POA: Diagnosis not present

## 2016-12-01 DIAGNOSIS — I509 Heart failure, unspecified: Secondary | ICD-10-CM | POA: Insufficient documentation

## 2016-12-01 DIAGNOSIS — E1122 Type 2 diabetes mellitus with diabetic chronic kidney disease: Secondary | ICD-10-CM | POA: Diagnosis not present

## 2016-12-01 LAB — INFLUENZA PANEL BY PCR (TYPE A & B)
INFLAPCR: NEGATIVE
Influenza B By PCR: NEGATIVE

## 2016-12-01 MED ORDER — OSELTAMIVIR PHOSPHATE 30 MG PO CAPS: 30.0000 mg | ORAL_CAPSULE | Freq: Once | ORAL | 0 refills | Status: AC

## 2016-12-01 MED ORDER — ACETAMINOPHEN 325 MG PO TABS
650.0000 mg | ORAL_TABLET | Freq: Once | ORAL | Status: AC
Start: 2016-12-01 — End: 2016-12-01
  Administered 2016-12-01: 650 mg via ORAL
  Filled 2016-12-01: qty 2

## 2016-12-01 NOTE — ED Notes (Signed)
Pt verbalizes the importance of following up with nephrologist or PCP pt on peritoneal dialysis with fluid restriction pt verbalizes understanding

## 2016-12-01 NOTE — ED Notes (Signed)
Pt denies any pain at present reports feeling tired fever at home 102 took tylenol at 5pm, pt has a dry cough denies any other symptom

## 2016-12-01 NOTE — ED Triage Notes (Signed)
Pt has Perm Cath in place to R upper chest which is supposed to be removed this Tuesday.

## 2016-12-01 NOTE — ED Triage Notes (Signed)
Pt arrived via POV, pt had fever of 102 at home around 5pm and was given 2 Tylenol ES at that time. Pt has had diarrhea x3 today, aches in legs and decreased appetite. Denies any abdominal pain or chest pain.  Pt states since he moved around his pain in the legs improved.  Pt is Peritoneal Dialysis patient since August 2017 and had a full treatment last night.

## 2016-12-01 NOTE — ED Notes (Signed)
Spoke with Dr. Jimmye Norman re: pt. Do flu swab in triage, no lab work needed.  Pt can be seen in the flex care area.
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
## 2016-12-02 DIAGNOSIS — Z23 Encounter for immunization: Secondary | ICD-10-CM | POA: Diagnosis not present

## 2016-12-02 DIAGNOSIS — Z992 Dependence on renal dialysis: Secondary | ICD-10-CM | POA: Diagnosis not present

## 2016-12-02 DIAGNOSIS — N186 End stage renal disease: Secondary | ICD-10-CM | POA: Diagnosis not present

## 2016-12-03 DIAGNOSIS — Z992 Dependence on renal dialysis: Secondary | ICD-10-CM | POA: Diagnosis not present

## 2016-12-03 DIAGNOSIS — Z23 Encounter for immunization: Secondary | ICD-10-CM | POA: Diagnosis not present

## 2016-12-03 DIAGNOSIS — N186 End stage renal disease: Secondary | ICD-10-CM | POA: Diagnosis not present

## 2016-12-04 ENCOUNTER — Encounter
Admission: RE | Disposition: A | Discharge status: Home / Self Care | Payer: Self-pay | Source: Ambulatory Visit | Attending: Vascular Surgery

## 2016-12-04 ENCOUNTER — Ambulatory Visit
Admission: RE | Admit: 2016-12-04 | Discharge: 2016-12-04 | Disposition: A | Discharge status: Home / Self Care | Payer: Medicare HMO | Source: Ambulatory Visit | Attending: Vascular Surgery | Admitting: Vascular Surgery

## 2016-12-04 DIAGNOSIS — Z8042 Family history of malignant neoplasm of prostate: Secondary | ICD-10-CM | POA: Insufficient documentation

## 2016-12-04 DIAGNOSIS — Z841 Family history of disorders of kidney and ureter: Secondary | ICD-10-CM | POA: Insufficient documentation

## 2016-12-04 DIAGNOSIS — G43909 Migraine, unspecified, not intractable, without status migrainosus: Secondary | ICD-10-CM | POA: Diagnosis not present

## 2016-12-04 DIAGNOSIS — Z9109 Other allergy status, other than to drugs and biological substances: Secondary | ICD-10-CM | POA: Insufficient documentation

## 2016-12-04 DIAGNOSIS — Z9889 Other specified postprocedural states: Secondary | ICD-10-CM | POA: Diagnosis not present

## 2016-12-04 DIAGNOSIS — D638 Anemia in other chronic diseases classified elsewhere: Secondary | ICD-10-CM | POA: Insufficient documentation

## 2016-12-04 DIAGNOSIS — E1122 Type 2 diabetes mellitus with diabetic chronic kidney disease: Secondary | ICD-10-CM | POA: Insufficient documentation

## 2016-12-04 DIAGNOSIS — N529 Male erectile dysfunction, unspecified: Secondary | ICD-10-CM | POA: Insufficient documentation

## 2016-12-04 DIAGNOSIS — Z809 Family history of malignant neoplasm, unspecified: Secondary | ICD-10-CM | POA: Insufficient documentation

## 2016-12-04 DIAGNOSIS — I509 Heart failure, unspecified: Secondary | ICD-10-CM | POA: Diagnosis not present

## 2016-12-04 DIAGNOSIS — Z992 Dependence on renal dialysis: Secondary | ICD-10-CM | POA: Diagnosis not present

## 2016-12-04 DIAGNOSIS — Z8614 Personal history of Methicillin resistant Staphylococcus aureus infection: Secondary | ICD-10-CM | POA: Diagnosis not present

## 2016-12-04 DIAGNOSIS — Z8249 Family history of ischemic heart disease and other diseases of the circulatory system: Secondary | ICD-10-CM | POA: Insufficient documentation

## 2016-12-04 DIAGNOSIS — Z91013 Allergy to seafood: Secondary | ICD-10-CM | POA: Insufficient documentation

## 2016-12-04 DIAGNOSIS — E785 Hyperlipidemia, unspecified: Secondary | ICD-10-CM | POA: Insufficient documentation

## 2016-12-04 DIAGNOSIS — I132 Hypertensive heart and chronic kidney disease with heart failure and with stage 5 chronic kidney disease, or end stage renal disease: Secondary | ICD-10-CM | POA: Diagnosis not present

## 2016-12-04 DIAGNOSIS — Z886 Allergy status to analgesic agent status: Secondary | ICD-10-CM | POA: Diagnosis not present

## 2016-12-04 DIAGNOSIS — N186 End stage renal disease: Secondary | ICD-10-CM | POA: Insufficient documentation

## 2016-12-04 DIAGNOSIS — M109 Gout, unspecified: Secondary | ICD-10-CM | POA: Diagnosis not present

## 2016-12-04 DIAGNOSIS — Z91041 Radiographic dye allergy status: Secondary | ICD-10-CM | POA: Diagnosis not present

## 2016-12-04 DIAGNOSIS — Z23 Encounter for immunization: Secondary | ICD-10-CM | POA: Diagnosis not present

## 2016-12-04 DIAGNOSIS — Z452 Encounter for adjustment and management of vascular access device: Secondary | ICD-10-CM | POA: Insufficient documentation

## 2016-12-04 HISTORY — PX: DIALYSIS/PERMA CATHETER REMOVAL: CATH118289

## 2016-12-04 SURGERY — DIALYSIS/PERMA CATHETER REMOVAL
Anesthesia: Moderate Sedation

## 2016-12-04 MED ORDER — SODIUM CHLORIDE 0.9 % IV SOLN: INTRAVENOUS | Status: DC

## 2016-12-04 SURGICAL SUPPLY — 1 items: TRAY LACERAT/PLASTIC (MISCELLANEOUS) ×2 IMPLANT

## 2016-12-04 NOTE — Discharge Instructions (Signed)
Incision Care, Adult °An incision is a cut that a doctor makes in your skin for surgery (for a procedure). Most times, these cuts are closed after surgery. Your cut from surgery may be closed with stitches (sutures), staples, skin glue, or skin tape (adhesive strips). You may need to return to your doctor to have stitches or staples taken out. This may happen many days or many weeks after your surgery. The cut needs to be well cared for so it does not get infected. °How to care for your cut °Cut care °· Follow instructions from your doctor about how to take care of your cut. Make sure you: °? Wash your hands with soap and water before you change your bandage (dressing). If you cannot use soap and water, use hand sanitizer. °? Change your bandage as told by your doctor. °? Leave stitches, skin glue, or skin tape in place. They may need to stay in place for 2 weeks or longer. If tape strips get loose and curl up, you may trim the loose edges. Do not remove tape strips completely unless your doctor says it is okay. °· Check your cut area every day for signs of infection. Check for: °? More redness, swelling, or pain. °? More fluid or blood. °? Warmth. °? Pus or a bad smell. °· Ask your doctor how to clean the cut. This may include: °? Using mild soap and water. °? Using a clean towel to pat the cut dry after you clean it. °? Putting a cream or ointment on the cut. Do this only as told by your doctor. °? Covering the cut with a clean bandage. °· Ask your doctor when you can leave the cut uncovered. °· Do not take baths, swim, or use a hot tub until your doctor says it is okay. Ask your doctor if you can take showers. You may only be allowed to take sponge baths for bathing. °Medicines °· If you were prescribed an antibiotic medicine, cream, or ointment, take the antibiotic or put it on the cut as told by your doctor. Do not stop taking or putting on the antibiotic even if your condition gets better. °· Take  over-the-counter and prescription medicines only as told by your doctor. °General instructions °· Limit movement around your cut. This helps healing. °? Avoid straining, lifting, or exercise for the first month, or for as long as told by your doctor. °? Follow instructions from your doctor about going back to your normal activities. °? Ask your doctor what activities are safe. °· Protect your cut from the sun when you are outside for the first 6 months, or for as long as told by your doctor. Put on sunscreen around the scar or cover up the scar. °· Keep all follow-up visits as told by your doctor. This is important. °Contact a doctor if: °· Your have more redness, swelling, or pain around the cut. °· You have more fluid or blood coming from the cut. °· Your cut feels warm to the touch. °· You have pus or a bad smell coming from the cut. °· You have a fever or shaking chills. °· You feel sick to your stomach (nauseous) or you throw up (vomit). °· You are dizzy. °· Your stitches or staples come undone. °Get help right away if: °· You have a red streak coming from your cut. °· Your cut bleeds through the bandage and the bleeding does not stop with gentle pressure. °· The edges of your cut open   up and separate. °· You have very bad (severe) pain. °· You have a rash. °· You are confused. °· You pass out (faint). °· You have trouble breathing and you have a fast heartbeat. °This information is not intended to replace advice given to you by your health care provider. Make sure you discuss any questions you have with your health care provider. °Document Released: 12/31/2011 Document Revised: 06/15/2016 Document Reviewed: 06/15/2016 °Elsevier Interactive Patient Education © 2017 [employer] Inc[employer] ° °
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
## 2016-12-04 NOTE — Op Note (Signed)
  OPERATIVE NOTE   PROCEDURE: 1. Removal of a right IJ tunneled dialysis catheter  PRE-OPERATIVE DIAGNOSIS: Complication of dialysis catheter, End stage renal disease  POST-OPERATIVE DIAGNOSIS: Same  SURGEON: Hortencia Pilar, M.D.  ANESTHESIA: Local anesthetic with 1% lidocaine with epinephrine   ESTIMATED BLOOD LOSS: Minimal   FINDING(S): 1. Catheter intact   SPECIMEN(S):  Catheter  INDICATIONS:   Richard Chen is a 61 y.o. male who presents with clotted right IJ catheter and a functioning PD catheter.  Therefore he is undergoing removal of the tunneled catheter which is no longer needed to avoid septic complications.   DESCRIPTION: After obtaining full informed written consent, the patient was positioned supine. The right IJ catheter and surrounding area is prepped and draped in a sterile fashion. The cuff was localized by palpation and noted to be less than 3 cm from the exit site. After appropriate timeout is called, 1% lidocaine with epinephrine is infiltrated into the surrounding tissues around the cuff. Small transverse incision is created at the exit site with an 11 blade scalpel and the dissection was carried up along the catheter to expose the cuff of the tunneled catheter.  The catheter cuff is then freed from the surrounding attachments and adhesions. Once the catheter has been freed circumferentially it is removed in 1 piece. Light pressure was held at the base of the neck.   Antibiotic ointment and a sterile dressing is applied to the exit site. Patient tolerated procedure well and there were no complications.  COMPLICATIONS: None  CONDITION: Unchanged  Hortencia Pilar, M.D. [facility] Office: [phone]  12/04/2016,1:55 PM

## 2016-12-05 ENCOUNTER — Encounter: Payer: Self-pay | Admitting: Vascular Surgery

## 2016-12-05 ENCOUNTER — Telehealth: Payer: Self-pay

## 2016-12-05 DIAGNOSIS — N186 End stage renal disease: Secondary | ICD-10-CM | POA: Diagnosis not present

## 2016-12-05 DIAGNOSIS — Z23 Encounter for immunization: Secondary | ICD-10-CM | POA: Diagnosis not present

## 2016-12-05 DIAGNOSIS — Z992 Dependence on renal dialysis: Secondary | ICD-10-CM | POA: Diagnosis not present

## 2016-12-05 NOTE — Telephone Encounter (Signed)
Spoke to pt. He said he is feeling much better.

## 2016-12-06 DIAGNOSIS — N186 End stage renal disease: Secondary | ICD-10-CM | POA: Diagnosis not present

## 2016-12-06 DIAGNOSIS — Z23 Encounter for immunization: Secondary | ICD-10-CM | POA: Diagnosis not present

## 2016-12-06 DIAGNOSIS — Z992 Dependence on renal dialysis: Secondary | ICD-10-CM | POA: Diagnosis not present

## 2016-12-07 DIAGNOSIS — Z992 Dependence on renal dialysis: Secondary | ICD-10-CM | POA: Diagnosis not present

## 2016-12-07 DIAGNOSIS — Z23 Encounter for immunization: Secondary | ICD-10-CM | POA: Diagnosis not present

## 2016-12-07 DIAGNOSIS — N186 End stage renal disease: Secondary | ICD-10-CM | POA: Diagnosis not present

## 2016-12-08 DIAGNOSIS — N186 End stage renal disease: Secondary | ICD-10-CM | POA: Diagnosis not present

## 2016-12-08 DIAGNOSIS — Z992 Dependence on renal dialysis: Secondary | ICD-10-CM | POA: Diagnosis not present

## 2016-12-08 DIAGNOSIS — Z23 Encounter for immunization: Secondary | ICD-10-CM | POA: Diagnosis not present

## 2016-12-09 DIAGNOSIS — Z23 Encounter for immunization: Secondary | ICD-10-CM | POA: Diagnosis not present

## 2016-12-09 DIAGNOSIS — Z992 Dependence on renal dialysis: Secondary | ICD-10-CM | POA: Diagnosis not present

## 2016-12-09 DIAGNOSIS — N186 End stage renal disease: Secondary | ICD-10-CM | POA: Diagnosis not present

## 2016-12-10 DIAGNOSIS — N186 End stage renal disease: Secondary | ICD-10-CM | POA: Diagnosis not present

## 2016-12-10 DIAGNOSIS — Z23 Encounter for immunization: Secondary | ICD-10-CM | POA: Diagnosis not present

## 2016-12-10 DIAGNOSIS — Z992 Dependence on renal dialysis: Secondary | ICD-10-CM | POA: Diagnosis not present

## 2016-12-11 DIAGNOSIS — N186 End stage renal disease: Secondary | ICD-10-CM | POA: Diagnosis not present

## 2016-12-11 DIAGNOSIS — Z992 Dependence on renal dialysis: Secondary | ICD-10-CM | POA: Diagnosis not present

## 2016-12-11 DIAGNOSIS — Z23 Encounter for immunization: Secondary | ICD-10-CM | POA: Diagnosis not present

## 2016-12-12 DIAGNOSIS — N186 End stage renal disease: Secondary | ICD-10-CM | POA: Diagnosis not present

## 2016-12-12 DIAGNOSIS — Z23 Encounter for immunization: Secondary | ICD-10-CM | POA: Diagnosis not present

## 2016-12-12 DIAGNOSIS — Z992 Dependence on renal dialysis: Secondary | ICD-10-CM | POA: Diagnosis not present

## 2016-12-13 DIAGNOSIS — N186 End stage renal disease: Secondary | ICD-10-CM | POA: Diagnosis not present

## 2016-12-13 DIAGNOSIS — Z23 Encounter for immunization: Secondary | ICD-10-CM | POA: Diagnosis not present

## 2016-12-13 DIAGNOSIS — Z992 Dependence on renal dialysis: Secondary | ICD-10-CM | POA: Diagnosis not present

## 2016-12-14 DIAGNOSIS — Z23 Encounter for immunization: Secondary | ICD-10-CM | POA: Diagnosis not present

## 2016-12-14 DIAGNOSIS — N186 End stage renal disease: Secondary | ICD-10-CM | POA: Diagnosis not present

## 2016-12-14 DIAGNOSIS — Z992 Dependence on renal dialysis: Secondary | ICD-10-CM | POA: Diagnosis not present

## 2016-12-15 DIAGNOSIS — Z23 Encounter for immunization: Secondary | ICD-10-CM | POA: Diagnosis not present

## 2016-12-15 DIAGNOSIS — Z992 Dependence on renal dialysis: Secondary | ICD-10-CM | POA: Diagnosis not present

## 2016-12-15 DIAGNOSIS — N186 End stage renal disease: Secondary | ICD-10-CM | POA: Diagnosis not present

## 2016-12-16 DIAGNOSIS — Z992 Dependence on renal dialysis: Secondary | ICD-10-CM | POA: Diagnosis not present

## 2016-12-16 DIAGNOSIS — Z23 Encounter for immunization: Secondary | ICD-10-CM | POA: Diagnosis not present

## 2016-12-16 DIAGNOSIS — N186 End stage renal disease: Secondary | ICD-10-CM | POA: Diagnosis not present

## 2016-12-17 ENCOUNTER — Ambulatory Visit (INDEPENDENT_AMBULATORY_CARE_PROVIDER_SITE_OTHER): Payer: Medicare HMO | Admitting: Vascular Surgery

## 2016-12-17 ENCOUNTER — Encounter (INDEPENDENT_AMBULATORY_CARE_PROVIDER_SITE_OTHER): Payer: Self-pay | Admitting: Vascular Surgery

## 2016-12-17 VITALS — BP 149/65 | HR 53 | Resp 16 | Ht 73.5 in | Wt 240.0 lb

## 2016-12-17 DIAGNOSIS — N186 End stage renal disease: Secondary | ICD-10-CM

## 2016-12-17 DIAGNOSIS — E785 Hyperlipidemia, unspecified: Secondary | ICD-10-CM | POA: Diagnosis not present

## 2016-12-17 DIAGNOSIS — K439 Ventral hernia without obstruction or gangrene: Secondary | ICD-10-CM | POA: Diagnosis not present

## 2016-12-17 DIAGNOSIS — E1121 Type 2 diabetes mellitus with diabetic nephropathy: Secondary | ICD-10-CM | POA: Diagnosis not present

## 2016-12-17 DIAGNOSIS — Z992 Dependence on renal dialysis: Secondary | ICD-10-CM

## 2016-12-17 DIAGNOSIS — I10 Essential (primary) hypertension: Secondary | ICD-10-CM

## 2016-12-17 DIAGNOSIS — Z23 Encounter for immunization: Secondary | ICD-10-CM | POA: Diagnosis not present
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
## 2016-12-18 DIAGNOSIS — Z992 Dependence on renal dialysis: Secondary | ICD-10-CM | POA: Diagnosis not present

## 2016-12-18 DIAGNOSIS — Z23 Encounter for immunization: Secondary | ICD-10-CM | POA: Diagnosis not present

## 2016-12-18 DIAGNOSIS — N186 End stage renal disease: Secondary | ICD-10-CM | POA: Diagnosis not present

## 2016-12-19 DIAGNOSIS — Z23 Encounter for immunization: Secondary | ICD-10-CM | POA: Diagnosis not present

## 2016-12-19 DIAGNOSIS — Z992 Dependence on renal dialysis: Secondary | ICD-10-CM | POA: Diagnosis not present

## 2016-12-19 DIAGNOSIS — N186 End stage renal disease: Secondary | ICD-10-CM | POA: Diagnosis not present

## 2016-12-20 DIAGNOSIS — Z992 Dependence on renal dialysis: Secondary | ICD-10-CM | POA: Diagnosis not present

## 2016-12-20 DIAGNOSIS — N186 End stage renal disease: Secondary | ICD-10-CM | POA: Diagnosis not present

## 2016-12-21 DIAGNOSIS — Z7682 Awaiting organ transplant status: Secondary | ICD-10-CM | POA: Diagnosis not present

## 2016-12-21 DIAGNOSIS — N186 End stage renal disease: Secondary | ICD-10-CM | POA: Diagnosis not present

## 2016-12-21 DIAGNOSIS — Z992 Dependence on renal dialysis: Secondary | ICD-10-CM | POA: Diagnosis not present

## 2016-12-22 DIAGNOSIS — Z992 Dependence on renal dialysis: Secondary | ICD-10-CM | POA: Diagnosis not present

## 2016-12-22 DIAGNOSIS — N186 End stage renal disease: Secondary | ICD-10-CM | POA: Diagnosis not present

## 2016-12-23 DIAGNOSIS — N186 End stage renal disease: Secondary | ICD-10-CM | POA: Diagnosis not present

## 2016-12-23 DIAGNOSIS — Z992 Dependence on renal dialysis: Secondary | ICD-10-CM | POA: Diagnosis not present

## 2016-12-24 DIAGNOSIS — N186 End stage renal disease: Secondary | ICD-10-CM | POA: Diagnosis not present

## 2016-12-24 DIAGNOSIS — Z992 Dependence on renal dialysis: Secondary | ICD-10-CM | POA: Diagnosis not present

## 2016-12-25 DIAGNOSIS — Z992 Dependence on renal dialysis: Secondary | ICD-10-CM | POA: Diagnosis not present

## 2016-12-25 DIAGNOSIS — N186 End stage renal disease: Secondary | ICD-10-CM | POA: Diagnosis not present

## 2016-12-26 DIAGNOSIS — Z992 Dependence on renal dialysis: Secondary | ICD-10-CM | POA: Diagnosis not present

## 2016-12-26 DIAGNOSIS — N186 End stage renal disease: Secondary | ICD-10-CM | POA: Diagnosis not present

## 2016-12-27 ENCOUNTER — Ambulatory Visit: Payer: Commercial Managed Care - HMO | Admitting: General Surgery

## 2016-12-27 DIAGNOSIS — Z992 Dependence on renal dialysis: Secondary | ICD-10-CM | POA: Diagnosis not present

## 2016-12-27 DIAGNOSIS — N186 End stage renal disease: Secondary | ICD-10-CM | POA: Diagnosis not present

## 2016-12-28 DIAGNOSIS — N186 End stage renal disease: Secondary | ICD-10-CM | POA: Diagnosis not present

## 2016-12-28 DIAGNOSIS — Z992 Dependence on renal dialysis: Secondary | ICD-10-CM | POA: Diagnosis not present

## 2016-12-29 DIAGNOSIS — N186 End stage renal disease: Secondary | ICD-10-CM | POA: Diagnosis not present

## 2016-12-29 DIAGNOSIS — Z992 Dependence on renal dialysis: Secondary | ICD-10-CM | POA: Diagnosis not present

## 2016-12-30 DIAGNOSIS — N186 End stage renal disease: Secondary | ICD-10-CM | POA: Diagnosis not present

## 2016-12-30 DIAGNOSIS — Z992 Dependence on renal dialysis: Secondary | ICD-10-CM | POA: Diagnosis not present

## 2016-12-31 DIAGNOSIS — Z992 Dependence on renal dialysis: Secondary | ICD-10-CM | POA: Diagnosis not present

## 2016-12-31 DIAGNOSIS — N186 End stage renal disease: Secondary | ICD-10-CM | POA: Diagnosis not present

## 2017-01-01 DIAGNOSIS — N186 End stage renal disease: Secondary | ICD-10-CM | POA: Diagnosis not present

## 2017-01-01 DIAGNOSIS — Z992 Dependence on renal dialysis: Secondary | ICD-10-CM | POA: Diagnosis not present
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
## 2017-01-02 DIAGNOSIS — Z992 Dependence on renal dialysis: Secondary | ICD-10-CM | POA: Diagnosis not present

## 2017-01-02 DIAGNOSIS — N186 End stage renal disease: Secondary | ICD-10-CM | POA: Diagnosis not present

## 2017-01-03 DIAGNOSIS — N186 End stage renal disease: Secondary | ICD-10-CM | POA: Diagnosis not present

## 2017-01-03 DIAGNOSIS — Z992 Dependence on renal dialysis: Secondary | ICD-10-CM | POA: Diagnosis not present

## 2017-01-04 DIAGNOSIS — Z992 Dependence on renal dialysis: Secondary | ICD-10-CM | POA: Diagnosis not present

## 2017-01-04 DIAGNOSIS — N186 End stage renal disease: Secondary | ICD-10-CM | POA: Diagnosis not present

## 2017-01-05 DIAGNOSIS — Z992 Dependence on renal dialysis: Secondary | ICD-10-CM | POA: Diagnosis not present

## 2017-01-05 DIAGNOSIS — N186 End stage renal disease: Secondary | ICD-10-CM | POA: Diagnosis not present

## 2017-01-06 DIAGNOSIS — N186 End stage renal disease: Secondary | ICD-10-CM | POA: Diagnosis not present

## 2017-01-06 DIAGNOSIS — Z992 Dependence on renal dialysis: Secondary | ICD-10-CM | POA: Diagnosis not present

## 2017-01-07 ENCOUNTER — Ambulatory Visit (INDEPENDENT_AMBULATORY_CARE_PROVIDER_SITE_OTHER): Payer: Medicare HMO | Admitting: General Surgery

## 2017-01-07 ENCOUNTER — Encounter: Payer: Self-pay | Admitting: General Surgery

## 2017-01-07 DIAGNOSIS — N186 End stage renal disease: Secondary | ICD-10-CM | POA: Diagnosis not present

## 2017-01-07 DIAGNOSIS — K439 Ventral hernia without obstruction or gangrene: Secondary | ICD-10-CM | POA: Diagnosis not present

## 2017-01-07 DIAGNOSIS — Z992 Dependence on renal dialysis: Secondary | ICD-10-CM | POA: Diagnosis not present
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
## 2017-01-07 NOTE — Patient Instructions (Addendum)
Please give [place] a call if you have any questions or concerns. If you notice that your umbilical hernia doesn't get back in, let us know so we could repair it.

## 2017-01-08 DIAGNOSIS — Z992 Dependence on renal dialysis: Secondary | ICD-10-CM | POA: Diagnosis not present

## 2017-01-08 DIAGNOSIS — N186 End stage renal disease: Secondary | ICD-10-CM | POA: Diagnosis not present

## 2017-01-09 DIAGNOSIS — Z992 Dependence on renal dialysis: Secondary | ICD-10-CM | POA: Diagnosis not present

## 2017-01-09 DIAGNOSIS — N186 End stage renal disease: Secondary | ICD-10-CM | POA: Diagnosis not present

## 2017-01-10 ENCOUNTER — Other Ambulatory Visit: Payer: Self-pay | Admitting: Internal Medicine

## 2017-01-10 DIAGNOSIS — N186 End stage renal disease: Secondary | ICD-10-CM | POA: Diagnosis not present

## 2017-01-10 DIAGNOSIS — Z992 Dependence on renal dialysis: Secondary | ICD-10-CM | POA: Diagnosis not present

## 2017-01-11 DIAGNOSIS — Z992 Dependence on renal dialysis: Secondary | ICD-10-CM | POA: Diagnosis not present

## 2017-01-11 DIAGNOSIS — N186 End stage renal disease: Secondary | ICD-10-CM | POA: Diagnosis not present

## 2017-01-12 DIAGNOSIS — Z992 Dependence on renal dialysis: Secondary | ICD-10-CM | POA: Diagnosis not present

## 2017-01-12 DIAGNOSIS — N186 End stage renal disease: Secondary | ICD-10-CM | POA: Diagnosis not present

## 2017-01-13 DIAGNOSIS — Z992 Dependence on renal dialysis: Secondary | ICD-10-CM | POA: Diagnosis not present

## 2017-01-13 DIAGNOSIS — N186 End stage renal disease: Secondary | ICD-10-CM | POA: Diagnosis not present

## 2017-01-14 DIAGNOSIS — Z992 Dependence on renal dialysis: Secondary | ICD-10-CM | POA: Diagnosis not present

## 2017-01-14 DIAGNOSIS — N186 End stage renal disease: Secondary | ICD-10-CM | POA: Diagnosis not present

## 2017-01-15 DIAGNOSIS — Z992 Dependence on renal dialysis: Secondary | ICD-10-CM | POA: Diagnosis not present

## 2017-01-15 DIAGNOSIS — N186 End stage renal disease: Secondary | ICD-10-CM | POA: Diagnosis not present

## 2017-01-16 DIAGNOSIS — Z992 Dependence on renal dialysis: Secondary | ICD-10-CM | POA: Diagnosis not present

## 2017-01-16 DIAGNOSIS — N186 End stage renal disease: Secondary | ICD-10-CM | POA: Diagnosis not present

## 2017-01-17 DIAGNOSIS — Z992 Dependence on renal dialysis: Secondary | ICD-10-CM | POA: Diagnosis not present

## 2017-01-17 DIAGNOSIS — N186 End stage renal disease: Secondary | ICD-10-CM | POA: Diagnosis not present

## 2017-01-18 DIAGNOSIS — N186 End stage renal disease: Secondary | ICD-10-CM | POA: Diagnosis not present

## 2017-01-18 DIAGNOSIS — Z992 Dependence on renal dialysis: Secondary | ICD-10-CM | POA: Diagnosis not present

## 2017-01-19 DIAGNOSIS — Z992 Dependence on renal dialysis: Secondary | ICD-10-CM | POA: Diagnosis not present

## 2017-01-19 DIAGNOSIS — N186 End stage renal disease: Secondary | ICD-10-CM | POA: Diagnosis not present

## 2017-01-20 DIAGNOSIS — N186 End stage renal disease: Secondary | ICD-10-CM | POA: Diagnosis not present

## 2017-01-20 DIAGNOSIS — Z992 Dependence on renal dialysis: Secondary | ICD-10-CM | POA: Diagnosis not present

## 2017-01-21 DIAGNOSIS — Z992 Dependence on renal dialysis: Secondary | ICD-10-CM | POA: Diagnosis not present

## 2017-01-21 DIAGNOSIS — N186 End stage renal disease: Secondary | ICD-10-CM | POA: Diagnosis not present

## 2017-01-22 DIAGNOSIS — N186 End stage renal disease: Secondary | ICD-10-CM | POA: Diagnosis not present

## 2017-01-22 DIAGNOSIS — Z992 Dependence on renal dialysis: Secondary | ICD-10-CM | POA: Diagnosis not present

## 2017-01-22 DIAGNOSIS — E119 Type 2 diabetes mellitus without complications: Secondary | ICD-10-CM | POA: Diagnosis not present

## 2017-01-23 DIAGNOSIS — Z992 Dependence on renal dialysis: Secondary | ICD-10-CM | POA: Diagnosis not present

## 2017-01-23 DIAGNOSIS — N186 End stage renal disease: Secondary | ICD-10-CM | POA: Diagnosis not present

## 2017-01-24 ENCOUNTER — Other Ambulatory Visit: Payer: Self-pay | Admitting: Internal Medicine

## 2017-01-24 DIAGNOSIS — N186 End stage renal disease: Secondary | ICD-10-CM | POA: Diagnosis not present

## 2017-01-24 DIAGNOSIS — Z992 Dependence on renal dialysis: Secondary | ICD-10-CM | POA: Diagnosis not present

## 2017-01-25 DIAGNOSIS — Z992 Dependence on renal dialysis: Secondary | ICD-10-CM | POA: Diagnosis not present

## 2017-01-25 DIAGNOSIS — N186 End stage renal disease: Secondary | ICD-10-CM | POA: Diagnosis not present

## 2017-01-26 DIAGNOSIS — N186 End stage renal disease: Secondary | ICD-10-CM | POA: Diagnosis not present

## 2017-01-26 DIAGNOSIS — Z992 Dependence on renal dialysis: Secondary | ICD-10-CM | POA: Diagnosis not present

## 2017-01-27 DIAGNOSIS — Z992 Dependence on renal dialysis: Secondary | ICD-10-CM | POA: Diagnosis not present

## 2017-01-27 DIAGNOSIS — N186 End stage renal disease: Secondary | ICD-10-CM | POA: Diagnosis not present

## 2017-01-27 IMAGING — CR DG CHEST 2V
2 series · 2 of 2 positions shown · non-contrast
Comparison: None.

CLINICAL DATA: Screening pulmonary tuberculosis

EXAM:
CHEST  2 VIEW

[chest lat]
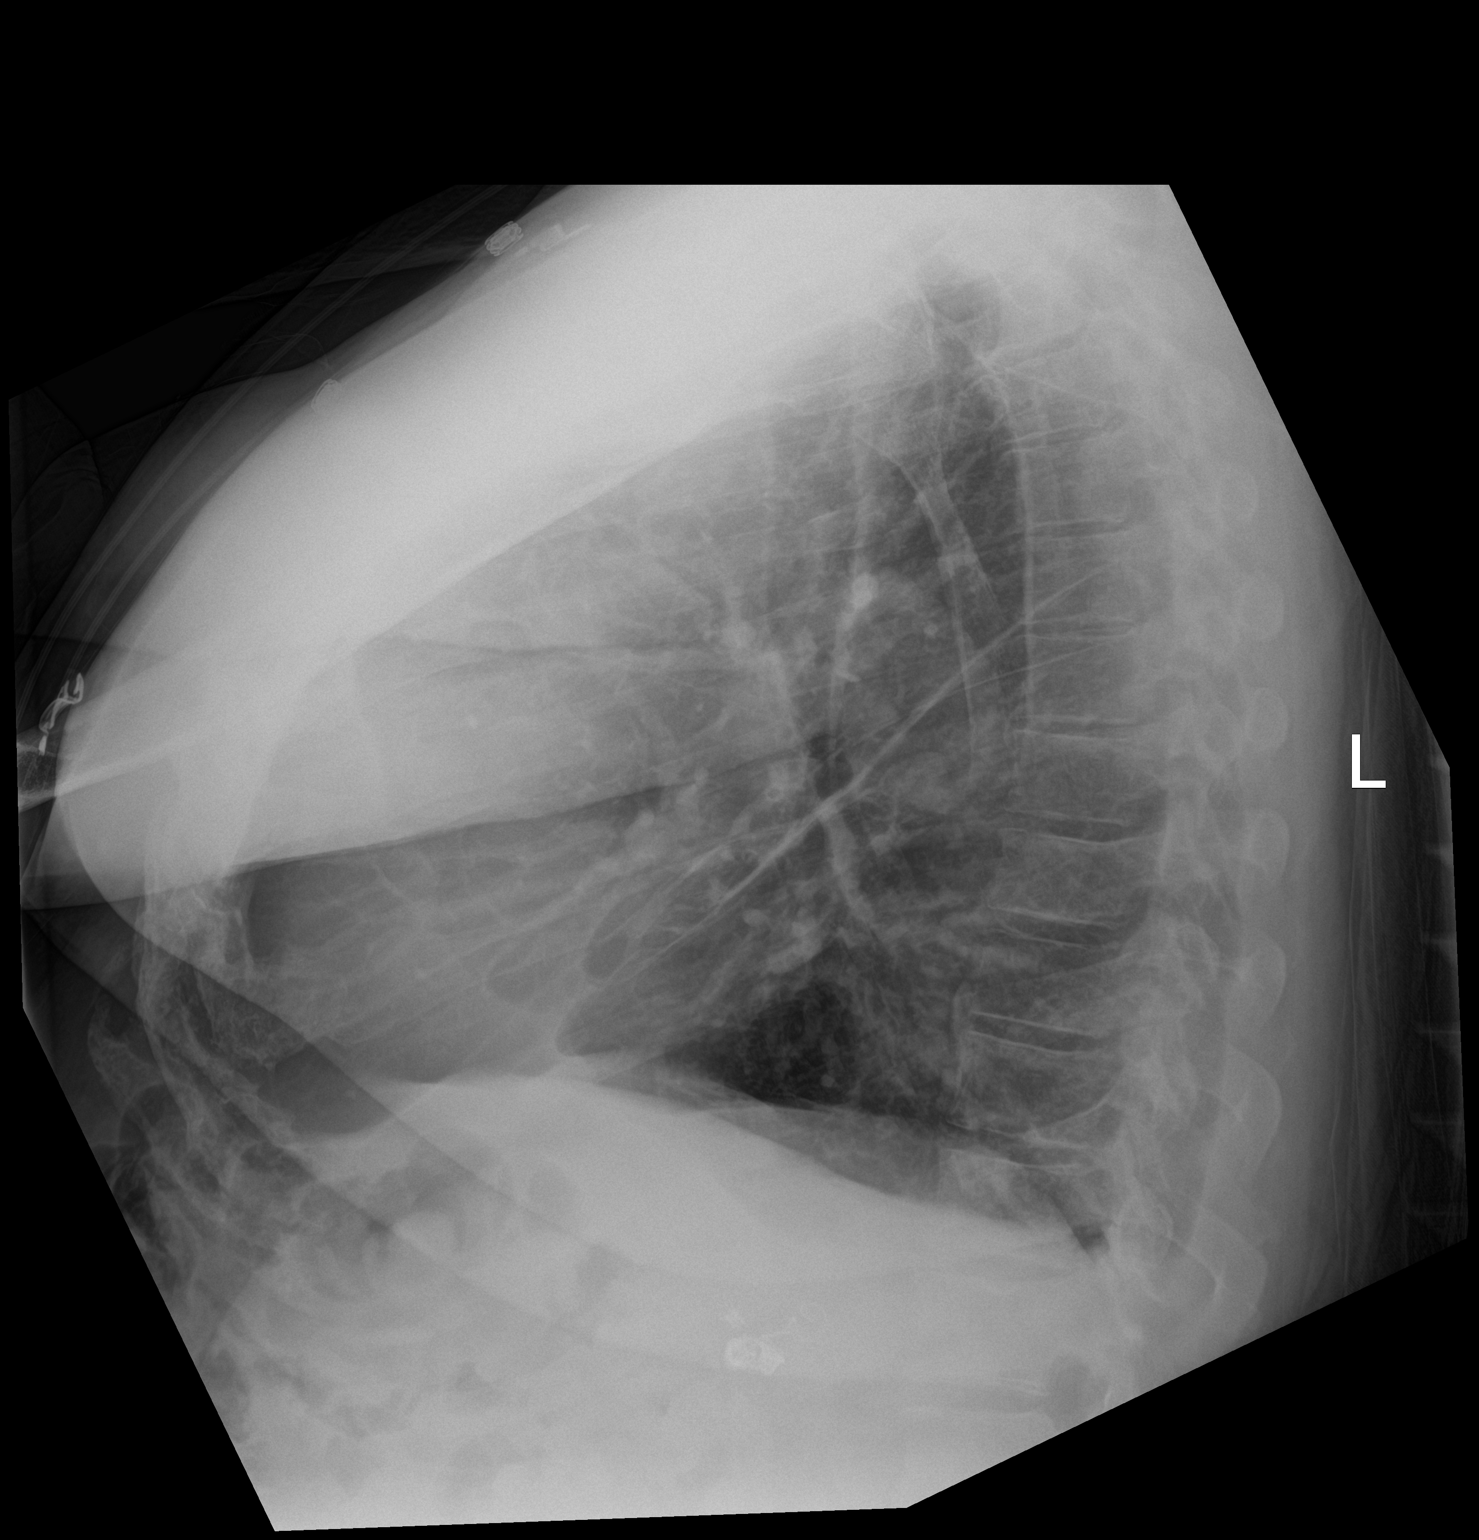

[chest ap]
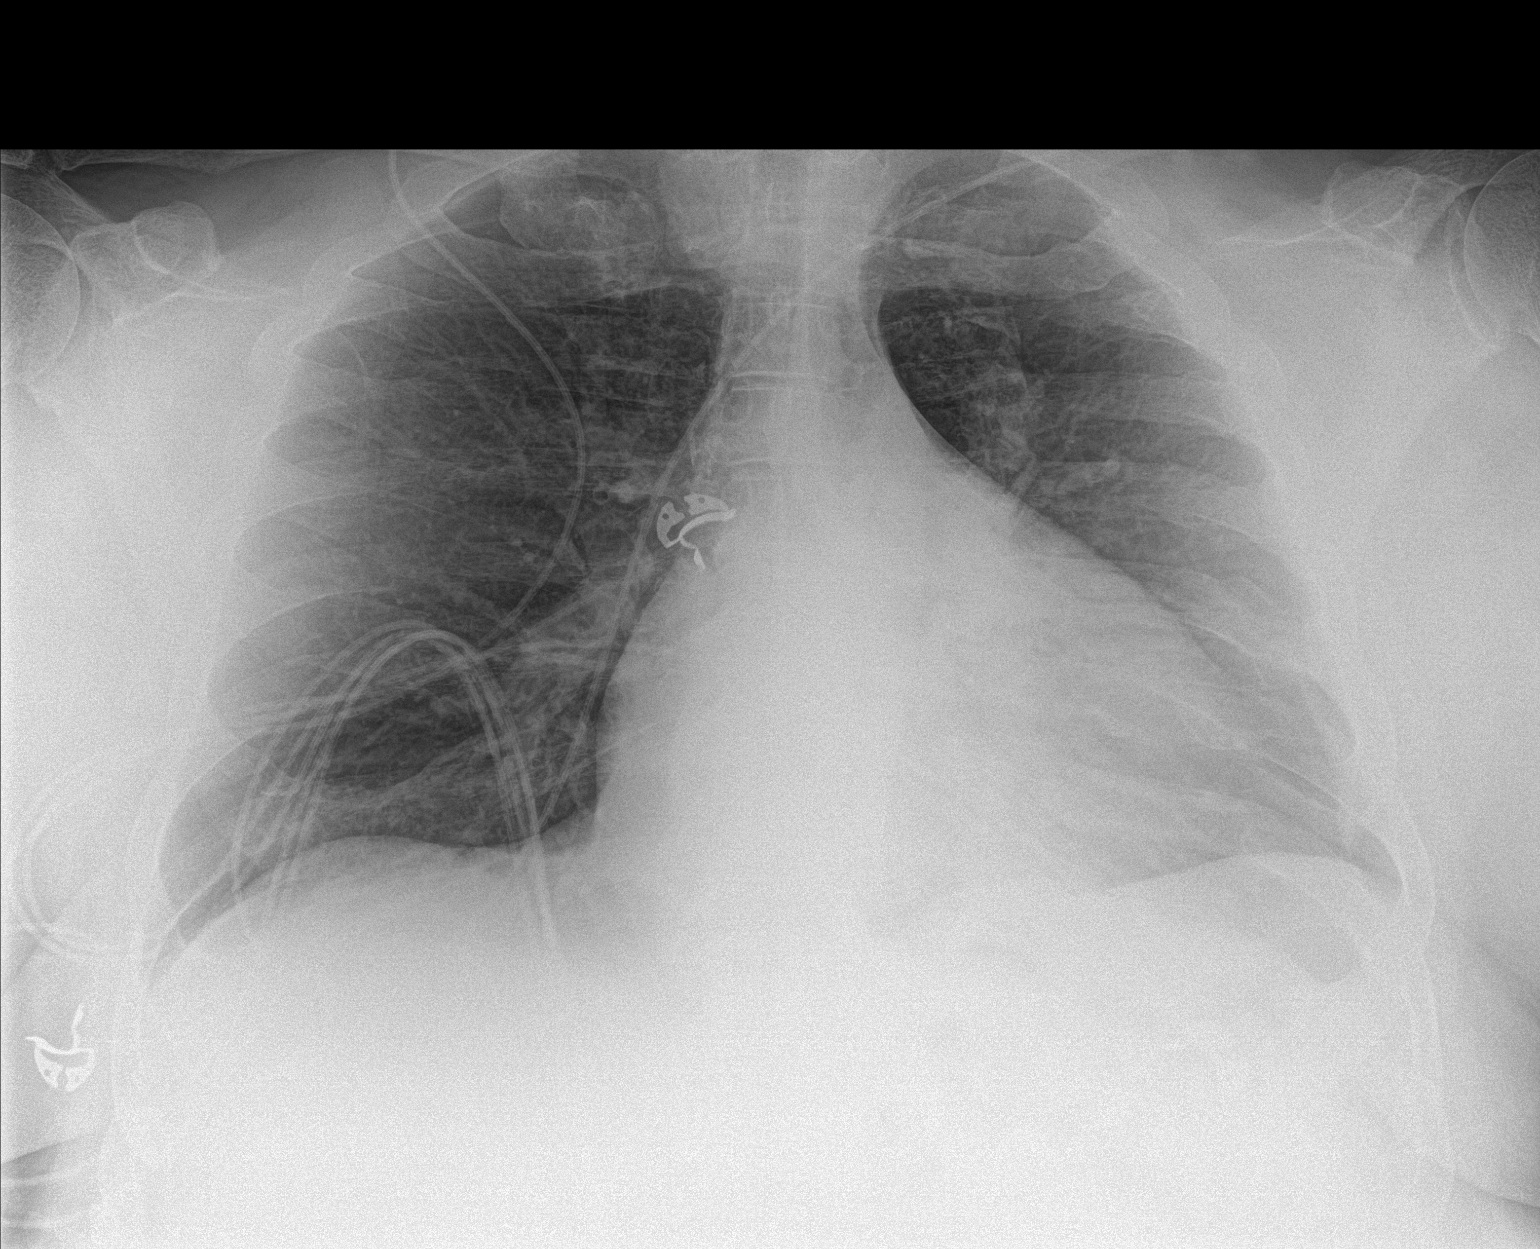

[2 of 2 positions shown; findings below may reference images not displayed]

FINDINGS: There is moderate cardiac enlargement. No pleural effusion or edema.
The lungs are clear. No evidence for granulomatous disease. Chronic
left posterior rib fracture deformities are noted.
IMPRESSION: 1. No evidence for active or prior granulomatous disease.
2. Cardiac enlargement.

## 2017-01-28 DIAGNOSIS — N186 End stage renal disease: Secondary | ICD-10-CM | POA: Diagnosis not present

## 2017-01-28 DIAGNOSIS — Z992 Dependence on renal dialysis: Secondary | ICD-10-CM | POA: Diagnosis not present

## 2017-01-29 DIAGNOSIS — N186 End stage renal disease: Secondary | ICD-10-CM | POA: Diagnosis not present

## 2017-01-29 DIAGNOSIS — Z992 Dependence on renal dialysis: Secondary | ICD-10-CM | POA: Diagnosis not present

## 2017-01-30 DIAGNOSIS — N186 End stage renal disease: Secondary | ICD-10-CM | POA: Diagnosis not present

## 2017-01-30 DIAGNOSIS — Z992 Dependence on renal dialysis: Secondary | ICD-10-CM | POA: Diagnosis not present

## 2017-01-31 DIAGNOSIS — Z992 Dependence on renal dialysis: Secondary | ICD-10-CM | POA: Diagnosis not present

## 2017-01-31 DIAGNOSIS — N186 End stage renal disease: Secondary | ICD-10-CM | POA: Diagnosis not present

## 2017-02-01 DIAGNOSIS — Z992 Dependence on renal dialysis: Secondary | ICD-10-CM | POA: Diagnosis not present

## 2017-02-01 DIAGNOSIS — N186 End stage renal disease: Secondary | ICD-10-CM | POA: Diagnosis not present

## 2017-02-02 DIAGNOSIS — Z992 Dependence on renal dialysis: Secondary | ICD-10-CM | POA: Diagnosis not present

## 2017-02-02 DIAGNOSIS — N186 End stage renal disease: Secondary | ICD-10-CM | POA: Diagnosis not present

## 2017-02-03 DIAGNOSIS — Z992 Dependence on renal dialysis: Secondary | ICD-10-CM | POA: Diagnosis not present

## 2017-02-03 DIAGNOSIS — N186 End stage renal disease: Secondary | ICD-10-CM | POA: Diagnosis not present

## 2017-02-04 DIAGNOSIS — N186 End stage renal disease: Secondary | ICD-10-CM | POA: Diagnosis not present

## 2017-02-04 DIAGNOSIS — Z992 Dependence on renal dialysis: Secondary | ICD-10-CM | POA: Diagnosis not present

## 2017-02-05 DIAGNOSIS — N186 End stage renal disease: Secondary | ICD-10-CM | POA: Diagnosis not present

## 2017-02-05 DIAGNOSIS — Z992 Dependence on renal dialysis: Secondary | ICD-10-CM | POA: Diagnosis not present

## 2017-02-06 DIAGNOSIS — R001 Bradycardia, unspecified: Secondary | ICD-10-CM | POA: Diagnosis not present

## 2017-02-06 DIAGNOSIS — Z992 Dependence on renal dialysis: Secondary | ICD-10-CM | POA: Diagnosis not present

## 2017-02-06 DIAGNOSIS — R0602 Shortness of breath: Secondary | ICD-10-CM | POA: Diagnosis not present

## 2017-02-06 DIAGNOSIS — I10 Essential (primary) hypertension: Secondary | ICD-10-CM | POA: Diagnosis not present

## 2017-02-06 DIAGNOSIS — N186 End stage renal disease: Secondary | ICD-10-CM | POA: Diagnosis not present

## 2017-02-06 DIAGNOSIS — E7800 Pure hypercholesterolemia, unspecified: Secondary | ICD-10-CM | POA: Diagnosis not present

## 2017-02-07 DIAGNOSIS — N186 End stage renal disease: Secondary | ICD-10-CM | POA: Diagnosis not present

## 2017-02-07 DIAGNOSIS — Z992 Dependence on renal dialysis: Secondary | ICD-10-CM | POA: Diagnosis not present

## 2017-02-08 DIAGNOSIS — Z992 Dependence on renal dialysis: Secondary | ICD-10-CM | POA: Diagnosis not present

## 2017-02-08 DIAGNOSIS — N186 End stage renal disease: Secondary | ICD-10-CM | POA: Diagnosis not present

## 2017-02-09 DIAGNOSIS — N186 End stage renal disease: Secondary | ICD-10-CM | POA: Diagnosis not present

## 2017-02-09 DIAGNOSIS — Z992 Dependence on renal dialysis: Secondary | ICD-10-CM | POA: Diagnosis not present

## 2017-02-10 DIAGNOSIS — Z992 Dependence on renal dialysis: Secondary | ICD-10-CM | POA: Diagnosis not present

## 2017-02-10 DIAGNOSIS — N186 End stage renal disease: Secondary | ICD-10-CM | POA: Diagnosis not present

## 2017-02-11 DIAGNOSIS — Z992 Dependence on renal dialysis: Secondary | ICD-10-CM | POA: Diagnosis not present

## 2017-02-11 DIAGNOSIS — N186 End stage renal disease: Secondary | ICD-10-CM | POA: Diagnosis not present

## 2017-02-12 DIAGNOSIS — N186 End stage renal disease: Secondary | ICD-10-CM | POA: Diagnosis not present

## 2017-02-12 DIAGNOSIS — Z992 Dependence on renal dialysis: Secondary | ICD-10-CM | POA: Diagnosis not present

## 2017-02-13 DIAGNOSIS — Z992 Dependence on renal dialysis: Secondary | ICD-10-CM | POA: Diagnosis not present

## 2017-02-13 DIAGNOSIS — N186 End stage renal disease: Secondary | ICD-10-CM | POA: Diagnosis not present

## 2017-02-14 ENCOUNTER — Other Ambulatory Visit: Payer: Self-pay | Admitting: Internal Medicine

## 2017-02-14 DIAGNOSIS — Z992 Dependence on renal dialysis: Secondary | ICD-10-CM | POA: Diagnosis not present

## 2017-02-14 DIAGNOSIS — N186 End stage renal disease: Secondary | ICD-10-CM | POA: Diagnosis not present

## 2017-02-15 DIAGNOSIS — Z992 Dependence on renal dialysis: Secondary | ICD-10-CM | POA: Diagnosis not present

## 2017-02-15 DIAGNOSIS — N186 End stage renal disease: Secondary | ICD-10-CM | POA: Diagnosis not present

## 2017-02-16 DIAGNOSIS — Z992 Dependence on renal dialysis: Secondary | ICD-10-CM | POA: Diagnosis not present

## 2017-02-16 DIAGNOSIS — N186 End stage renal disease: Secondary | ICD-10-CM | POA: Diagnosis not present

## 2017-02-17 DIAGNOSIS — N186 End stage renal disease: Secondary | ICD-10-CM | POA: Diagnosis not present

## 2017-02-17 DIAGNOSIS — Z992 Dependence on renal dialysis: Secondary | ICD-10-CM | POA: Diagnosis not present

## 2017-02-18 ENCOUNTER — Encounter (INDEPENDENT_AMBULATORY_CARE_PROVIDER_SITE_OTHER): Payer: Self-pay

## 2017-02-18 ENCOUNTER — Encounter (INDEPENDENT_AMBULATORY_CARE_PROVIDER_SITE_OTHER): Payer: Self-pay | Admitting: Vascular Surgery

## 2017-02-18 ENCOUNTER — Ambulatory Visit (INDEPENDENT_AMBULATORY_CARE_PROVIDER_SITE_OTHER): Payer: Medicare HMO | Admitting: Vascular Surgery

## 2017-02-18 VITALS — BP 150/70 | HR 52 | Resp 16 | Wt 248.0 lb

## 2017-02-18 DIAGNOSIS — T8090XA Unspecified complication following infusion and therapeutic injection, initial encounter: Secondary | ICD-10-CM | POA: Insufficient documentation

## 2017-02-18 DIAGNOSIS — J45909 Unspecified asthma, uncomplicated: Secondary | ICD-10-CM

## 2017-02-18 DIAGNOSIS — T8090XS Unspecified complication following infusion and therapeutic injection, sequela: Secondary | ICD-10-CM

## 2017-02-18 DIAGNOSIS — E1121 Type 2 diabetes mellitus with diabetic nephropathy: Secondary | ICD-10-CM | POA: Diagnosis not present

## 2017-02-18 DIAGNOSIS — N186 End stage renal disease: Secondary | ICD-10-CM

## 2017-02-18 DIAGNOSIS — I5032 Chronic diastolic (congestive) heart failure: Secondary | ICD-10-CM | POA: Diagnosis not present

## 2017-02-18 DIAGNOSIS — Z992 Dependence on renal dialysis: Secondary | ICD-10-CM | POA: Diagnosis not present

## 2017-02-18 DIAGNOSIS — I10 Essential (primary) hypertension: Secondary | ICD-10-CM | POA: Diagnosis not present
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
## 2017-02-19 ENCOUNTER — Other Ambulatory Visit (INDEPENDENT_AMBULATORY_CARE_PROVIDER_SITE_OTHER): Payer: Self-pay | Admitting: Vascular Surgery

## 2017-02-19 DIAGNOSIS — N186 End stage renal disease: Secondary | ICD-10-CM | POA: Diagnosis not present

## 2017-02-19 DIAGNOSIS — Z992 Dependence on renal dialysis: Secondary | ICD-10-CM | POA: Diagnosis not present

## 2017-02-20 DIAGNOSIS — Z992 Dependence on renal dialysis: Secondary | ICD-10-CM | POA: Diagnosis not present

## 2017-02-20 DIAGNOSIS — N186 End stage renal disease: Secondary | ICD-10-CM | POA: Diagnosis not present

## 2017-02-21 ENCOUNTER — Encounter
Admission: RE | Admit: 2017-02-21 | Discharge: 2017-02-21 | Disposition: A | Discharge status: Home / Self Care | Payer: Medicare HMO | Source: Ambulatory Visit | Attending: Vascular Surgery | Admitting: Vascular Surgery

## 2017-02-21 ENCOUNTER — Other Ambulatory Visit: Payer: Self-pay | Admitting: Internal Medicine

## 2017-02-21 DIAGNOSIS — I10 Essential (primary) hypertension: Secondary | ICD-10-CM | POA: Diagnosis not present

## 2017-02-21 DIAGNOSIS — N186 End stage renal disease: Secondary | ICD-10-CM | POA: Insufficient documentation

## 2017-02-21 DIAGNOSIS — Z992 Dependence on renal dialysis: Secondary | ICD-10-CM | POA: Diagnosis not present

## 2017-02-21 DIAGNOSIS — R001 Bradycardia, unspecified: Secondary | ICD-10-CM | POA: Insufficient documentation

## 2017-02-21 DIAGNOSIS — Z01812 Encounter for preprocedural laboratory examination: Secondary | ICD-10-CM | POA: Diagnosis not present

## 2017-02-21 DIAGNOSIS — Z01810 Encounter for preprocedural cardiovascular examination: Secondary | ICD-10-CM | POA: Diagnosis not present

## 2017-02-21 LAB — TYPE AND SCREEN
ABO/RH(D): O POS
ANTIBODY SCREEN: NEGATIVE

## 2017-02-21 LAB — APTT: APTT: 31 s (ref 24–36)

## 2017-02-21 LAB — CBC WITH DIFFERENTIAL/PLATELET
BASOS ABS: 0 10*3/uL (ref 0–0.1)
Basophils Relative: 1 %
EOS ABS: 0.1 10*3/uL (ref 0–0.7)
EOS PCT: 3 %
HCT: 36.8 % — ABNORMAL LOW (ref 40.0–52.0)
Hemoglobin: 11.9 g/dL — ABNORMAL LOW (ref 13.0–18.0)
LYMPHS ABS: 1.5 10*3/uL (ref 1.0–3.6)
Lymphocytes Relative: 34 %
MCH: 28.6 pg (ref 26.0–34.0)
MCHC: 32.3 g/dL (ref 32.0–36.0)
MCV: 88.7 fL (ref 80.0–100.0)
MONO ABS: 0.5 10*3/uL (ref 0.2–1.0)
Monocytes Relative: 12 %
Neutro Abs: 2.2 10*3/uL (ref 1.4–6.5)
Neutrophils Relative %: 50 %
PLATELETS: 134 10*3/uL — AB (ref 150–440)
RBC: 4.15 MIL/uL — AB (ref 4.40–5.90)
RDW: 17.9 % — AB (ref 11.5–14.5)
WBC: 4.4 10*3/uL (ref 3.8–10.6)

## 2017-02-21 LAB — BASIC METABOLIC PANEL
Anion gap: 9 (ref 5–15)
BUN: 77 mg/dL — AB (ref 6–20)
CO2: 25 mmol/L (ref 22–32)
CREATININE: 5.16 mg/dL — AB (ref 0.61–1.24)
Calcium: 9 mg/dL (ref 8.9–10.3)
Chloride: 105 mmol/L (ref 101–111)
GFR calc Af Amer: 12 mL/min — ABNORMAL LOW (ref >60)
GFR, EST NON AFRICAN AMERICAN: 11 mL/min — AB (ref >60)
GLUCOSE: 96 mg/dL (ref 65–99)
Potassium: 4.2 mmol/L (ref 3.5–5.1)
SODIUM: 139 mmol/L (ref 135–145)

## 2017-02-21 LAB — SURGICAL PCR SCREEN
MRSA, PCR: NEGATIVE
Staphylococcus aureus: NEGATIVE

## 2017-02-21 LAB — PROTIME-INR
INR: 0.98
PROTHROMBIN TIME: 13 s (ref 11.4–15.2)

## 2017-02-21 NOTE — Patient Instructions (Signed)
Your procedure is scheduled on: Wednesday 02/27/17 Report to [facility]. 2ND FLOOR MEDICAL MALL ENTRANCE. To find out your arrival time please call [phone] between 1PM - 3PM on Tuesday 02/26/17.  Remember: Instructions that are not followed completely may result in serious medical risk, up to and including death, or upon the discretion of your surgeon and anesthesiologist your surgery may need to be rescheduled.    __X__ 1. Do not eat food or drink liquids after midnight. No gum chewing or hard candies.     __X__ 2. No Alcohol for 24 hours before or after surgery.   ____ 3. Bring all medications with you on the day of surgery if instructed.    __X__ 4. Notify your doctor if there is any change in your medical condition     (cold, fever, infections).             __X___5. No smoking within 24 hours of your surgery.     Do not wear jewelry, make-up, hairpins, clips or nail polish.  Do not wear lotions, powders, or perfumes.   Do not shave 48 hours prior to surgery. Men may shave face and neck.  Do not bring valuables to the hospital.    [facility] is not responsible for any belongings or valuables.               Contacts, dentures or bridgework may not be worn into surgery.  Leave your suitcase in the car. After surgery it may be brought to your room.  For patients admitted to the hospital, discharge time is determined by your                treatment team.   Patients discharged the day of surgery will not be allowed to drive home.   Please read over the following fact sheets that you were given:   Pain Booklet and MRSA Information   __X__ Take these medicines the morning of surgery with A SIP OF WATER:    1. CITALOPRAM  2. FEXOFENADINE  3. LIPITOR  4.LOSARTAN  5. METOPROLOL  6. NORVASC  7. TAMSULOSIN  ____ Fleet Enema (as directed)   __X__ Use CHG Soap as directed  ____ Use inhalers on the day of surgery  ____ Stop metformin 2 days prior to surgery    ____ Take 1/2  of usual insulin dose the night before surgery and none on the morning of surgery.   ____ Stop Coumadin/Plavix/aspirin on   __X__ Stop Anti-inflammatories such as Advil, Aleve, Ibuprofen, Motrin, Naproxen, Naprosyn, Goodies,powder, or aspirin products.  OK to take Tylenol.   ____ Stop supplements until after surgery.    ____ Bring C-Pap to the hospital.

## 2017-02-22 DIAGNOSIS — N186 End stage renal disease: Secondary | ICD-10-CM | POA: Diagnosis not present

## 2017-02-22 DIAGNOSIS — Z992 Dependence on renal dialysis: Secondary | ICD-10-CM | POA: Diagnosis not present

## 2017-02-23 DIAGNOSIS — Z992 Dependence on renal dialysis: Secondary | ICD-10-CM | POA: Diagnosis not present

## 2017-02-23 DIAGNOSIS — N186 End stage renal disease: Secondary | ICD-10-CM | POA: Diagnosis not present

## 2017-02-24 DIAGNOSIS — Z992 Dependence on renal dialysis: Secondary | ICD-10-CM | POA: Diagnosis not present

## 2017-02-24 DIAGNOSIS — N186 End stage renal disease: Secondary | ICD-10-CM | POA: Diagnosis not present

## 2017-02-25 DIAGNOSIS — N186 End stage renal disease: Secondary | ICD-10-CM | POA: Diagnosis not present

## 2017-02-25 DIAGNOSIS — Z992 Dependence on renal dialysis: Secondary | ICD-10-CM | POA: Diagnosis not present

## 2017-02-26 DIAGNOSIS — Z992 Dependence on renal dialysis: Secondary | ICD-10-CM | POA: Diagnosis not present

## 2017-02-26 DIAGNOSIS — N186 End stage renal disease: Secondary | ICD-10-CM | POA: Diagnosis not present

## 2017-02-27 DIAGNOSIS — Z992 Dependence on renal dialysis: Secondary | ICD-10-CM | POA: Diagnosis not present

## 2017-02-27 DIAGNOSIS — N186 End stage renal disease: Secondary | ICD-10-CM | POA: Diagnosis not present

## 2017-02-28 DIAGNOSIS — Z992 Dependence on renal dialysis: Secondary | ICD-10-CM | POA: Diagnosis not present

## 2017-02-28 DIAGNOSIS — N186 End stage renal disease: Secondary | ICD-10-CM | POA: Diagnosis not present

## 2017-02-28 MED ORDER — CEFAZOLIN SODIUM-DEXTROSE 2-4 GM/100ML-% IV SOLN
2.0000 g | INTRAVENOUS | Status: AC
  Administered 2017-03-01: 2 g via INTRAVENOUS

## 2017-03-01 ENCOUNTER — Ambulatory Visit: Payer: Medicare HMO | Admitting: Anesthesiology

## 2017-03-01 ENCOUNTER — Encounter
Admission: RE | Disposition: A | Discharge status: Home / Self Care | Payer: Self-pay | Source: Ambulatory Visit | Attending: Vascular Surgery

## 2017-03-01 ENCOUNTER — Ambulatory Visit
Admission: RE | Admit: 2017-03-01 | Discharge: 2017-03-01 | Disposition: A | Discharge status: Home / Self Care | Payer: Medicare HMO | Source: Ambulatory Visit | Attending: Vascular Surgery | Admitting: Vascular Surgery

## 2017-03-01 ENCOUNTER — Encounter: Payer: Self-pay | Admitting: Anesthesiology

## 2017-03-01 DIAGNOSIS — Z79899 Other long term (current) drug therapy: Secondary | ICD-10-CM | POA: Insufficient documentation

## 2017-03-01 DIAGNOSIS — I120 Hypertensive chronic kidney disease with stage 5 chronic kidney disease or end stage renal disease: Secondary | ICD-10-CM | POA: Insufficient documentation

## 2017-03-01 DIAGNOSIS — I132 Hypertensive heart and chronic kidney disease with heart failure and with stage 5 chronic kidney disease, or end stage renal disease: Secondary | ICD-10-CM | POA: Diagnosis not present

## 2017-03-01 DIAGNOSIS — E1122 Type 2 diabetes mellitus with diabetic chronic kidney disease: Secondary | ICD-10-CM | POA: Insufficient documentation

## 2017-03-01 DIAGNOSIS — K439 Ventral hernia without obstruction or gangrene: Secondary | ICD-10-CM | POA: Diagnosis not present

## 2017-03-01 DIAGNOSIS — N186 End stage renal disease: Secondary | ICD-10-CM | POA: Diagnosis not present

## 2017-03-01 DIAGNOSIS — E785 Hyperlipidemia, unspecified: Secondary | ICD-10-CM | POA: Insufficient documentation

## 2017-03-01 DIAGNOSIS — Z992 Dependence on renal dialysis: Secondary | ICD-10-CM | POA: Diagnosis not present

## 2017-03-01 DIAGNOSIS — Z7984 Long term (current) use of oral hypoglycemic drugs: Secondary | ICD-10-CM | POA: Insufficient documentation

## 2017-03-01 DIAGNOSIS — Z7951 Long term (current) use of inhaled steroids: Secondary | ICD-10-CM | POA: Insufficient documentation

## 2017-03-01 DIAGNOSIS — Z8614 Personal history of Methicillin resistant Staphylococcus aureus infection: Secondary | ICD-10-CM | POA: Diagnosis not present

## 2017-03-01 HISTORY — PX: AV FISTULA PLACEMENT: SHX1204

## 2017-03-01 LAB — POCT I-STAT 4, (NA,K, GLUC, HGB,HCT)
Glucose, Bld: 121 mg/dL — ABNORMAL HIGH (ref 65–99)
HCT: 35 % — ABNORMAL LOW (ref 39.0–52.0)
HEMOGLOBIN: 11.9 g/dL — AB (ref 13.0–17.0)
Potassium: 4 mmol/L (ref 3.5–5.1)
Sodium: 139 mmol/L (ref 135–145)

## 2017-03-01 LAB — GLUCOSE, CAPILLARY
GLUCOSE-CAPILLARY: 113 mg/dL — AB (ref 65–99)
Glucose-Capillary: 138 mg/dL — ABNORMAL HIGH (ref 65–99)

## 2017-03-01 SURGERY — INSERTION OF ARTERIOVENOUS (AV) GORE-TEX GRAFT ARM
Anesthesia: General | Laterality: Right | Wound class: Clean

## 2017-03-01 MED ORDER — LIDOCAINE HCL (PF) 1 % IJ SOLN
INTRAMUSCULAR | Status: AC
  Filled 2017-03-01: qty 5

## 2017-03-01 MED ORDER — PROPOFOL 10 MG/ML IV BOLUS
INTRAVENOUS | Status: DC | PRN
  Administered 2017-03-01: 50 mg via INTRAVENOUS

## 2017-03-01 MED ORDER — PROPOFOL 500 MG/50ML IV EMUL
INTRAVENOUS | Status: AC
  Filled 2017-03-01: qty 50

## 2017-03-01 MED ORDER — PROPOFOL 500 MG/50ML IV EMUL
INTRAVENOUS | Status: DC | PRN
  Administered 2017-03-01: 100 ug/kg/min via INTRAVENOUS

## 2017-03-01 MED ORDER — ONDANSETRON HCL 4 MG/2ML IJ SOLN: 4.0000 mg | Freq: Once | INTRAMUSCULAR | Status: DC | PRN

## 2017-03-01 MED ORDER — FENTANYL CITRATE (PF) 100 MCG/2ML IJ SOLN
50.0000 ug | Freq: Once | INTRAMUSCULAR | Status: AC
  Administered 2017-03-01: 50 ug via INTRAVENOUS

## 2017-03-01 MED ORDER — SODIUM CHLORIDE 0.9 % IV SOLN
INTRAVENOUS | Status: DC | PRN
  Administered 2017-03-01: 40 mL via INTRAMUSCULAR

## 2017-03-01 MED ORDER — GLYCOPYRROLATE 0.2 MG/ML IJ SOLN
INTRAMUSCULAR | Status: DC | PRN
  Administered 2017-03-01: 0.2 mg via INTRAVENOUS

## 2017-03-01 MED ORDER — HEPARIN SODIUM (PORCINE) 5000 UNIT/ML IJ SOLN
INTRAMUSCULAR | Status: AC
  Filled 2017-03-01: qty 1

## 2017-03-01 MED ORDER — BUPIVACAINE HCL (PF) 0.5 % IJ SOLN
INTRAMUSCULAR | Status: AC
  Filled 2017-03-01: qty 30

## 2017-03-01 MED ORDER — CHLORHEXIDINE GLUCONATE CLOTH 2 % EX PADS: 6.0000 | MEDICATED_PAD | Freq: Once | CUTANEOUS | Status: DC

## 2017-03-01 MED ORDER — FENTANYL CITRATE (PF) 100 MCG/2ML IJ SOLN: 25.0000 ug | INTRAMUSCULAR | Status: DC | PRN

## 2017-03-01 MED ORDER — FENTANYL CITRATE (PF) 100 MCG/2ML IJ SOLN: 50.0000 ug | Freq: Once | INTRAMUSCULAR | Status: DC

## 2017-03-01 MED ORDER — ROPIVACAINE HCL 5 MG/ML IJ SOLN
INTRAMUSCULAR | Status: DC | PRN
  Administered 2017-03-01: 30 mL via PERINEURAL

## 2017-03-01 MED ORDER — GLYCOPYRROLATE 0.2 MG/ML IJ SOLN
INTRAMUSCULAR | Status: AC
  Filled 2017-03-01: qty 1

## 2017-03-01 MED ORDER — ROPIVACAINE HCL 5 MG/ML IJ SOLN
INTRAMUSCULAR | Status: AC
  Filled 2017-03-01: qty 30

## 2017-03-01 MED ORDER — FENTANYL CITRATE (PF) 100 MCG/2ML IJ SOLN
INTRAMUSCULAR | Status: AC
  Administered 2017-03-01: 50 ug via INTRAVENOUS
  Filled 2017-03-01: qty 2

## 2017-03-01 MED ORDER — BUPIVACAINE HCL (PF) 0.5 % IJ SOLN
INTRAMUSCULAR | Status: DC | PRN
  Administered 2017-03-01: 8 mL

## 2017-03-01 MED ORDER — MIDAZOLAM HCL 2 MG/2ML IJ SOLN: 1.0000 mg | Freq: Once | INTRAMUSCULAR | Status: DC

## 2017-03-01 MED ORDER — FAMOTIDINE 20 MG PO TABS
20.0000 mg | ORAL_TABLET | Freq: Once | ORAL | Status: AC
  Administered 2017-03-01: 20 mg via ORAL

## 2017-03-01 MED ORDER — PHENYLEPHRINE HCL 10 MG/ML IJ SOLN
INTRAMUSCULAR | Status: DC | PRN
  Administered 2017-03-01: 30 ug/min via INTRAVENOUS

## 2017-03-01 MED ORDER — CHLORHEXIDINE GLUCONATE CLOTH 2 % EX PADS
6.0000 | MEDICATED_PAD | Freq: Once | CUTANEOUS | Status: AC
  Administered 2017-03-01: 6 via TOPICAL

## 2017-03-01 MED ORDER — LIDOCAINE HCL (PF) 1 % IJ SOLN
INTRAMUSCULAR | Status: DC | PRN
  Administered 2017-03-01: 5 mL via SUBCUTANEOUS

## 2017-03-01 MED ORDER — SODIUM CHLORIDE 0.9 % IV SOLN
INTRAVENOUS | Status: DC
  Administered 2017-03-01 (×2): via INTRAVENOUS

## 2017-03-01 MED ORDER — CEFAZOLIN SODIUM-DEXTROSE 2-4 GM/100ML-% IV SOLN
INTRAVENOUS | Status: AC
  Filled 2017-03-01: qty 100

## 2017-03-01 MED ORDER — PAPAVERINE HCL 30 MG/ML IJ SOLN
INTRAMUSCULAR | Status: AC
  Filled 2017-03-01: qty 2

## 2017-03-01 MED ORDER — FAMOTIDINE 20 MG PO TABS
ORAL_TABLET | ORAL | Status: AC
  Administered 2017-03-01: 20 mg via ORAL
  Filled 2017-03-01: qty 1

## 2017-03-01 MED ORDER — MIDAZOLAM HCL 2 MG/2ML IJ SOLN
INTRAMUSCULAR | Status: AC
  Administered 2017-03-01: 1 mg via INTRAVENOUS
  Filled 2017-03-01: qty 2

## 2017-03-01 MED ORDER — MIDAZOLAM HCL 2 MG/2ML IJ SOLN
1.0000 mg | Freq: Once | INTRAMUSCULAR | Status: AC
  Administered 2017-03-01: 1 mg via INTRAVENOUS

## 2017-03-01 SURGICAL SUPPLY — 57 items
APPLIER CLIP 11 MED OPEN (CLIP)
APPLIER CLIP 9.375 SM OPEN (CLIP)
BAG COUNTER SPONGE EZ (MISCELLANEOUS) IMPLANT
BAG DECANTER FOR FLEXI CONT (MISCELLANEOUS) ×2 IMPLANT
BLADE SURG SZ11 CARB STEEL (BLADE) ×2 IMPLANT
BOOT SUTURE AID YELLOW STND (SUTURE) ×2 IMPLANT
BRUSH SCRUB 4% CHG (MISCELLANEOUS) ×2 IMPLANT
CANISTER SUCT 1200ML W/VALVE (MISCELLANEOUS) ×2 IMPLANT
CHLORAPREP W/TINT 26ML (MISCELLANEOUS) ×2 IMPLANT
CLIP APPLIE 11 MED OPEN (CLIP) IMPLANT
CLIP APPLIE 9.375 SM OPEN (CLIP) IMPLANT
DERMABOND ADVANCED (GAUZE/BANDAGES/DRESSINGS) ×1
DERMABOND ADVANCED .7 DNX12 (GAUZE/BANDAGES/DRESSINGS) ×1 IMPLANT
DRESSING SURGICEL FIBRLLR 1X2 (HEMOSTASIS) ×2 IMPLANT
DRSG SURGICEL FIBRILLAR 1X2 (HEMOSTASIS) ×4
ELECT CAUTERY BLADE 6.4 (BLADE) ×2 IMPLANT
ELECT REM PT RETURN 9FT ADLT (ELECTROSURGICAL) ×2
ELECTRODE REM PT RTRN 9FT ADLT (ELECTROSURGICAL) ×1 IMPLANT
GLOVE BIO SURGEON STRL SZ7 (GLOVE) ×12 IMPLANT
GLOVE INDICATOR 7.5 STRL GRN (GLOVE) ×2 IMPLANT
GLOVE SURG SYN 8.0 (GLOVE) ×2 IMPLANT
GOWN STRL REUS W/ TWL LRG LVL3 (GOWN DISPOSABLE) ×2 IMPLANT
GOWN STRL REUS W/ TWL XL LVL3 (GOWN DISPOSABLE) ×1 IMPLANT
GOWN STRL REUS W/TWL LRG LVL3 (GOWN DISPOSABLE) ×2
GOWN STRL REUS W/TWL XL LVL3 (GOWN DISPOSABLE) ×1
GRAFT PROPATEN STD WALL 4 7X45 (Vascular Products) ×2 IMPLANT
IV NS 500ML (IV SOLUTION) ×1
IV NS 500ML BAXH (IV SOLUTION) ×1 IMPLANT
KIT RM TURNOVER STRD PROC AR (KITS) ×2 IMPLANT
LABEL OR SOLS (LABEL) ×2 IMPLANT
LOOP RED MAXI  1X406MM (MISCELLANEOUS) ×1
LOOP VESSEL MAXI 1X406 RED (MISCELLANEOUS) ×1 IMPLANT
LOOP VESSEL MINI 0.8X406 BLUE (MISCELLANEOUS) ×1 IMPLANT
LOOPS BLUE MINI 0.8X406MM (MISCELLANEOUS) ×1
NEEDLE FILTER BLUNT 18X 1/2SAF (NEEDLE) ×1
NEEDLE FILTER BLUNT 18X1 1/2 (NEEDLE) ×1 IMPLANT
NS IRRIG 500ML POUR BTL (IV SOLUTION) ×2 IMPLANT
PACK EXTREMITY ARMC (MISCELLANEOUS) ×2 IMPLANT
PAD PREP 24X41 OB/GYN DISP (PERSONAL CARE ITEMS) IMPLANT
PUNCH SURGICAL ROTATE 2.7MM (MISCELLANEOUS) IMPLANT
SPONGE XRAY 4X4 16PLY STRL (MISCELLANEOUS) ×2 IMPLANT
STOCKINETTE STRL 4IN 9604848 (GAUZE/BANDAGES/DRESSINGS) ×2 IMPLANT
SUT GTX CV-6 30 (SUTURE) ×4 IMPLANT
SUT MNCRL+ 5-0 UNDYED PC-3 (SUTURE) ×1 IMPLANT
SUT MONOCRYL 5-0 (SUTURE) ×1
SUT PROLENE 6 0 BV (SUTURE) ×4 IMPLANT
SUT SILK 2 0 (SUTURE) ×1
SUT SILK 2 0 SH (SUTURE) ×2 IMPLANT
SUT SILK 2-0 18XBRD TIE 12 (SUTURE) ×1 IMPLANT
SUT SILK 3 0 (SUTURE) ×1
SUT SILK 3-0 18XBRD TIE 12 (SUTURE) ×1 IMPLANT
SUT SILK 4 0 (SUTURE) ×1
SUT SILK 4-0 18XBRD TIE 12 (SUTURE) ×1 IMPLANT
SUT VIC AB 3-0 SH 27 (SUTURE) ×2
SUT VIC AB 3-0 SH 27X BRD (SUTURE) ×2 IMPLANT
SYR 20CC LL (SYRINGE) ×2 IMPLANT
SYR 3ML LL SCALE MARK (SYRINGE) ×2 IMPLANT

## 2017-03-01 NOTE — Discharge Instructions (Signed)

## 2017-03-01 NOTE — Anesthesia Procedure Notes (Signed)
Anesthesia Regional Block: Interscalene brachial plexus block   Pre-Anesthetic Checklist: ,, timeout performed, Correct Patient, Correct Site, Correct Laterality, Correct Procedure, Correct Position, site marked, Risks and benefits discussed,  Surgical consent,  Pre-op evaluation,  At surgeon's request and post-op pain management  Laterality: Right and Upper  Prep: chloraprep       Needles:  Injection technique: Single-shot  Needle Type: Echogenic Stimulator Needle     Needle Length: 10cm  Needle Gauge: 22     Additional Needles:   Procedures: ultrasound guided,,,,,,,,  Narrative:  Start time: 03/01/2017 11:28 AM End time: 03/01/2017 11:32 AM Injection made incrementally with aspirations every 5 mL.  Performed by: Personally  Anesthesiologist: Martha Clan  Additional Notes: Functioning IV was confirmed and monitors were applied.  A 19mm 22ga Stimuplex needle was used. Sterile prep and drape,hand hygiene and sterile gloves were used.  Negative aspiration and negative test dose prior to incremental administration of local anesthetic. The patient tolerated the procedure well.

## 2017-03-01 NOTE — Anesthesia Procedure Notes (Signed)
Date/Time: 03/01/2017 12:16 PM Performed by: Nelda Marseille Pre-anesthesia Checklist: Patient identified, Emergency Drugs available, Suction available, Patient being monitored and Timeout performed Oxygen Delivery Method: Simple face mask

## 2017-03-01 NOTE — H&P (Signed)
[facility] History & Physical Update  The patient was interviewed and re-examined.  The patient's previous History and Physical has been reviewed and is unchanged.  There is no change in the plan of care. We plan to proceed with the scheduled procedure.  Hortencia Pilar, MD  03/01/2017, 11:00 AM

## 2017-03-01 NOTE — Transfer of Care (Signed)
Immediate Anesthesia Transfer of Care Note  Patient: Richard Chen  Procedure(s) Performed: Procedure(s): INSERTION OF ARTERIOVENOUS (AV) GORE-TEX GRAFT ARM (Right)  Patient Location: PACU  Anesthesia Type:General  Level of Consciousness: sedated  Airway & Oxygen Therapy: Patient Spontanous Breathing and Patient connected to face mask oxygen  Post-op Assessment: Report given to RN and Post -op Vital signs reviewed and stable  Post vital signs: Reviewed and stable  Last Vitals:  Vitals:   03/01/17 1129 03/01/17 1134  BP: (!) 167/72 (!) 164/78  Pulse: (!) 51 (!) 49  Resp: 16 16  Temp:      Last Pain:  Vitals:   03/01/17 1003  TempSrc: Oral         Complications: No apparent anesthesia complications

## 2017-03-01 NOTE — Op Note (Signed)
OPERATIVE NOTE   PROCEDURE: right brachial axillary arteriovenous graft placement  PRE-OPERATIVE DIAGNOSIS: End Stage Renal Disease  POST-OPERATIVE DIAGNOSIS: End Stage Renal Disease  SURGEON: Hortencia Pilar  ASSISTANT(S): Ms Kaleen Mask  ANESTHESIA: general  ESTIMATED BLOOD LOSS: <50 cc  FINDING(S): Small artery and vein  SPECIMEN(S):  none  INDICATIONS:   Richard Chen is a 61 y.o. male who presents with end stage renal disease.  The patient is scheduled for right brachial axillary AV graft placement.  The patient is aware the risks include but are not limited to: bleeding, infection, steal syndrome, nerve damage, ischemic monomelic neuropathy, failure to mature, and need for additional procedures.  The patient is aware of the risks of the procedure and elects to proceed forward.  DESCRIPTION: After full informed written consent was obtained from the patient, the patient was brought back to the operating room and placed supine upon the operating table.  Prior to induction, the patient received IV antibiotics.   After obtaining adequate anesthesia, the patient was then prepped and draped in the standard fashion for a right arm access procedure.    A linear incision was then created along the medial border of the biceps muscle just proximal to the antecubital crease and the brachial artery which was exposed through. The brachial artery was then looped proximally and distally with Silastic Vesseloops. Side branches were controlled with 4-0 silk ties.  Attention was then turned to the exposure of the axillary vein. Linear incision was then created medial to the proximal portion of the biceps at the level of the anterior axillary crease. The axillary vein was exposed and again looped proximally and distally with Silastic vessel loops. Associated tributaries were also controlled with Silastic Vesseloops.  The Gore tunneler was then delivered onto the field and a subcutaneous  path was made from the arterial incision to the venous incision. A 4-7 tapered PTFE propatent graft by Simeon Craft was then pulled through the subcutaneous tunnel. The arterial 4 mm portion was then approximated to the brachial artery. Brachial artery was controlled proximally and distally with the Silastic Vesseloops. Arteriotomy was made with an 11 blade scalpel and extended with Potts scissors and a 6-0 Prolene stay suture was placed. End graft to side brachial artery anastomosis was then fashioned with running CV 6 suture. Flushing maneuvers were performed suture line was hemostatic and the graft was then assessed for proper position and ease of future cannulation. Heparinized saline was infused into the vein and the graft was clamped with a vascular clamp. With the graft pressurized it was approximated to the axillary vein in its native bed and then marked with a surgical marker. The vein was then delivered into the surgical field and controlled with the Silastic vessel loops. Venotomy was then made with an 11 blade scalpel and extended with Potts scissors and a 6-0 Prolene suture was used as stay suture. The the graft was then sewn to the vein in an end graft to side vein fashion using running CV 6 suture.  Flushing maneuvers were performed and the artery was allowed to forward and back bleed.  Flow was then established through the AV graft  There was good  thrill in the venous outflow, and there was 1+ palpable radial pulse.  At this point, I irrigated out the surgical wounds.  There was no further active bleeding.  The subcutaneous tissue was reapproximated with a running stitch of 3-0 Vicryl.  The skin was then reapproximated with a  running subcuticular stitch of 4-0 Vicryl.  The skin was then cleaned, dried, and reinforced with Dermabond.    The patient tolerated this procedure well.   COMPLICATIONS: None  CONDITION: [name]  Office:  [phone]   03/01/2017, 1:43 PM

## 2017-03-01 NOTE — Anesthesia Post-op Follow-up Note (Cosign Needed)
Anesthesia QCDR form completed.        
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
## 2017-03-02 DIAGNOSIS — Z992 Dependence on renal dialysis: Secondary | ICD-10-CM | POA: Diagnosis not present

## 2017-03-02 DIAGNOSIS — N186 End stage renal disease: Secondary | ICD-10-CM | POA: Diagnosis not present

## 2017-03-03 DIAGNOSIS — Z992 Dependence on renal dialysis: Secondary | ICD-10-CM | POA: Diagnosis not present

## 2017-03-03 DIAGNOSIS — N186 End stage renal disease: Secondary | ICD-10-CM | POA: Diagnosis not present

## 2017-03-03 NOTE — Anesthesia Postprocedure Evaluation (Signed)
Anesthesia Post Note  Patient: Richard Chen  Procedure(s) Performed: Procedure(s) (LRB): INSERTION OF ARTERIOVENOUS (AV) GORE-TEX GRAFT ARM (Right)  Patient location during evaluation: PACU Anesthesia Type: General Level of consciousness: awake and alert Pain management: pain level controlled Vital Signs Assessment: post-procedure vital signs reviewed and stable Respiratory status: spontaneous breathing, nonlabored ventilation, respiratory function stable and patient connected to nasal cannula oxygen Cardiovascular status: blood pressure returned to baseline and stable Postop Assessment: no signs of nausea or vomiting Anesthetic complications: no     Last Vitals:  Vitals:   03/01/17 1445 03/01/17 1500  BP: (!) 148/67 (!) 149/59  Pulse: (!) 57 (!) 59  Resp:    Temp:      Last Pain:  Vitals:   03/01/17 1003  TempSrc: Oral                 Martha Clan

## 2017-03-04 DIAGNOSIS — N186 End stage renal disease: Secondary | ICD-10-CM | POA: Diagnosis not present

## 2017-03-04 DIAGNOSIS — Z992 Dependence on renal dialysis: Secondary | ICD-10-CM | POA: Diagnosis not present

## 2017-03-05 DIAGNOSIS — N186 End stage renal disease: Secondary | ICD-10-CM | POA: Diagnosis not present

## 2017-03-05 DIAGNOSIS — Z992 Dependence on renal dialysis: Secondary | ICD-10-CM | POA: Diagnosis not present

## 2017-03-06 ENCOUNTER — Encounter: Payer: Self-pay | Admitting: Internal Medicine

## 2017-03-06 ENCOUNTER — Ambulatory Visit (INDEPENDENT_AMBULATORY_CARE_PROVIDER_SITE_OTHER): Payer: Medicare HMO | Admitting: Internal Medicine

## 2017-03-06 VITALS — BP 120/66 | HR 57 | Temp 97.9°F | Wt 241.0 lb

## 2017-03-06 DIAGNOSIS — I5032 Chronic diastolic (congestive) heart failure: Secondary | ICD-10-CM

## 2017-03-06 DIAGNOSIS — N186 End stage renal disease: Secondary | ICD-10-CM | POA: Diagnosis not present

## 2017-03-06 DIAGNOSIS — F39 Unspecified mood [affective] disorder: Secondary | ICD-10-CM | POA: Diagnosis not present

## 2017-03-06 DIAGNOSIS — Z992 Dependence on renal dialysis: Secondary | ICD-10-CM | POA: Diagnosis not present

## 2017-03-06 DIAGNOSIS — E1121 Type 2 diabetes mellitus with diabetic nephropathy: Secondary | ICD-10-CM

## 2017-03-06 DIAGNOSIS — Z0000 Encounter for general adult medical examination without abnormal findings: Secondary | ICD-10-CM | POA: Diagnosis not present

## 2017-03-06 DIAGNOSIS — Z23 Encounter for immunization: Secondary | ICD-10-CM | POA: Diagnosis not present

## 2017-03-06 DIAGNOSIS — S069X0S Unspecified intracranial injury without loss of consciousness, sequela: Secondary | ICD-10-CM

## 2017-03-06 LAB — LIPID PANEL
CHOLESTEROL: 152 mg/dL (ref 0–200)
HDL: 46.7 mg/dL (ref >39.00)
LDL Cholesterol: 69 mg/dL (ref 0–99)
NonHDL: 105.58
TRIGLYCERIDES: 182 mg/dL — AB (ref 0.0–149.0)
Total CHOL/HDL Ratio: 3
VLDL: 36.4 mg/dL (ref 0.0–40.0)

## 2017-03-06 LAB — HM DIABETES FOOT EXAM

## 2017-03-06 LAB — HEMOGLOBIN A1C: Hgb A1c MFr Bld: 6.3 % (ref 4.6–6.5)
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
## 2017-03-06 NOTE — Assessment & Plan Note (Signed)
I have personally reviewed the Medicare Annual Wellness questionnaire and have noted 1. The patient's medical and social history 2. Their use of alcohol, tobacco or illicit drugs 3. Their current medications and supplements 4. The patient's functional ability including ADL's, fall risks, home safety risks and hearing or visual             impairment. 5. Diet and physical activities 6. Evidence for depression or mood disorders  The patients weight, height, BMI and visual acuity have been recorded in the chart I have made referrals, counseling and provided education to the patient based review of the above and I have provided the pt with a written personalized care plan for preventive services.  I have provided you with a copy of your personalized plan for preventive services. Please take the time to review along with your updated medication list.  Will update pneumovax Colon due 2022 Reconsider PSA next year Discussed walking more

## 2017-03-06 NOTE — Addendum Note (Signed)
Addended by: Pilar Grammes on: 03/06/2017 01:04 PM   Modules accepted: Orders

## 2017-03-06 NOTE — Assessment & Plan Note (Signed)
Compensated No changes now
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
## 2017-03-06 NOTE — Assessment & Plan Note (Signed)
Peritoneal dialysis now--may need hemodialysis for a while soon

## 2017-03-06 NOTE — Assessment & Plan Note (Signed)
No longer MDD--more like chronic dysthymia Will continue the citalopram

## 2017-03-06 NOTE — Assessment & Plan Note (Signed)
Still seems to have good control 

## 2017-03-07 ENCOUNTER — Other Ambulatory Visit: Payer: Self-pay | Admitting: Internal Medicine

## 2017-03-07 DIAGNOSIS — N186 End stage renal disease: Secondary | ICD-10-CM | POA: Diagnosis not present

## 2017-03-07 DIAGNOSIS — Z992 Dependence on renal dialysis: Secondary | ICD-10-CM | POA: Diagnosis not present

## 2017-03-08 DIAGNOSIS — Z992 Dependence on renal dialysis: Secondary | ICD-10-CM | POA: Diagnosis not present

## 2017-03-08 DIAGNOSIS — N186 End stage renal disease: Secondary | ICD-10-CM | POA: Diagnosis not present

## 2017-03-09 DIAGNOSIS — N186 End stage renal disease: Secondary | ICD-10-CM | POA: Diagnosis not present

## 2017-03-09 DIAGNOSIS — Z992 Dependence on renal dialysis: Secondary | ICD-10-CM | POA: Diagnosis not present

## 2017-03-10 DIAGNOSIS — Z992 Dependence on renal dialysis: Secondary | ICD-10-CM | POA: Diagnosis not present

## 2017-03-10 DIAGNOSIS — N186 End stage renal disease: Secondary | ICD-10-CM | POA: Diagnosis not present

## 2017-03-11 DIAGNOSIS — N186 End stage renal disease: Secondary | ICD-10-CM | POA: Diagnosis not present

## 2017-03-11 DIAGNOSIS — Z992 Dependence on renal dialysis: Secondary | ICD-10-CM | POA: Diagnosis not present

## 2017-03-12 ENCOUNTER — Encounter: Payer: Self-pay | Admitting: Vascular Surgery

## 2017-03-12 DIAGNOSIS — Z992 Dependence on renal dialysis: Secondary | ICD-10-CM | POA: Diagnosis not present

## 2017-03-12 DIAGNOSIS — N186 End stage renal disease: Secondary | ICD-10-CM | POA: Diagnosis not present

## 2017-03-13 DIAGNOSIS — N186 End stage renal disease: Secondary | ICD-10-CM | POA: Diagnosis not present

## 2017-03-13 DIAGNOSIS — Z992 Dependence on renal dialysis: Secondary | ICD-10-CM | POA: Diagnosis not present

## 2017-03-14 ENCOUNTER — Encounter (INDEPENDENT_AMBULATORY_CARE_PROVIDER_SITE_OTHER): Payer: Self-pay | Admitting: Vascular Surgery

## 2017-03-14 ENCOUNTER — Ambulatory Visit (INDEPENDENT_AMBULATORY_CARE_PROVIDER_SITE_OTHER): Payer: Medicare HMO | Admitting: Vascular Surgery

## 2017-03-14 VITALS — BP 140/67 | HR 55 | Resp 16 | Wt 247.0 lb

## 2017-03-14 DIAGNOSIS — N186 End stage renal disease: Secondary | ICD-10-CM | POA: Diagnosis not present

## 2017-03-14 DIAGNOSIS — Z09 Encounter for follow-up examination after completed treatment for conditions other than malignant neoplasm: Secondary | ICD-10-CM

## 2017-03-14 DIAGNOSIS — Z992 Dependence on renal dialysis: Secondary | ICD-10-CM | POA: Diagnosis not present
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
## 2017-03-15 DIAGNOSIS — Z992 Dependence on renal dialysis: Secondary | ICD-10-CM | POA: Diagnosis not present

## 2017-03-15 DIAGNOSIS — N186 End stage renal disease: Secondary | ICD-10-CM | POA: Diagnosis not present

## 2017-03-16 DIAGNOSIS — N186 End stage renal disease: Secondary | ICD-10-CM | POA: Diagnosis not present

## 2017-03-16 DIAGNOSIS — Z992 Dependence on renal dialysis: Secondary | ICD-10-CM | POA: Diagnosis not present

## 2017-03-17 DIAGNOSIS — Z992 Dependence on renal dialysis: Secondary | ICD-10-CM | POA: Diagnosis not present

## 2017-03-17 DIAGNOSIS — N186 End stage renal disease: Secondary | ICD-10-CM | POA: Diagnosis not present

## 2017-03-18 DIAGNOSIS — N186 End stage renal disease: Secondary | ICD-10-CM | POA: Diagnosis not present

## 2017-03-18 DIAGNOSIS — Z992 Dependence on renal dialysis: Secondary | ICD-10-CM | POA: Diagnosis not present

## 2017-03-19 DIAGNOSIS — N186 End stage renal disease: Secondary | ICD-10-CM | POA: Diagnosis not present

## 2017-03-19 DIAGNOSIS — Z992 Dependence on renal dialysis: Secondary | ICD-10-CM | POA: Diagnosis not present

## 2017-03-20 DIAGNOSIS — N186 End stage renal disease: Secondary | ICD-10-CM | POA: Diagnosis not present

## 2017-03-20 DIAGNOSIS — Z992 Dependence on renal dialysis: Secondary | ICD-10-CM | POA: Diagnosis not present

## 2017-03-21 ENCOUNTER — Other Ambulatory Visit: Payer: Self-pay | Admitting: Internal Medicine

## 2017-03-21 DIAGNOSIS — N186 End stage renal disease: Secondary | ICD-10-CM | POA: Diagnosis not present

## 2017-03-21 DIAGNOSIS — Z992 Dependence on renal dialysis: Secondary | ICD-10-CM | POA: Diagnosis not present

## 2017-03-22 DIAGNOSIS — N186 End stage renal disease: Secondary | ICD-10-CM | POA: Diagnosis not present

## 2017-03-22 DIAGNOSIS — Z992 Dependence on renal dialysis: Secondary | ICD-10-CM | POA: Diagnosis not present

## 2017-03-22 DIAGNOSIS — Z23 Encounter for immunization: Secondary | ICD-10-CM | POA: Diagnosis not present

## 2017-03-22 DIAGNOSIS — Z7682 Awaiting organ transplant status: Secondary | ICD-10-CM | POA: Diagnosis not present

## 2017-03-23 DIAGNOSIS — Z992 Dependence on renal dialysis: Secondary | ICD-10-CM | POA: Diagnosis not present

## 2017-03-23 DIAGNOSIS — N186 End stage renal disease: Secondary | ICD-10-CM | POA: Diagnosis not present

## 2017-03-23 DIAGNOSIS — Z23 Encounter for immunization: Secondary | ICD-10-CM | POA: Diagnosis not present

## 2017-03-24 DIAGNOSIS — Z23 Encounter for immunization: Secondary | ICD-10-CM | POA: Diagnosis not present

## 2017-03-24 DIAGNOSIS — N186 End stage renal disease: Secondary | ICD-10-CM | POA: Diagnosis not present

## 2017-03-24 DIAGNOSIS — Z992 Dependence on renal dialysis: Secondary | ICD-10-CM | POA: Diagnosis not present

## 2017-03-25 DIAGNOSIS — Z992 Dependence on renal dialysis: Secondary | ICD-10-CM | POA: Diagnosis not present

## 2017-03-25 DIAGNOSIS — N186 End stage renal disease: Secondary | ICD-10-CM | POA: Diagnosis not present

## 2017-03-25 DIAGNOSIS — E113293 Type 2 diabetes mellitus with mild nonproliferative diabetic retinopathy without macular edema, bilateral: Secondary | ICD-10-CM | POA: Diagnosis not present

## 2017-03-25 DIAGNOSIS — H524 Presbyopia: Secondary | ICD-10-CM | POA: Diagnosis not present

## 2017-03-25 DIAGNOSIS — Z23 Encounter for immunization: Secondary | ICD-10-CM | POA: Diagnosis not present

## 2017-03-25 LAB — HM DIABETES EYE EXAM

## 2017-03-26 DIAGNOSIS — N186 End stage renal disease: Secondary | ICD-10-CM | POA: Diagnosis not present

## 2017-03-26 DIAGNOSIS — Z23 Encounter for immunization: Secondary | ICD-10-CM | POA: Diagnosis not present

## 2017-03-26 DIAGNOSIS — Z992 Dependence on renal dialysis: Secondary | ICD-10-CM | POA: Diagnosis not present

## 2017-03-27 DIAGNOSIS — N186 End stage renal disease: Secondary | ICD-10-CM | POA: Diagnosis not present

## 2017-03-27 DIAGNOSIS — Z992 Dependence on renal dialysis: Secondary | ICD-10-CM | POA: Diagnosis not present

## 2017-03-27 DIAGNOSIS — Z23 Encounter for immunization: Secondary | ICD-10-CM | POA: Diagnosis not present

## 2017-03-28 DIAGNOSIS — N186 End stage renal disease: Secondary | ICD-10-CM | POA: Diagnosis not present

## 2017-03-28 DIAGNOSIS — Z992 Dependence on renal dialysis: Secondary | ICD-10-CM | POA: Diagnosis not present

## 2017-03-28 DIAGNOSIS — Z23 Encounter for immunization: Secondary | ICD-10-CM | POA: Diagnosis not present

## 2017-03-29 DIAGNOSIS — Z23 Encounter for immunization: Secondary | ICD-10-CM | POA: Diagnosis not present

## 2017-03-29 DIAGNOSIS — Z992 Dependence on renal dialysis: Secondary | ICD-10-CM | POA: Diagnosis not present

## 2017-03-29 DIAGNOSIS — N186 End stage renal disease: Secondary | ICD-10-CM | POA: Diagnosis not present

## 2017-03-30 DIAGNOSIS — Z23 Encounter for immunization: Secondary | ICD-10-CM | POA: Diagnosis not present

## 2017-03-30 DIAGNOSIS — N186 End stage renal disease: Secondary | ICD-10-CM | POA: Diagnosis not present

## 2017-03-30 DIAGNOSIS — Z992 Dependence on renal dialysis: Secondary | ICD-10-CM | POA: Diagnosis not present

## 2017-03-31 DIAGNOSIS — Z992 Dependence on renal dialysis: Secondary | ICD-10-CM | POA: Diagnosis not present

## 2017-03-31 DIAGNOSIS — Z23 Encounter for immunization: Secondary | ICD-10-CM | POA: Diagnosis not present

## 2017-03-31 DIAGNOSIS — N186 End stage renal disease: Secondary | ICD-10-CM | POA: Diagnosis not present

## 2017-04-01 DIAGNOSIS — Z23 Encounter for immunization: Secondary | ICD-10-CM | POA: Diagnosis not present

## 2017-04-01 DIAGNOSIS — N186 End stage renal disease: Secondary | ICD-10-CM | POA: Diagnosis not present

## 2017-04-01 DIAGNOSIS — Z992 Dependence on renal dialysis: Secondary | ICD-10-CM | POA: Diagnosis not present

## 2017-04-02 DIAGNOSIS — N186 End stage renal disease: Secondary | ICD-10-CM | POA: Diagnosis not present

## 2017-04-02 DIAGNOSIS — Z992 Dependence on renal dialysis: Secondary | ICD-10-CM | POA: Diagnosis not present

## 2017-04-02 DIAGNOSIS — Z23 Encounter for immunization: Secondary | ICD-10-CM | POA: Diagnosis not present

## 2017-04-03 DIAGNOSIS — Z23 Encounter for immunization: Secondary | ICD-10-CM | POA: Diagnosis not present

## 2017-04-03 DIAGNOSIS — Z992 Dependence on renal dialysis: Secondary | ICD-10-CM | POA: Diagnosis not present

## 2017-04-03 DIAGNOSIS — N186 End stage renal disease: Secondary | ICD-10-CM | POA: Diagnosis not present

## 2017-04-04 DIAGNOSIS — Z23 Encounter for immunization: Secondary | ICD-10-CM | POA: Diagnosis not present

## 2017-04-04 DIAGNOSIS — N186 End stage renal disease: Secondary | ICD-10-CM | POA: Diagnosis not present

## 2017-04-04 DIAGNOSIS — Z992 Dependence on renal dialysis: Secondary | ICD-10-CM | POA: Diagnosis not present

## 2017-04-05 DIAGNOSIS — Z23 Encounter for immunization: Secondary | ICD-10-CM | POA: Diagnosis not present

## 2017-04-05 DIAGNOSIS — Z992 Dependence on renal dialysis: Secondary | ICD-10-CM | POA: Diagnosis not present

## 2017-04-05 DIAGNOSIS — N186 End stage renal disease: Secondary | ICD-10-CM | POA: Diagnosis not present

## 2017-04-06 DIAGNOSIS — N186 End stage renal disease: Secondary | ICD-10-CM | POA: Diagnosis not present

## 2017-04-06 DIAGNOSIS — Z23 Encounter for immunization: Secondary | ICD-10-CM | POA: Diagnosis not present

## 2017-04-06 DIAGNOSIS — Z992 Dependence on renal dialysis: Secondary | ICD-10-CM | POA: Diagnosis not present

## 2017-04-07 DIAGNOSIS — Z992 Dependence on renal dialysis: Secondary | ICD-10-CM | POA: Diagnosis not present

## 2017-04-07 DIAGNOSIS — Z23 Encounter for immunization: Secondary | ICD-10-CM | POA: Diagnosis not present

## 2017-04-07 DIAGNOSIS — N186 End stage renal disease: Secondary | ICD-10-CM | POA: Diagnosis not present

## 2017-04-08 DIAGNOSIS — N186 End stage renal disease: Secondary | ICD-10-CM | POA: Diagnosis not present

## 2017-04-08 DIAGNOSIS — Z23 Encounter for immunization: Secondary | ICD-10-CM | POA: Diagnosis not present

## 2017-04-08 DIAGNOSIS — Z992 Dependence on renal dialysis: Secondary | ICD-10-CM | POA: Diagnosis not present

## 2017-04-09 DIAGNOSIS — N186 End stage renal disease: Secondary | ICD-10-CM | POA: Diagnosis not present

## 2017-04-09 DIAGNOSIS — Z992 Dependence on renal dialysis: Secondary | ICD-10-CM | POA: Diagnosis not present

## 2017-04-09 DIAGNOSIS — Z23 Encounter for immunization: Secondary | ICD-10-CM | POA: Diagnosis not present

## 2017-04-10 DIAGNOSIS — Z992 Dependence on renal dialysis: Secondary | ICD-10-CM | POA: Diagnosis not present

## 2017-04-10 DIAGNOSIS — N186 End stage renal disease: Secondary | ICD-10-CM | POA: Diagnosis not present

## 2017-04-10 DIAGNOSIS — Z23 Encounter for immunization: Secondary | ICD-10-CM | POA: Diagnosis not present

## 2017-04-11 DIAGNOSIS — Z23 Encounter for immunization: Secondary | ICD-10-CM | POA: Diagnosis not present

## 2017-04-11 DIAGNOSIS — N186 End stage renal disease: Secondary | ICD-10-CM | POA: Diagnosis not present

## 2017-04-11 DIAGNOSIS — Z992 Dependence on renal dialysis: Secondary | ICD-10-CM | POA: Diagnosis not present

## 2017-04-12 DIAGNOSIS — N186 End stage renal disease: Secondary | ICD-10-CM | POA: Diagnosis not present

## 2017-04-12 DIAGNOSIS — Z23 Encounter for immunization: Secondary | ICD-10-CM | POA: Diagnosis not present

## 2017-04-12 DIAGNOSIS — Z992 Dependence on renal dialysis: Secondary | ICD-10-CM | POA: Diagnosis not present

## 2017-04-13 DIAGNOSIS — N186 End stage renal disease: Secondary | ICD-10-CM | POA: Diagnosis not present

## 2017-04-13 DIAGNOSIS — Z23 Encounter for immunization: Secondary | ICD-10-CM | POA: Diagnosis not present

## 2017-04-13 DIAGNOSIS — Z992 Dependence on renal dialysis: Secondary | ICD-10-CM | POA: Diagnosis not present

## 2017-04-14 DIAGNOSIS — Z23 Encounter for immunization: Secondary | ICD-10-CM | POA: Diagnosis not present

## 2017-04-14 DIAGNOSIS — N186 End stage renal disease: Secondary | ICD-10-CM | POA: Diagnosis not present

## 2017-04-14 DIAGNOSIS — Z992 Dependence on renal dialysis: Secondary | ICD-10-CM | POA: Diagnosis not present

## 2017-04-15 DIAGNOSIS — Z23 Encounter for immunization: Secondary | ICD-10-CM | POA: Diagnosis not present

## 2017-04-15 DIAGNOSIS — Z992 Dependence on renal dialysis: Secondary | ICD-10-CM | POA: Diagnosis not present

## 2017-04-15 DIAGNOSIS — N186 End stage renal disease: Secondary | ICD-10-CM | POA: Diagnosis not present

## 2017-04-16 DIAGNOSIS — Z992 Dependence on renal dialysis: Secondary | ICD-10-CM | POA: Diagnosis not present

## 2017-04-16 DIAGNOSIS — N186 End stage renal disease: Secondary | ICD-10-CM | POA: Diagnosis not present

## 2017-04-16 DIAGNOSIS — Z23 Encounter for immunization: Secondary | ICD-10-CM | POA: Diagnosis not present

## 2017-04-17 DIAGNOSIS — N186 End stage renal disease: Secondary | ICD-10-CM | POA: Diagnosis not present

## 2017-04-17 DIAGNOSIS — Z23 Encounter for immunization: Secondary | ICD-10-CM | POA: Diagnosis not present

## 2017-04-17 DIAGNOSIS — Z992 Dependence on renal dialysis: Secondary | ICD-10-CM | POA: Diagnosis not present

## 2017-04-18 DIAGNOSIS — Z23 Encounter for immunization: Secondary | ICD-10-CM | POA: Diagnosis not present

## 2017-04-18 DIAGNOSIS — Z992 Dependence on renal dialysis: Secondary | ICD-10-CM | POA: Diagnosis not present

## 2017-04-18 DIAGNOSIS — N186 End stage renal disease: Secondary | ICD-10-CM | POA: Diagnosis not present

## 2017-04-19 DIAGNOSIS — Z23 Encounter for immunization: Secondary | ICD-10-CM | POA: Diagnosis not present

## 2017-04-19 DIAGNOSIS — Z992 Dependence on renal dialysis: Secondary | ICD-10-CM | POA: Diagnosis not present

## 2017-04-19 DIAGNOSIS — N186 End stage renal disease: Secondary | ICD-10-CM | POA: Diagnosis not present

## 2017-04-20 DIAGNOSIS — N186 End stage renal disease: Secondary | ICD-10-CM | POA: Diagnosis not present

## 2017-04-20 DIAGNOSIS — Z992 Dependence on renal dialysis: Secondary | ICD-10-CM | POA: Diagnosis not present

## 2017-04-20 DIAGNOSIS — Z23 Encounter for immunization: Secondary | ICD-10-CM | POA: Diagnosis not present

## 2017-04-21 DIAGNOSIS — N186 End stage renal disease: Secondary | ICD-10-CM | POA: Diagnosis not present

## 2017-04-21 DIAGNOSIS — Z992 Dependence on renal dialysis: Secondary | ICD-10-CM | POA: Diagnosis not present

## 2017-04-22 DIAGNOSIS — Z992 Dependence on renal dialysis: Secondary | ICD-10-CM | POA: Diagnosis not present

## 2017-04-22 DIAGNOSIS — N186 End stage renal disease: Secondary | ICD-10-CM | POA: Diagnosis not present

## 2017-04-23 DIAGNOSIS — Z992 Dependence on renal dialysis: Secondary | ICD-10-CM | POA: Diagnosis not present

## 2017-04-23 DIAGNOSIS — N186 End stage renal disease: Secondary | ICD-10-CM | POA: Diagnosis not present

## 2017-04-24 DIAGNOSIS — N186 End stage renal disease: Secondary | ICD-10-CM | POA: Diagnosis not present

## 2017-04-24 DIAGNOSIS — Z992 Dependence on renal dialysis: Secondary | ICD-10-CM | POA: Diagnosis not present

## 2017-04-25 DIAGNOSIS — Z992 Dependence on renal dialysis: Secondary | ICD-10-CM | POA: Diagnosis not present

## 2017-04-25 DIAGNOSIS — N186 End stage renal disease: Secondary | ICD-10-CM | POA: Diagnosis not present

## 2017-04-26 DIAGNOSIS — Z992 Dependence on renal dialysis: Secondary | ICD-10-CM | POA: Diagnosis not present

## 2017-04-26 DIAGNOSIS — N186 End stage renal disease: Secondary | ICD-10-CM | POA: Diagnosis not present

## 2017-04-27 DIAGNOSIS — Z992 Dependence on renal dialysis: Secondary | ICD-10-CM | POA: Diagnosis not present

## 2017-04-27 DIAGNOSIS — N186 End stage renal disease: Secondary | ICD-10-CM | POA: Diagnosis not present

## 2017-04-28 DIAGNOSIS — Z992 Dependence on renal dialysis: Secondary | ICD-10-CM | POA: Diagnosis not present

## 2017-04-28 DIAGNOSIS — N186 End stage renal disease: Secondary | ICD-10-CM | POA: Diagnosis not present

## 2017-04-29 DIAGNOSIS — Z992 Dependence on renal dialysis: Secondary | ICD-10-CM | POA: Diagnosis not present

## 2017-04-29 DIAGNOSIS — N186 End stage renal disease: Secondary | ICD-10-CM | POA: Diagnosis not present

## 2017-04-30 DIAGNOSIS — N186 End stage renal disease: Secondary | ICD-10-CM | POA: Diagnosis not present

## 2017-04-30 DIAGNOSIS — Z992 Dependence on renal dialysis: Secondary | ICD-10-CM | POA: Diagnosis not present

## 2017-05-01 DIAGNOSIS — N186 End stage renal disease: Secondary | ICD-10-CM | POA: Diagnosis not present

## 2017-05-01 DIAGNOSIS — Z992 Dependence on renal dialysis: Secondary | ICD-10-CM | POA: Diagnosis not present

## 2017-05-02 DIAGNOSIS — N186 End stage renal disease: Secondary | ICD-10-CM | POA: Diagnosis not present

## 2017-05-02 DIAGNOSIS — Z992 Dependence on renal dialysis: Secondary | ICD-10-CM | POA: Diagnosis not present

## 2017-05-03 DIAGNOSIS — Z992 Dependence on renal dialysis: Secondary | ICD-10-CM | POA: Diagnosis not present

## 2017-05-03 DIAGNOSIS — N186 End stage renal disease: Secondary | ICD-10-CM | POA: Diagnosis not present

## 2017-05-04 DIAGNOSIS — N186 End stage renal disease: Secondary | ICD-10-CM | POA: Diagnosis not present

## 2017-05-04 DIAGNOSIS — Z992 Dependence on renal dialysis: Secondary | ICD-10-CM | POA: Diagnosis not present

## 2017-05-05 DIAGNOSIS — Z992 Dependence on renal dialysis: Secondary | ICD-10-CM | POA: Diagnosis not present

## 2017-05-05 DIAGNOSIS — N186 End stage renal disease: Secondary | ICD-10-CM | POA: Diagnosis not present

## 2017-05-06 DIAGNOSIS — Z992 Dependence on renal dialysis: Secondary | ICD-10-CM | POA: Diagnosis not present

## 2017-05-06 DIAGNOSIS — N186 End stage renal disease: Secondary | ICD-10-CM | POA: Diagnosis not present

## 2017-05-07 DIAGNOSIS — N186 End stage renal disease: Secondary | ICD-10-CM | POA: Diagnosis not present

## 2017-05-07 DIAGNOSIS — Z992 Dependence on renal dialysis: Secondary | ICD-10-CM | POA: Diagnosis not present

## 2017-05-08 DIAGNOSIS — N186 End stage renal disease: Secondary | ICD-10-CM | POA: Diagnosis not present

## 2017-05-08 DIAGNOSIS — Z992 Dependence on renal dialysis: Secondary | ICD-10-CM | POA: Diagnosis not present

## 2017-05-09 DIAGNOSIS — N186 End stage renal disease: Secondary | ICD-10-CM | POA: Diagnosis not present

## 2017-05-09 DIAGNOSIS — Z992 Dependence on renal dialysis: Secondary | ICD-10-CM | POA: Diagnosis not present

## 2017-05-10 ENCOUNTER — Encounter: Payer: Self-pay | Admitting: Emergency Medicine

## 2017-05-10 ENCOUNTER — Emergency Department
Admission: EM | Admit: 2017-05-10 | Discharge: 2017-05-11 | Disposition: A | Discharge status: Home / Self Care | Payer: Medicare HMO | Attending: Emergency Medicine | Admitting: Emergency Medicine

## 2017-05-10 DIAGNOSIS — J45909 Unspecified asthma, uncomplicated: Secondary | ICD-10-CM | POA: Insufficient documentation

## 2017-05-10 DIAGNOSIS — Z79899 Other long term (current) drug therapy: Secondary | ICD-10-CM | POA: Diagnosis not present

## 2017-05-10 DIAGNOSIS — N186 End stage renal disease: Secondary | ICD-10-CM | POA: Diagnosis not present

## 2017-05-10 DIAGNOSIS — J449 Chronic obstructive pulmonary disease, unspecified: Secondary | ICD-10-CM | POA: Diagnosis not present

## 2017-05-10 DIAGNOSIS — R531 Weakness: Secondary | ICD-10-CM | POA: Diagnosis present

## 2017-05-10 DIAGNOSIS — N189 Chronic kidney disease, unspecified: Secondary | ICD-10-CM | POA: Diagnosis not present

## 2017-05-10 DIAGNOSIS — Z992 Dependence on renal dialysis: Secondary | ICD-10-CM | POA: Diagnosis not present

## 2017-05-10 DIAGNOSIS — I129 Hypertensive chronic kidney disease with stage 1 through stage 4 chronic kidney disease, or unspecified chronic kidney disease: Secondary | ICD-10-CM | POA: Insufficient documentation

## 2017-05-10 DIAGNOSIS — I5032 Chronic diastolic (congestive) heart failure: Secondary | ICD-10-CM | POA: Insufficient documentation

## 2017-05-10 DIAGNOSIS — E1122 Type 2 diabetes mellitus with diabetic chronic kidney disease: Secondary | ICD-10-CM | POA: Diagnosis not present

## 2017-05-10 LAB — CBC
HCT: 31.9 % — ABNORMAL LOW (ref 40.0–52.0)
Hemoglobin: 10.3 g/dL — ABNORMAL LOW (ref 13.0–18.0)
MCH: 29.4 pg (ref 26.0–34.0)
MCHC: 32.3 g/dL (ref 32.0–36.0)
MCV: 91 fL (ref 80.0–100.0)
PLATELETS: 114 10*3/uL — AB (ref 150–440)
RBC: 3.5 MIL/uL — AB (ref 4.40–5.90)
RDW: 20.4 % — AB (ref 11.5–14.5)
WBC: 4.1 10*3/uL (ref 3.8–10.6)

## 2017-05-10 LAB — BASIC METABOLIC PANEL
Anion gap: 5 (ref 5–15)
BUN: 88 mg/dL — AB (ref 6–20)
CALCIUM: 7.6 mg/dL — AB (ref 8.9–10.3)
CO2: 21 mmol/L — ABNORMAL LOW (ref 22–32)
CREATININE: 6.3 mg/dL — AB (ref 0.61–1.24)
Chloride: 108 mmol/L (ref 101–111)
GFR calc non Af Amer: 8 mL/min — ABNORMAL LOW (ref >60)
GFR, EST AFRICAN AMERICAN: 10 mL/min — AB (ref >60)
Glucose, Bld: 163 mg/dL — ABNORMAL HIGH (ref 65–99)
Potassium: 4.5 mmol/L (ref 3.5–5.1)
SODIUM: 134 mmol/L — AB (ref 135–145)

## 2017-05-10 MED ORDER — SODIUM CHLORIDE 0.9 % IV SOLN
1.0000 g | Freq: Once | INTRAVENOUS | Status: AC
  Administered 2017-05-11: 01:00:00 via INTRAVENOUS
  Filled 2017-05-10: qty 10
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
## 2017-05-10 NOTE — ED Triage Notes (Signed)
Pt reports receives PD nightly, last treatment last night. Pt reports today feels weak and has all over body cramps. Pt ambulatory to triage. No apparent distress noted.

## 2017-05-11 DIAGNOSIS — N186 End stage renal disease: Secondary | ICD-10-CM | POA: Diagnosis not present

## 2017-05-11 DIAGNOSIS — Z992 Dependence on renal dialysis: Secondary | ICD-10-CM | POA: Diagnosis not present

## 2017-05-11 LAB — URINALYSIS, COMPLETE (UACMP) WITH MICROSCOPIC
BILIRUBIN URINE: NEGATIVE
GLUCOSE, UA: NEGATIVE mg/dL
Ketones, ur: NEGATIVE mg/dL
Nitrite: NEGATIVE
PH: 5 (ref 5.0–8.0)
Protein, ur: 30 mg/dL — AB
SPECIFIC GRAVITY, URINE: 1.009 (ref 1.005–1.030)
SQUAMOUS EPITHELIAL / LPF: NONE SEEN

## 2017-05-11 MED ORDER — CALCIUM GLUCONATE 500 MG PO TABS: 1.0000 | ORAL_TABLET | Freq: Three times a day (TID) | ORAL | 0 refills | Status: DC

## 2017-05-12 DIAGNOSIS — Z992 Dependence on renal dialysis: Secondary | ICD-10-CM | POA: Diagnosis not present

## 2017-05-12 DIAGNOSIS — N186 End stage renal disease: Secondary | ICD-10-CM | POA: Diagnosis not present

## 2017-05-13 ENCOUNTER — Telehealth: Payer: Self-pay

## 2017-05-13 DIAGNOSIS — Z992 Dependence on renal dialysis: Secondary | ICD-10-CM | POA: Diagnosis not present

## 2017-05-13 DIAGNOSIS — N186 End stage renal disease: Secondary | ICD-10-CM | POA: Diagnosis not present

## 2017-05-13 NOTE — Telephone Encounter (Signed)
Spoke to pt's wife per DPR. She said he is doing much better. Taking the medication.

## 2017-05-14 ENCOUNTER — Other Ambulatory Visit: Payer: Self-pay

## 2017-05-14 DIAGNOSIS — N186 End stage renal disease: Secondary | ICD-10-CM | POA: Diagnosis not present

## 2017-05-14 DIAGNOSIS — Z992 Dependence on renal dialysis: Secondary | ICD-10-CM | POA: Diagnosis not present

## 2017-05-14 MED ORDER — ALLOPURINOL 300 MG PO TABS: ORAL_TABLET | ORAL | 3 refills | Status: DC

## 2017-05-14 NOTE — Telephone Encounter (Signed)
Rx sent electronically.  

## 2017-05-15 ENCOUNTER — Other Ambulatory Visit: Payer: Self-pay | Admitting: Urology

## 2017-05-15 DIAGNOSIS — Z992 Dependence on renal dialysis: Secondary | ICD-10-CM | POA: Diagnosis not present

## 2017-05-15 DIAGNOSIS — N186 End stage renal disease: Secondary | ICD-10-CM | POA: Diagnosis not present

## 2017-05-16 DIAGNOSIS — Z992 Dependence on renal dialysis: Secondary | ICD-10-CM | POA: Diagnosis not present

## 2017-05-16 DIAGNOSIS — N186 End stage renal disease: Secondary | ICD-10-CM | POA: Diagnosis not present

## 2017-05-17 DIAGNOSIS — Z992 Dependence on renal dialysis: Secondary | ICD-10-CM | POA: Diagnosis not present

## 2017-05-17 DIAGNOSIS — N186 End stage renal disease: Secondary | ICD-10-CM | POA: Diagnosis not present

## 2017-05-18 DIAGNOSIS — Z992 Dependence on renal dialysis: Secondary | ICD-10-CM | POA: Diagnosis not present

## 2017-05-18 DIAGNOSIS — N186 End stage renal disease: Secondary | ICD-10-CM | POA: Diagnosis not present

## 2017-05-19 DIAGNOSIS — N186 End stage renal disease: Secondary | ICD-10-CM | POA: Diagnosis not present

## 2017-05-19 DIAGNOSIS — Z992 Dependence on renal dialysis: Secondary | ICD-10-CM | POA: Diagnosis not present

## 2017-05-20 DIAGNOSIS — Z992 Dependence on renal dialysis: Secondary | ICD-10-CM | POA: Diagnosis not present

## 2017-05-20 DIAGNOSIS — N186 End stage renal disease: Secondary | ICD-10-CM | POA: Diagnosis not present

## 2017-05-21 ENCOUNTER — Telehealth: Payer: Self-pay | Admitting: Urology

## 2017-05-21 ENCOUNTER — Other Ambulatory Visit: Payer: Self-pay | Admitting: Urology

## 2017-05-21 DIAGNOSIS — N186 End stage renal disease: Secondary | ICD-10-CM | POA: Diagnosis not present

## 2017-05-21 DIAGNOSIS — N401 Enlarged prostate with lower urinary tract symptoms: Secondary | ICD-10-CM

## 2017-05-21 DIAGNOSIS — Z992 Dependence on renal dialysis: Secondary | ICD-10-CM | POA: Diagnosis not present

## 2017-05-21 MED ORDER — TAMSULOSIN HCL 0.4 MG PO CAPS: 0.4000 mg | ORAL_CAPSULE | Freq: Every day | ORAL | 3 refills | Status: DC

## 2017-05-21 NOTE — Telephone Encounter (Signed)
Pt called office asking for a refill on his tamsulosin (FLOMAX) 0.4 MG CAPS capsule. Pt states he is completely out of medication. [facility] on [address] Please advise.

## 2017-05-21 NOTE — Addendum Note (Signed)
Addended by: Lestine Box on: 05/21/2017 04:54 PM   Modules accepted: Orders

## 2017-05-21 NOTE — Telephone Encounter (Signed)
Spoke with pt in reference to medications. Made aware refills sent. Pt voiced understanding.

## 2017-05-21 NOTE — Telephone Encounter (Signed)
Pt called again and is completely out of medication.  He would like for someone to call him back before we close today.  He's been waiting since this morning.

## 2017-05-22 DIAGNOSIS — N186 End stage renal disease: Secondary | ICD-10-CM | POA: Diagnosis not present

## 2017-05-22 DIAGNOSIS — Z23 Encounter for immunization: Secondary | ICD-10-CM | POA: Diagnosis not present

## 2017-05-22 DIAGNOSIS — Z992 Dependence on renal dialysis: Secondary | ICD-10-CM | POA: Diagnosis not present

## 2017-05-23 DIAGNOSIS — Z23 Encounter for immunization: Secondary | ICD-10-CM | POA: Diagnosis not present

## 2017-05-23 DIAGNOSIS — N186 End stage renal disease: Secondary | ICD-10-CM | POA: Diagnosis not present

## 2017-05-23 DIAGNOSIS — Z992 Dependence on renal dialysis: Secondary | ICD-10-CM | POA: Diagnosis not present

## 2017-05-24 DIAGNOSIS — N186 End stage renal disease: Secondary | ICD-10-CM | POA: Diagnosis not present

## 2017-05-24 DIAGNOSIS — Z23 Encounter for immunization: Secondary | ICD-10-CM | POA: Diagnosis not present

## 2017-05-24 DIAGNOSIS — Z992 Dependence on renal dialysis: Secondary | ICD-10-CM | POA: Diagnosis not present

## 2017-05-25 DIAGNOSIS — N186 End stage renal disease: Secondary | ICD-10-CM | POA: Diagnosis not present

## 2017-05-25 DIAGNOSIS — Z992 Dependence on renal dialysis: Secondary | ICD-10-CM | POA: Diagnosis not present

## 2017-05-25 DIAGNOSIS — Z23 Encounter for immunization: Secondary | ICD-10-CM | POA: Diagnosis not present

## 2017-05-26 DIAGNOSIS — Z23 Encounter for immunization: Secondary | ICD-10-CM | POA: Diagnosis not present

## 2017-05-26 DIAGNOSIS — N186 End stage renal disease: Secondary | ICD-10-CM | POA: Diagnosis not present

## 2017-05-26 DIAGNOSIS — Z992 Dependence on renal dialysis: Secondary | ICD-10-CM | POA: Diagnosis not present

## 2017-05-27 DIAGNOSIS — Z992 Dependence on renal dialysis: Secondary | ICD-10-CM | POA: Diagnosis not present

## 2017-05-27 DIAGNOSIS — N186 End stage renal disease: Secondary | ICD-10-CM | POA: Diagnosis not present

## 2017-05-27 DIAGNOSIS — Z23 Encounter for immunization: Secondary | ICD-10-CM | POA: Diagnosis not present

## 2017-05-28 DIAGNOSIS — N186 End stage renal disease: Secondary | ICD-10-CM | POA: Diagnosis not present

## 2017-05-28 DIAGNOSIS — Z992 Dependence on renal dialysis: Secondary | ICD-10-CM | POA: Diagnosis not present

## 2017-05-28 DIAGNOSIS — Z23 Encounter for immunization: Secondary | ICD-10-CM | POA: Diagnosis not present

## 2017-05-29 DIAGNOSIS — Z992 Dependence on renal dialysis: Secondary | ICD-10-CM | POA: Diagnosis not present

## 2017-05-29 DIAGNOSIS — N186 End stage renal disease: Secondary | ICD-10-CM | POA: Diagnosis not present

## 2017-05-29 DIAGNOSIS — Z23 Encounter for immunization: Secondary | ICD-10-CM | POA: Diagnosis not present

## 2017-05-30 DIAGNOSIS — Z992 Dependence on renal dialysis: Secondary | ICD-10-CM | POA: Diagnosis not present

## 2017-05-30 DIAGNOSIS — Z23 Encounter for immunization: Secondary | ICD-10-CM | POA: Diagnosis not present

## 2017-05-30 DIAGNOSIS — N186 End stage renal disease: Secondary | ICD-10-CM | POA: Diagnosis not present

## 2017-05-31 DIAGNOSIS — Z992 Dependence on renal dialysis: Secondary | ICD-10-CM | POA: Diagnosis not present

## 2017-05-31 DIAGNOSIS — N186 End stage renal disease: Secondary | ICD-10-CM | POA: Diagnosis not present

## 2017-05-31 DIAGNOSIS — Z23 Encounter for immunization: Secondary | ICD-10-CM | POA: Diagnosis not present

## 2017-06-01 DIAGNOSIS — Z23 Encounter for immunization: Secondary | ICD-10-CM | POA: Diagnosis not present

## 2017-06-01 DIAGNOSIS — Z992 Dependence on renal dialysis: Secondary | ICD-10-CM | POA: Diagnosis not present

## 2017-06-01 DIAGNOSIS — N186 End stage renal disease: Secondary | ICD-10-CM | POA: Diagnosis not present

## 2017-06-02 DIAGNOSIS — Z992 Dependence on renal dialysis: Secondary | ICD-10-CM | POA: Diagnosis not present

## 2017-06-02 DIAGNOSIS — N186 End stage renal disease: Secondary | ICD-10-CM | POA: Diagnosis not present

## 2017-06-02 DIAGNOSIS — Z23 Encounter for immunization: Secondary | ICD-10-CM | POA: Diagnosis not present

## 2017-06-03 DIAGNOSIS — N186 End stage renal disease: Secondary | ICD-10-CM | POA: Diagnosis not present

## 2017-06-03 DIAGNOSIS — Z992 Dependence on renal dialysis: Secondary | ICD-10-CM | POA: Diagnosis not present

## 2017-06-03 DIAGNOSIS — Z23 Encounter for immunization: Secondary | ICD-10-CM | POA: Diagnosis not present

## 2017-06-04 DIAGNOSIS — E1122 Type 2 diabetes mellitus with diabetic chronic kidney disease: Secondary | ICD-10-CM | POA: Diagnosis not present

## 2017-06-04 DIAGNOSIS — I120 Hypertensive chronic kidney disease with stage 5 chronic kidney disease or end stage renal disease: Secondary | ICD-10-CM | POA: Diagnosis not present

## 2017-06-04 DIAGNOSIS — Z992 Dependence on renal dialysis: Secondary | ICD-10-CM | POA: Diagnosis not present

## 2017-06-04 DIAGNOSIS — N186 End stage renal disease: Secondary | ICD-10-CM | POA: Diagnosis not present

## 2017-06-04 DIAGNOSIS — N401 Enlarged prostate with lower urinary tract symptoms: Secondary | ICD-10-CM | POA: Diagnosis not present

## 2017-06-04 DIAGNOSIS — Z01810 Encounter for preprocedural cardiovascular examination: Secondary | ICD-10-CM | POA: Diagnosis not present

## 2017-06-04 DIAGNOSIS — Z01818 Encounter for other preprocedural examination: Secondary | ICD-10-CM | POA: Diagnosis not present

## 2017-06-04 DIAGNOSIS — Z23 Encounter for immunization: Secondary | ICD-10-CM | POA: Diagnosis not present

## 2017-06-04 DIAGNOSIS — Z87820 Personal history of traumatic brain injury: Secondary | ICD-10-CM | POA: Diagnosis not present

## 2017-06-04 DIAGNOSIS — Z87828 Personal history of other (healed) physical injury and trauma: Secondary | ICD-10-CM | POA: Diagnosis not present

## 2017-06-04 DIAGNOSIS — I151 Hypertension secondary to other renal disorders: Secondary | ICD-10-CM | POA: Diagnosis not present

## 2017-06-04 DIAGNOSIS — Z7682 Awaiting organ transplant status: Secondary | ICD-10-CM | POA: Diagnosis not present

## 2017-06-04 DIAGNOSIS — Z7984 Long term (current) use of oral hypoglycemic drugs: Secondary | ICD-10-CM | POA: Diagnosis not present

## 2017-06-04 DIAGNOSIS — E1121 Type 2 diabetes mellitus with diabetic nephropathy: Secondary | ICD-10-CM | POA: Diagnosis not present

## 2017-06-05 DIAGNOSIS — Z23 Encounter for immunization: Secondary | ICD-10-CM | POA: Diagnosis not present

## 2017-06-05 DIAGNOSIS — Z992 Dependence on renal dialysis: Secondary | ICD-10-CM | POA: Diagnosis not present

## 2017-06-05 DIAGNOSIS — N186 End stage renal disease: Secondary | ICD-10-CM | POA: Diagnosis not present

## 2017-06-06 DIAGNOSIS — Z23 Encounter for immunization: Secondary | ICD-10-CM | POA: Diagnosis not present

## 2017-06-06 DIAGNOSIS — N186 End stage renal disease: Secondary | ICD-10-CM | POA: Diagnosis not present

## 2017-06-06 DIAGNOSIS — Z992 Dependence on renal dialysis: Secondary | ICD-10-CM | POA: Diagnosis not present

## 2017-06-07 DIAGNOSIS — Z23 Encounter for immunization: Secondary | ICD-10-CM | POA: Diagnosis not present

## 2017-06-07 DIAGNOSIS — Z992 Dependence on renal dialysis: Secondary | ICD-10-CM | POA: Diagnosis not present

## 2017-06-07 DIAGNOSIS — N186 End stage renal disease: Secondary | ICD-10-CM | POA: Diagnosis not present

## 2017-06-08 DIAGNOSIS — Z23 Encounter for immunization: Secondary | ICD-10-CM | POA: Diagnosis not present

## 2017-06-08 DIAGNOSIS — Z992 Dependence on renal dialysis: Secondary | ICD-10-CM | POA: Diagnosis not present

## 2017-06-08 DIAGNOSIS — N186 End stage renal disease: Secondary | ICD-10-CM | POA: Diagnosis not present

## 2017-06-09 DIAGNOSIS — Z23 Encounter for immunization: Secondary | ICD-10-CM | POA: Diagnosis not present

## 2017-06-09 DIAGNOSIS — Z992 Dependence on renal dialysis: Secondary | ICD-10-CM | POA: Diagnosis not present

## 2017-06-09 DIAGNOSIS — N186 End stage renal disease: Secondary | ICD-10-CM | POA: Diagnosis not present

## 2017-06-10 DIAGNOSIS — Z23 Encounter for immunization: Secondary | ICD-10-CM | POA: Diagnosis not present

## 2017-06-10 DIAGNOSIS — Z992 Dependence on renal dialysis: Secondary | ICD-10-CM | POA: Diagnosis not present

## 2017-06-10 DIAGNOSIS — N186 End stage renal disease: Secondary | ICD-10-CM | POA: Diagnosis not present

## 2017-06-11 DIAGNOSIS — Z23 Encounter for immunization: Secondary | ICD-10-CM | POA: Diagnosis not present

## 2017-06-11 DIAGNOSIS — Z992 Dependence on renal dialysis: Secondary | ICD-10-CM | POA: Diagnosis not present

## 2017-06-11 DIAGNOSIS — N186 End stage renal disease: Secondary | ICD-10-CM | POA: Diagnosis not present

## 2017-06-12 DIAGNOSIS — Z992 Dependence on renal dialysis: Secondary | ICD-10-CM | POA: Diagnosis not present

## 2017-06-12 DIAGNOSIS — N186 End stage renal disease: Secondary | ICD-10-CM | POA: Diagnosis not present

## 2017-06-12 DIAGNOSIS — Z23 Encounter for immunization: Secondary | ICD-10-CM | POA: Diagnosis not present

## 2017-06-13 DIAGNOSIS — Z23 Encounter for immunization: Secondary | ICD-10-CM | POA: Diagnosis not present

## 2017-06-13 DIAGNOSIS — Z992 Dependence on renal dialysis: Secondary | ICD-10-CM | POA: Diagnosis not present

## 2017-06-13 DIAGNOSIS — N186 End stage renal disease: Secondary | ICD-10-CM | POA: Diagnosis not present

## 2017-06-14 DIAGNOSIS — N186 End stage renal disease: Secondary | ICD-10-CM | POA: Diagnosis not present

## 2017-06-14 DIAGNOSIS — Z992 Dependence on renal dialysis: Secondary | ICD-10-CM | POA: Diagnosis not present

## 2017-06-14 DIAGNOSIS — Z23 Encounter for immunization: Secondary | ICD-10-CM | POA: Diagnosis not present

## 2017-06-15 DIAGNOSIS — Z23 Encounter for immunization: Secondary | ICD-10-CM | POA: Diagnosis not present

## 2017-06-15 DIAGNOSIS — N186 End stage renal disease: Secondary | ICD-10-CM | POA: Diagnosis not present

## 2017-06-15 DIAGNOSIS — Z992 Dependence on renal dialysis: Secondary | ICD-10-CM | POA: Diagnosis not present

## 2017-06-16 DIAGNOSIS — Z23 Encounter for immunization: Secondary | ICD-10-CM | POA: Diagnosis not present

## 2017-06-16 DIAGNOSIS — Z992 Dependence on renal dialysis: Secondary | ICD-10-CM | POA: Diagnosis not present

## 2017-06-16 DIAGNOSIS — N186 End stage renal disease: Secondary | ICD-10-CM | POA: Diagnosis not present

## 2017-06-17 DIAGNOSIS — N186 End stage renal disease: Secondary | ICD-10-CM | POA: Diagnosis not present

## 2017-06-17 DIAGNOSIS — Z992 Dependence on renal dialysis: Secondary | ICD-10-CM | POA: Diagnosis not present

## 2017-06-17 DIAGNOSIS — Z23 Encounter for immunization: Secondary | ICD-10-CM | POA: Diagnosis not present

## 2017-06-18 DIAGNOSIS — Z992 Dependence on renal dialysis: Secondary | ICD-10-CM | POA: Diagnosis not present

## 2017-06-18 DIAGNOSIS — Z23 Encounter for immunization: Secondary | ICD-10-CM | POA: Diagnosis not present

## 2017-06-18 DIAGNOSIS — N186 End stage renal disease: Secondary | ICD-10-CM | POA: Diagnosis not present

## 2017-06-19 DIAGNOSIS — Z23 Encounter for immunization: Secondary | ICD-10-CM | POA: Diagnosis not present

## 2017-06-19 DIAGNOSIS — Z992 Dependence on renal dialysis: Secondary | ICD-10-CM | POA: Diagnosis not present

## 2017-06-19 DIAGNOSIS — N186 End stage renal disease: Secondary | ICD-10-CM | POA: Diagnosis not present

## 2017-06-20 DIAGNOSIS — N186 End stage renal disease: Secondary | ICD-10-CM | POA: Diagnosis not present

## 2017-06-20 DIAGNOSIS — Z992 Dependence on renal dialysis: Secondary | ICD-10-CM | POA: Diagnosis not present

## 2017-06-20 DIAGNOSIS — Z23 Encounter for immunization: Secondary | ICD-10-CM | POA: Diagnosis not present

## 2017-06-21 DIAGNOSIS — N186 End stage renal disease: Secondary | ICD-10-CM | POA: Diagnosis not present

## 2017-06-21 DIAGNOSIS — Z992 Dependence on renal dialysis: Secondary | ICD-10-CM | POA: Diagnosis not present

## 2017-06-21 DIAGNOSIS — Z23 Encounter for immunization: Secondary | ICD-10-CM | POA: Diagnosis not present

## 2017-06-22 DIAGNOSIS — Z23 Encounter for immunization: Secondary | ICD-10-CM | POA: Diagnosis not present

## 2017-06-22 DIAGNOSIS — N186 End stage renal disease: Secondary | ICD-10-CM | POA: Diagnosis not present

## 2017-06-22 DIAGNOSIS — Z992 Dependence on renal dialysis: Secondary | ICD-10-CM | POA: Diagnosis not present

## 2017-06-23 DIAGNOSIS — Z23 Encounter for immunization: Secondary | ICD-10-CM | POA: Diagnosis not present

## 2017-06-23 DIAGNOSIS — Z992 Dependence on renal dialysis: Secondary | ICD-10-CM | POA: Diagnosis not present

## 2017-06-23 DIAGNOSIS — N186 End stage renal disease: Secondary | ICD-10-CM | POA: Diagnosis not present

## 2017-06-24 DIAGNOSIS — Z992 Dependence on renal dialysis: Secondary | ICD-10-CM | POA: Diagnosis not present

## 2017-06-24 DIAGNOSIS — N186 End stage renal disease: Secondary | ICD-10-CM | POA: Diagnosis not present

## 2017-06-24 DIAGNOSIS — Z23 Encounter for immunization: Secondary | ICD-10-CM | POA: Diagnosis not present

## 2017-06-25 DIAGNOSIS — Z23 Encounter for immunization: Secondary | ICD-10-CM | POA: Diagnosis not present

## 2017-06-25 DIAGNOSIS — Z992 Dependence on renal dialysis: Secondary | ICD-10-CM | POA: Diagnosis not present

## 2017-06-25 DIAGNOSIS — N186 End stage renal disease: Secondary | ICD-10-CM | POA: Diagnosis not present

## 2017-06-26 ENCOUNTER — Other Ambulatory Visit: Payer: Self-pay

## 2017-06-26 DIAGNOSIS — Z23 Encounter for immunization: Secondary | ICD-10-CM | POA: Diagnosis not present

## 2017-06-26 DIAGNOSIS — Z992 Dependence on renal dialysis: Secondary | ICD-10-CM | POA: Diagnosis not present

## 2017-06-26 DIAGNOSIS — N186 End stage renal disease: Secondary | ICD-10-CM | POA: Diagnosis not present

## 2017-06-26 MED ORDER — METOPROLOL SUCCINATE ER 25 MG PO TB24: ORAL_TABLET | ORAL | 3 refills | Status: DC

## 2017-06-26 MED ORDER — GLUCOSE BLOOD VI STRP: ORAL_STRIP | 3 refills | Status: DC

## 2017-06-26 MED ORDER — ACCU-CHEK NANO SMARTVIEW W/DEVICE KIT: 1.0000 | PACK | Freq: Once | 0 refills | Status: AC

## 2017-06-26 MED ORDER — ALLOPURINOL 300 MG PO TABS: ORAL_TABLET | ORAL | 3 refills | Status: DC

## 2017-06-26 MED ORDER — ACCU-CHEK FASTCLIX LANCET KIT: 1.0000 | PACK | Freq: Once | 0 refills | Status: AC

## 2017-06-26 MED ORDER — FLUTICASONE PROPIONATE 50 MCG/ACT NA SUSP: 2.0000 | Freq: Every day | NASAL | 3 refills | Status: DC

## 2017-06-26 MED ORDER — AMLODIPINE BESYLATE 10 MG PO TABS: ORAL_TABLET | ORAL | 3 refills | Status: DC

## 2017-06-26 MED ORDER — ACCU-CHEK SMARTVIEW CONTROL VI LIQD: 1.0000 | 3 refills | Status: DC | PRN

## 2017-06-26 MED ORDER — ACCU-CHEK FASTCLIX LANCETS MISC: 1.0000 [IU] | Freq: Every day | 3 refills | Status: DC

## 2017-06-26 MED ORDER — ALCOHOL PREP PADS: 1.0000 [IU] | MEDICATED_PAD | Freq: Every day | 3 refills | Status: AC

## 2017-06-26 MED ORDER — CITALOPRAM HYDROBROMIDE 20 MG PO TABS: ORAL_TABLET | ORAL | 3 refills | Status: DC

## 2017-06-26 NOTE — Telephone Encounter (Signed)
Multiple refills requested from [place]. Verified with pt because we have never used [facility] for him. He said he does want to use them. Rxs sent.

## 2017-06-27 DIAGNOSIS — Z992 Dependence on renal dialysis: Secondary | ICD-10-CM | POA: Diagnosis not present

## 2017-06-27 DIAGNOSIS — N186 End stage renal disease: Secondary | ICD-10-CM | POA: Diagnosis not present

## 2017-06-27 DIAGNOSIS — Z23 Encounter for immunization: Secondary | ICD-10-CM | POA: Diagnosis not present

## 2017-06-27 DIAGNOSIS — E119 Type 2 diabetes mellitus without complications: Secondary | ICD-10-CM | POA: Diagnosis not present

## 2017-06-28 ENCOUNTER — Other Ambulatory Visit: Payer: Self-pay

## 2017-06-28 DIAGNOSIS — Z23 Encounter for immunization: Secondary | ICD-10-CM | POA: Diagnosis not present

## 2017-06-28 DIAGNOSIS — E8809 Other disorders of plasma-protein metabolism, not elsewhere classified: Secondary | ICD-10-CM | POA: Diagnosis not present

## 2017-06-28 DIAGNOSIS — N186 End stage renal disease: Secondary | ICD-10-CM | POA: Diagnosis not present

## 2017-06-28 DIAGNOSIS — E46 Unspecified protein-calorie malnutrition: Secondary | ICD-10-CM | POA: Diagnosis not present

## 2017-06-28 DIAGNOSIS — Z992 Dependence on renal dialysis: Secondary | ICD-10-CM | POA: Diagnosis not present

## 2017-06-28 MED ORDER — ACCU-CHEK NANO SMARTVIEW W/DEVICE KIT: 1.0000 | PACK | Freq: Once | 0 refills | Status: AC

## 2017-06-29 DIAGNOSIS — Z23 Encounter for immunization: Secondary | ICD-10-CM | POA: Diagnosis not present

## 2017-06-29 DIAGNOSIS — N186 End stage renal disease: Secondary | ICD-10-CM | POA: Diagnosis not present

## 2017-06-29 DIAGNOSIS — Z992 Dependence on renal dialysis: Secondary | ICD-10-CM | POA: Diagnosis not present

## 2017-06-30 DIAGNOSIS — E8809 Other disorders of plasma-protein metabolism, not elsewhere classified: Secondary | ICD-10-CM | POA: Diagnosis not present

## 2017-06-30 DIAGNOSIS — E46 Unspecified protein-calorie malnutrition: Secondary | ICD-10-CM | POA: Diagnosis not present

## 2017-06-30 DIAGNOSIS — N186 End stage renal disease: Secondary | ICD-10-CM | POA: Diagnosis not present

## 2017-06-30 DIAGNOSIS — Z992 Dependence on renal dialysis: Secondary | ICD-10-CM | POA: Diagnosis not present

## 2017-06-30 DIAGNOSIS — Z23 Encounter for immunization: Secondary | ICD-10-CM | POA: Diagnosis not present

## 2017-07-01 DIAGNOSIS — Z992 Dependence on renal dialysis: Secondary | ICD-10-CM | POA: Diagnosis not present

## 2017-07-01 DIAGNOSIS — Z23 Encounter for immunization: Secondary | ICD-10-CM | POA: Diagnosis not present

## 2017-07-01 DIAGNOSIS — N186 End stage renal disease: Secondary | ICD-10-CM | POA: Diagnosis not present

## 2017-07-02 DIAGNOSIS — Z23 Encounter for immunization: Secondary | ICD-10-CM | POA: Diagnosis not present

## 2017-07-02 DIAGNOSIS — Z992 Dependence on renal dialysis: Secondary | ICD-10-CM | POA: Diagnosis not present

## 2017-07-02 DIAGNOSIS — N186 End stage renal disease: Secondary | ICD-10-CM | POA: Diagnosis not present

## 2017-07-03 ENCOUNTER — Telehealth: Payer: Self-pay | Admitting: Urology

## 2017-07-03 DIAGNOSIS — N401 Enlarged prostate with lower urinary tract symptoms: Secondary | ICD-10-CM

## 2017-07-03 DIAGNOSIS — Z23 Encounter for immunization: Secondary | ICD-10-CM | POA: Diagnosis not present

## 2017-07-03 DIAGNOSIS — N186 End stage renal disease: Secondary | ICD-10-CM | POA: Diagnosis not present

## 2017-07-03 DIAGNOSIS — Z992 Dependence on renal dialysis: Secondary | ICD-10-CM | POA: Diagnosis not present

## 2017-07-03 MED ORDER — TAMSULOSIN HCL 0.4 MG PO CAPS: 0.4000 mg | ORAL_CAPSULE | Freq: Every day | ORAL | 3 refills | Status: DC

## 2017-07-03 NOTE — Telephone Encounter (Signed)
Misty from [place] [phone]) called the office and left a voice mail regarding the patient's request for a 90-day supply of tamuslosin.  Please advise.

## 2017-07-03 NOTE — Telephone Encounter (Signed)
Script resent

## 2017-07-04 DIAGNOSIS — N186 End stage renal disease: Secondary | ICD-10-CM | POA: Diagnosis not present

## 2017-07-04 DIAGNOSIS — Z23 Encounter for immunization: Secondary | ICD-10-CM | POA: Diagnosis not present

## 2017-07-04 DIAGNOSIS — Z992 Dependence on renal dialysis: Secondary | ICD-10-CM | POA: Diagnosis not present

## 2017-07-05 DIAGNOSIS — Z992 Dependence on renal dialysis: Secondary | ICD-10-CM | POA: Diagnosis not present

## 2017-07-05 DIAGNOSIS — Z23 Encounter for immunization: Secondary | ICD-10-CM | POA: Diagnosis not present

## 2017-07-05 DIAGNOSIS — N186 End stage renal disease: Secondary | ICD-10-CM | POA: Diagnosis not present

## 2017-07-06 DIAGNOSIS — Z992 Dependence on renal dialysis: Secondary | ICD-10-CM | POA: Diagnosis not present

## 2017-07-06 DIAGNOSIS — Z23 Encounter for immunization: Secondary | ICD-10-CM | POA: Diagnosis not present

## 2017-07-06 DIAGNOSIS — N186 End stage renal disease: Secondary | ICD-10-CM | POA: Diagnosis not present

## 2017-07-07 DIAGNOSIS — Z23 Encounter for immunization: Secondary | ICD-10-CM | POA: Diagnosis not present

## 2017-07-07 DIAGNOSIS — Z992 Dependence on renal dialysis: Secondary | ICD-10-CM | POA: Diagnosis not present

## 2017-07-07 DIAGNOSIS — N186 End stage renal disease: Secondary | ICD-10-CM | POA: Diagnosis not present

## 2017-07-08 ENCOUNTER — Other Ambulatory Visit: Payer: Self-pay

## 2017-07-08 DIAGNOSIS — Z23 Encounter for immunization: Secondary | ICD-10-CM | POA: Diagnosis not present

## 2017-07-08 DIAGNOSIS — Z992 Dependence on renal dialysis: Secondary | ICD-10-CM | POA: Diagnosis not present

## 2017-07-08 DIAGNOSIS — N186 End stage renal disease: Secondary | ICD-10-CM | POA: Diagnosis not present

## 2017-07-08 MED ORDER — ATORVASTATIN CALCIUM 20 MG PO TABS: ORAL_TABLET | ORAL | 2 refills | Status: DC

## 2017-07-08 NOTE — Telephone Encounter (Signed)
Mrs Richard Chen (DPR signed) left v/m requesting refill atorvastatin to [facility]. Last lipid and seen 03/06/17. Refilled per protocol and Mrs Sellitto voiced understanding.

## 2017-07-09 DIAGNOSIS — Z23 Encounter for immunization: Secondary | ICD-10-CM | POA: Diagnosis not present

## 2017-07-09 DIAGNOSIS — N186 End stage renal disease: Secondary | ICD-10-CM | POA: Diagnosis not present

## 2017-07-09 DIAGNOSIS — Z992 Dependence on renal dialysis: Secondary | ICD-10-CM | POA: Diagnosis not present

## 2017-07-10 DIAGNOSIS — N186 End stage renal disease: Secondary | ICD-10-CM | POA: Diagnosis not present

## 2017-07-10 DIAGNOSIS — Z7682 Awaiting organ transplant status: Secondary | ICD-10-CM | POA: Diagnosis not present

## 2017-07-10 DIAGNOSIS — Z992 Dependence on renal dialysis: Secondary | ICD-10-CM | POA: Diagnosis not present

## 2017-07-11 DIAGNOSIS — N186 End stage renal disease: Secondary | ICD-10-CM | POA: Diagnosis not present

## 2017-07-11 DIAGNOSIS — Z992 Dependence on renal dialysis: Secondary | ICD-10-CM | POA: Diagnosis not present

## 2017-07-12 DIAGNOSIS — N186 End stage renal disease: Secondary | ICD-10-CM | POA: Diagnosis not present

## 2017-07-12 DIAGNOSIS — Z992 Dependence on renal dialysis: Secondary | ICD-10-CM | POA: Diagnosis not present

## 2017-07-13 DIAGNOSIS — N186 End stage renal disease: Secondary | ICD-10-CM | POA: Diagnosis not present

## 2017-07-13 DIAGNOSIS — Z992 Dependence on renal dialysis: Secondary | ICD-10-CM | POA: Diagnosis not present

## 2017-07-14 DIAGNOSIS — Z992 Dependence on renal dialysis: Secondary | ICD-10-CM | POA: Diagnosis not present

## 2017-07-14 DIAGNOSIS — N186 End stage renal disease: Secondary | ICD-10-CM | POA: Diagnosis not present

## 2017-07-14 DIAGNOSIS — E46 Unspecified protein-calorie malnutrition: Secondary | ICD-10-CM | POA: Diagnosis not present

## 2017-07-14 DIAGNOSIS — E8809 Other disorders of plasma-protein metabolism, not elsewhere classified: Secondary | ICD-10-CM | POA: Diagnosis not present

## 2017-07-15 DIAGNOSIS — N186 End stage renal disease: Secondary | ICD-10-CM | POA: Diagnosis not present

## 2017-07-15 DIAGNOSIS — Z992 Dependence on renal dialysis: Secondary | ICD-10-CM | POA: Diagnosis not present

## 2017-07-16 DIAGNOSIS — N186 End stage renal disease: Secondary | ICD-10-CM | POA: Diagnosis not present

## 2017-07-16 DIAGNOSIS — Z992 Dependence on renal dialysis: Secondary | ICD-10-CM | POA: Diagnosis not present

## 2017-07-17 DIAGNOSIS — N186 End stage renal disease: Secondary | ICD-10-CM | POA: Diagnosis not present

## 2017-07-17 DIAGNOSIS — Z992 Dependence on renal dialysis: Secondary | ICD-10-CM | POA: Diagnosis not present

## 2017-07-18 DIAGNOSIS — Z992 Dependence on renal dialysis: Secondary | ICD-10-CM | POA: Diagnosis not present

## 2017-07-18 DIAGNOSIS — N186 End stage renal disease: Secondary | ICD-10-CM | POA: Diagnosis not present

## 2017-07-19 DIAGNOSIS — Z992 Dependence on renal dialysis: Secondary | ICD-10-CM | POA: Diagnosis not present

## 2017-07-19 DIAGNOSIS — N186 End stage renal disease: Secondary | ICD-10-CM | POA: Diagnosis not present

## 2017-07-20 DIAGNOSIS — N186 End stage renal disease: Secondary | ICD-10-CM | POA: Diagnosis not present

## 2017-07-20 DIAGNOSIS — Z992 Dependence on renal dialysis: Secondary | ICD-10-CM | POA: Diagnosis not present

## 2017-07-21 DIAGNOSIS — E46 Unspecified protein-calorie malnutrition: Secondary | ICD-10-CM | POA: Diagnosis not present

## 2017-07-21 DIAGNOSIS — N186 End stage renal disease: Secondary | ICD-10-CM | POA: Diagnosis not present

## 2017-07-21 DIAGNOSIS — Z992 Dependence on renal dialysis: Secondary | ICD-10-CM | POA: Diagnosis not present

## 2017-07-21 DIAGNOSIS — E8809 Other disorders of plasma-protein metabolism, not elsewhere classified: Secondary | ICD-10-CM | POA: Diagnosis not present

## 2017-07-22 DIAGNOSIS — Z992 Dependence on renal dialysis: Secondary | ICD-10-CM | POA: Diagnosis not present

## 2017-07-22 DIAGNOSIS — N186 End stage renal disease: Secondary | ICD-10-CM | POA: Diagnosis not present

## 2017-07-22 DIAGNOSIS — Z23 Encounter for immunization: Secondary | ICD-10-CM | POA: Diagnosis not present

## 2017-07-23 DIAGNOSIS — Z23 Encounter for immunization: Secondary | ICD-10-CM | POA: Diagnosis not present

## 2017-07-23 DIAGNOSIS — N186 End stage renal disease: Secondary | ICD-10-CM | POA: Diagnosis not present

## 2017-07-23 DIAGNOSIS — Z992 Dependence on renal dialysis: Secondary | ICD-10-CM | POA: Diagnosis not present

## 2017-07-24 ENCOUNTER — Other Ambulatory Visit: Payer: Self-pay | Admitting: Nephrology

## 2017-07-24 DIAGNOSIS — Z992 Dependence on renal dialysis: Secondary | ICD-10-CM | POA: Diagnosis not present

## 2017-07-24 DIAGNOSIS — E785 Hyperlipidemia, unspecified: Secondary | ICD-10-CM | POA: Diagnosis not present

## 2017-07-24 DIAGNOSIS — E119 Type 2 diabetes mellitus without complications: Secondary | ICD-10-CM | POA: Diagnosis not present

## 2017-07-24 DIAGNOSIS — Z1159 Encounter for screening for other viral diseases: Secondary | ICD-10-CM | POA: Diagnosis not present

## 2017-07-24 DIAGNOSIS — N186 End stage renal disease: Secondary | ICD-10-CM | POA: Diagnosis not present

## 2017-07-24 DIAGNOSIS — T8089XS Other complications following infusion, transfusion and therapeutic injection, sequela: Secondary | ICD-10-CM

## 2017-07-24 DIAGNOSIS — Z23 Encounter for immunization: Secondary | ICD-10-CM | POA: Diagnosis not present

## 2017-07-25 DIAGNOSIS — Z992 Dependence on renal dialysis: Secondary | ICD-10-CM | POA: Diagnosis not present

## 2017-07-25 DIAGNOSIS — N186 End stage renal disease: Secondary | ICD-10-CM | POA: Diagnosis not present

## 2017-07-25 DIAGNOSIS — E8809 Other disorders of plasma-protein metabolism, not elsewhere classified: Secondary | ICD-10-CM | POA: Diagnosis not present

## 2017-07-25 DIAGNOSIS — E46 Unspecified protein-calorie malnutrition: Secondary | ICD-10-CM | POA: Diagnosis not present

## 2017-07-26 ENCOUNTER — Ambulatory Visit
Admission: RE | Admit: 2017-07-26 | Discharge: 2017-07-26 | Disposition: A | Discharge status: Home / Self Care | Payer: Medicare HMO | Source: Ambulatory Visit | Attending: Nephrology | Admitting: Nephrology

## 2017-07-26 DIAGNOSIS — Z992 Dependence on renal dialysis: Secondary | ICD-10-CM | POA: Diagnosis not present

## 2017-07-26 DIAGNOSIS — N186 End stage renal disease: Secondary | ICD-10-CM | POA: Insufficient documentation

## 2017-07-26 DIAGNOSIS — N433 Hydrocele, unspecified: Secondary | ICD-10-CM | POA: Insufficient documentation

## 2017-07-26 DIAGNOSIS — R109 Unspecified abdominal pain: Secondary | ICD-10-CM | POA: Diagnosis not present

## 2017-07-26 DIAGNOSIS — K661 Hemoperitoneum: Secondary | ICD-10-CM | POA: Insufficient documentation

## 2017-07-26 DIAGNOSIS — X58XXXS Exposure to other specified factors, sequela: Secondary | ICD-10-CM | POA: Insufficient documentation

## 2017-07-26 DIAGNOSIS — T8089XS Other complications following infusion, transfusion and therapeutic injection, sequela: Secondary | ICD-10-CM | POA: Diagnosis not present

## 2017-07-27 DIAGNOSIS — N186 End stage renal disease: Secondary | ICD-10-CM | POA: Diagnosis not present

## 2017-07-27 DIAGNOSIS — Z992 Dependence on renal dialysis: Secondary | ICD-10-CM | POA: Diagnosis not present

## 2017-07-29 DIAGNOSIS — Z992 Dependence on renal dialysis: Secondary | ICD-10-CM | POA: Diagnosis not present

## 2017-07-30 DIAGNOSIS — Z992 Dependence on renal dialysis: Secondary | ICD-10-CM | POA: Diagnosis not present

## 2017-07-30 DIAGNOSIS — N186 End stage renal disease: Secondary | ICD-10-CM | POA: Diagnosis not present

## 2017-08-01 DIAGNOSIS — N186 End stage renal disease: Secondary | ICD-10-CM | POA: Diagnosis not present

## 2017-08-01 DIAGNOSIS — Z992 Dependence on renal dialysis: Secondary | ICD-10-CM | POA: Diagnosis not present

## 2017-08-03 DIAGNOSIS — N186 End stage renal disease: Secondary | ICD-10-CM | POA: Diagnosis not present

## 2017-08-03 DIAGNOSIS — Z23 Encounter for immunization: Secondary | ICD-10-CM | POA: Diagnosis not present

## 2017-08-03 DIAGNOSIS — Z992 Dependence on renal dialysis: Secondary | ICD-10-CM | POA: Diagnosis not present

## 2017-08-06 ENCOUNTER — Encounter: Payer: Self-pay | Admitting: Internal Medicine

## 2017-08-06 DIAGNOSIS — N186 End stage renal disease: Secondary | ICD-10-CM | POA: Diagnosis not present

## 2017-08-06 DIAGNOSIS — Z992 Dependence on renal dialysis: Secondary | ICD-10-CM | POA: Diagnosis not present

## 2017-08-06 DIAGNOSIS — Z23 Encounter for immunization: Secondary | ICD-10-CM | POA: Diagnosis not present

## 2017-08-08 DIAGNOSIS — N186 End stage renal disease: Secondary | ICD-10-CM | POA: Diagnosis not present

## 2017-08-08 DIAGNOSIS — Z992 Dependence on renal dialysis: Secondary | ICD-10-CM | POA: Diagnosis not present

## 2017-08-08 DIAGNOSIS — Z23 Encounter for immunization: Secondary | ICD-10-CM | POA: Diagnosis not present

## 2017-08-09 ENCOUNTER — Ambulatory Visit: Payer: Medicare HMO

## 2017-08-10 DIAGNOSIS — Z23 Encounter for immunization: Secondary | ICD-10-CM | POA: Diagnosis not present

## 2017-08-10 DIAGNOSIS — N186 End stage renal disease: Secondary | ICD-10-CM | POA: Diagnosis not present

## 2017-08-10 DIAGNOSIS — Z992 Dependence on renal dialysis: Secondary | ICD-10-CM | POA: Diagnosis not present

## 2017-08-13 DIAGNOSIS — N186 End stage renal disease: Secondary | ICD-10-CM | POA: Diagnosis not present

## 2017-08-13 DIAGNOSIS — Z23 Encounter for immunization: Secondary | ICD-10-CM | POA: Diagnosis not present

## 2017-08-13 DIAGNOSIS — Z992 Dependence on renal dialysis: Secondary | ICD-10-CM | POA: Diagnosis not present

## 2017-08-13 IMAGING — CR DG ABDOMEN 1V
1 series · 2 of 2 positions shown · non-contrast
Comparison: Radiographs July 19, 2016.

CLINICAL DATA: Peritoneal dialysis catheter placement.

EXAM:
ABDOMEN - 1 VIEW

[Series 1: dg abd 1 view · 0.14mm/px · 2 of 2 slices shown]
[im 1/2]
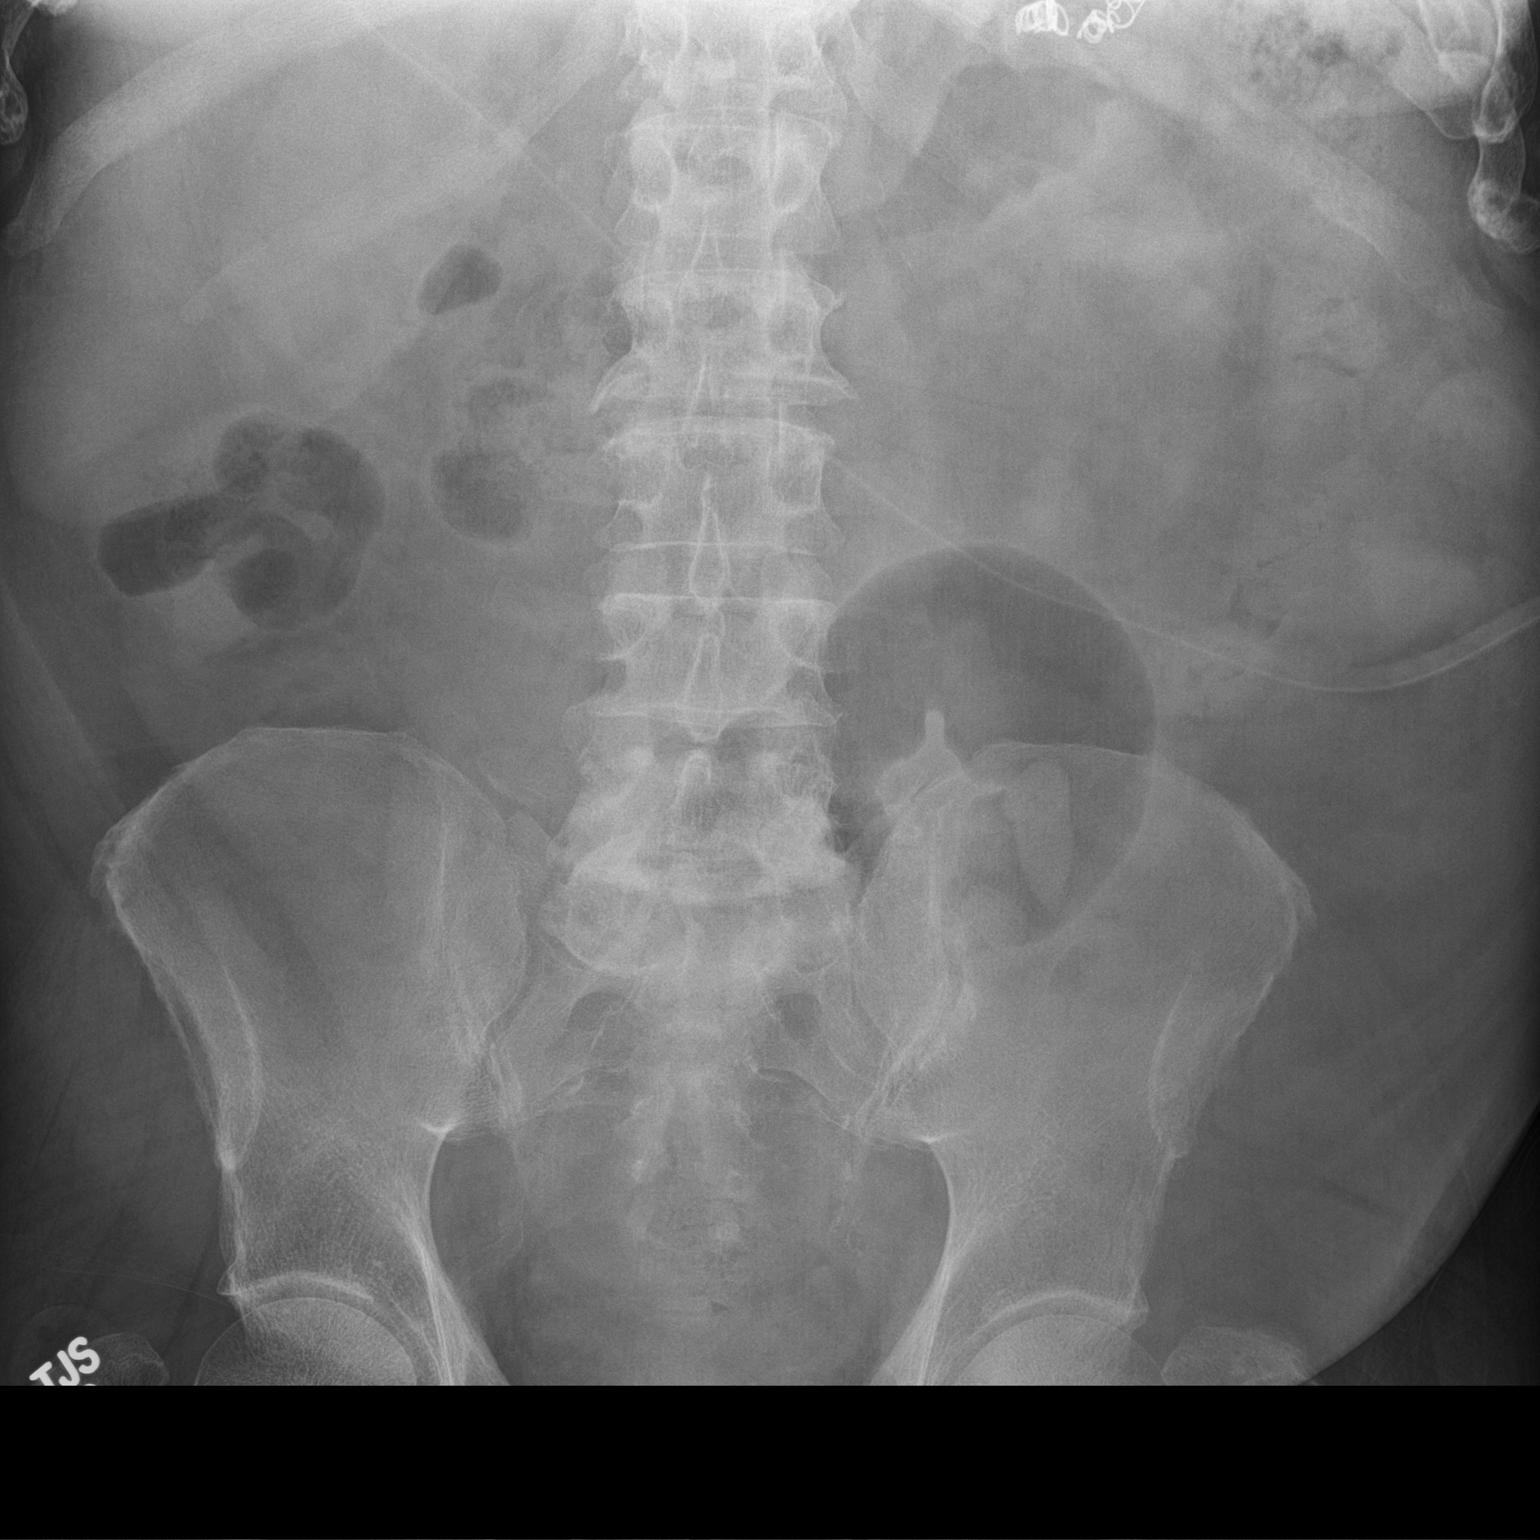
[im 2/2]
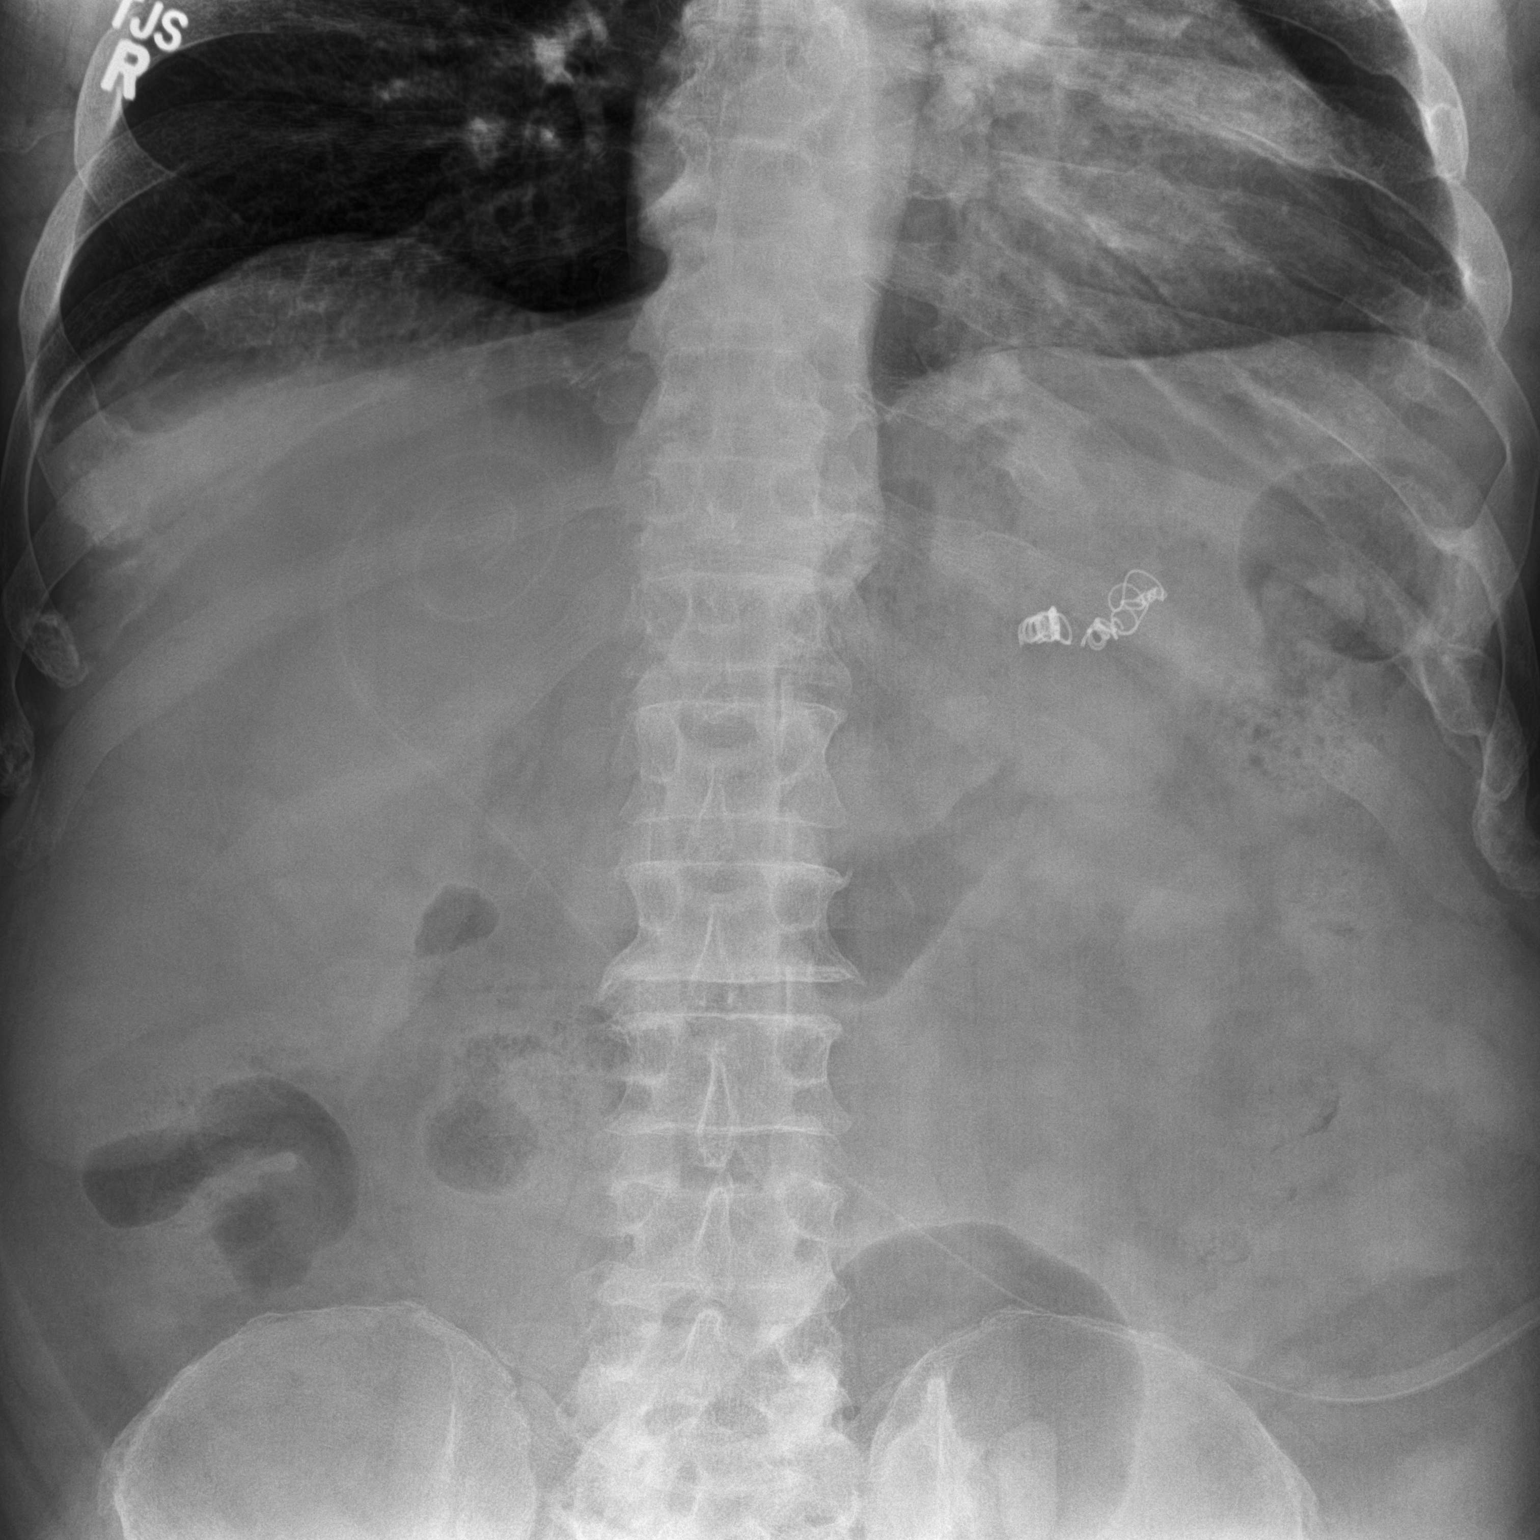

[2 of 2 positions shown; findings below may reference images not displayed]

FINDINGS: The bowel gas pattern is normal. Embolization coils are noted in the
left upper quadrant. Peritoneal dialysis catheter tip is seen in the
right upper quadrant of the abdomen.
IMPRESSION: Peritoneal dialysis catheter tip has migrated to right upper
quadrant of abdomen.

## 2017-08-14 DIAGNOSIS — Z992 Dependence on renal dialysis: Secondary | ICD-10-CM | POA: Diagnosis not present

## 2017-08-14 DIAGNOSIS — N186 End stage renal disease: Secondary | ICD-10-CM | POA: Diagnosis not present

## 2017-08-14 DIAGNOSIS — I10 Essential (primary) hypertension: Secondary | ICD-10-CM | POA: Diagnosis not present

## 2017-08-14 DIAGNOSIS — R001 Bradycardia, unspecified: Secondary | ICD-10-CM | POA: Diagnosis not present

## 2017-08-14 DIAGNOSIS — Z01810 Encounter for preprocedural cardiovascular examination: Secondary | ICD-10-CM | POA: Diagnosis not present

## 2017-08-14 DIAGNOSIS — E7800 Pure hypercholesterolemia, unspecified: Secondary | ICD-10-CM | POA: Diagnosis not present

## 2017-08-14 DIAGNOSIS — R600 Localized edema: Secondary | ICD-10-CM | POA: Diagnosis not present

## 2017-08-14 DIAGNOSIS — R0602 Shortness of breath: Secondary | ICD-10-CM | POA: Diagnosis not present

## 2017-08-15 DIAGNOSIS — N186 End stage renal disease: Secondary | ICD-10-CM | POA: Diagnosis not present

## 2017-08-15 DIAGNOSIS — Z992 Dependence on renal dialysis: Secondary | ICD-10-CM | POA: Diagnosis not present

## 2017-08-15 DIAGNOSIS — Z23 Encounter for immunization: Secondary | ICD-10-CM | POA: Diagnosis not present

## 2017-08-17 ENCOUNTER — Other Ambulatory Visit
Admission: RE | Admit: 2017-08-17 | Discharge: 2017-08-17 | Disposition: A | Discharge status: Home / Self Care | Payer: Medicare HMO | Source: Ambulatory Visit | Attending: Nephrology | Admitting: Nephrology

## 2017-08-17 DIAGNOSIS — N186 End stage renal disease: Secondary | ICD-10-CM | POA: Diagnosis not present

## 2017-08-17 LAB — POTASSIUM: POTASSIUM: 4.4 mmol/L (ref 3.5–5.1)

## 2017-08-20 DIAGNOSIS — I871 Compression of vein: Secondary | ICD-10-CM | POA: Diagnosis not present

## 2017-08-20 DIAGNOSIS — Z23 Encounter for immunization: Secondary | ICD-10-CM | POA: Diagnosis not present

## 2017-08-20 DIAGNOSIS — Z992 Dependence on renal dialysis: Secondary | ICD-10-CM | POA: Diagnosis not present

## 2017-08-20 DIAGNOSIS — T82868A Thrombosis of vascular prosthetic devices, implants and grafts, initial encounter: Secondary | ICD-10-CM | POA: Diagnosis not present

## 2017-08-20 DIAGNOSIS — N186 End stage renal disease: Secondary | ICD-10-CM | POA: Diagnosis not present

## 2017-08-21 DIAGNOSIS — Z992 Dependence on renal dialysis: Secondary | ICD-10-CM | POA: Diagnosis not present

## 2017-08-21 DIAGNOSIS — N186 End stage renal disease: Secondary | ICD-10-CM | POA: Diagnosis not present

## 2017-08-22 DIAGNOSIS — Z992 Dependence on renal dialysis: Secondary | ICD-10-CM | POA: Diagnosis not present

## 2017-08-22 DIAGNOSIS — N186 End stage renal disease: Secondary | ICD-10-CM | POA: Diagnosis not present

## 2017-08-23 ENCOUNTER — Encounter (INDEPENDENT_AMBULATORY_CARE_PROVIDER_SITE_OTHER): Payer: Self-pay | Admitting: Vascular Surgery

## 2017-08-23 ENCOUNTER — Ambulatory Visit (INDEPENDENT_AMBULATORY_CARE_PROVIDER_SITE_OTHER): Payer: Medicare HMO | Admitting: Vascular Surgery

## 2017-08-23 VITALS — BP 156/62 | HR 58 | Resp 16 | Ht 73.5 in | Wt 259.0 lb

## 2017-08-23 DIAGNOSIS — N186 End stage renal disease: Secondary | ICD-10-CM | POA: Diagnosis not present

## 2017-08-23 DIAGNOSIS — K439 Ventral hernia without obstruction or gangrene: Secondary | ICD-10-CM | POA: Diagnosis not present

## 2017-08-23 DIAGNOSIS — Z992 Dependence on renal dialysis: Secondary | ICD-10-CM

## 2017-08-23 DIAGNOSIS — T829XXS Unspecified complication of cardiac and vascular prosthetic device, implant and graft, sequela: Secondary | ICD-10-CM | POA: Diagnosis not present
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
## 2017-08-24 DIAGNOSIS — Z992 Dependence on renal dialysis: Secondary | ICD-10-CM | POA: Diagnosis not present

## 2017-08-24 DIAGNOSIS — N186 End stage renal disease: Secondary | ICD-10-CM | POA: Diagnosis not present

## 2017-08-26 ENCOUNTER — Telehealth: Payer: Self-pay | Admitting: General Surgery

## 2017-08-26 NOTE — Telephone Encounter (Signed)
Returned call to patients wife at this time. I advised her that we would need to see him in office prior to scheduling his surgery. She verbalized understanding. I offered her an appointment for 11/7 at 9:15AM she accepted. I told her that this visit will be a consultation and that surgery date will be scheduled at this time. She verbalized understanding.

## 2017-08-26 NOTE — Telephone Encounter (Signed)
On Friday pt went to see Dr. Lucky Cowboy. Patient's wife would like a call regarding visit to discuss surgery date. Please advise

## 2017-08-27 DIAGNOSIS — Z992 Dependence on renal dialysis: Secondary | ICD-10-CM | POA: Diagnosis not present

## 2017-08-27 DIAGNOSIS — N186 End stage renal disease: Secondary | ICD-10-CM | POA: Diagnosis not present

## 2017-08-28 ENCOUNTER — Telehealth: Payer: Self-pay | Admitting: General Surgery

## 2017-08-28 ENCOUNTER — Ambulatory Visit (INDEPENDENT_AMBULATORY_CARE_PROVIDER_SITE_OTHER): Payer: Medicare HMO | Admitting: General Surgery

## 2017-08-28 ENCOUNTER — Encounter: Payer: Self-pay | Admitting: General Surgery

## 2017-08-28 DIAGNOSIS — R1900 Intra-abdominal and pelvic swelling, mass and lump, unspecified site: Secondary | ICD-10-CM | POA: Diagnosis not present

## 2017-08-28 NOTE — Telephone Encounter (Signed)
Richard Chen is calling from [facility] regarding this patient asked if we could give Richard Chen a call when we have schedule patient's surgery. She can be reached at [phone] extension [phone].
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
## 2017-08-28 NOTE — Patient Instructions (Signed)
Please call our office with any questions or concerns. 

## 2017-08-28 NOTE — Telephone Encounter (Signed)
Spoke with Mickel Baas at this time. No surgery at this time. Patient does not have any Hernia's according to CT scan.   Dr.Woodham will be in touch with Dr.Dew to discuss this patient.

## 2017-08-29 DIAGNOSIS — Z992 Dependence on renal dialysis: Secondary | ICD-10-CM | POA: Diagnosis not present

## 2017-08-29 DIAGNOSIS — N186 End stage renal disease: Secondary | ICD-10-CM | POA: Diagnosis not present

## 2017-08-31 DIAGNOSIS — Z992 Dependence on renal dialysis: Secondary | ICD-10-CM | POA: Diagnosis not present

## 2017-08-31 DIAGNOSIS — N186 End stage renal disease: Secondary | ICD-10-CM | POA: Diagnosis not present

## 2017-09-03 DIAGNOSIS — N186 End stage renal disease: Secondary | ICD-10-CM | POA: Diagnosis not present

## 2017-09-03 DIAGNOSIS — Z992 Dependence on renal dialysis: Secondary | ICD-10-CM | POA: Diagnosis not present

## 2017-09-05 DIAGNOSIS — N186 End stage renal disease: Secondary | ICD-10-CM | POA: Diagnosis not present

## 2017-09-05 DIAGNOSIS — Z992 Dependence on renal dialysis: Secondary | ICD-10-CM | POA: Diagnosis not present

## 2017-09-06 ENCOUNTER — Other Ambulatory Visit (INDEPENDENT_AMBULATORY_CARE_PROVIDER_SITE_OTHER): Payer: Self-pay

## 2017-09-07 DIAGNOSIS — N186 End stage renal disease: Secondary | ICD-10-CM | POA: Diagnosis not present

## 2017-09-07 DIAGNOSIS — Z992 Dependence on renal dialysis: Secondary | ICD-10-CM | POA: Diagnosis not present

## 2017-09-09 ENCOUNTER — Inpatient Hospital Stay: Admission: RE | Admit: 2017-09-09 | Payer: Medicare HMO | Source: Ambulatory Visit

## 2017-09-09 DIAGNOSIS — N186 End stage renal disease: Secondary | ICD-10-CM | POA: Diagnosis not present

## 2017-09-09 DIAGNOSIS — Z992 Dependence on renal dialysis: Secondary | ICD-10-CM | POA: Diagnosis not present

## 2017-09-11 ENCOUNTER — Ambulatory Visit: Payer: Medicare HMO | Admitting: Internal Medicine

## 2017-09-11 DIAGNOSIS — Z992 Dependence on renal dialysis: Secondary | ICD-10-CM | POA: Diagnosis not present

## 2017-09-11 DIAGNOSIS — N186 End stage renal disease: Secondary | ICD-10-CM | POA: Diagnosis not present

## 2017-09-14 DIAGNOSIS — N186 End stage renal disease: Secondary | ICD-10-CM | POA: Diagnosis not present

## 2017-09-14 DIAGNOSIS — Z992 Dependence on renal dialysis: Secondary | ICD-10-CM | POA: Diagnosis not present

## 2017-09-16 ENCOUNTER — Other Ambulatory Visit: Payer: Self-pay

## 2017-09-16 ENCOUNTER — Encounter
Admission: RE | Admit: 2017-09-16 | Discharge: 2017-09-16 | Disposition: A | Discharge status: Home / Self Care | Payer: Medicare HMO | Source: Ambulatory Visit | Attending: Vascular Surgery | Admitting: Vascular Surgery

## 2017-09-16 DIAGNOSIS — K432 Incisional hernia without obstruction or gangrene: Secondary | ICD-10-CM | POA: Diagnosis not present

## 2017-09-16 DIAGNOSIS — N186 End stage renal disease: Secondary | ICD-10-CM | POA: Diagnosis not present

## 2017-09-16 DIAGNOSIS — Z992 Dependence on renal dialysis: Secondary | ICD-10-CM | POA: Diagnosis not present

## 2017-09-16 DIAGNOSIS — Z7984 Long term (current) use of oral hypoglycemic drugs: Secondary | ICD-10-CM | POA: Diagnosis not present

## 2017-09-16 DIAGNOSIS — Z4902 Encounter for fitting and adjustment of peritoneal dialysis catheter: Secondary | ICD-10-CM | POA: Diagnosis not present

## 2017-09-16 DIAGNOSIS — I120 Hypertensive chronic kidney disease with stage 5 chronic kidney disease or end stage renal disease: Secondary | ICD-10-CM | POA: Diagnosis not present

## 2017-09-16 DIAGNOSIS — E1122 Type 2 diabetes mellitus with diabetic chronic kidney disease: Secondary | ICD-10-CM | POA: Diagnosis not present

## 2017-09-16 DIAGNOSIS — Z841 Family history of disorders of kidney and ureter: Secondary | ICD-10-CM | POA: Diagnosis not present

## 2017-09-16 LAB — CBC
HEMATOCRIT: 33.9 % — AB (ref 40.0–52.0)
Hemoglobin: 10.9 g/dL — ABNORMAL LOW (ref 13.0–18.0)
MCH: 30.3 pg (ref 26.0–34.0)
MCHC: 32.1 g/dL (ref 32.0–36.0)
MCV: 94.3 fL (ref 80.0–100.0)
PLATELETS: 146 10*3/uL — AB (ref 150–440)
RBC: 3.6 MIL/uL — ABNORMAL LOW (ref 4.40–5.90)
RDW: 17.8 % — AB (ref 11.5–14.5)
WBC: 4.1 10*3/uL (ref 3.8–10.6)

## 2017-09-16 LAB — SURGICAL PCR SCREEN
MRSA, PCR: NEGATIVE
Staphylococcus aureus: POSITIVE — AB

## 2017-09-16 LAB — COMPREHENSIVE METABOLIC PANEL
ALBUMIN: 3.5 g/dL (ref 3.5–5.0)
ALK PHOS: 93 U/L (ref 38–126)
ALT: 23 U/L (ref 17–63)
ANION GAP: 11 (ref 5–15)
AST: 20 U/L (ref 15–41)
BILIRUBIN TOTAL: 0.6 mg/dL (ref 0.3–1.2)
BUN: 73 mg/dL — AB (ref 6–20)
CALCIUM: 8.8 mg/dL — AB (ref 8.9–10.3)
CO2: 26 mmol/L (ref 22–32)
CREATININE: 5.67 mg/dL — AB (ref 0.61–1.24)
Chloride: 102 mmol/L (ref 101–111)
GFR calc Af Amer: 11 mL/min — ABNORMAL LOW (ref >60)
GFR calc non Af Amer: 10 mL/min — ABNORMAL LOW (ref >60)
GLUCOSE: 119 mg/dL — AB (ref 65–99)
Potassium: 4.2 mmol/L (ref 3.5–5.1)
Sodium: 139 mmol/L (ref 135–145)
TOTAL PROTEIN: 7.3 g/dL (ref 6.5–8.1)

## 2017-09-16 LAB — APTT: aPTT: 31 seconds (ref 24–36)

## 2017-09-16 LAB — PROTIME-INR
INR: 0.98
Prothrombin Time: 12.9 seconds (ref 11.4–15.2)

## 2017-09-16 NOTE — Patient Instructions (Signed)
  Your procedure is scheduled on: September 18, 2017 Wednesday Report to second floor of the [facility] To find out your arrival time please call [phone] between 1PM - 3PM on Tuesday, September 17, 2017  Remember: Instructions that are not followed completely may result in serious medical risk, up to and including death, or upon the discretion of your surgeon and anesthesiologist your surgery may need to be rescheduled.     _X__ 1. Do not eat food after midnight the night before your procedure.                 No gum chewing or hard candies. You may drink clear liquids up to 2 hours                 before you are scheduled to arrive for your surgery- DO not drink clear                 liquids within 2 hours of the start of your surgery.                 Clear Liquids include:  water                    _X__ 2.  No Alcohol for 24 hours before or after surgery.   _X__ 3.  Do Not Smoke or use e-cigarettes For 24 Hours Prior to Your Surgery.                 Do not use any chewable tobacco products for at least 6 hours prior to                 surgery.  ___x_  4.  Bring all new medications with you on the day of surgery if instructed.   __x not__  5.  Notify your doctor if there is any change in your medical condition      (cold, fever, infections).     Do not wear jewelry, make-up, hairpins, clips or nail polish. Do not wear lotions, powders, or perfumes. You may wear not deodorant. Do not shave 48 hours prior to surgery. Men may shave face and neck. Do not bring valuables to the hospital.    [facility] is not responsible for any belongings or valuables.  Contacts, dentures or bridgework may not be worn into surgery. Leave your suitcase in the car. After surgery it may be brought to your room. For patients admitted to the hospital, discharge time is determined by your treatment team.   Patients discharged the day of surgery will not be allowed to drive  home.   Please read over the following fact sheets that you were given:   CHG instructions         __x__ Take these medicines the morning of surgery with A SIP OF WATER:    1. allopurinol  2. atorvastatin  3. citalopram  4. allegra  5. hydralazine  6. Metoprolol             7. Tamsulosin   Use flonase  _x___ Use CHG Soap as directed  ____ Stop metformin 2 days prior to surgery    __x__ Stop Anti-inflammatories until after surgery   __x__ Stop supplements until after surgery.

## 2017-09-17 ENCOUNTER — Ambulatory Visit (INDEPENDENT_AMBULATORY_CARE_PROVIDER_SITE_OTHER): Payer: Medicare HMO | Admitting: Internal Medicine

## 2017-09-17 ENCOUNTER — Encounter: Payer: Self-pay | Admitting: Internal Medicine

## 2017-09-17 VITALS — BP 152/78 | Wt 257.5 lb

## 2017-09-17 DIAGNOSIS — Z992 Dependence on renal dialysis: Secondary | ICD-10-CM

## 2017-09-17 DIAGNOSIS — N186 End stage renal disease: Secondary | ICD-10-CM | POA: Diagnosis not present

## 2017-09-17 DIAGNOSIS — I5032 Chronic diastolic (congestive) heart failure: Secondary | ICD-10-CM | POA: Diagnosis not present

## 2017-09-17 DIAGNOSIS — I10 Essential (primary) hypertension: Secondary | ICD-10-CM

## 2017-09-17 DIAGNOSIS — E1121 Type 2 diabetes mellitus with diabetic nephropathy: Secondary | ICD-10-CM

## 2017-09-17 MED ORDER — CEFAZOLIN SODIUM-DEXTROSE 2-4 GM/100ML-% IV SOLN
2.0000 g | Freq: Once | INTRAVENOUS | Status: AC
  Administered 2017-09-18: 2 g via INTRAVENOUS

## 2017-09-17 NOTE — Assessment & Plan Note (Signed)
No symptoms now since on dialysis

## 2017-09-17 NOTE — Assessment & Plan Note (Signed)
Doing well with dialysis at this point Getting peritoneal catheter out tomorrow
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
## 2017-09-17 NOTE — Assessment & Plan Note (Signed)
Has had good control He will get A1c again at dialysis next time they do blood work

## 2017-09-17 NOTE — Assessment & Plan Note (Signed)
BP Readings from Last 3 Encounters:  09/17/17 (!) 152/78  09/16/17 (!) 177/62  08/28/17 (!) 170/71   Up some but better Has had symptomatic hypotension in the past Will leave BP meds to Dr Holley Raring

## 2017-09-18 ENCOUNTER — Ambulatory Visit
Admission: RE | Admit: 2017-09-18 | Discharge: 2017-09-18 | Disposition: A | Discharge status: Home / Self Care | Payer: Medicare HMO | Source: Ambulatory Visit | Attending: Vascular Surgery | Admitting: Vascular Surgery

## 2017-09-18 ENCOUNTER — Encounter (INDEPENDENT_AMBULATORY_CARE_PROVIDER_SITE_OTHER): Payer: Medicare HMO

## 2017-09-18 ENCOUNTER — Encounter
Admission: RE | Disposition: A | Discharge status: Home / Self Care | Payer: Self-pay | Source: Ambulatory Visit | Attending: Vascular Surgery

## 2017-09-18 ENCOUNTER — Ambulatory Visit: Payer: Medicare HMO | Admitting: Anesthesiology

## 2017-09-18 ENCOUNTER — Encounter: Payer: Self-pay | Admitting: *Deleted

## 2017-09-18 ENCOUNTER — Ambulatory Visit (INDEPENDENT_AMBULATORY_CARE_PROVIDER_SITE_OTHER): Payer: Medicare HMO | Admitting: Vascular Surgery

## 2017-09-18 DIAGNOSIS — I120 Hypertensive chronic kidney disease with stage 5 chronic kidney disease or end stage renal disease: Secondary | ICD-10-CM | POA: Insufficient documentation

## 2017-09-18 DIAGNOSIS — Z7984 Long term (current) use of oral hypoglycemic drugs: Secondary | ICD-10-CM | POA: Diagnosis not present

## 2017-09-18 DIAGNOSIS — E1122 Type 2 diabetes mellitus with diabetic chronic kidney disease: Secondary | ICD-10-CM | POA: Diagnosis not present

## 2017-09-18 DIAGNOSIS — Z4902 Encounter for fitting and adjustment of peritoneal dialysis catheter: Secondary | ICD-10-CM | POA: Diagnosis not present

## 2017-09-18 DIAGNOSIS — Z992 Dependence on renal dialysis: Secondary | ICD-10-CM | POA: Diagnosis not present

## 2017-09-18 DIAGNOSIS — I132 Hypertensive heart and chronic kidney disease with heart failure and with stage 5 chronic kidney disease, or end stage renal disease: Secondary | ICD-10-CM | POA: Diagnosis not present

## 2017-09-18 DIAGNOSIS — K432 Incisional hernia without obstruction or gangrene: Secondary | ICD-10-CM | POA: Insufficient documentation

## 2017-09-18 DIAGNOSIS — N186 End stage renal disease: Secondary | ICD-10-CM | POA: Diagnosis not present

## 2017-09-18 DIAGNOSIS — I509 Heart failure, unspecified: Secondary | ICD-10-CM | POA: Diagnosis not present

## 2017-09-18 DIAGNOSIS — D649 Anemia, unspecified: Secondary | ICD-10-CM | POA: Diagnosis not present

## 2017-09-18 DIAGNOSIS — F419 Anxiety disorder, unspecified: Secondary | ICD-10-CM | POA: Diagnosis not present

## 2017-09-18 DIAGNOSIS — F329 Major depressive disorder, single episode, unspecified: Secondary | ICD-10-CM | POA: Diagnosis not present

## 2017-09-18 DIAGNOSIS — Z841 Family history of disorders of kidney and ureter: Secondary | ICD-10-CM | POA: Insufficient documentation

## 2017-09-18 HISTORY — PX: REMOVAL OF A DIALYSIS CATHETER: SHX6053

## 2017-09-18 LAB — GLUCOSE, CAPILLARY
Glucose-Capillary: 101 mg/dL — ABNORMAL HIGH (ref 65–99)
Glucose-Capillary: 91 mg/dL (ref 65–99)

## 2017-09-18 LAB — POCT I-STAT 4, (NA,K, GLUC, HGB,HCT)
Glucose, Bld: 109 mg/dL — ABNORMAL HIGH (ref 65–99)
HCT: 35 % — ABNORMAL LOW (ref 39.0–52.0)
HEMOGLOBIN: 11.9 g/dL — AB (ref 13.0–17.0)
POTASSIUM: 4.2 mmol/L (ref 3.5–5.1)
Sodium: 141 mmol/L (ref 135–145)

## 2017-09-18 SURGERY — REMOVAL, DIALYSIS CATHETER
Anesthesia: General | Wound class: Clean

## 2017-09-18 MED ORDER — HYDROCODONE-ACETAMINOPHEN 5-325 MG PO TABS
1.0000 | ORAL_TABLET | Freq: Four times a day (QID) | ORAL | Status: DC | PRN
  Administered 2017-09-18: 1 via ORAL

## 2017-09-18 MED ORDER — HYDROCODONE-ACETAMINOPHEN 5-325 MG PO TABS
1.0000 | ORAL_TABLET | Freq: Four times a day (QID) | ORAL | 0 refills | Status: DC | PRN
Start: 2017-09-18 — End: 2019-07-08

## 2017-09-18 MED ORDER — GLYCOPYRROLATE 0.2 MG/ML IJ SOLN
INTRAMUSCULAR | Status: DC | PRN
  Administered 2017-09-18: 0.2 mg via INTRAVENOUS

## 2017-09-18 MED ORDER — GLYCOPYRROLATE 0.2 MG/ML IJ SOLN
INTRAMUSCULAR | Status: AC
  Filled 2017-09-18: qty 1

## 2017-09-18 MED ORDER — ONDANSETRON HCL 4 MG/2ML IJ SOLN: 4.0000 mg | Freq: Once | INTRAMUSCULAR | Status: DC | PRN

## 2017-09-18 MED ORDER — FENTANYL CITRATE (PF) 100 MCG/2ML IJ SOLN
INTRAMUSCULAR | Status: AC
  Filled 2017-09-18: qty 2

## 2017-09-18 MED ORDER — ONDANSETRON HCL 4 MG/2ML IJ SOLN
INTRAMUSCULAR | Status: DC | PRN
  Administered 2017-09-18: 4 mg via INTRAVENOUS

## 2017-09-18 MED ORDER — CEFAZOLIN SODIUM-DEXTROSE 2-4 GM/100ML-% IV SOLN
INTRAVENOUS | Status: AC
  Filled 2017-09-18: qty 100

## 2017-09-18 MED ORDER — FAMOTIDINE 20 MG PO TABS
20.0000 mg | ORAL_TABLET | Freq: Once | ORAL | Status: AC
  Administered 2017-09-18: 20 mg via ORAL

## 2017-09-18 MED ORDER — FAMOTIDINE 20 MG PO TABS
ORAL_TABLET | ORAL | Status: AC
  Administered 2017-09-18: 20 mg via ORAL
  Filled 2017-09-18: qty 1

## 2017-09-18 MED ORDER — HYDROCODONE-ACETAMINOPHEN 5-325 MG PO TABS
ORAL_TABLET | ORAL | Status: AC
  Administered 2017-09-18: 1 via ORAL
  Filled 2017-09-18: qty 1

## 2017-09-18 MED ORDER — FENTANYL CITRATE (PF) 100 MCG/2ML IJ SOLN: 25.0000 ug | INTRAMUSCULAR | Status: DC | PRN

## 2017-09-18 MED ORDER — SODIUM CHLORIDE 0.9 % IV SOLN
INTRAVENOUS | Status: DC
  Administered 2017-09-18 (×2): via INTRAVENOUS

## 2017-09-18 MED ORDER — PROPOFOL 10 MG/ML IV BOLUS
INTRAVENOUS | Status: DC | PRN
  Administered 2017-09-18: 200 mg via INTRAVENOUS

## 2017-09-18 MED ORDER — PROPOFOL 10 MG/ML IV BOLUS
INTRAVENOUS | Status: AC
  Filled 2017-09-18: qty 20

## 2017-09-18 MED ORDER — LIDOCAINE HCL (CARDIAC) 20 MG/ML IV SOLN
INTRAVENOUS | Status: DC | PRN
  Administered 2017-09-18: 100 mg via INTRAVENOUS

## 2017-09-18 MED ORDER — SODIUM CHLORIDE 0.9 % IV SOLN: INTRAVENOUS | Status: DC

## 2017-09-18 MED ORDER — FENTANYL CITRATE (PF) 100 MCG/2ML IJ SOLN
INTRAMUSCULAR | Status: DC | PRN
  Administered 2017-09-18: 100 ug via INTRAVENOUS

## 2017-09-18 SURGICAL SUPPLY — 21 items
CANISTER SUCT 1200ML W/VALVE (MISCELLANEOUS) ×2 IMPLANT
CHLORAPREP W/TINT 26ML (MISCELLANEOUS) ×2 IMPLANT
DERMABOND ADVANCED (GAUZE/BANDAGES/DRESSINGS) ×1
DERMABOND ADVANCED .7 DNX12 (GAUZE/BANDAGES/DRESSINGS) ×1 IMPLANT
DRAPE LAPAROTOMY TRNSV 106X77 (MISCELLANEOUS) ×2 IMPLANT
ELECT REM PT RETURN 9FT ADLT (ELECTROSURGICAL) ×2
ELECTRODE REM PT RTRN 9FT ADLT (ELECTROSURGICAL) ×1 IMPLANT
GAUZE SPONGE 4X4 12PLY STRL (GAUZE/BANDAGES/DRESSINGS) ×2 IMPLANT
GLOVE BIO SURGEON STRL SZ7 (GLOVE) ×2 IMPLANT
GOWN STRL REUS W/ TWL LRG LVL3 (GOWN DISPOSABLE) ×1 IMPLANT
GOWN STRL REUS W/ TWL XL LVL3 (GOWN DISPOSABLE) ×1 IMPLANT
GOWN STRL REUS W/TWL LRG LVL3 (GOWN DISPOSABLE) ×1
GOWN STRL REUS W/TWL XL LVL3 (GOWN DISPOSABLE) ×1
KIT RM TURNOVER STRD PROC AR (KITS) ×2 IMPLANT
LABEL OR SOLS (LABEL) ×2 IMPLANT
NEEDLE HYPO 25X1 1.5 SAFETY (NEEDLE) ×2 IMPLANT
NS IRRIG 500ML POUR BTL (IV SOLUTION) ×2 IMPLANT
PACK BASIN MINOR ARMC (MISCELLANEOUS) ×2 IMPLANT
SUT MNCRL AB 4-0 PS2 18 (SUTURE) ×2 IMPLANT
SUT VICRYL+ 3-0 36IN CT-1 (SUTURE) ×2 IMPLANT
SYR 10ML LL (SYRINGE) ×2 IMPLANT

## 2017-09-18 NOTE — Anesthesia Post-op Follow-up Note (Signed)
Anesthesia QCDR form completed.        

## 2017-09-18 NOTE — Op Note (Signed)
[facility]   OPERATIVE NOTE  DATE: 09/18/2017  PRE-OPERATIVE DIAGNOSIS: ESRD, switched to HD and not using PD catheter, possible incisional hernia  POST-OPERATIVE DIAGNOSIS: same as above  PROCEDURE: 1.   Removal of peritoneal dialysis catheter 2.   Primary repair of fascial defect (hernia repair)  SURGEON: Leotis Pain, MD  ASSISTANT(S): none  ANESTHESIA: general  ESTIMATED BLOOD LOSS: 5 cc  FINDING(S): 1.  Golf ball sized hernia sac at catheter entrance site with the deep cuff in the sac and the fascial defect being the location of the PD catheter.  SPECIMEN(S):  PD catheter, hernia sac.  Both disposed of and not sent to pathology  INDICATIONS:   Patient is a 61 y.o.male with renal failure.  He initially did PD but has now switched to HD.  This was in part to large bulges after previous surgeries that were concerning for hernias but looked like fluid on a recent CT scan.  The peritoneal catheter needs to be removed.  Risks and benefits are discussed.  DESCRIPTION: After obtaining full informed written consent, the patient was brought back to the operating room and placed supine upon the operating table.  The patient received IV antibiotics prior to induction.  After obtaining adequate anesthesia, the patient was prepped and draped in the standard fashion. The previous incision beside the umbilicus were the deep cuff was located was reopened. On opening this incision, there was the typical appearance of a hernia sac. This was a golf ball sized hernia sac at catheter entrance site with the deep cuff in the sac and the fascial defect being the location of the PD catheter. I dissected out the sac to the fascial defect, and then opened the sac to expose the catheter.  The deep cuff was identified and freed. The catheter was then removed from the peritoneal cavity. The sac was then largely excised and exposed the peritoneal fascial defect that was slightly larger than  the previous PD catheter, and likely allowed some fluid to traverse into the area creating the hernia sac appearance.  A significant portion of the fluid was evacuated.  The fascial defect was then closed with 0 Vicryl figure of eight sutures x 2.  The superficial cuff was then dissected out and freed and the catheter was then transected and removed from the field.  The transverse periumbilical incision was then closed with a 3-0 Vicryl and a 4-0 Monocryl and dermabond was placed.  A dry dressing was placed at the previous catheter exit site.  The patient was then awakened from anesthesia and taken to the recovery room in stable condition.  COMPLICATIONS: None  CONDITION: Stable   Leotis Pain 09/18/2017 2:08 PM

## 2017-09-18 NOTE — OR Nursing (Signed)
Dr. Lucky Cowboy removed the patient's PD catheter. PD catheter was not recorded under lines and drains.
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
## 2017-09-18 NOTE — Anesthesia Postprocedure Evaluation (Signed)
Anesthesia Post Note  Patient: Richard Chen  Procedure(s) Performed: REMOVAL OF A DIALYSIS CATHETER (N/A )  Patient location during evaluation: PACU Anesthesia Type: General Level of consciousness: awake and alert Pain management: pain level controlled Vital Signs Assessment: post-procedure vital signs reviewed and stable Respiratory status: spontaneous breathing and respiratory function stable Cardiovascular status: stable Anesthetic complications: no     Last Vitals:  Vitals:   09/18/17 1037 09/18/17 1409  BP: (!) 176/64   Pulse: (!) 106   Resp: 18   Temp: 36.8 C (!) (P) 36.3 C  SpO2: 97%     Last Pain:  Vitals:   09/18/17 1037  TempSrc: Oral                 Cristyn Crossno K

## 2017-09-18 NOTE — Transfer of Care (Signed)
Immediate Anesthesia Transfer of Care Note  Patient: Richard Chen  Procedure(s) Performed: REMOVAL OF A DIALYSIS CATHETER (N/A )  Patient Location: PACU  Anesthesia Type:General  Level of Consciousness: sedated  Airway & Oxygen Therapy: Patient Spontanous Breathing and Patient connected to face mask oxygen  Post-op Assessment: Report given to RN and Post -op Vital signs reviewed and stable  Post vital signs: Reviewed and stable  Last Vitals:  Vitals:   09/18/17 1037  BP: (!) 176/64  Pulse: (!) 106  Resp: 18  Temp: 36.8 C  SpO2: 97%    Last Pain:  Vitals:   09/18/17 1037  TempSrc: Oral         Complications: No apparent anesthesia complications

## 2017-09-18 NOTE — Anesthesia Procedure Notes (Signed)
Procedure Name: LMA Insertion Date/Time: 09/18/2017 1:00 PM Performed by: Nelda Marseille, CRNA Pre-anesthesia Checklist: Patient identified, Patient being monitored, Timeout performed, Emergency Drugs available and Suction available Patient Re-evaluated:Patient Re-evaluated prior to induction Oxygen Delivery Method: Circle system utilized Preoxygenation: Pre-oxygenation with 100% oxygen Induction Type: IV induction Ventilation: Mask ventilation without difficulty LMA: LMA inserted LMA Size: 4.5 Tube type: Oral Number of attempts: 1 Placement Confirmation: positive ETCO2 and breath sounds checked- equal and bilateral Tube secured with: Tape Dental Injury: Teeth and Oropharynx as per pre-operative assessment

## 2017-09-18 NOTE — Anesthesia Preprocedure Evaluation (Signed)
Anesthesia Evaluation  Patient identified by MRN, date of birth, ID band Patient awake    Reviewed: Allergy & Precautions, NPO status , Patient's Chart, lab work & pertinent test results  History of Anesthesia Complications (+) history of anesthetic complications (pt with bladder retention after anesthesia)  Airway Mallampati: III       Dental  (+) Upper Dentures, Lower Dentures   Pulmonary asthma , neg sleep apnea, neg COPD,           Cardiovascular hypertension, Pt. on medications +CHF  (-) Past MI (-) dysrhythmias (-) Valvular Problems/Murmurs     Neuro/Psych neg Seizures Anxiety Depression    GI/Hepatic Neg liver ROS, neg GERD  ,  Endo/Other  diabetes, Type 2, Oral Hypoglycemic Agents  Renal/GU ESRF and DialysisRenal disease     Musculoskeletal   Abdominal   Peds  Hematology  (+) anemia ,   Anesthesia Other Findings   Reproductive/Obstetrics                             Anesthesia Physical Anesthesia Plan  ASA: IV  Anesthesia Plan: General   Post-op Pain Management:    Induction: Intravenous  PONV Risk Score and Plan: 2 and Dexamethasone and Ondansetron  Airway Management Planned: Oral ETT and LMA  Additional Equipment:   Intra-op Plan:   Post-operative Plan:   Informed Consent: I have reviewed the patients History and Physical, chart, labs and discussed the procedure including the risks, benefits and alternatives for the proposed anesthesia with the patient or authorized representative who has indicated his/her understanding and acceptance.     Plan Discussed with:   Anesthesia Plan Comments:         Anesthesia Quick Evaluation

## 2017-09-18 NOTE — Discharge Instructions (Signed)

## 2017-09-19 ENCOUNTER — Encounter: Payer: Self-pay | Admitting: Vascular Surgery

## 2017-09-19 DIAGNOSIS — Z992 Dependence on renal dialysis: Secondary | ICD-10-CM | POA: Diagnosis not present

## 2017-09-19 DIAGNOSIS — N186 End stage renal disease: Secondary | ICD-10-CM | POA: Diagnosis not present

## 2017-09-20 DIAGNOSIS — Z992 Dependence on renal dialysis: Secondary | ICD-10-CM | POA: Diagnosis not present

## 2017-09-20 DIAGNOSIS — N186 End stage renal disease: Secondary | ICD-10-CM | POA: Diagnosis not present

## 2017-09-21 DIAGNOSIS — N186 End stage renal disease: Secondary | ICD-10-CM | POA: Diagnosis not present

## 2017-09-21 DIAGNOSIS — Z992 Dependence on renal dialysis: Secondary | ICD-10-CM | POA: Diagnosis not present

## 2017-09-24 DIAGNOSIS — Z992 Dependence on renal dialysis: Secondary | ICD-10-CM | POA: Diagnosis not present

## 2017-09-24 DIAGNOSIS — N186 End stage renal disease: Secondary | ICD-10-CM | POA: Diagnosis not present

## 2017-09-26 DIAGNOSIS — Z992 Dependence on renal dialysis: Secondary | ICD-10-CM | POA: Diagnosis not present

## 2017-09-26 DIAGNOSIS — N186 End stage renal disease: Secondary | ICD-10-CM | POA: Diagnosis not present

## 2017-09-28 DIAGNOSIS — Z992 Dependence on renal dialysis: Secondary | ICD-10-CM | POA: Diagnosis not present

## 2017-09-28 DIAGNOSIS — N186 End stage renal disease: Secondary | ICD-10-CM | POA: Diagnosis not present

## 2017-10-01 DIAGNOSIS — N186 End stage renal disease: Secondary | ICD-10-CM | POA: Diagnosis not present

## 2017-10-01 DIAGNOSIS — Z992 Dependence on renal dialysis: Secondary | ICD-10-CM | POA: Diagnosis not present

## 2017-10-03 DIAGNOSIS — Z992 Dependence on renal dialysis: Secondary | ICD-10-CM | POA: Diagnosis not present

## 2017-10-03 DIAGNOSIS — N186 End stage renal disease: Secondary | ICD-10-CM | POA: Diagnosis not present

## 2017-10-05 DIAGNOSIS — Z992 Dependence on renal dialysis: Secondary | ICD-10-CM | POA: Diagnosis not present

## 2017-10-05 DIAGNOSIS — N186 End stage renal disease: Secondary | ICD-10-CM | POA: Diagnosis not present

## 2017-10-08 DIAGNOSIS — Z992 Dependence on renal dialysis: Secondary | ICD-10-CM | POA: Diagnosis not present

## 2017-10-08 DIAGNOSIS — N186 End stage renal disease: Secondary | ICD-10-CM | POA: Diagnosis not present

## 2017-10-10 DIAGNOSIS — N186 End stage renal disease: Secondary | ICD-10-CM | POA: Diagnosis not present

## 2017-10-10 DIAGNOSIS — Z992 Dependence on renal dialysis: Secondary | ICD-10-CM | POA: Diagnosis not present

## 2017-10-12 DIAGNOSIS — N186 End stage renal disease: Secondary | ICD-10-CM | POA: Diagnosis not present

## 2017-10-12 DIAGNOSIS — Z992 Dependence on renal dialysis: Secondary | ICD-10-CM | POA: Diagnosis not present

## 2017-10-14 DIAGNOSIS — Z992 Dependence on renal dialysis: Secondary | ICD-10-CM | POA: Diagnosis not present

## 2017-10-14 DIAGNOSIS — N186 End stage renal disease: Secondary | ICD-10-CM | POA: Diagnosis not present

## 2017-10-16 ENCOUNTER — Encounter (INDEPENDENT_AMBULATORY_CARE_PROVIDER_SITE_OTHER): Payer: Self-pay | Admitting: Vascular Surgery

## 2017-10-16 ENCOUNTER — Ambulatory Visit (INDEPENDENT_AMBULATORY_CARE_PROVIDER_SITE_OTHER): Payer: Medicare HMO | Admitting: Vascular Surgery

## 2017-10-16 VITALS — BP 194/73 | HR 54 | Resp 17 | Wt 260.0 lb

## 2017-10-16 DIAGNOSIS — Z992 Dependence on renal dialysis: Secondary | ICD-10-CM

## 2017-10-16 DIAGNOSIS — T8090XS Unspecified complication following infusion and therapeutic injection, sequela: Secondary | ICD-10-CM

## 2017-10-16 DIAGNOSIS — N186 End stage renal disease: Secondary | ICD-10-CM

## 2017-10-16 DIAGNOSIS — E1121 Type 2 diabetes mellitus with diabetic nephropathy: Secondary | ICD-10-CM
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
## 2017-10-17 DIAGNOSIS — Z992 Dependence on renal dialysis: Secondary | ICD-10-CM | POA: Diagnosis not present

## 2017-10-17 DIAGNOSIS — N186 End stage renal disease: Secondary | ICD-10-CM | POA: Diagnosis not present

## 2017-10-19 DIAGNOSIS — Z992 Dependence on renal dialysis: Secondary | ICD-10-CM | POA: Diagnosis not present

## 2017-10-19 DIAGNOSIS — N186 End stage renal disease: Secondary | ICD-10-CM | POA: Diagnosis not present

## 2017-10-21 DIAGNOSIS — N186 End stage renal disease: Secondary | ICD-10-CM | POA: Diagnosis not present

## 2017-10-21 DIAGNOSIS — Z992 Dependence on renal dialysis: Secondary | ICD-10-CM | POA: Diagnosis not present

## 2017-10-22 DIAGNOSIS — Z992 Dependence on renal dialysis: Secondary | ICD-10-CM | POA: Diagnosis not present

## 2017-10-22 DIAGNOSIS — N186 End stage renal disease: Secondary | ICD-10-CM | POA: Diagnosis not present

## 2017-10-24 DIAGNOSIS — Z992 Dependence on renal dialysis: Secondary | ICD-10-CM | POA: Diagnosis not present

## 2017-10-24 DIAGNOSIS — Z7682 Awaiting organ transplant status: Secondary | ICD-10-CM | POA: Diagnosis not present

## 2017-10-24 DIAGNOSIS — N186 End stage renal disease: Secondary | ICD-10-CM | POA: Diagnosis not present

## 2017-10-26 DIAGNOSIS — N186 End stage renal disease: Secondary | ICD-10-CM | POA: Diagnosis not present

## 2017-10-26 DIAGNOSIS — Z992 Dependence on renal dialysis: Secondary | ICD-10-CM | POA: Diagnosis not present

## 2017-10-29 DIAGNOSIS — Z992 Dependence on renal dialysis: Secondary | ICD-10-CM | POA: Diagnosis not present

## 2017-10-29 DIAGNOSIS — N186 End stage renal disease: Secondary | ICD-10-CM | POA: Diagnosis not present

## 2017-10-29 DIAGNOSIS — Z7682 Awaiting organ transplant status: Secondary | ICD-10-CM | POA: Diagnosis not present

## 2017-10-31 DIAGNOSIS — N186 End stage renal disease: Secondary | ICD-10-CM | POA: Diagnosis not present

## 2017-10-31 DIAGNOSIS — Z992 Dependence on renal dialysis: Secondary | ICD-10-CM | POA: Diagnosis not present

## 2017-11-02 DIAGNOSIS — N186 End stage renal disease: Secondary | ICD-10-CM | POA: Diagnosis not present

## 2017-11-02 DIAGNOSIS — Z992 Dependence on renal dialysis: Secondary | ICD-10-CM | POA: Diagnosis not present

## 2017-11-05 DIAGNOSIS — Z992 Dependence on renal dialysis: Secondary | ICD-10-CM | POA: Diagnosis not present

## 2017-11-05 DIAGNOSIS — Z794 Long term (current) use of insulin: Secondary | ICD-10-CM | POA: Diagnosis not present

## 2017-11-05 DIAGNOSIS — E119 Type 2 diabetes mellitus without complications: Secondary | ICD-10-CM | POA: Diagnosis not present

## 2017-11-05 DIAGNOSIS — N186 End stage renal disease: Secondary | ICD-10-CM | POA: Diagnosis not present

## 2017-11-07 DIAGNOSIS — N186 End stage renal disease: Secondary | ICD-10-CM | POA: Diagnosis not present

## 2017-11-07 DIAGNOSIS — Z992 Dependence on renal dialysis: Secondary | ICD-10-CM | POA: Diagnosis not present

## 2017-11-09 DIAGNOSIS — Z992 Dependence on renal dialysis: Secondary | ICD-10-CM | POA: Diagnosis not present

## 2017-11-09 DIAGNOSIS — N186 End stage renal disease: Secondary | ICD-10-CM | POA: Diagnosis not present

## 2017-11-12 DIAGNOSIS — Z992 Dependence on renal dialysis: Secondary | ICD-10-CM | POA: Diagnosis not present

## 2017-11-12 DIAGNOSIS — N186 End stage renal disease: Secondary | ICD-10-CM | POA: Diagnosis not present

## 2017-11-14 DIAGNOSIS — N186 End stage renal disease: Secondary | ICD-10-CM | POA: Diagnosis not present

## 2017-11-14 DIAGNOSIS — Z992 Dependence on renal dialysis: Secondary | ICD-10-CM | POA: Diagnosis not present

## 2017-11-16 DIAGNOSIS — N186 End stage renal disease: Secondary | ICD-10-CM | POA: Diagnosis not present

## 2017-11-16 DIAGNOSIS — Z992 Dependence on renal dialysis: Secondary | ICD-10-CM | POA: Diagnosis not present

## 2017-11-18 DIAGNOSIS — Z992 Dependence on renal dialysis: Secondary | ICD-10-CM | POA: Diagnosis not present

## 2017-11-18 DIAGNOSIS — N186 End stage renal disease: Secondary | ICD-10-CM | POA: Diagnosis not present

## 2017-11-19 ENCOUNTER — Encounter (INDEPENDENT_AMBULATORY_CARE_PROVIDER_SITE_OTHER): Payer: Self-pay | Admitting: Vascular Surgery

## 2017-11-19 ENCOUNTER — Ambulatory Visit (INDEPENDENT_AMBULATORY_CARE_PROVIDER_SITE_OTHER): Payer: Medicare HMO

## 2017-11-19 ENCOUNTER — Ambulatory Visit (INDEPENDENT_AMBULATORY_CARE_PROVIDER_SITE_OTHER): Payer: Medicare HMO | Admitting: Vascular Surgery

## 2017-11-19 VITALS — BP 163/66 | HR 56 | Resp 16 | Wt 258.8 lb

## 2017-11-19 DIAGNOSIS — E1121 Type 2 diabetes mellitus with diabetic nephropathy: Secondary | ICD-10-CM

## 2017-11-19 DIAGNOSIS — Z992 Dependence on renal dialysis: Secondary | ICD-10-CM

## 2017-11-19 DIAGNOSIS — N186 End stage renal disease: Secondary | ICD-10-CM

## 2017-11-19 DIAGNOSIS — E785 Hyperlipidemia, unspecified: Secondary | ICD-10-CM

## 2017-11-19 DIAGNOSIS — K432 Incisional hernia without obstruction or gangrene: Secondary | ICD-10-CM

## 2017-11-19 DIAGNOSIS — I10 Essential (primary) hypertension: Secondary | ICD-10-CM

## 2017-11-19 DIAGNOSIS — R1900 Intra-abdominal and pelvic swelling, mass and lump, unspecified site: Secondary | ICD-10-CM

## 2017-11-19 NOTE — Patient Instructions (Signed)
Vascular Access for Hemodialysis A vascular access is a connection between two blood vessels that allows blood to be easily removed from the body and returned to the body during hemodialysis. Hemodialysis is a procedure in which a machine outside of the body filters the blood. There are three types of vascular accesses:  Arteriovenous fistula. This is a connection between an artery and a vein (usually in the arm) that is made by sewing them together. Blood in the artery flows directly into the vein, causing it to get larger over time. This makes it easier for the vein to be used for hemodialysis. An arteriovenous fistula takes 1-6 months to develop after surgery.  Arteriovenous graft. This is a connection between an artery and a vein in the arm that is made with a tube. An arteriovenous graft can be used within 2-3 weeks of surgery.  Venous catheter. This is a thin, flexible tube that is placed in a large vein (usually in the neck, chest, or groin). A venous catheter for hemodialysis contains two tubes that come out of the skin. A venous catheter can be used right away. It is usually used as a temporary access if you need hemodialysis before a fistula or graft has developed. It may also be used as a permanent access if a fistula or graft cannot be created.  Which type of access is best for me? The type of access that is best for you depends on the size and strength of your veins. A fistula is usually the preferred type of access. It can last several years and is less likely than the other types of accesses to become infected or to cause blood clots within a blood vessel (thrombosis). However, a fistula is not an option for everyone. If your veins are not the right size, a graft may be used instead. Grafts require you to have strong veins. If your veins are not strong enough for a graft, a catheter may be used. Catheters are more likely than fistulas and grafts to become infected or to have  thrombosis. Sometimes, only one type of access is an option. Your health care provider will help you determine which type of access is best for you. How is a vascular access used? The way the access is used depends on the type of access:  If the access is a fistula or graft, two needles are inserted through the skin into the access before each hemodialysis session. Blood leaves the body through one of the needles and travels through a tube to the hemodialysis machine (dialyzer). It then flows through another tube and returns to the body through the second needle.  If the access is a catheter, one tube is connected directly to the tube that leads to the dialyzer and the other is connected to a tube that leads away from the dialyzer. Blood leaves the body through one tube and returns to the body through the other.  What kind of problems can occur with vascular accesses?  Blood clots within a blood vessel (thrombosis). Thrombosis can lead to a narrowing of a blood vessel or tube (stenosis). If thrombosis occurs frequently, another access site may be created as a backup.  Infection. These problems are most likely to occur with a venous catheter and least likely to occur with an arteriovenous fistula. How do I care for my vascular access? Wear a medical alert bracelet. This tells health care providers that you are a dialysis patient in the case of an emergency and   allows them to care for your veins appropriately. If you have a graft or fistula:  A "bruit" is a noise that is heard with a stethoscope and a "thrill" is a vibration felt over the graft or fistula. The presence of the bruit and thrill indicates that the access is working. You will be taught to feel for the thrill each day. If this is not felt, the access may be clotted. Call your health care provider.  You may use the arm where your vascular access is located freely after the site heals. Keep the following in mind: ? Avoid pressure on the  arm. ? Avoid lifting heavy objects with the arm. ? Avoid sleeping on the arm. ? Avoid wearing tight-sleeved shirts or jewelry around the graft or fistula.  Do not allow blood pressure monitoring or needle punctures on the side where the graft or fistula is located.  With permission from your health care provider, you may do exercises to help with blood flow through a fistula. These exercises involve squeezing a rubber ball or other soft objects as instructed.  Contact a health care provider if:  Chills develop.  You have an oral temperature above 102 F (38.9 C).  Swelling around the graft or fistula gets worse.  New pain develops.  Pus or other fluid (drainage) is seen at the vascular access site.  Skin redness or red streaking is seen on the skin around, above, or below the vascular access. Get help right away if:  Pain, numbness, or an unusual pale skin color develops in the hand on the side of your fistula.  Dizziness or weakness develops that you have not had before.  The vascular access has bleeding that cannot be easily controlled. This information is not intended to replace advice given to you by your health care provider. Make sure you discuss any questions you have with your health care provider. Document Released: 12/29/2002 Document Revised: 03/15/2016 Document Reviewed: 02/24/2013 [employer] Interactive Patient Education  2017 [employer]  
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
## 2017-11-19 NOTE — Assessment & Plan Note (Signed)
lipid control important in reducing the progression of atherosclerotic disease. Continue statin therapy  

## 2017-11-19 NOTE — Assessment & Plan Note (Signed)
His access is working well, but there is some stenosis at the venous anastomosis.  I have offered him intervention at this time versus a short interval follow-up since the access is working well.  There is some risk of thrombosis of the access with a short interval follow-up, but he would prefer this option which is reasonable.

## 2017-11-19 NOTE — Assessment & Plan Note (Signed)
blood pressure control important in reducing the progression of atherosclerotic disease. On appropriate oral medications.  

## 2017-11-19 NOTE — Assessment & Plan Note (Signed)
blood glucose control important in reducing the progression of atherosclerotic disease. Also, involved in wound healing. On appropriate medications.  

## 2017-11-19 NOTE — Assessment & Plan Note (Signed)
As for the cyst/fluid collection in his abdomen, if this bothers him I will be happy to remove it and this could be done as an outpatient procedure.  At current, it is not bothering him so he would prefer to leave it.

## 2017-11-20 DIAGNOSIS — Z992 Dependence on renal dialysis: Secondary | ICD-10-CM | POA: Diagnosis not present

## 2017-11-20 DIAGNOSIS — N186 End stage renal disease: Secondary | ICD-10-CM | POA: Diagnosis not present

## 2017-11-21 DIAGNOSIS — N186 End stage renal disease: Secondary | ICD-10-CM | POA: Diagnosis not present

## 2017-11-21 DIAGNOSIS — Z992 Dependence on renal dialysis: Secondary | ICD-10-CM | POA: Diagnosis not present

## 2017-11-22 DIAGNOSIS — Z992 Dependence on renal dialysis: Secondary | ICD-10-CM | POA: Diagnosis not present

## 2017-11-22 DIAGNOSIS — N186 End stage renal disease: Secondary | ICD-10-CM | POA: Diagnosis not present

## 2017-11-25 DIAGNOSIS — Z992 Dependence on renal dialysis: Secondary | ICD-10-CM | POA: Diagnosis not present

## 2017-11-25 DIAGNOSIS — N186 End stage renal disease: Secondary | ICD-10-CM | POA: Diagnosis not present

## 2017-11-27 DIAGNOSIS — Z992 Dependence on renal dialysis: Secondary | ICD-10-CM | POA: Diagnosis not present

## 2017-11-27 DIAGNOSIS — N186 End stage renal disease: Secondary | ICD-10-CM | POA: Diagnosis not present

## 2017-11-29 DIAGNOSIS — N186 End stage renal disease: Secondary | ICD-10-CM | POA: Diagnosis not present

## 2017-11-29 DIAGNOSIS — Z992 Dependence on renal dialysis: Secondary | ICD-10-CM | POA: Diagnosis not present

## 2017-12-02 DIAGNOSIS — Z992 Dependence on renal dialysis: Secondary | ICD-10-CM | POA: Diagnosis not present

## 2017-12-02 DIAGNOSIS — N186 End stage renal disease: Secondary | ICD-10-CM | POA: Diagnosis not present

## 2017-12-04 DIAGNOSIS — Z992 Dependence on renal dialysis: Secondary | ICD-10-CM | POA: Diagnosis not present

## 2017-12-04 DIAGNOSIS — N186 End stage renal disease: Secondary | ICD-10-CM | POA: Diagnosis not present

## 2017-12-04 DIAGNOSIS — H5319 Other subjective visual disturbances: Secondary | ICD-10-CM | POA: Diagnosis not present

## 2017-12-04 DIAGNOSIS — E113293 Type 2 diabetes mellitus with mild nonproliferative diabetic retinopathy without macular edema, bilateral: Secondary | ICD-10-CM | POA: Diagnosis not present

## 2017-12-04 LAB — HM DIABETES EYE EXAM

## 2017-12-06 DIAGNOSIS — Z992 Dependence on renal dialysis: Secondary | ICD-10-CM | POA: Diagnosis not present

## 2017-12-06 DIAGNOSIS — N186 End stage renal disease: Secondary | ICD-10-CM | POA: Diagnosis not present

## 2017-12-09 DIAGNOSIS — N186 End stage renal disease: Secondary | ICD-10-CM | POA: Diagnosis not present

## 2017-12-09 DIAGNOSIS — Z992 Dependence on renal dialysis: Secondary | ICD-10-CM | POA: Diagnosis not present

## 2017-12-11 DIAGNOSIS — N186 End stage renal disease: Secondary | ICD-10-CM | POA: Diagnosis not present

## 2017-12-11 DIAGNOSIS — Z992 Dependence on renal dialysis: Secondary | ICD-10-CM | POA: Diagnosis not present

## 2017-12-13 DIAGNOSIS — Z992 Dependence on renal dialysis: Secondary | ICD-10-CM | POA: Diagnosis not present

## 2017-12-13 DIAGNOSIS — N186 End stage renal disease: Secondary | ICD-10-CM | POA: Diagnosis not present

## 2017-12-16 DIAGNOSIS — N186 End stage renal disease: Secondary | ICD-10-CM | POA: Diagnosis not present

## 2017-12-16 DIAGNOSIS — Z992 Dependence on renal dialysis: Secondary | ICD-10-CM | POA: Diagnosis not present

## 2017-12-18 DIAGNOSIS — Z992 Dependence on renal dialysis: Secondary | ICD-10-CM | POA: Diagnosis not present

## 2017-12-18 DIAGNOSIS — N186 End stage renal disease: Secondary | ICD-10-CM | POA: Diagnosis not present

## 2017-12-19 DIAGNOSIS — Z992 Dependence on renal dialysis: Secondary | ICD-10-CM | POA: Diagnosis not present

## 2017-12-19 DIAGNOSIS — N186 End stage renal disease: Secondary | ICD-10-CM | POA: Diagnosis not present

## 2017-12-20 DIAGNOSIS — Z992 Dependence on renal dialysis: Secondary | ICD-10-CM | POA: Diagnosis not present

## 2017-12-20 DIAGNOSIS — N186 End stage renal disease: Secondary | ICD-10-CM | POA: Diagnosis not present

## 2017-12-23 DIAGNOSIS — N186 End stage renal disease: Secondary | ICD-10-CM | POA: Diagnosis not present

## 2017-12-23 DIAGNOSIS — Z992 Dependence on renal dialysis: Secondary | ICD-10-CM | POA: Diagnosis not present

## 2017-12-25 DIAGNOSIS — Z992 Dependence on renal dialysis: Secondary | ICD-10-CM | POA: Diagnosis not present

## 2017-12-25 DIAGNOSIS — N186 End stage renal disease: Secondary | ICD-10-CM | POA: Diagnosis not present

## 2017-12-27 DIAGNOSIS — Z992 Dependence on renal dialysis: Secondary | ICD-10-CM | POA: Diagnosis not present

## 2017-12-27 DIAGNOSIS — N186 End stage renal disease: Secondary | ICD-10-CM | POA: Diagnosis not present

## 2017-12-30 DIAGNOSIS — Z992 Dependence on renal dialysis: Secondary | ICD-10-CM | POA: Diagnosis not present

## 2017-12-30 DIAGNOSIS — N186 End stage renal disease: Secondary | ICD-10-CM | POA: Diagnosis not present

## 2018-01-01 DIAGNOSIS — Z992 Dependence on renal dialysis: Secondary | ICD-10-CM | POA: Diagnosis not present

## 2018-01-01 DIAGNOSIS — N186 End stage renal disease: Secondary | ICD-10-CM | POA: Diagnosis not present

## 2018-01-03 DIAGNOSIS — Z992 Dependence on renal dialysis: Secondary | ICD-10-CM | POA: Diagnosis not present

## 2018-01-03 DIAGNOSIS — N186 End stage renal disease: Secondary | ICD-10-CM | POA: Diagnosis not present

## 2018-01-06 DIAGNOSIS — N186 End stage renal disease: Secondary | ICD-10-CM | POA: Diagnosis not present

## 2018-01-06 DIAGNOSIS — Z992 Dependence on renal dialysis: Secondary | ICD-10-CM | POA: Diagnosis not present

## 2018-01-08 DIAGNOSIS — N186 End stage renal disease: Secondary | ICD-10-CM | POA: Diagnosis not present

## 2018-01-08 DIAGNOSIS — Z992 Dependence on renal dialysis: Secondary | ICD-10-CM | POA: Diagnosis not present

## 2018-01-10 DIAGNOSIS — Z992 Dependence on renal dialysis: Secondary | ICD-10-CM | POA: Diagnosis not present

## 2018-01-10 DIAGNOSIS — N186 End stage renal disease: Secondary | ICD-10-CM | POA: Diagnosis not present

## 2018-01-13 DIAGNOSIS — N186 End stage renal disease: Secondary | ICD-10-CM | POA: Diagnosis not present

## 2018-01-13 DIAGNOSIS — Z992 Dependence on renal dialysis: Secondary | ICD-10-CM | POA: Diagnosis not present

## 2018-01-15 DIAGNOSIS — Z992 Dependence on renal dialysis: Secondary | ICD-10-CM | POA: Diagnosis not present

## 2018-01-15 DIAGNOSIS — N186 End stage renal disease: Secondary | ICD-10-CM | POA: Diagnosis not present

## 2018-01-17 DIAGNOSIS — Z992 Dependence on renal dialysis: Secondary | ICD-10-CM | POA: Diagnosis not present

## 2018-01-17 DIAGNOSIS — N186 End stage renal disease: Secondary | ICD-10-CM | POA: Diagnosis not present

## 2018-01-19 DIAGNOSIS — N186 End stage renal disease: Secondary | ICD-10-CM | POA: Diagnosis not present

## 2018-01-19 DIAGNOSIS — Z992 Dependence on renal dialysis: Secondary | ICD-10-CM | POA: Diagnosis not present

## 2018-01-20 DIAGNOSIS — Z992 Dependence on renal dialysis: Secondary | ICD-10-CM | POA: Diagnosis not present

## 2018-01-20 DIAGNOSIS — Z7682 Awaiting organ transplant status: Secondary | ICD-10-CM | POA: Diagnosis not present

## 2018-01-20 DIAGNOSIS — N186 End stage renal disease: Secondary | ICD-10-CM | POA: Diagnosis not present

## 2018-01-21 ENCOUNTER — Ambulatory Visit (INDEPENDENT_AMBULATORY_CARE_PROVIDER_SITE_OTHER): Payer: Medicare HMO

## 2018-01-21 ENCOUNTER — Encounter (INDEPENDENT_AMBULATORY_CARE_PROVIDER_SITE_OTHER): Payer: Self-pay | Admitting: Vascular Surgery

## 2018-01-21 ENCOUNTER — Ambulatory Visit (INDEPENDENT_AMBULATORY_CARE_PROVIDER_SITE_OTHER): Payer: Medicare HMO | Admitting: Vascular Surgery

## 2018-01-21 VITALS — BP 146/63 | HR 60 | Resp 16 | Ht 73.0 in | Wt 263.6 lb

## 2018-01-21 DIAGNOSIS — N186 End stage renal disease: Secondary | ICD-10-CM

## 2018-01-21 DIAGNOSIS — I10 Essential (primary) hypertension: Secondary | ICD-10-CM | POA: Diagnosis not present

## 2018-01-21 DIAGNOSIS — E785 Hyperlipidemia, unspecified: Secondary | ICD-10-CM

## 2018-01-21 DIAGNOSIS — Z992 Dependence on renal dialysis: Secondary | ICD-10-CM

## 2018-01-21 DIAGNOSIS — E1121 Type 2 diabetes mellitus with diabetic nephropathy: Secondary | ICD-10-CM

## 2018-01-21 NOTE — Patient Instructions (Signed)
Vascular Access for Hemodialysis A vascular access is a connection between two blood vessels that allows blood to be easily removed from the body and returned to the body during hemodialysis. Hemodialysis is a procedure in which a machine outside of the body filters the blood. There are three types of vascular accesses:  Arteriovenous fistula. This is a connection between an artery and a vein (usually in the arm) that is made by sewing them together. Blood in the artery flows directly into the vein, causing it to get larger over time. This makes it easier for the vein to be used for hemodialysis. An arteriovenous fistula takes 1-6 months to develop after surgery.  Arteriovenous graft. This is a connection between an artery and a vein in the arm that is made with a tube. An arteriovenous graft can be used within 2-3 weeks of surgery.  Venous catheter. This is a thin, flexible tube that is placed in a large vein (usually in the neck, chest, or groin). A venous catheter for hemodialysis contains two tubes that come out of the skin. A venous catheter can be used right away. It is usually used as a temporary access if you need hemodialysis before a fistula or graft has developed. It may also be used as a permanent access if a fistula or graft cannot be created.  Which type of access is best for me? The type of access that is best for you depends on the size and strength of your veins. A fistula is usually the preferred type of access. It can last several years and is less likely than the other types of accesses to become infected or to cause blood clots within a blood vessel (thrombosis). However, a fistula is not an option for everyone. If your veins are not the right size, a graft may be used instead. Grafts require you to have strong veins. If your veins are not strong enough for a graft, a catheter may be used. Catheters are more likely than fistulas and grafts to become infected or to have  thrombosis. Sometimes, only one type of access is an option. Your health care provider will help you determine which type of access is best for you. How is a vascular access used? The way the access is used depends on the type of access:  If the access is a fistula or graft, two needles are inserted through the skin into the access before each hemodialysis session. Blood leaves the body through one of the needles and travels through a tube to the hemodialysis machine (dialyzer). It then flows through another tube and returns to the body through the second needle.  If the access is a catheter, one tube is connected directly to the tube that leads to the dialyzer and the other is connected to a tube that leads away from the dialyzer. Blood leaves the body through one tube and returns to the body through the other.  What kind of problems can occur with vascular accesses?  Blood clots within a blood vessel (thrombosis). Thrombosis can lead to a narrowing of a blood vessel or tube (stenosis). If thrombosis occurs frequently, another access site may be created as a backup.  Infection. These problems are most likely to occur with a venous catheter and least likely to occur with an arteriovenous fistula. How do I care for my vascular access? Wear a medical alert bracelet. This tells health care providers that you are a dialysis patient in the case of an emergency and   allows them to care for your veins appropriately. If you have a graft or fistula:  A "bruit" is a noise that is heard with a stethoscope and a "thrill" is a vibration felt over the graft or fistula. The presence of the bruit and thrill indicates that the access is working. You will be taught to feel for the thrill each day. If this is not felt, the access may be clotted. Call your health care provider.  You may use the arm where your vascular access is located freely after the site heals. Keep the following in mind: ? Avoid pressure on the  arm. ? Avoid lifting heavy objects with the arm. ? Avoid sleeping on the arm. ? Avoid wearing tight-sleeved shirts or jewelry around the graft or fistula.  Do not allow blood pressure monitoring or needle punctures on the side where the graft or fistula is located.  With permission from your health care provider, you may do exercises to help with blood flow through a fistula. These exercises involve squeezing a rubber ball or other soft objects as instructed.  Contact a health care provider if:  Chills develop.  You have an oral temperature above 102 F (38.9 C).  Swelling around the graft or fistula gets worse.  New pain develops.  Pus or other fluid (drainage) is seen at the vascular access site.  Skin redness or red streaking is seen on the skin around, above, or below the vascular access. Get help right away if:  Pain, numbness, or an unusual pale skin color develops in the hand on the side of your fistula.  Dizziness or weakness develops that you have not had before.  The vascular access has bleeding that cannot be easily controlled. This information is not intended to replace advice given to you by your health care provider. Make sure you discuss any questions you have with your health care provider. Document Released: 12/29/2002 Document Revised: 03/15/2016 Document Reviewed: 02/24/2013 [employer] Interactive Patient Education  2017 [employer]  

## 2018-01-21 NOTE — Assessment & Plan Note (Signed)
Duplex today shows no hemodynamically significant stenosis within the right brachial artery to axillary vein AV graft. Continue to use. Recheck in 6 months.
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
## 2018-01-22 DIAGNOSIS — Z992 Dependence on renal dialysis: Secondary | ICD-10-CM | POA: Diagnosis not present

## 2018-01-22 DIAGNOSIS — N186 End stage renal disease: Secondary | ICD-10-CM | POA: Diagnosis not present

## 2018-01-24 DIAGNOSIS — Z992 Dependence on renal dialysis: Secondary | ICD-10-CM | POA: Diagnosis not present

## 2018-01-24 DIAGNOSIS — N186 End stage renal disease: Secondary | ICD-10-CM | POA: Diagnosis not present

## 2018-01-27 DIAGNOSIS — E119 Type 2 diabetes mellitus without complications: Secondary | ICD-10-CM | POA: Diagnosis not present

## 2018-01-27 DIAGNOSIS — N186 End stage renal disease: Secondary | ICD-10-CM | POA: Diagnosis not present

## 2018-01-27 DIAGNOSIS — Z794 Long term (current) use of insulin: Secondary | ICD-10-CM | POA: Diagnosis not present

## 2018-01-27 DIAGNOSIS — Z992 Dependence on renal dialysis: Secondary | ICD-10-CM | POA: Diagnosis not present

## 2018-01-29 DIAGNOSIS — Z992 Dependence on renal dialysis: Secondary | ICD-10-CM | POA: Diagnosis not present

## 2018-01-29 DIAGNOSIS — N186 End stage renal disease: Secondary | ICD-10-CM | POA: Diagnosis not present

## 2018-01-31 DIAGNOSIS — N186 End stage renal disease: Secondary | ICD-10-CM | POA: Diagnosis not present

## 2018-01-31 DIAGNOSIS — Z992 Dependence on renal dialysis: Secondary | ICD-10-CM | POA: Diagnosis not present

## 2018-02-03 DIAGNOSIS — Z992 Dependence on renal dialysis: Secondary | ICD-10-CM | POA: Diagnosis not present

## 2018-02-03 DIAGNOSIS — N186 End stage renal disease: Secondary | ICD-10-CM | POA: Diagnosis not present

## 2018-02-05 DIAGNOSIS — Z992 Dependence on renal dialysis: Secondary | ICD-10-CM | POA: Diagnosis not present

## 2018-02-05 DIAGNOSIS — N186 End stage renal disease: Secondary | ICD-10-CM | POA: Diagnosis not present

## 2018-02-07 DIAGNOSIS — Z992 Dependence on renal dialysis: Secondary | ICD-10-CM | POA: Diagnosis not present

## 2018-02-07 DIAGNOSIS — N186 End stage renal disease: Secondary | ICD-10-CM | POA: Diagnosis not present

## 2018-02-10 DIAGNOSIS — Z992 Dependence on renal dialysis: Secondary | ICD-10-CM | POA: Diagnosis not present

## 2018-02-10 DIAGNOSIS — N186 End stage renal disease: Secondary | ICD-10-CM | POA: Diagnosis not present

## 2018-02-12 DIAGNOSIS — R001 Bradycardia, unspecified: Secondary | ICD-10-CM | POA: Diagnosis not present

## 2018-02-12 DIAGNOSIS — Z992 Dependence on renal dialysis: Secondary | ICD-10-CM | POA: Diagnosis not present

## 2018-02-12 DIAGNOSIS — R0602 Shortness of breath: Secondary | ICD-10-CM | POA: Diagnosis not present

## 2018-02-12 DIAGNOSIS — N186 End stage renal disease: Secondary | ICD-10-CM | POA: Diagnosis not present

## 2018-02-12 DIAGNOSIS — E7800 Pure hypercholesterolemia, unspecified: Secondary | ICD-10-CM | POA: Diagnosis not present

## 2018-02-12 DIAGNOSIS — I10 Essential (primary) hypertension: Secondary | ICD-10-CM | POA: Diagnosis not present

## 2018-02-13 DIAGNOSIS — N186 End stage renal disease: Secondary | ICD-10-CM | POA: Diagnosis not present

## 2018-02-13 DIAGNOSIS — Z992 Dependence on renal dialysis: Secondary | ICD-10-CM | POA: Diagnosis not present

## 2018-02-14 DIAGNOSIS — N186 End stage renal disease: Secondary | ICD-10-CM | POA: Diagnosis not present

## 2018-02-14 DIAGNOSIS — Z992 Dependence on renal dialysis: Secondary | ICD-10-CM | POA: Diagnosis not present

## 2018-02-17 DIAGNOSIS — Z992 Dependence on renal dialysis: Secondary | ICD-10-CM | POA: Diagnosis not present

## 2018-02-17 DIAGNOSIS — N186 End stage renal disease: Secondary | ICD-10-CM | POA: Diagnosis not present

## 2018-02-18 DIAGNOSIS — N186 End stage renal disease: Secondary | ICD-10-CM | POA: Diagnosis not present

## 2018-02-18 DIAGNOSIS — Z992 Dependence on renal dialysis: Secondary | ICD-10-CM | POA: Diagnosis not present

## 2018-02-19 DIAGNOSIS — N186 End stage renal disease: Secondary | ICD-10-CM | POA: Diagnosis not present

## 2018-02-19 DIAGNOSIS — Z992 Dependence on renal dialysis: Secondary | ICD-10-CM | POA: Diagnosis not present

## 2018-02-21 DIAGNOSIS — N186 End stage renal disease: Secondary | ICD-10-CM | POA: Diagnosis not present

## 2018-02-21 DIAGNOSIS — Z992 Dependence on renal dialysis: Secondary | ICD-10-CM | POA: Diagnosis not present

## 2018-02-24 DIAGNOSIS — N186 End stage renal disease: Secondary | ICD-10-CM | POA: Diagnosis not present

## 2018-02-24 DIAGNOSIS — Z992 Dependence on renal dialysis: Secondary | ICD-10-CM | POA: Diagnosis not present

## 2018-02-26 DIAGNOSIS — N186 End stage renal disease: Secondary | ICD-10-CM | POA: Diagnosis not present

## 2018-02-26 DIAGNOSIS — Z992 Dependence on renal dialysis: Secondary | ICD-10-CM | POA: Diagnosis not present

## 2018-02-28 DIAGNOSIS — Z992 Dependence on renal dialysis: Secondary | ICD-10-CM | POA: Diagnosis not present

## 2018-02-28 DIAGNOSIS — N186 End stage renal disease: Secondary | ICD-10-CM | POA: Diagnosis not present

## 2018-03-03 DIAGNOSIS — N186 End stage renal disease: Secondary | ICD-10-CM | POA: Diagnosis not present

## 2018-03-03 DIAGNOSIS — Z992 Dependence on renal dialysis: Secondary | ICD-10-CM | POA: Diagnosis not present

## 2018-03-05 DIAGNOSIS — N186 End stage renal disease: Secondary | ICD-10-CM | POA: Diagnosis not present

## 2018-03-05 DIAGNOSIS — Z992 Dependence on renal dialysis: Secondary | ICD-10-CM | POA: Diagnosis not present

## 2018-03-07 DIAGNOSIS — Z992 Dependence on renal dialysis: Secondary | ICD-10-CM | POA: Diagnosis not present

## 2018-03-07 DIAGNOSIS — N186 End stage renal disease: Secondary | ICD-10-CM | POA: Diagnosis not present

## 2018-03-10 DIAGNOSIS — Z992 Dependence on renal dialysis: Secondary | ICD-10-CM | POA: Diagnosis not present

## 2018-03-10 DIAGNOSIS — N186 End stage renal disease: Secondary | ICD-10-CM | POA: Diagnosis not present

## 2018-03-12 DIAGNOSIS — Z992 Dependence on renal dialysis: Secondary | ICD-10-CM | POA: Diagnosis not present

## 2018-03-12 DIAGNOSIS — N186 End stage renal disease: Secondary | ICD-10-CM | POA: Diagnosis not present

## 2018-03-14 DIAGNOSIS — Z992 Dependence on renal dialysis: Secondary | ICD-10-CM | POA: Diagnosis not present

## 2018-03-14 DIAGNOSIS — N186 End stage renal disease: Secondary | ICD-10-CM | POA: Diagnosis not present

## 2018-03-17 ENCOUNTER — Other Ambulatory Visit: Payer: Self-pay | Admitting: Internal Medicine

## 2018-03-17 DIAGNOSIS — Z992 Dependence on renal dialysis: Secondary | ICD-10-CM | POA: Diagnosis not present

## 2018-03-17 DIAGNOSIS — N186 End stage renal disease: Secondary | ICD-10-CM | POA: Diagnosis not present

## 2018-03-19 DIAGNOSIS — N186 End stage renal disease: Secondary | ICD-10-CM | POA: Diagnosis not present

## 2018-03-19 DIAGNOSIS — Z992 Dependence on renal dialysis: Secondary | ICD-10-CM | POA: Diagnosis not present

## 2018-03-21 DIAGNOSIS — Z992 Dependence on renal dialysis: Secondary | ICD-10-CM | POA: Diagnosis not present

## 2018-03-21 DIAGNOSIS — N186 End stage renal disease: Secondary | ICD-10-CM | POA: Diagnosis not present

## 2018-03-24 DIAGNOSIS — Z992 Dependence on renal dialysis: Secondary | ICD-10-CM | POA: Diagnosis not present

## 2018-03-24 DIAGNOSIS — N186 End stage renal disease: Secondary | ICD-10-CM | POA: Diagnosis not present

## 2018-03-25 DIAGNOSIS — N186 End stage renal disease: Secondary | ICD-10-CM | POA: Diagnosis not present

## 2018-03-25 DIAGNOSIS — Z992 Dependence on renal dialysis: Secondary | ICD-10-CM | POA: Diagnosis not present

## 2018-03-26 ENCOUNTER — Encounter: Payer: Self-pay | Admitting: Internal Medicine

## 2018-03-26 ENCOUNTER — Ambulatory Visit (INDEPENDENT_AMBULATORY_CARE_PROVIDER_SITE_OTHER): Payer: Medicare HMO | Admitting: Internal Medicine

## 2018-03-26 VITALS — BP 130/74 | HR 58 | Temp 98.3°F | Ht 73.0 in | Wt 262.0 lb

## 2018-03-26 DIAGNOSIS — Z992 Dependence on renal dialysis: Secondary | ICD-10-CM | POA: Diagnosis not present

## 2018-03-26 DIAGNOSIS — E1121 Type 2 diabetes mellitus with diabetic nephropathy: Secondary | ICD-10-CM

## 2018-03-26 DIAGNOSIS — F39 Unspecified mood [affective] disorder: Secondary | ICD-10-CM

## 2018-03-26 DIAGNOSIS — I5032 Chronic diastolic (congestive) heart failure: Secondary | ICD-10-CM

## 2018-03-26 DIAGNOSIS — S069X0S Unspecified intracranial injury without loss of consciousness, sequela: Secondary | ICD-10-CM

## 2018-03-26 DIAGNOSIS — N186 End stage renal disease: Secondary | ICD-10-CM

## 2018-03-26 LAB — TSH: TSH: 1.79 u[IU]/mL (ref 0.35–4.50)

## 2018-03-26 LAB — LIPID PANEL
CHOLESTEROL: 126 mg/dL (ref 0–200)
HDL: 45.5 mg/dL (ref >39.00)
LDL Cholesterol: 55 mg/dL (ref 0–99)
NonHDL: 80.91
Total CHOL/HDL Ratio: 3
Triglycerides: 130 mg/dL (ref 0.0–149.0)
VLDL: 26 mg/dL (ref 0.0–40.0)

## 2018-03-26 LAB — HEMOGLOBIN A1C: HEMOGLOBIN A1C: 6 % (ref 4.6–6.5)

## 2018-03-26 LAB — HM DIABETES FOOT EXAM

## 2018-03-26 LAB — T4, FREE: FREE T4: 1.04 ng/dL (ref 0.60–1.60)

## 2018-03-26 NOTE — Assessment & Plan Note (Signed)
Doing well on this now

## 2018-03-26 NOTE — Assessment & Plan Note (Signed)
Seems to still have good control Will check A1c On dialysis

## 2018-03-26 NOTE — Assessment & Plan Note (Signed)
Depression is controlled on the citalopram Will continue
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
## 2018-03-26 NOTE — Assessment & Plan Note (Signed)
Ongoing mild cognitive sequelae Not as much of an issue since he is done with working

## 2018-03-26 NOTE — Assessment & Plan Note (Signed)
Compensated Weighs regularly No med changes needed

## 2018-03-28 DIAGNOSIS — Z992 Dependence on renal dialysis: Secondary | ICD-10-CM | POA: Diagnosis not present

## 2018-03-28 DIAGNOSIS — N186 End stage renal disease: Secondary | ICD-10-CM | POA: Diagnosis not present

## 2018-03-31 DIAGNOSIS — Z992 Dependence on renal dialysis: Secondary | ICD-10-CM | POA: Diagnosis not present

## 2018-03-31 DIAGNOSIS — N186 End stage renal disease: Secondary | ICD-10-CM | POA: Diagnosis not present

## 2018-04-01 ENCOUNTER — Other Ambulatory Visit: Payer: Self-pay

## 2018-04-01 ENCOUNTER — Encounter: Payer: Self-pay | Admitting: Internal Medicine

## 2018-04-02 DIAGNOSIS — N186 End stage renal disease: Secondary | ICD-10-CM | POA: Diagnosis not present

## 2018-04-02 DIAGNOSIS — Z992 Dependence on renal dialysis: Secondary | ICD-10-CM | POA: Diagnosis not present

## 2018-04-04 DIAGNOSIS — Z992 Dependence on renal dialysis: Secondary | ICD-10-CM | POA: Diagnosis not present

## 2018-04-04 DIAGNOSIS — N186 End stage renal disease: Secondary | ICD-10-CM | POA: Diagnosis not present

## 2018-04-07 DIAGNOSIS — N186 End stage renal disease: Secondary | ICD-10-CM | POA: Diagnosis not present

## 2018-04-07 DIAGNOSIS — Z992 Dependence on renal dialysis: Secondary | ICD-10-CM | POA: Diagnosis not present

## 2018-04-09 DIAGNOSIS — N186 End stage renal disease: Secondary | ICD-10-CM | POA: Diagnosis not present

## 2018-04-09 DIAGNOSIS — Z992 Dependence on renal dialysis: Secondary | ICD-10-CM | POA: Diagnosis not present

## 2018-04-11 DIAGNOSIS — N186 End stage renal disease: Secondary | ICD-10-CM | POA: Diagnosis not present

## 2018-04-11 DIAGNOSIS — Z992 Dependence on renal dialysis: Secondary | ICD-10-CM | POA: Diagnosis not present

## 2018-04-14 DIAGNOSIS — Z992 Dependence on renal dialysis: Secondary | ICD-10-CM | POA: Diagnosis not present

## 2018-04-14 DIAGNOSIS — N186 End stage renal disease: Secondary | ICD-10-CM | POA: Diagnosis not present

## 2018-04-16 DIAGNOSIS — Z992 Dependence on renal dialysis: Secondary | ICD-10-CM | POA: Diagnosis not present

## 2018-04-16 DIAGNOSIS — N186 End stage renal disease: Secondary | ICD-10-CM | POA: Diagnosis not present

## 2018-04-18 DIAGNOSIS — Z992 Dependence on renal dialysis: Secondary | ICD-10-CM | POA: Diagnosis not present

## 2018-04-18 DIAGNOSIS — N186 End stage renal disease: Secondary | ICD-10-CM | POA: Diagnosis not present

## 2018-04-20 DIAGNOSIS — N186 End stage renal disease: Secondary | ICD-10-CM | POA: Diagnosis not present

## 2018-04-20 DIAGNOSIS — Z992 Dependence on renal dialysis: Secondary | ICD-10-CM | POA: Diagnosis not present

## 2018-04-21 DIAGNOSIS — Z992 Dependence on renal dialysis: Secondary | ICD-10-CM | POA: Diagnosis not present

## 2018-04-21 DIAGNOSIS — N186 End stage renal disease: Secondary | ICD-10-CM | POA: Diagnosis not present

## 2018-04-23 DIAGNOSIS — N186 End stage renal disease: Secondary | ICD-10-CM | POA: Diagnosis not present

## 2018-04-23 DIAGNOSIS — Z992 Dependence on renal dialysis: Secondary | ICD-10-CM | POA: Diagnosis not present

## 2018-04-25 DIAGNOSIS — N186 End stage renal disease: Secondary | ICD-10-CM | POA: Diagnosis not present

## 2018-04-25 DIAGNOSIS — Z992 Dependence on renal dialysis: Secondary | ICD-10-CM | POA: Diagnosis not present

## 2018-04-28 DIAGNOSIS — E119 Type 2 diabetes mellitus without complications: Secondary | ICD-10-CM | POA: Diagnosis not present

## 2018-04-28 DIAGNOSIS — N186 End stage renal disease: Secondary | ICD-10-CM | POA: Diagnosis not present

## 2018-04-28 DIAGNOSIS — Z794 Long term (current) use of insulin: Secondary | ICD-10-CM | POA: Diagnosis not present

## 2018-04-28 DIAGNOSIS — Z992 Dependence on renal dialysis: Secondary | ICD-10-CM | POA: Diagnosis not present

## 2018-04-30 DIAGNOSIS — N186 End stage renal disease: Secondary | ICD-10-CM | POA: Diagnosis not present

## 2018-04-30 DIAGNOSIS — Z992 Dependence on renal dialysis: Secondary | ICD-10-CM | POA: Diagnosis not present

## 2018-04-30 DIAGNOSIS — Z7682 Awaiting organ transplant status: Secondary | ICD-10-CM | POA: Diagnosis not present

## 2018-05-02 DIAGNOSIS — Z992 Dependence on renal dialysis: Secondary | ICD-10-CM | POA: Diagnosis not present

## 2018-05-02 DIAGNOSIS — N186 End stage renal disease: Secondary | ICD-10-CM | POA: Diagnosis not present

## 2018-05-05 DIAGNOSIS — Z992 Dependence on renal dialysis: Secondary | ICD-10-CM | POA: Diagnosis not present

## 2018-05-05 DIAGNOSIS — N186 End stage renal disease: Secondary | ICD-10-CM | POA: Diagnosis not present

## 2018-05-07 DIAGNOSIS — E113293 Type 2 diabetes mellitus with mild nonproliferative diabetic retinopathy without macular edema, bilateral: Secondary | ICD-10-CM | POA: Diagnosis not present

## 2018-05-07 DIAGNOSIS — N186 End stage renal disease: Secondary | ICD-10-CM | POA: Diagnosis not present

## 2018-05-07 DIAGNOSIS — Z992 Dependence on renal dialysis: Secondary | ICD-10-CM | POA: Diagnosis not present

## 2018-05-07 LAB — HM DIABETES EYE EXAM

## 2018-05-09 DIAGNOSIS — Z992 Dependence on renal dialysis: Secondary | ICD-10-CM | POA: Diagnosis not present

## 2018-05-09 DIAGNOSIS — N186 End stage renal disease: Secondary | ICD-10-CM | POA: Diagnosis not present

## 2018-05-12 DIAGNOSIS — N186 End stage renal disease: Secondary | ICD-10-CM | POA: Diagnosis not present

## 2018-05-12 DIAGNOSIS — Z992 Dependence on renal dialysis: Secondary | ICD-10-CM | POA: Diagnosis not present

## 2018-05-14 DIAGNOSIS — N186 End stage renal disease: Secondary | ICD-10-CM | POA: Diagnosis not present

## 2018-05-14 DIAGNOSIS — Z992 Dependence on renal dialysis: Secondary | ICD-10-CM | POA: Diagnosis not present

## 2018-05-16 DIAGNOSIS — N186 End stage renal disease: Secondary | ICD-10-CM | POA: Diagnosis not present

## 2018-05-16 DIAGNOSIS — Z992 Dependence on renal dialysis: Secondary | ICD-10-CM | POA: Diagnosis not present

## 2018-05-19 ENCOUNTER — Other Ambulatory Visit: Payer: Self-pay | Admitting: Internal Medicine

## 2018-05-19 DIAGNOSIS — Z992 Dependence on renal dialysis: Secondary | ICD-10-CM | POA: Diagnosis not present

## 2018-05-19 DIAGNOSIS — E119 Type 2 diabetes mellitus without complications: Secondary | ICD-10-CM | POA: Diagnosis not present

## 2018-05-19 DIAGNOSIS — N186 End stage renal disease: Secondary | ICD-10-CM | POA: Diagnosis not present

## 2018-05-21 DIAGNOSIS — N186 End stage renal disease: Secondary | ICD-10-CM | POA: Diagnosis not present

## 2018-05-21 DIAGNOSIS — Z992 Dependence on renal dialysis: Secondary | ICD-10-CM | POA: Diagnosis not present

## 2018-05-23 ENCOUNTER — Other Ambulatory Visit: Payer: Self-pay | Admitting: Internal Medicine

## 2018-05-23 ENCOUNTER — Other Ambulatory Visit: Payer: Self-pay | Admitting: Urology

## 2018-05-23 DIAGNOSIS — N401 Enlarged prostate with lower urinary tract symptoms: Secondary | ICD-10-CM

## 2018-05-23 DIAGNOSIS — Z992 Dependence on renal dialysis: Secondary | ICD-10-CM | POA: Diagnosis not present

## 2018-05-23 DIAGNOSIS — N186 End stage renal disease: Secondary | ICD-10-CM | POA: Diagnosis not present

## 2018-05-26 DIAGNOSIS — Z992 Dependence on renal dialysis: Secondary | ICD-10-CM | POA: Diagnosis not present

## 2018-05-26 DIAGNOSIS — N186 End stage renal disease: Secondary | ICD-10-CM | POA: Diagnosis not present

## 2018-05-27 DIAGNOSIS — Z125 Encounter for screening for malignant neoplasm of prostate: Secondary | ICD-10-CM | POA: Diagnosis not present

## 2018-05-27 DIAGNOSIS — Z1159 Encounter for screening for other viral diseases: Secondary | ICD-10-CM | POA: Diagnosis not present

## 2018-05-27 DIAGNOSIS — E1122 Type 2 diabetes mellitus with diabetic chronic kidney disease: Secondary | ICD-10-CM | POA: Diagnosis not present

## 2018-05-27 DIAGNOSIS — Z949 Transplanted organ and tissue status, unspecified: Secondary | ICD-10-CM | POA: Diagnosis not present

## 2018-05-27 DIAGNOSIS — I10 Essential (primary) hypertension: Secondary | ICD-10-CM | POA: Diagnosis not present

## 2018-05-27 DIAGNOSIS — Z7682 Awaiting organ transplant status: Secondary | ICD-10-CM | POA: Diagnosis not present

## 2018-05-27 DIAGNOSIS — I120 Hypertensive chronic kidney disease with stage 5 chronic kidney disease or end stage renal disease: Secondary | ICD-10-CM | POA: Diagnosis not present

## 2018-05-27 DIAGNOSIS — N186 End stage renal disease: Secondary | ICD-10-CM | POA: Diagnosis not present

## 2018-05-27 DIAGNOSIS — Z992 Dependence on renal dialysis: Secondary | ICD-10-CM | POA: Diagnosis not present

## 2018-05-28 DIAGNOSIS — Z992 Dependence on renal dialysis: Secondary | ICD-10-CM | POA: Diagnosis not present

## 2018-05-28 DIAGNOSIS — N186 End stage renal disease: Secondary | ICD-10-CM | POA: Diagnosis not present

## 2018-05-30 DIAGNOSIS — N186 End stage renal disease: Secondary | ICD-10-CM | POA: Diagnosis not present

## 2018-05-30 DIAGNOSIS — Z992 Dependence on renal dialysis: Secondary | ICD-10-CM | POA: Diagnosis not present

## 2018-05-31 IMAGING — CT CT ABD-PELV W/O CM
2 of 7 series · 14 of 46 positions shown, 18 images · non-contrast
Comparison: CT scan 03/22/2016

CLINICAL DATA: Abdominal pain. History of complications following
transfusion for peritoneal dialysis.

EXAM:
CT ABDOMEN AND PELVIS WITHOUT CONTRAST
TECHNIQUE: Multidetector CT imaging of the abdomen and pelvis was performed
following the standard protocol without IV contrast.

[Series 2: abd pel wo · axial · 0.87mm/px · z∈[-926,-476]mm · 11 of 102 slices shown, 15 images]
[im 6/102  soft-tissue]
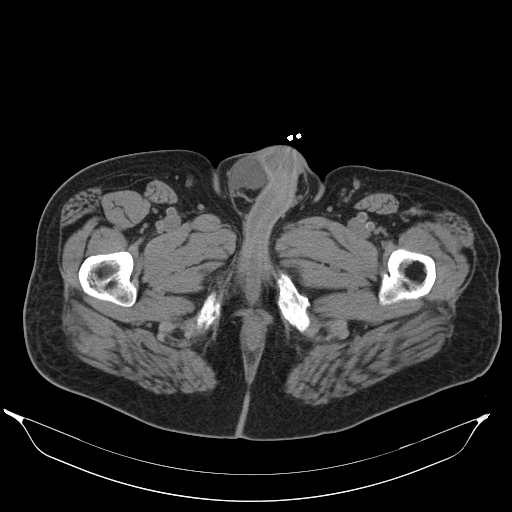
[im 6/102  bone]
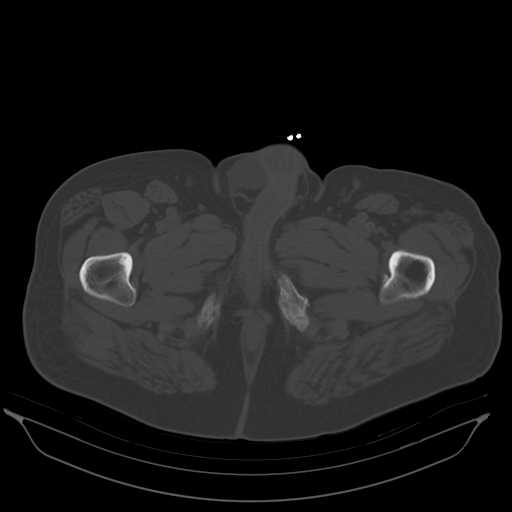
[im 17/102  soft-tissue]
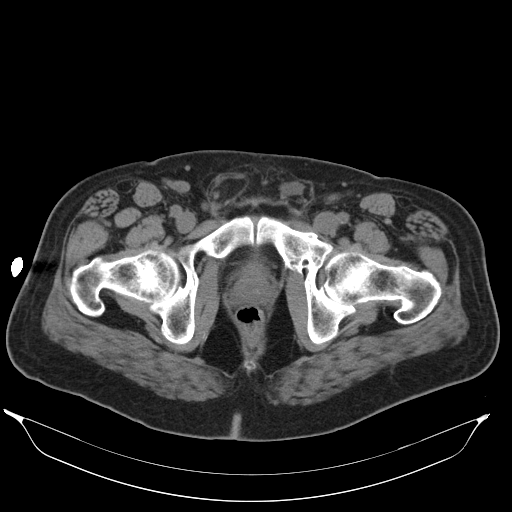
[im 29/102  soft-tissue]
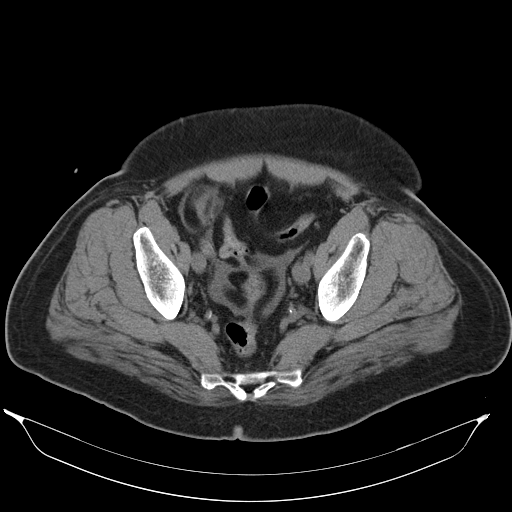
[im 40/102  soft-tissue]
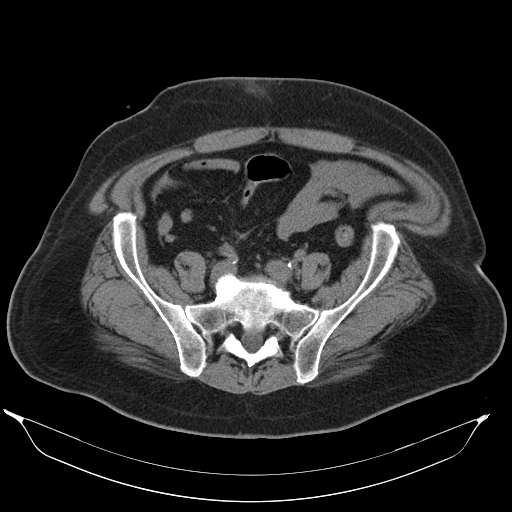
[im 51/102  soft-tissue]
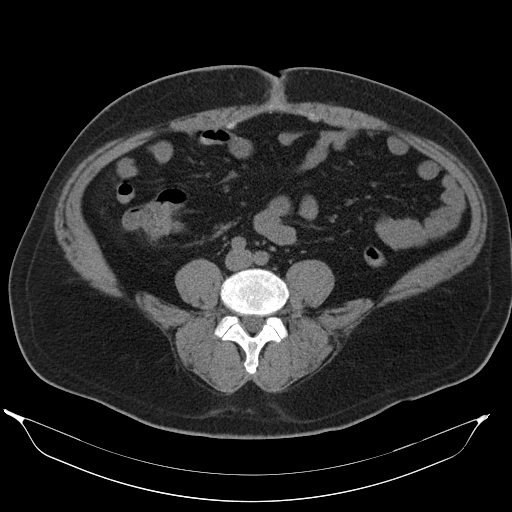
[im 62/102  soft-tissue]
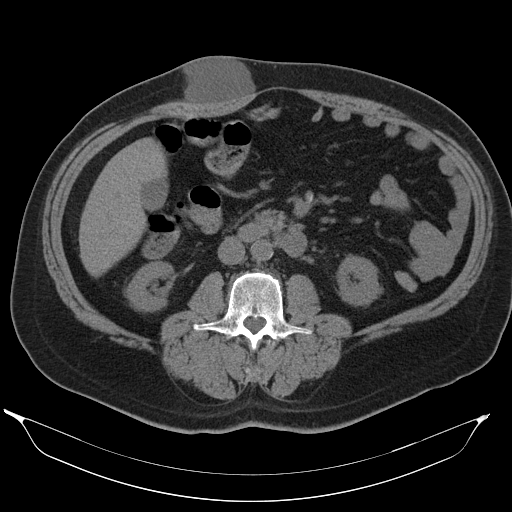
[im 73/102  soft-tissue]
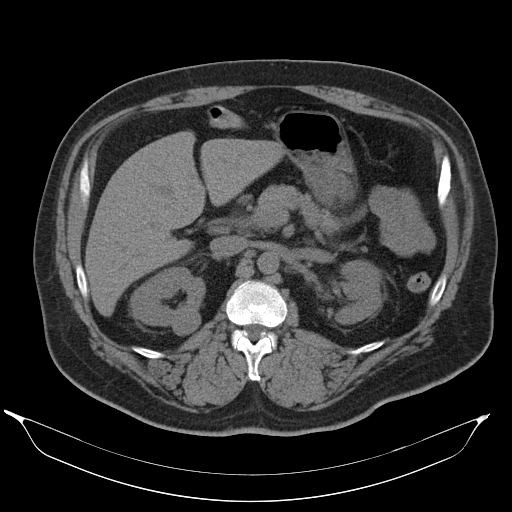
[im 79/102  lung]
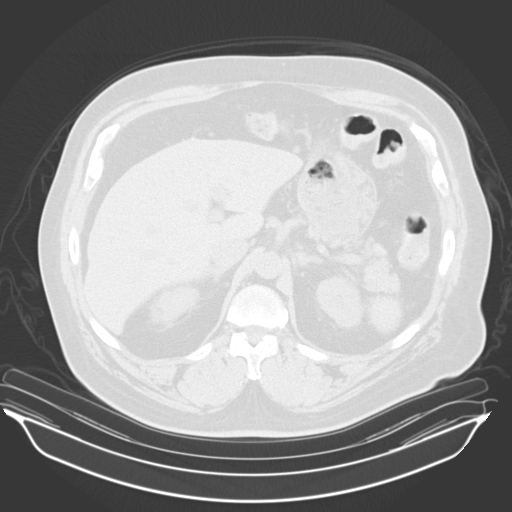
[im 85/102  soft-tissue]
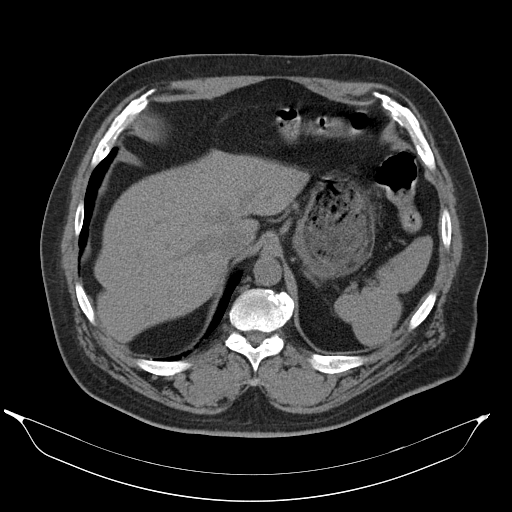
[im 85/102  lung]
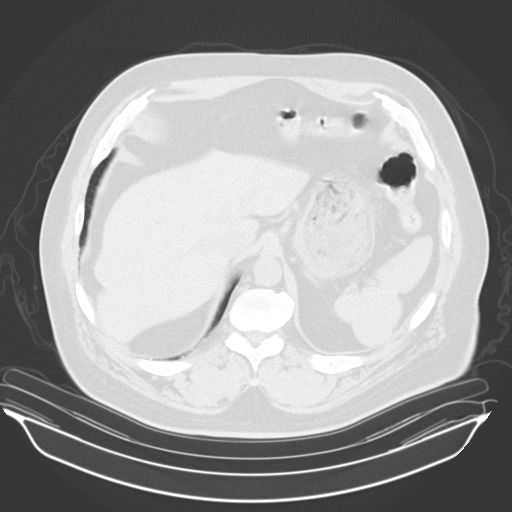
[im 90/102  lung]
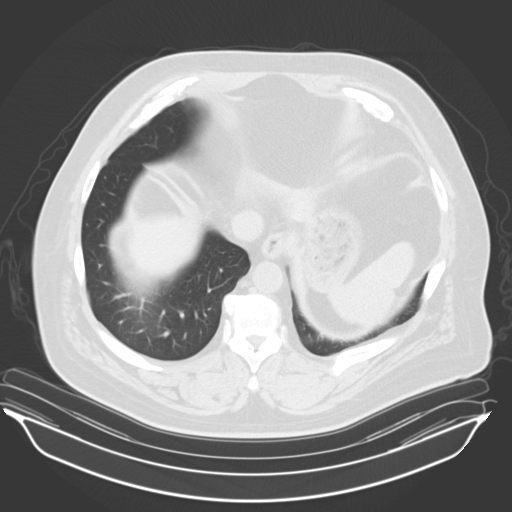
[im 96/102  soft-tissue]
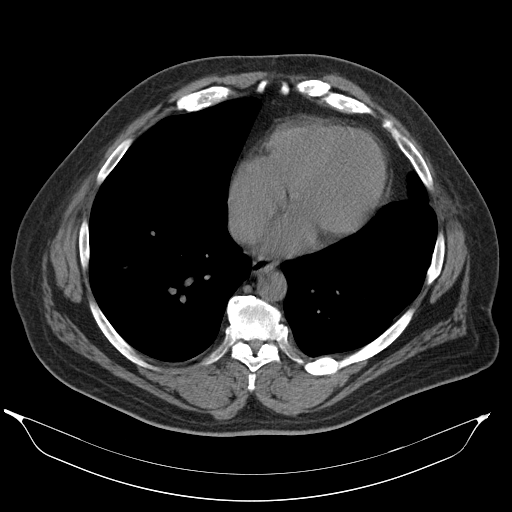
[im 96/102  lung]
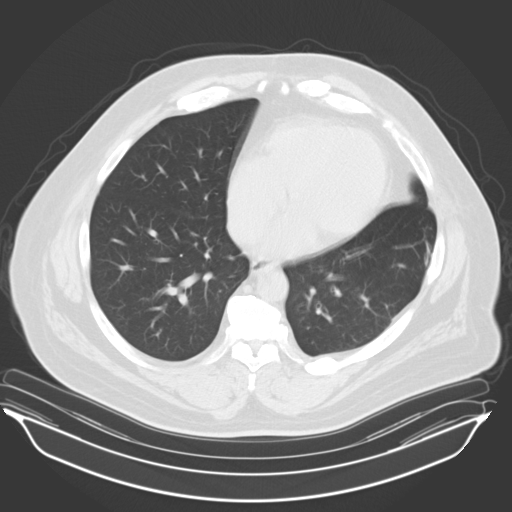
[im 96/102  bone]
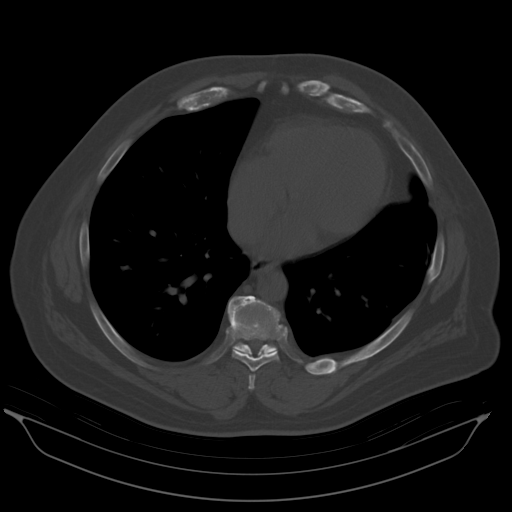

[Series 602: coronal · coronal · 1.01mm/px · 3 of 126 slices shown]
[im 32/126  soft-tissue]
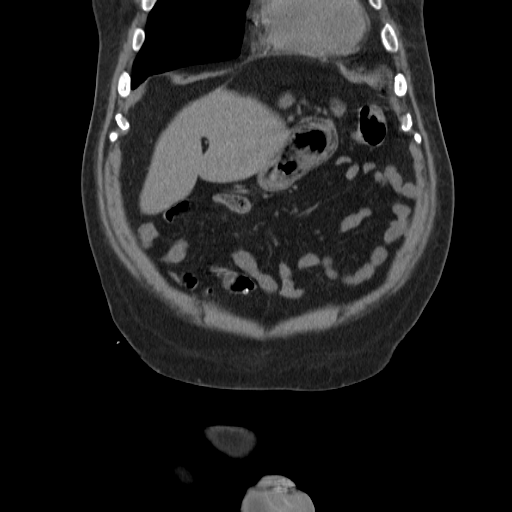
[im 63/126  soft-tissue]
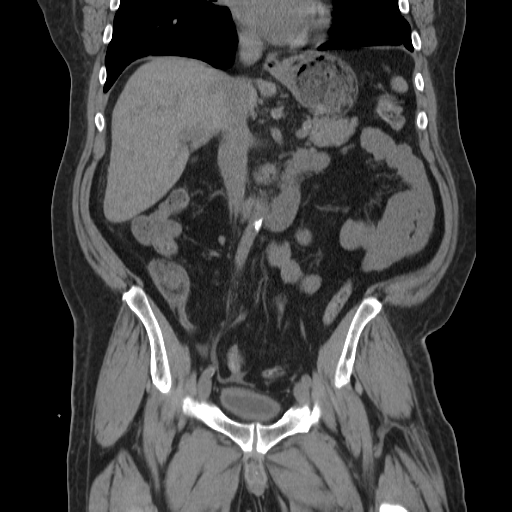
[im 94/126  soft-tissue]
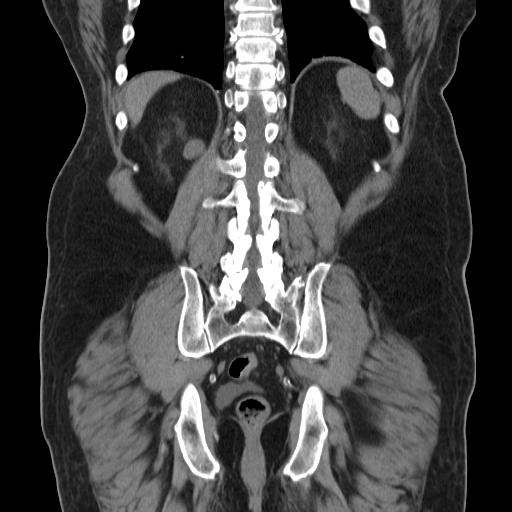

[14 of 46 positions shown; findings below may reference images not displayed]

FINDINGS: Lower chest: The lung bases are clear of acute process. No pleural
effusion or pulmonary lesions. The heart is normal in size. No
pericardial effusion. The distal esophagus and aorta are
unremarkable.

Hepatobiliary: No focal hepatic lesions or intrahepatic biliary
dilatation. Gallbladder is grossly normal. No common bile duct
dilatation.

Pancreas: No mass, inflammation or ductal dilatation.

Spleen: Irregular contour may be due to prior splenic infarcts. No
acute abnormality. Multiple endovascular coils are noted in the
splenic artery.

Adrenals/Urinary Tract: The adrenal glands and kidneys are stable.
Multiple renal cysts. No renal or ureteral calculi. Left renal
scarring changes. Bladder is grossly normal.

Stomach/Bowel: The stomach, duodenum, small bowel and colon are
grossly normal without oral contrast. No acute inflammatory changes,
mass lesions or obstructive findings. The terminal ileum is normal.
The appendix is normal.

Vascular/Lymphatic: Moderate vascular calcifications involving the
aorta and branch vessels but no aneurysm. No mesenteric or
retroperitoneal mass or adenopathy.

Reproductive: The prostate gland and seminal vesicles are
unremarkable. There is a moderate-sized right scrotal hydrocele.

Other: There are fluid collections in the anterior abdominal wall,
between the right rectus muscle on the skin. The lower fluid
collection surrounds the peritoneal dialysis catheter which could
suggest leaking intraperitoneal fluid around the catheter or
possibly a defect or hole in the catheter. This fluid collection
measures a maximum of 6.7 cm. The more cephalad fluid collection
demonstrates mild mass effect on the right rectus muscle. It
measures 5.9 x 4.0 cm. It does not appear to communicate with the
lower fluid collection and both of these collections appear simple
based on the Hounsfield units.

The peritoneal dialysis catheter is in the right pelvis and there is
expected fluid in the dependent pelvis.

Musculoskeletal: No significant bony findings.
IMPRESSION: 1. Two anterior abdominal wall fluid collections as described above.
The lower collection surrounds the peritoneal dialysis catheter.
Please see above discussion.
2. No significant intra-abdominal/intrapelvic abnormalities other
than the peritoneal dialysis catheter in the right pelvis and
moderate free pelvic fluid which is expected.
3. Right scrotal hydrocele.

## 2018-06-02 DIAGNOSIS — Z992 Dependence on renal dialysis: Secondary | ICD-10-CM | POA: Diagnosis not present

## 2018-06-02 DIAGNOSIS — N186 End stage renal disease: Secondary | ICD-10-CM | POA: Diagnosis not present
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
## 2018-06-04 ENCOUNTER — Ambulatory Visit: Payer: Medicare HMO | Admitting: Urology

## 2018-06-04 ENCOUNTER — Encounter: Payer: Self-pay | Admitting: Urology

## 2018-06-04 VITALS — BP 165/79 | HR 63 | Ht 73.0 in | Wt 258.9 lb

## 2018-06-04 DIAGNOSIS — N186 End stage renal disease: Secondary | ICD-10-CM

## 2018-06-04 DIAGNOSIS — R3989 Other symptoms and signs involving the genitourinary system: Secondary | ICD-10-CM | POA: Diagnosis not present

## 2018-06-04 DIAGNOSIS — Z87898 Personal history of other specified conditions: Secondary | ICD-10-CM | POA: Diagnosis not present

## 2018-06-04 DIAGNOSIS — Z992 Dependence on renal dialysis: Secondary | ICD-10-CM

## 2018-06-04 DIAGNOSIS — N138 Other obstructive and reflux uropathy: Secondary | ICD-10-CM

## 2018-06-04 DIAGNOSIS — R3915 Urgency of urination: Secondary | ICD-10-CM | POA: Diagnosis not present

## 2018-06-04 DIAGNOSIS — Z87448 Personal history of other diseases of urinary system: Secondary | ICD-10-CM | POA: Diagnosis not present

## 2018-06-04 DIAGNOSIS — N401 Enlarged prostate with lower urinary tract symptoms: Secondary | ICD-10-CM

## 2018-06-04 LAB — BLADDER SCAN AMB NON-IMAGING: Scan Result: 15

## 2018-06-05 ENCOUNTER — Telehealth: Payer: Self-pay | Admitting: Urology

## 2018-06-05 NOTE — Telephone Encounter (Signed)
Please let Richard Chen know I have spoken with the [place] [facility] Team and they do not feel a biopsy is necessary at this time.  We do need to check his urine, so he needs to come by the office to drop off a specimen.

## 2018-06-06 ENCOUNTER — Other Ambulatory Visit: Payer: Self-pay | Admitting: Family Medicine

## 2018-06-06 DIAGNOSIS — Z992 Dependence on renal dialysis: Secondary | ICD-10-CM | POA: Diagnosis not present

## 2018-06-06 DIAGNOSIS — Z87448 Personal history of other diseases of urinary system: Secondary | ICD-10-CM

## 2018-06-06 DIAGNOSIS — N186 End stage renal disease: Secondary | ICD-10-CM | POA: Diagnosis not present

## 2018-06-06 NOTE — Telephone Encounter (Signed)
Patient notified and will come Monday for a UA

## 2018-06-09 ENCOUNTER — Other Ambulatory Visit: Payer: Self-pay | Admitting: Urology

## 2018-06-09 ENCOUNTER — Other Ambulatory Visit: Payer: Medicare HMO

## 2018-06-09 DIAGNOSIS — R3915 Urgency of urination: Secondary | ICD-10-CM | POA: Diagnosis not present

## 2018-06-09 DIAGNOSIS — N186 End stage renal disease: Secondary | ICD-10-CM | POA: Diagnosis not present

## 2018-06-09 DIAGNOSIS — Z992 Dependence on renal dialysis: Secondary | ICD-10-CM | POA: Diagnosis not present

## 2018-06-09 DIAGNOSIS — Z87448 Personal history of other diseases of urinary system: Secondary | ICD-10-CM

## 2018-06-09 LAB — URINALYSIS, COMPLETE
BILIRUBIN UA: NEGATIVE
GLUCOSE, UA: NEGATIVE
Ketones, UA: NEGATIVE
Nitrite, UA: NEGATIVE
Specific Gravity, UA: 1.025 (ref 1.005–1.030)
UUROB: 0.2 mg/dL (ref 0.2–1.0)
pH, UA: 6 (ref 5.0–7.5)

## 2018-06-09 LAB — MICROSCOPIC EXAMINATION

## 2018-06-09 NOTE — Progress Notes (Signed)
Urine culture order in.

## 2018-06-11 ENCOUNTER — Telehealth: Payer: Self-pay | Admitting: Urology

## 2018-06-11 DIAGNOSIS — N186 End stage renal disease: Secondary | ICD-10-CM | POA: Diagnosis not present

## 2018-06-11 DIAGNOSIS — Z992 Dependence on renal dialysis: Secondary | ICD-10-CM | POA: Diagnosis not present

## 2018-06-11 NOTE — Telephone Encounter (Signed)
Richard Chen w/K[facility]ney Transplant @ [facility] called to let you know that no biopsy is needed at this time and to check PSA yearly.  Just F.Y.I.

## 2018-06-13 DIAGNOSIS — N186 End stage renal disease: Secondary | ICD-10-CM | POA: Diagnosis not present

## 2018-06-13 DIAGNOSIS — Z992 Dependence on renal dialysis: Secondary | ICD-10-CM | POA: Diagnosis not present

## 2018-06-13 LAB — CULTURE, URINE COMPREHENSIVE

## 2018-06-16 DIAGNOSIS — N186 End stage renal disease: Secondary | ICD-10-CM | POA: Diagnosis not present

## 2018-06-16 DIAGNOSIS — Z992 Dependence on renal dialysis: Secondary | ICD-10-CM | POA: Diagnosis not present

## 2018-06-18 DIAGNOSIS — Z992 Dependence on renal dialysis: Secondary | ICD-10-CM | POA: Diagnosis not present

## 2018-06-18 DIAGNOSIS — N186 End stage renal disease: Secondary | ICD-10-CM | POA: Diagnosis not present

## 2018-06-19 ENCOUNTER — Telehealth: Payer: Self-pay

## 2018-06-19 MED ORDER — CIPROFLOXACIN HCL 250 MG PO TABS: 250.0000 mg | ORAL_TABLET | ORAL | 0 refills | Status: AC

## 2018-06-19 NOTE — Telephone Encounter (Signed)
-----   Message from Abbie Sons, MD sent at 06/17/2018  8:55 PM EDT ----- Please send in rx cipro 250 mg every other day X 5 doses

## 2018-06-20 DIAGNOSIS — N186 End stage renal disease: Secondary | ICD-10-CM | POA: Diagnosis not present

## 2018-06-20 DIAGNOSIS — Z992 Dependence on renal dialysis: Secondary | ICD-10-CM | POA: Diagnosis not present

## 2018-06-21 DIAGNOSIS — N186 End stage renal disease: Secondary | ICD-10-CM | POA: Diagnosis not present

## 2018-06-21 DIAGNOSIS — Z992 Dependence on renal dialysis: Secondary | ICD-10-CM | POA: Diagnosis not present

## 2018-06-23 DIAGNOSIS — Z23 Encounter for immunization: Secondary | ICD-10-CM | POA: Diagnosis not present

## 2018-06-23 DIAGNOSIS — N2581 Secondary hyperparathyroidism of renal origin: Secondary | ICD-10-CM | POA: Diagnosis not present

## 2018-06-23 DIAGNOSIS — N186 End stage renal disease: Secondary | ICD-10-CM | POA: Diagnosis not present

## 2018-06-23 DIAGNOSIS — Z992 Dependence on renal dialysis: Secondary | ICD-10-CM | POA: Diagnosis not present

## 2018-06-25 DIAGNOSIS — Z992 Dependence on renal dialysis: Secondary | ICD-10-CM | POA: Diagnosis not present

## 2018-06-25 DIAGNOSIS — N186 End stage renal disease: Secondary | ICD-10-CM | POA: Diagnosis not present

## 2018-06-25 DIAGNOSIS — Z23 Encounter for immunization: Secondary | ICD-10-CM | POA: Diagnosis not present

## 2018-06-25 DIAGNOSIS — N2581 Secondary hyperparathyroidism of renal origin: Secondary | ICD-10-CM | POA: Diagnosis not present

## 2018-06-27 DIAGNOSIS — Z23 Encounter for immunization: Secondary | ICD-10-CM | POA: Diagnosis not present

## 2018-06-27 DIAGNOSIS — N2581 Secondary hyperparathyroidism of renal origin: Secondary | ICD-10-CM | POA: Diagnosis not present

## 2018-06-27 DIAGNOSIS — Z992 Dependence on renal dialysis: Secondary | ICD-10-CM | POA: Diagnosis not present

## 2018-06-27 DIAGNOSIS — N186 End stage renal disease: Secondary | ICD-10-CM | POA: Diagnosis not present

## 2018-06-30 DIAGNOSIS — Z23 Encounter for immunization: Secondary | ICD-10-CM | POA: Diagnosis not present

## 2018-06-30 DIAGNOSIS — Z992 Dependence on renal dialysis: Secondary | ICD-10-CM | POA: Diagnosis not present

## 2018-06-30 DIAGNOSIS — N186 End stage renal disease: Secondary | ICD-10-CM | POA: Diagnosis not present

## 2018-06-30 DIAGNOSIS — N2581 Secondary hyperparathyroidism of renal origin: Secondary | ICD-10-CM | POA: Diagnosis not present

## 2018-07-02 DIAGNOSIS — N186 End stage renal disease: Secondary | ICD-10-CM | POA: Diagnosis not present

## 2018-07-02 DIAGNOSIS — N2581 Secondary hyperparathyroidism of renal origin: Secondary | ICD-10-CM | POA: Diagnosis not present

## 2018-07-02 DIAGNOSIS — Z992 Dependence on renal dialysis: Secondary | ICD-10-CM | POA: Diagnosis not present

## 2018-07-02 DIAGNOSIS — Z23 Encounter for immunization: Secondary | ICD-10-CM | POA: Diagnosis not present

## 2018-07-04 DIAGNOSIS — Z992 Dependence on renal dialysis: Secondary | ICD-10-CM | POA: Diagnosis not present

## 2018-07-04 DIAGNOSIS — Z23 Encounter for immunization: Secondary | ICD-10-CM | POA: Diagnosis not present

## 2018-07-04 DIAGNOSIS — N2581 Secondary hyperparathyroidism of renal origin: Secondary | ICD-10-CM | POA: Diagnosis not present

## 2018-07-04 DIAGNOSIS — N186 End stage renal disease: Secondary | ICD-10-CM | POA: Diagnosis not present

## 2018-07-07 DIAGNOSIS — Z992 Dependence on renal dialysis: Secondary | ICD-10-CM | POA: Diagnosis not present

## 2018-07-07 DIAGNOSIS — Z23 Encounter for immunization: Secondary | ICD-10-CM | POA: Diagnosis not present

## 2018-07-07 DIAGNOSIS — N186 End stage renal disease: Secondary | ICD-10-CM | POA: Diagnosis not present

## 2018-07-07 DIAGNOSIS — N2581 Secondary hyperparathyroidism of renal origin: Secondary | ICD-10-CM | POA: Diagnosis not present

## 2018-07-09 DIAGNOSIS — N2581 Secondary hyperparathyroidism of renal origin: Secondary | ICD-10-CM | POA: Diagnosis not present

## 2018-07-09 DIAGNOSIS — Z23 Encounter for immunization: Secondary | ICD-10-CM | POA: Diagnosis not present

## 2018-07-09 DIAGNOSIS — N186 End stage renal disease: Secondary | ICD-10-CM | POA: Diagnosis not present

## 2018-07-09 DIAGNOSIS — Z992 Dependence on renal dialysis: Secondary | ICD-10-CM | POA: Diagnosis not present

## 2018-07-11 DIAGNOSIS — Z992 Dependence on renal dialysis: Secondary | ICD-10-CM | POA: Diagnosis not present

## 2018-07-11 DIAGNOSIS — N2581 Secondary hyperparathyroidism of renal origin: Secondary | ICD-10-CM | POA: Diagnosis not present

## 2018-07-11 DIAGNOSIS — N186 End stage renal disease: Secondary | ICD-10-CM | POA: Diagnosis not present

## 2018-07-11 DIAGNOSIS — Z23 Encounter for immunization: Secondary | ICD-10-CM | POA: Diagnosis not present

## 2018-07-14 DIAGNOSIS — N186 End stage renal disease: Secondary | ICD-10-CM | POA: Diagnosis not present

## 2018-07-14 DIAGNOSIS — Z23 Encounter for immunization: Secondary | ICD-10-CM | POA: Diagnosis not present

## 2018-07-14 DIAGNOSIS — Z992 Dependence on renal dialysis: Secondary | ICD-10-CM | POA: Diagnosis not present

## 2018-07-14 DIAGNOSIS — N2581 Secondary hyperparathyroidism of renal origin: Secondary | ICD-10-CM | POA: Diagnosis not present

## 2018-07-16 DIAGNOSIS — N2581 Secondary hyperparathyroidism of renal origin: Secondary | ICD-10-CM | POA: Diagnosis not present

## 2018-07-16 DIAGNOSIS — N186 End stage renal disease: Secondary | ICD-10-CM | POA: Diagnosis not present

## 2018-07-16 DIAGNOSIS — Z992 Dependence on renal dialysis: Secondary | ICD-10-CM | POA: Diagnosis not present

## 2018-07-16 DIAGNOSIS — Z23 Encounter for immunization: Secondary | ICD-10-CM | POA: Diagnosis not present

## 2018-07-18 DIAGNOSIS — Z992 Dependence on renal dialysis: Secondary | ICD-10-CM | POA: Diagnosis not present

## 2018-07-18 DIAGNOSIS — N186 End stage renal disease: Secondary | ICD-10-CM | POA: Diagnosis not present

## 2018-07-18 DIAGNOSIS — N2581 Secondary hyperparathyroidism of renal origin: Secondary | ICD-10-CM | POA: Diagnosis not present

## 2018-07-18 DIAGNOSIS — Z23 Encounter for immunization: Secondary | ICD-10-CM | POA: Diagnosis not present

## 2018-07-21 DIAGNOSIS — Z992 Dependence on renal dialysis: Secondary | ICD-10-CM | POA: Diagnosis not present

## 2018-07-21 DIAGNOSIS — N186 End stage renal disease: Secondary | ICD-10-CM | POA: Diagnosis not present

## 2018-07-21 DIAGNOSIS — N2581 Secondary hyperparathyroidism of renal origin: Secondary | ICD-10-CM | POA: Diagnosis not present

## 2018-07-21 DIAGNOSIS — Z23 Encounter for immunization: Secondary | ICD-10-CM | POA: Diagnosis not present

## 2018-07-23 DIAGNOSIS — N186 End stage renal disease: Secondary | ICD-10-CM | POA: Diagnosis not present

## 2018-07-23 DIAGNOSIS — Z23 Encounter for immunization: Secondary | ICD-10-CM | POA: Diagnosis not present

## 2018-07-23 DIAGNOSIS — Z992 Dependence on renal dialysis: Secondary | ICD-10-CM | POA: Diagnosis not present

## 2018-07-23 DIAGNOSIS — N2581 Secondary hyperparathyroidism of renal origin: Secondary | ICD-10-CM | POA: Diagnosis not present

## 2018-07-25 DIAGNOSIS — N2581 Secondary hyperparathyroidism of renal origin: Secondary | ICD-10-CM | POA: Diagnosis not present

## 2018-07-25 DIAGNOSIS — Z992 Dependence on renal dialysis: Secondary | ICD-10-CM | POA: Diagnosis not present

## 2018-07-25 DIAGNOSIS — N186 End stage renal disease: Secondary | ICD-10-CM | POA: Diagnosis not present

## 2018-07-25 DIAGNOSIS — Z23 Encounter for immunization: Secondary | ICD-10-CM | POA: Diagnosis not present

## 2018-07-28 ENCOUNTER — Other Ambulatory Visit (INDEPENDENT_AMBULATORY_CARE_PROVIDER_SITE_OTHER): Payer: Self-pay | Admitting: Vascular Surgery

## 2018-07-28 DIAGNOSIS — N186 End stage renal disease: Secondary | ICD-10-CM

## 2018-07-28 DIAGNOSIS — Z23 Encounter for immunization: Secondary | ICD-10-CM | POA: Diagnosis not present

## 2018-07-28 DIAGNOSIS — Z7682 Awaiting organ transplant status: Secondary | ICD-10-CM | POA: Diagnosis not present

## 2018-07-28 DIAGNOSIS — N2581 Secondary hyperparathyroidism of renal origin: Secondary | ICD-10-CM | POA: Diagnosis not present

## 2018-07-28 DIAGNOSIS — Z992 Dependence on renal dialysis: Secondary | ICD-10-CM | POA: Diagnosis not present

## 2018-07-29 ENCOUNTER — Ambulatory Visit (INDEPENDENT_AMBULATORY_CARE_PROVIDER_SITE_OTHER): Payer: Medicare HMO

## 2018-07-29 ENCOUNTER — Ambulatory Visit (INDEPENDENT_AMBULATORY_CARE_PROVIDER_SITE_OTHER): Payer: Medicare HMO | Admitting: Vascular Surgery

## 2018-07-29 ENCOUNTER — Encounter (INDEPENDENT_AMBULATORY_CARE_PROVIDER_SITE_OTHER): Payer: Self-pay | Admitting: Vascular Surgery

## 2018-07-29 VITALS — BP 161/64 | HR 49 | Resp 17 | Ht 73.0 in | Wt 262.0 lb

## 2018-07-29 DIAGNOSIS — Z992 Dependence on renal dialysis: Secondary | ICD-10-CM

## 2018-07-29 DIAGNOSIS — I120 Hypertensive chronic kidney disease with stage 5 chronic kidney disease or end stage renal disease: Secondary | ICD-10-CM | POA: Diagnosis not present

## 2018-07-29 DIAGNOSIS — E1122 Type 2 diabetes mellitus with diabetic chronic kidney disease: Secondary | ICD-10-CM | POA: Diagnosis not present

## 2018-07-29 DIAGNOSIS — I10 Essential (primary) hypertension: Secondary | ICD-10-CM

## 2018-07-29 DIAGNOSIS — N186 End stage renal disease: Secondary | ICD-10-CM | POA: Diagnosis not present

## 2018-07-29 DIAGNOSIS — E785 Hyperlipidemia, unspecified: Secondary | ICD-10-CM
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
## 2018-07-30 DIAGNOSIS — N2581 Secondary hyperparathyroidism of renal origin: Secondary | ICD-10-CM | POA: Diagnosis not present

## 2018-07-30 DIAGNOSIS — Z992 Dependence on renal dialysis: Secondary | ICD-10-CM | POA: Diagnosis not present

## 2018-07-30 DIAGNOSIS — N186 End stage renal disease: Secondary | ICD-10-CM | POA: Diagnosis not present

## 2018-07-30 DIAGNOSIS — Z23 Encounter for immunization: Secondary | ICD-10-CM | POA: Diagnosis not present

## 2018-08-01 DIAGNOSIS — Z23 Encounter for immunization: Secondary | ICD-10-CM | POA: Diagnosis not present

## 2018-08-01 DIAGNOSIS — N186 End stage renal disease: Secondary | ICD-10-CM | POA: Diagnosis not present

## 2018-08-01 DIAGNOSIS — N2581 Secondary hyperparathyroidism of renal origin: Secondary | ICD-10-CM | POA: Diagnosis not present

## 2018-08-01 DIAGNOSIS — Z992 Dependence on renal dialysis: Secondary | ICD-10-CM | POA: Diagnosis not present

## 2018-08-04 DIAGNOSIS — E119 Type 2 diabetes mellitus without complications: Secondary | ICD-10-CM | POA: Diagnosis not present

## 2018-08-04 DIAGNOSIS — Z992 Dependence on renal dialysis: Secondary | ICD-10-CM | POA: Diagnosis not present

## 2018-08-04 DIAGNOSIS — Z23 Encounter for immunization: Secondary | ICD-10-CM | POA: Diagnosis not present

## 2018-08-04 DIAGNOSIS — N186 End stage renal disease: Secondary | ICD-10-CM | POA: Diagnosis not present

## 2018-08-04 DIAGNOSIS — Z794 Long term (current) use of insulin: Secondary | ICD-10-CM | POA: Diagnosis not present

## 2018-08-04 DIAGNOSIS — N2581 Secondary hyperparathyroidism of renal origin: Secondary | ICD-10-CM | POA: Diagnosis not present

## 2018-08-06 DIAGNOSIS — N186 End stage renal disease: Secondary | ICD-10-CM | POA: Diagnosis not present

## 2018-08-06 DIAGNOSIS — Z23 Encounter for immunization: Secondary | ICD-10-CM | POA: Diagnosis not present

## 2018-08-06 DIAGNOSIS — N2581 Secondary hyperparathyroidism of renal origin: Secondary | ICD-10-CM | POA: Diagnosis not present

## 2018-08-06 DIAGNOSIS — Z992 Dependence on renal dialysis: Secondary | ICD-10-CM | POA: Diagnosis not present

## 2018-08-08 DIAGNOSIS — Z23 Encounter for immunization: Secondary | ICD-10-CM | POA: Diagnosis not present

## 2018-08-08 DIAGNOSIS — Z992 Dependence on renal dialysis: Secondary | ICD-10-CM | POA: Diagnosis not present

## 2018-08-08 DIAGNOSIS — N186 End stage renal disease: Secondary | ICD-10-CM | POA: Diagnosis not present

## 2018-08-08 DIAGNOSIS — N2581 Secondary hyperparathyroidism of renal origin: Secondary | ICD-10-CM | POA: Diagnosis not present

## 2018-08-11 DIAGNOSIS — Z23 Encounter for immunization: Secondary | ICD-10-CM | POA: Diagnosis not present

## 2018-08-11 DIAGNOSIS — N186 End stage renal disease: Secondary | ICD-10-CM | POA: Diagnosis not present

## 2018-08-11 DIAGNOSIS — N2581 Secondary hyperparathyroidism of renal origin: Secondary | ICD-10-CM | POA: Diagnosis not present

## 2018-08-11 DIAGNOSIS — Z992 Dependence on renal dialysis: Secondary | ICD-10-CM | POA: Diagnosis not present

## 2018-08-13 DIAGNOSIS — E7800 Pure hypercholesterolemia, unspecified: Secondary | ICD-10-CM | POA: Diagnosis not present

## 2018-08-13 DIAGNOSIS — Z992 Dependence on renal dialysis: Secondary | ICD-10-CM | POA: Diagnosis not present

## 2018-08-13 DIAGNOSIS — Z23 Encounter for immunization: Secondary | ICD-10-CM | POA: Diagnosis not present

## 2018-08-13 DIAGNOSIS — R600 Localized edema: Secondary | ICD-10-CM | POA: Diagnosis not present

## 2018-08-13 DIAGNOSIS — N186 End stage renal disease: Secondary | ICD-10-CM | POA: Diagnosis not present

## 2018-08-13 DIAGNOSIS — N2581 Secondary hyperparathyroidism of renal origin: Secondary | ICD-10-CM | POA: Diagnosis not present

## 2018-08-13 DIAGNOSIS — R0602 Shortness of breath: Secondary | ICD-10-CM | POA: Diagnosis not present

## 2018-08-13 DIAGNOSIS — I10 Essential (primary) hypertension: Secondary | ICD-10-CM | POA: Diagnosis not present

## 2018-08-13 DIAGNOSIS — I5032 Chronic diastolic (congestive) heart failure: Secondary | ICD-10-CM | POA: Diagnosis not present

## 2018-08-13 DIAGNOSIS — R001 Bradycardia, unspecified: Secondary | ICD-10-CM | POA: Diagnosis not present

## 2018-08-15 DIAGNOSIS — N186 End stage renal disease: Secondary | ICD-10-CM | POA: Diagnosis not present

## 2018-08-15 DIAGNOSIS — N2581 Secondary hyperparathyroidism of renal origin: Secondary | ICD-10-CM | POA: Diagnosis not present

## 2018-08-15 DIAGNOSIS — Z23 Encounter for immunization: Secondary | ICD-10-CM | POA: Diagnosis not present

## 2018-08-15 DIAGNOSIS — Z992 Dependence on renal dialysis: Secondary | ICD-10-CM | POA: Diagnosis not present

## 2018-08-18 DIAGNOSIS — N2581 Secondary hyperparathyroidism of renal origin: Secondary | ICD-10-CM | POA: Diagnosis not present

## 2018-08-18 DIAGNOSIS — Z23 Encounter for immunization: Secondary | ICD-10-CM | POA: Diagnosis not present

## 2018-08-18 DIAGNOSIS — N186 End stage renal disease: Secondary | ICD-10-CM | POA: Diagnosis not present

## 2018-08-18 DIAGNOSIS — Z992 Dependence on renal dialysis: Secondary | ICD-10-CM | POA: Diagnosis not present

## 2018-08-19 ENCOUNTER — Other Ambulatory Visit: Payer: Self-pay | Admitting: Internal Medicine

## 2018-08-20 DIAGNOSIS — Z992 Dependence on renal dialysis: Secondary | ICD-10-CM | POA: Diagnosis not present

## 2018-08-20 DIAGNOSIS — Z23 Encounter for immunization: Secondary | ICD-10-CM | POA: Diagnosis not present

## 2018-08-20 DIAGNOSIS — N186 End stage renal disease: Secondary | ICD-10-CM | POA: Diagnosis not present

## 2018-08-20 DIAGNOSIS — N2581 Secondary hyperparathyroidism of renal origin: Secondary | ICD-10-CM | POA: Diagnosis not present

## 2018-08-21 DIAGNOSIS — N186 End stage renal disease: Secondary | ICD-10-CM | POA: Diagnosis not present

## 2018-08-21 DIAGNOSIS — Z992 Dependence on renal dialysis: Secondary | ICD-10-CM | POA: Diagnosis not present

## 2018-08-22 DIAGNOSIS — Z992 Dependence on renal dialysis: Secondary | ICD-10-CM | POA: Diagnosis not present

## 2018-08-22 DIAGNOSIS — N2581 Secondary hyperparathyroidism of renal origin: Secondary | ICD-10-CM | POA: Diagnosis not present

## 2018-08-22 DIAGNOSIS — N186 End stage renal disease: Secondary | ICD-10-CM | POA: Diagnosis not present

## 2018-08-25 DIAGNOSIS — N186 End stage renal disease: Secondary | ICD-10-CM | POA: Diagnosis not present

## 2018-08-25 DIAGNOSIS — Z992 Dependence on renal dialysis: Secondary | ICD-10-CM | POA: Diagnosis not present

## 2018-08-25 DIAGNOSIS — N2581 Secondary hyperparathyroidism of renal origin: Secondary | ICD-10-CM | POA: Diagnosis not present

## 2018-08-26 DIAGNOSIS — Z949 Transplanted organ and tissue status, unspecified: Secondary | ICD-10-CM | POA: Diagnosis not present

## 2018-08-27 DIAGNOSIS — N186 End stage renal disease: Secondary | ICD-10-CM | POA: Diagnosis not present

## 2018-08-27 DIAGNOSIS — N2581 Secondary hyperparathyroidism of renal origin: Secondary | ICD-10-CM | POA: Diagnosis not present

## 2018-08-27 DIAGNOSIS — Z992 Dependence on renal dialysis: Secondary | ICD-10-CM | POA: Diagnosis not present

## 2018-08-29 DIAGNOSIS — N2581 Secondary hyperparathyroidism of renal origin: Secondary | ICD-10-CM | POA: Diagnosis not present

## 2018-08-29 DIAGNOSIS — Z992 Dependence on renal dialysis: Secondary | ICD-10-CM | POA: Diagnosis not present

## 2018-08-29 DIAGNOSIS — N186 End stage renal disease: Secondary | ICD-10-CM | POA: Diagnosis not present

## 2018-09-01 DIAGNOSIS — N2581 Secondary hyperparathyroidism of renal origin: Secondary | ICD-10-CM | POA: Diagnosis not present

## 2018-09-01 DIAGNOSIS — N186 End stage renal disease: Secondary | ICD-10-CM | POA: Diagnosis not present

## 2018-09-01 DIAGNOSIS — Z992 Dependence on renal dialysis: Secondary | ICD-10-CM | POA: Diagnosis not present

## 2018-09-03 DIAGNOSIS — Z992 Dependence on renal dialysis: Secondary | ICD-10-CM | POA: Diagnosis not present

## 2018-09-03 DIAGNOSIS — N186 End stage renal disease: Secondary | ICD-10-CM | POA: Diagnosis not present

## 2018-09-03 DIAGNOSIS — N2581 Secondary hyperparathyroidism of renal origin: Secondary | ICD-10-CM | POA: Diagnosis not present

## 2018-09-05 DIAGNOSIS — N186 End stage renal disease: Secondary | ICD-10-CM | POA: Diagnosis not present

## 2018-09-05 DIAGNOSIS — N2581 Secondary hyperparathyroidism of renal origin: Secondary | ICD-10-CM | POA: Diagnosis not present

## 2018-09-05 DIAGNOSIS — Z992 Dependence on renal dialysis: Secondary | ICD-10-CM | POA: Diagnosis not present

## 2018-09-08 DIAGNOSIS — N186 End stage renal disease: Secondary | ICD-10-CM | POA: Diagnosis not present

## 2018-09-08 DIAGNOSIS — N2581 Secondary hyperparathyroidism of renal origin: Secondary | ICD-10-CM | POA: Diagnosis not present

## 2018-09-08 DIAGNOSIS — Z992 Dependence on renal dialysis: Secondary | ICD-10-CM | POA: Diagnosis not present

## 2018-09-10 DIAGNOSIS — N186 End stage renal disease: Secondary | ICD-10-CM | POA: Diagnosis not present

## 2018-09-10 DIAGNOSIS — Z992 Dependence on renal dialysis: Secondary | ICD-10-CM | POA: Diagnosis not present

## 2018-09-10 DIAGNOSIS — N2581 Secondary hyperparathyroidism of renal origin: Secondary | ICD-10-CM | POA: Diagnosis not present

## 2018-09-12 DIAGNOSIS — Z992 Dependence on renal dialysis: Secondary | ICD-10-CM | POA: Diagnosis not present

## 2018-09-12 DIAGNOSIS — N186 End stage renal disease: Secondary | ICD-10-CM | POA: Diagnosis not present

## 2018-09-12 DIAGNOSIS — N2581 Secondary hyperparathyroidism of renal origin: Secondary | ICD-10-CM | POA: Diagnosis not present

## 2018-09-14 DIAGNOSIS — N186 End stage renal disease: Secondary | ICD-10-CM | POA: Diagnosis not present

## 2018-09-14 DIAGNOSIS — N2581 Secondary hyperparathyroidism of renal origin: Secondary | ICD-10-CM | POA: Diagnosis not present

## 2018-09-14 DIAGNOSIS — Z992 Dependence on renal dialysis: Secondary | ICD-10-CM | POA: Diagnosis not present

## 2018-09-16 DIAGNOSIS — N186 End stage renal disease: Secondary | ICD-10-CM | POA: Diagnosis not present

## 2018-09-16 DIAGNOSIS — Z992 Dependence on renal dialysis: Secondary | ICD-10-CM | POA: Diagnosis not present

## 2018-09-16 DIAGNOSIS — N2581 Secondary hyperparathyroidism of renal origin: Secondary | ICD-10-CM | POA: Diagnosis not present

## 2018-09-19 DIAGNOSIS — N2581 Secondary hyperparathyroidism of renal origin: Secondary | ICD-10-CM | POA: Diagnosis not present

## 2018-09-19 DIAGNOSIS — Z992 Dependence on renal dialysis: Secondary | ICD-10-CM | POA: Diagnosis not present

## 2018-09-19 DIAGNOSIS — N186 End stage renal disease: Secondary | ICD-10-CM | POA: Diagnosis not present

## 2018-09-20 DIAGNOSIS — N186 End stage renal disease: Secondary | ICD-10-CM | POA: Diagnosis not present

## 2018-09-20 DIAGNOSIS — Z992 Dependence on renal dialysis: Secondary | ICD-10-CM | POA: Diagnosis not present

## 2018-09-22 DIAGNOSIS — N186 End stage renal disease: Secondary | ICD-10-CM | POA: Diagnosis not present

## 2018-09-22 DIAGNOSIS — E8779 Other fluid overload: Secondary | ICD-10-CM | POA: Diagnosis not present

## 2018-09-22 DIAGNOSIS — Z992 Dependence on renal dialysis: Secondary | ICD-10-CM | POA: Diagnosis not present

## 2018-09-22 DIAGNOSIS — N2581 Secondary hyperparathyroidism of renal origin: Secondary | ICD-10-CM | POA: Diagnosis not present

## 2018-09-22 MED ORDER — HYDROCORTISONE 2.5 % EX CREA
TOPICAL_CREAM | Freq: Three times a day (TID) | CUTANEOUS | 3 refills | Status: DC | PRN
Start: 2018-09-22 — End: 2019-07-08

## 2018-09-24 DIAGNOSIS — Z992 Dependence on renal dialysis: Secondary | ICD-10-CM | POA: Diagnosis not present

## 2018-09-24 DIAGNOSIS — N2581 Secondary hyperparathyroidism of renal origin: Secondary | ICD-10-CM | POA: Diagnosis not present

## 2018-09-24 DIAGNOSIS — N186 End stage renal disease: Secondary | ICD-10-CM | POA: Diagnosis not present

## 2018-09-24 DIAGNOSIS — E8779 Other fluid overload: Secondary | ICD-10-CM | POA: Diagnosis not present

## 2018-09-26 DIAGNOSIS — Z992 Dependence on renal dialysis: Secondary | ICD-10-CM | POA: Diagnosis not present

## 2018-09-26 DIAGNOSIS — N2581 Secondary hyperparathyroidism of renal origin: Secondary | ICD-10-CM | POA: Diagnosis not present

## 2018-09-26 DIAGNOSIS — E8779 Other fluid overload: Secondary | ICD-10-CM | POA: Diagnosis not present

## 2018-09-26 DIAGNOSIS — N186 End stage renal disease: Secondary | ICD-10-CM | POA: Diagnosis not present

## 2018-09-29 DIAGNOSIS — N186 End stage renal disease: Secondary | ICD-10-CM | POA: Diagnosis not present

## 2018-09-29 DIAGNOSIS — E8779 Other fluid overload: Secondary | ICD-10-CM | POA: Diagnosis not present

## 2018-09-29 DIAGNOSIS — Z992 Dependence on renal dialysis: Secondary | ICD-10-CM | POA: Diagnosis not present

## 2018-09-29 DIAGNOSIS — N2581 Secondary hyperparathyroidism of renal origin: Secondary | ICD-10-CM | POA: Diagnosis not present

## 2018-10-01 ENCOUNTER — Encounter: Payer: Self-pay | Admitting: Internal Medicine

## 2018-10-01 ENCOUNTER — Ambulatory Visit (INDEPENDENT_AMBULATORY_CARE_PROVIDER_SITE_OTHER): Payer: Medicare HMO | Admitting: Internal Medicine

## 2018-10-01 VITALS — BP 128/74 | HR 54 | Temp 98.2°F | Ht 72.5 in | Wt 266.0 lb

## 2018-10-01 DIAGNOSIS — E1122 Type 2 diabetes mellitus with diabetic chronic kidney disease: Secondary | ICD-10-CM

## 2018-10-01 DIAGNOSIS — I5032 Chronic diastolic (congestive) heart failure: Secondary | ICD-10-CM | POA: Diagnosis not present

## 2018-10-01 DIAGNOSIS — S069X0S Unspecified intracranial injury without loss of consciousness, sequela: Secondary | ICD-10-CM | POA: Diagnosis not present

## 2018-10-01 DIAGNOSIS — Z125 Encounter for screening for malignant neoplasm of prostate: Secondary | ICD-10-CM | POA: Diagnosis not present

## 2018-10-01 DIAGNOSIS — F39 Unspecified mood [affective] disorder: Secondary | ICD-10-CM

## 2018-10-01 DIAGNOSIS — Z992 Dependence on renal dialysis: Secondary | ICD-10-CM

## 2018-10-01 DIAGNOSIS — N2581 Secondary hyperparathyroidism of renal origin: Secondary | ICD-10-CM | POA: Diagnosis not present

## 2018-10-01 DIAGNOSIS — M1000 Idiopathic gout, unspecified site: Secondary | ICD-10-CM | POA: Diagnosis not present

## 2018-10-01 DIAGNOSIS — N186 End stage renal disease: Secondary | ICD-10-CM | POA: Diagnosis not present

## 2018-10-01 DIAGNOSIS — E8779 Other fluid overload: Secondary | ICD-10-CM | POA: Diagnosis not present

## 2018-10-01 DIAGNOSIS — Z0000 Encounter for general adult medical examination without abnormal findings: Secondary | ICD-10-CM | POA: Diagnosis not present

## 2018-10-01 DIAGNOSIS — R4689 Other symptoms and signs involving appearance and behavior: Secondary | ICD-10-CM

## 2018-10-01 LAB — LIPID PANEL
Cholesterol: 136 mg/dL (ref 0–200)
HDL: 48.9 mg/dL (ref >39.00)
LDL Cholesterol: 67 mg/dL (ref 0–99)
NONHDL: 87.04
Total CHOL/HDL Ratio: 3
Triglycerides: 100 mg/dL (ref 0.0–149.0)
VLDL: 20 mg/dL (ref 0.0–40.0)

## 2018-10-01 LAB — HEPATIC FUNCTION PANEL
ALK PHOS: 102 U/L (ref 39–117)
ALT: 13 U/L (ref 0–53)
AST: 14 U/L (ref 0–37)
Albumin: 3.9 g/dL (ref 3.5–5.2)
BILIRUBIN DIRECT: 0.1 mg/dL (ref 0.0–0.3)
Total Bilirubin: 0.5 mg/dL (ref 0.2–1.2)
Total Protein: 7.1 g/dL (ref 6.0–8.3)

## 2018-10-01 LAB — PSA, MEDICARE: PSA: 0.56 ng/ml (ref 0.10–4.00)

## 2018-10-01 LAB — HEMOGLOBIN A1C: Hgb A1c MFr Bld: 6.3 % (ref 4.6–6.5)

## 2018-10-01 LAB — URIC ACID: Uric Acid, Serum: 4.6 mg/dL (ref 4.0–7.8)

## 2018-10-01 NOTE — Assessment & Plan Note (Signed)
Doing well now May benefit from chronotropic control

## 2018-10-01 NOTE — Assessment & Plan Note (Signed)
I have personally reviewed the Medicare Annual Wellness questionnaire and have noted 1. The patient's medical and social history 2. Their use of alcohol, tobacco or illicit drugs 3. Their current medications and supplements 4. The patient's functional ability including ADL's, fall risks, home safety risks and hearing or visual             impairment. 5. Diet and physical activities 6. Evidence for depression or mood disorders  The patients weight, height, BMI and visual acuity have been recorded in the chart I have made referrals, counseling and provided education to the patient based review of the above and I have provided the pt with a written personalized care plan for preventive services.  I have provided you with a copy of your personalized plan for preventive services. Please take the time to review along with your updated medication list.  Colon due 2022 Will check PSA after discussion Discussed shingrix---will consider Yearly flu vaccine--had it this year Discussed more exercise

## 2018-10-01 NOTE — Assessment & Plan Note (Signed)
Mind still "cloudy" Disabled due to this

## 2018-10-01 NOTE — Patient Instructions (Signed)
Please stop the glipizide if your A1c is 6.5% or less.

## 2018-10-01 NOTE — Assessment & Plan Note (Signed)
Quiet on allopurinol (rare exacerbation)

## 2018-10-01 NOTE — Assessment & Plan Note (Signed)
Depressed in the past Now dysthymia--controlled on the citalopram
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
## 2018-10-01 NOTE — Assessment & Plan Note (Signed)
Tolerating hemodialysis fairly well

## 2018-10-01 NOTE — Assessment & Plan Note (Signed)
BMI over 35 with ESRD, DM, etc Discussed improved exercise and eating for gradual weight loss

## 2018-10-01 NOTE — Assessment & Plan Note (Signed)
Seems to still have good control If under 6.5%---will stop the glipizide

## 2018-10-01 NOTE — Progress Notes (Signed)
Hearing Screening   Method: Audiometry   125Hz  250Hz  500Hz  1000Hz  2000Hz  3000Hz  4000Hz  6000Hz  8000Hz   Right ear:   25 25 20  25     Left ear:   25 25 25  20     Vision Screening Comments: July 2019

## 2018-10-03 DIAGNOSIS — N2581 Secondary hyperparathyroidism of renal origin: Secondary | ICD-10-CM | POA: Diagnosis not present

## 2018-10-03 DIAGNOSIS — E8779 Other fluid overload: Secondary | ICD-10-CM | POA: Diagnosis not present

## 2018-10-03 DIAGNOSIS — Z992 Dependence on renal dialysis: Secondary | ICD-10-CM | POA: Diagnosis not present

## 2018-10-03 DIAGNOSIS — N186 End stage renal disease: Secondary | ICD-10-CM | POA: Diagnosis not present

## 2018-10-06 DIAGNOSIS — N186 End stage renal disease: Secondary | ICD-10-CM | POA: Diagnosis not present

## 2018-10-06 DIAGNOSIS — Z992 Dependence on renal dialysis: Secondary | ICD-10-CM | POA: Diagnosis not present

## 2018-10-06 DIAGNOSIS — E8779 Other fluid overload: Secondary | ICD-10-CM | POA: Diagnosis not present

## 2018-10-06 DIAGNOSIS — N2581 Secondary hyperparathyroidism of renal origin: Secondary | ICD-10-CM | POA: Diagnosis not present

## 2018-10-07 DIAGNOSIS — E1122 Type 2 diabetes mellitus with diabetic chronic kidney disease: Secondary | ICD-10-CM | POA: Diagnosis not present

## 2018-10-07 DIAGNOSIS — Z949 Transplanted organ and tissue status, unspecified: Secondary | ICD-10-CM | POA: Diagnosis not present

## 2018-10-07 DIAGNOSIS — N186 End stage renal disease: Secondary | ICD-10-CM | POA: Diagnosis not present

## 2018-10-07 DIAGNOSIS — E119 Type 2 diabetes mellitus without complications: Secondary | ICD-10-CM | POA: Diagnosis not present

## 2018-10-07 DIAGNOSIS — R001 Bradycardia, unspecified: Secondary | ICD-10-CM | POA: Diagnosis not present

## 2018-10-07 DIAGNOSIS — D631 Anemia in chronic kidney disease: Secondary | ICD-10-CM | POA: Diagnosis not present

## 2018-10-07 DIAGNOSIS — Z01818 Encounter for other preprocedural examination: Secondary | ICD-10-CM | POA: Diagnosis not present

## 2018-10-07 DIAGNOSIS — I10 Essential (primary) hypertension: Secondary | ICD-10-CM | POA: Diagnosis not present

## 2018-10-07 DIAGNOSIS — I120 Hypertensive chronic kidney disease with stage 5 chronic kidney disease or end stage renal disease: Secondary | ICD-10-CM | POA: Diagnosis not present

## 2018-10-08 DIAGNOSIS — E8779 Other fluid overload: Secondary | ICD-10-CM | POA: Diagnosis not present

## 2018-10-08 DIAGNOSIS — N186 End stage renal disease: Secondary | ICD-10-CM | POA: Diagnosis not present

## 2018-10-08 DIAGNOSIS — Z992 Dependence on renal dialysis: Secondary | ICD-10-CM | POA: Diagnosis not present

## 2018-10-08 DIAGNOSIS — N2581 Secondary hyperparathyroidism of renal origin: Secondary | ICD-10-CM | POA: Diagnosis not present

## 2018-10-10 DIAGNOSIS — E8779 Other fluid overload: Secondary | ICD-10-CM | POA: Diagnosis not present

## 2018-10-10 DIAGNOSIS — N2581 Secondary hyperparathyroidism of renal origin: Secondary | ICD-10-CM | POA: Diagnosis not present

## 2018-10-10 DIAGNOSIS — N186 End stage renal disease: Secondary | ICD-10-CM | POA: Diagnosis not present

## 2018-10-10 DIAGNOSIS — Z992 Dependence on renal dialysis: Secondary | ICD-10-CM | POA: Diagnosis not present

## 2018-10-12 DIAGNOSIS — N2581 Secondary hyperparathyroidism of renal origin: Secondary | ICD-10-CM | POA: Diagnosis not present

## 2018-10-12 DIAGNOSIS — N186 End stage renal disease: Secondary | ICD-10-CM | POA: Diagnosis not present

## 2018-10-12 DIAGNOSIS — E8779 Other fluid overload: Secondary | ICD-10-CM | POA: Diagnosis not present

## 2018-10-12 DIAGNOSIS — Z992 Dependence on renal dialysis: Secondary | ICD-10-CM | POA: Diagnosis not present

## 2018-10-14 DIAGNOSIS — N186 End stage renal disease: Secondary | ICD-10-CM | POA: Diagnosis not present

## 2018-10-14 DIAGNOSIS — Z992 Dependence on renal dialysis: Secondary | ICD-10-CM | POA: Diagnosis not present

## 2018-10-14 DIAGNOSIS — N2581 Secondary hyperparathyroidism of renal origin: Secondary | ICD-10-CM | POA: Diagnosis not present

## 2018-10-14 DIAGNOSIS — E8779 Other fluid overload: Secondary | ICD-10-CM | POA: Diagnosis not present

## 2018-10-17 DIAGNOSIS — N2581 Secondary hyperparathyroidism of renal origin: Secondary | ICD-10-CM | POA: Diagnosis not present

## 2018-10-17 DIAGNOSIS — N186 End stage renal disease: Secondary | ICD-10-CM | POA: Diagnosis not present

## 2018-10-17 DIAGNOSIS — E8779 Other fluid overload: Secondary | ICD-10-CM | POA: Diagnosis not present

## 2018-10-17 DIAGNOSIS — Z992 Dependence on renal dialysis: Secondary | ICD-10-CM | POA: Diagnosis not present

## 2018-10-20 DIAGNOSIS — E8779 Other fluid overload: Secondary | ICD-10-CM | POA: Diagnosis not present

## 2018-10-20 DIAGNOSIS — N186 End stage renal disease: Secondary | ICD-10-CM | POA: Diagnosis not present

## 2018-10-20 DIAGNOSIS — Z992 Dependence on renal dialysis: Secondary | ICD-10-CM | POA: Diagnosis not present

## 2018-10-20 DIAGNOSIS — N2581 Secondary hyperparathyroidism of renal origin: Secondary | ICD-10-CM | POA: Diagnosis not present

## 2018-10-21 DIAGNOSIS — N186 End stage renal disease: Secondary | ICD-10-CM | POA: Diagnosis not present

## 2018-10-21 DIAGNOSIS — Z992 Dependence on renal dialysis: Secondary | ICD-10-CM | POA: Diagnosis not present

## 2018-10-21 DIAGNOSIS — E8779 Other fluid overload: Secondary | ICD-10-CM | POA: Diagnosis not present

## 2018-10-21 DIAGNOSIS — N2581 Secondary hyperparathyroidism of renal origin: Secondary | ICD-10-CM | POA: Diagnosis not present

## 2018-10-22 DIAGNOSIS — N186 End stage renal disease: Secondary | ICD-10-CM | POA: Diagnosis not present

## 2018-10-22 DIAGNOSIS — Z992 Dependence on renal dialysis: Secondary | ICD-10-CM | POA: Diagnosis not present

## 2018-10-22 DIAGNOSIS — N2581 Secondary hyperparathyroidism of renal origin: Secondary | ICD-10-CM | POA: Diagnosis not present

## 2018-10-24 DIAGNOSIS — N186 End stage renal disease: Secondary | ICD-10-CM | POA: Diagnosis not present

## 2018-10-24 DIAGNOSIS — Z992 Dependence on renal dialysis: Secondary | ICD-10-CM | POA: Diagnosis not present

## 2018-10-24 DIAGNOSIS — N2581 Secondary hyperparathyroidism of renal origin: Secondary | ICD-10-CM | POA: Diagnosis not present

## 2018-10-27 DIAGNOSIS — Z992 Dependence on renal dialysis: Secondary | ICD-10-CM | POA: Diagnosis not present

## 2018-10-27 DIAGNOSIS — N186 End stage renal disease: Secondary | ICD-10-CM | POA: Diagnosis not present

## 2018-10-27 DIAGNOSIS — Z7682 Awaiting organ transplant status: Secondary | ICD-10-CM | POA: Diagnosis not present

## 2018-10-27 DIAGNOSIS — N2581 Secondary hyperparathyroidism of renal origin: Secondary | ICD-10-CM | POA: Diagnosis not present

## 2018-10-29 DIAGNOSIS — Z992 Dependence on renal dialysis: Secondary | ICD-10-CM | POA: Diagnosis not present

## 2018-10-29 DIAGNOSIS — N2581 Secondary hyperparathyroidism of renal origin: Secondary | ICD-10-CM | POA: Diagnosis not present

## 2018-10-29 DIAGNOSIS — N186 End stage renal disease: Secondary | ICD-10-CM | POA: Diagnosis not present

## 2018-10-31 DIAGNOSIS — Z992 Dependence on renal dialysis: Secondary | ICD-10-CM | POA: Diagnosis not present

## 2018-10-31 DIAGNOSIS — N2581 Secondary hyperparathyroidism of renal origin: Secondary | ICD-10-CM | POA: Diagnosis not present

## 2018-10-31 DIAGNOSIS — N186 End stage renal disease: Secondary | ICD-10-CM | POA: Diagnosis not present

## 2018-11-03 DIAGNOSIS — N186 End stage renal disease: Secondary | ICD-10-CM | POA: Diagnosis not present

## 2018-11-03 DIAGNOSIS — Z992 Dependence on renal dialysis: Secondary | ICD-10-CM | POA: Diagnosis not present

## 2018-11-03 DIAGNOSIS — N2581 Secondary hyperparathyroidism of renal origin: Secondary | ICD-10-CM | POA: Diagnosis not present

## 2018-11-03 DIAGNOSIS — E119 Type 2 diabetes mellitus without complications: Secondary | ICD-10-CM | POA: Diagnosis not present

## 2018-11-03 DIAGNOSIS — Z794 Long term (current) use of insulin: Secondary | ICD-10-CM | POA: Diagnosis not present

## 2018-11-05 DIAGNOSIS — Z992 Dependence on renal dialysis: Secondary | ICD-10-CM | POA: Diagnosis not present

## 2018-11-05 DIAGNOSIS — N186 End stage renal disease: Secondary | ICD-10-CM | POA: Diagnosis not present

## 2018-11-05 DIAGNOSIS — N2581 Secondary hyperparathyroidism of renal origin: Secondary | ICD-10-CM | POA: Diagnosis not present

## 2018-11-07 DIAGNOSIS — Z992 Dependence on renal dialysis: Secondary | ICD-10-CM | POA: Diagnosis not present

## 2018-11-07 DIAGNOSIS — N2581 Secondary hyperparathyroidism of renal origin: Secondary | ICD-10-CM | POA: Diagnosis not present

## 2018-11-07 DIAGNOSIS — N186 End stage renal disease: Secondary | ICD-10-CM | POA: Diagnosis not present

## 2018-11-10 DIAGNOSIS — Z992 Dependence on renal dialysis: Secondary | ICD-10-CM | POA: Diagnosis not present

## 2018-11-10 DIAGNOSIS — N186 End stage renal disease: Secondary | ICD-10-CM | POA: Diagnosis not present

## 2018-11-10 DIAGNOSIS — N2581 Secondary hyperparathyroidism of renal origin: Secondary | ICD-10-CM | POA: Diagnosis not present

## 2018-11-12 DIAGNOSIS — N2581 Secondary hyperparathyroidism of renal origin: Secondary | ICD-10-CM | POA: Diagnosis not present

## 2018-11-12 DIAGNOSIS — Z992 Dependence on renal dialysis: Secondary | ICD-10-CM | POA: Diagnosis not present

## 2018-11-12 DIAGNOSIS — N186 End stage renal disease: Secondary | ICD-10-CM | POA: Diagnosis not present

## 2018-11-13 DIAGNOSIS — Z949 Transplanted organ and tissue status, unspecified: Secondary | ICD-10-CM | POA: Diagnosis not present

## 2018-11-13 DIAGNOSIS — N186 End stage renal disease: Secondary | ICD-10-CM | POA: Diagnosis not present

## 2018-11-14 DIAGNOSIS — N186 End stage renal disease: Secondary | ICD-10-CM | POA: Diagnosis not present

## 2018-11-14 DIAGNOSIS — N2581 Secondary hyperparathyroidism of renal origin: Secondary | ICD-10-CM | POA: Diagnosis not present

## 2018-11-14 DIAGNOSIS — Z992 Dependence on renal dialysis: Secondary | ICD-10-CM | POA: Diagnosis not present

## 2018-11-17 DIAGNOSIS — N186 End stage renal disease: Secondary | ICD-10-CM | POA: Diagnosis not present

## 2018-11-17 DIAGNOSIS — N2581 Secondary hyperparathyroidism of renal origin: Secondary | ICD-10-CM | POA: Diagnosis not present

## 2018-11-17 DIAGNOSIS — Z992 Dependence on renal dialysis: Secondary | ICD-10-CM | POA: Diagnosis not present

## 2018-11-19 DIAGNOSIS — Z992 Dependence on renal dialysis: Secondary | ICD-10-CM | POA: Diagnosis not present

## 2018-11-19 DIAGNOSIS — N186 End stage renal disease: Secondary | ICD-10-CM | POA: Diagnosis not present

## 2018-11-19 DIAGNOSIS — N2581 Secondary hyperparathyroidism of renal origin: Secondary | ICD-10-CM | POA: Diagnosis not present

## 2018-11-21 DIAGNOSIS — N2581 Secondary hyperparathyroidism of renal origin: Secondary | ICD-10-CM | POA: Diagnosis not present

## 2018-11-21 DIAGNOSIS — Z992 Dependence on renal dialysis: Secondary | ICD-10-CM | POA: Diagnosis not present

## 2018-11-21 DIAGNOSIS — N186 End stage renal disease: Secondary | ICD-10-CM | POA: Diagnosis not present

## 2018-11-24 DIAGNOSIS — Z992 Dependence on renal dialysis: Secondary | ICD-10-CM | POA: Diagnosis not present

## 2018-11-24 DIAGNOSIS — N186 End stage renal disease: Secondary | ICD-10-CM | POA: Diagnosis not present

## 2018-11-24 DIAGNOSIS — N2581 Secondary hyperparathyroidism of renal origin: Secondary | ICD-10-CM | POA: Diagnosis not present

## 2018-11-26 DIAGNOSIS — Z992 Dependence on renal dialysis: Secondary | ICD-10-CM | POA: Diagnosis not present

## 2018-11-26 DIAGNOSIS — N2581 Secondary hyperparathyroidism of renal origin: Secondary | ICD-10-CM | POA: Diagnosis not present

## 2018-11-26 DIAGNOSIS — N186 End stage renal disease: Secondary | ICD-10-CM | POA: Diagnosis not present

## 2018-11-28 DIAGNOSIS — N2581 Secondary hyperparathyroidism of renal origin: Secondary | ICD-10-CM | POA: Diagnosis not present

## 2018-11-28 DIAGNOSIS — N186 End stage renal disease: Secondary | ICD-10-CM | POA: Diagnosis not present

## 2018-11-28 DIAGNOSIS — Z992 Dependence on renal dialysis: Secondary | ICD-10-CM | POA: Diagnosis not present

## 2018-12-01 DIAGNOSIS — N2581 Secondary hyperparathyroidism of renal origin: Secondary | ICD-10-CM | POA: Diagnosis not present

## 2018-12-01 DIAGNOSIS — N186 End stage renal disease: Secondary | ICD-10-CM | POA: Diagnosis not present

## 2018-12-01 DIAGNOSIS — Z992 Dependence on renal dialysis: Secondary | ICD-10-CM | POA: Diagnosis not present

## 2018-12-02 ENCOUNTER — Other Ambulatory Visit: Payer: Self-pay | Admitting: Internal Medicine

## 2018-12-03 DIAGNOSIS — Z992 Dependence on renal dialysis: Secondary | ICD-10-CM | POA: Diagnosis not present

## 2018-12-03 DIAGNOSIS — N2581 Secondary hyperparathyroidism of renal origin: Secondary | ICD-10-CM | POA: Diagnosis not present

## 2018-12-03 DIAGNOSIS — N186 End stage renal disease: Secondary | ICD-10-CM | POA: Diagnosis not present

## 2018-12-04 DIAGNOSIS — N186 End stage renal disease: Secondary | ICD-10-CM | POA: Diagnosis not present

## 2018-12-04 DIAGNOSIS — Z992 Dependence on renal dialysis: Secondary | ICD-10-CM | POA: Diagnosis not present

## 2018-12-04 DIAGNOSIS — N2581 Secondary hyperparathyroidism of renal origin: Secondary | ICD-10-CM | POA: Diagnosis not present

## 2018-12-05 DIAGNOSIS — E119 Type 2 diabetes mellitus without complications: Secondary | ICD-10-CM | POA: Diagnosis not present

## 2018-12-05 DIAGNOSIS — Z949 Transplanted organ and tissue status, unspecified: Secondary | ICD-10-CM | POA: Diagnosis not present

## 2018-12-05 DIAGNOSIS — I10 Essential (primary) hypertension: Secondary | ICD-10-CM | POA: Diagnosis not present

## 2018-12-05 DIAGNOSIS — Z01818 Encounter for other preprocedural examination: Secondary | ICD-10-CM | POA: Diagnosis not present

## 2018-12-08 DIAGNOSIS — Z992 Dependence on renal dialysis: Secondary | ICD-10-CM | POA: Diagnosis not present

## 2018-12-08 DIAGNOSIS — N186 End stage renal disease: Secondary | ICD-10-CM | POA: Diagnosis not present

## 2018-12-08 DIAGNOSIS — N2581 Secondary hyperparathyroidism of renal origin: Secondary | ICD-10-CM | POA: Diagnosis not present

## 2018-12-10 DIAGNOSIS — Z992 Dependence on renal dialysis: Secondary | ICD-10-CM | POA: Diagnosis not present

## 2018-12-10 DIAGNOSIS — N186 End stage renal disease: Secondary | ICD-10-CM | POA: Diagnosis not present

## 2018-12-10 DIAGNOSIS — N2581 Secondary hyperparathyroidism of renal origin: Secondary | ICD-10-CM | POA: Diagnosis not present

## 2018-12-12 DIAGNOSIS — N2581 Secondary hyperparathyroidism of renal origin: Secondary | ICD-10-CM | POA: Diagnosis not present

## 2018-12-12 DIAGNOSIS — N186 End stage renal disease: Secondary | ICD-10-CM | POA: Diagnosis not present

## 2018-12-12 DIAGNOSIS — Z992 Dependence on renal dialysis: Secondary | ICD-10-CM | POA: Diagnosis not present

## 2018-12-15 DIAGNOSIS — N2581 Secondary hyperparathyroidism of renal origin: Secondary | ICD-10-CM | POA: Diagnosis not present

## 2018-12-15 DIAGNOSIS — N186 End stage renal disease: Secondary | ICD-10-CM | POA: Diagnosis not present

## 2018-12-15 DIAGNOSIS — Z992 Dependence on renal dialysis: Secondary | ICD-10-CM | POA: Diagnosis not present

## 2018-12-17 DIAGNOSIS — N2581 Secondary hyperparathyroidism of renal origin: Secondary | ICD-10-CM | POA: Diagnosis not present

## 2018-12-17 DIAGNOSIS — N186 End stage renal disease: Secondary | ICD-10-CM | POA: Diagnosis not present

## 2018-12-17 DIAGNOSIS — Z992 Dependence on renal dialysis: Secondary | ICD-10-CM | POA: Diagnosis not present

## 2018-12-19 DIAGNOSIS — Z992 Dependence on renal dialysis: Secondary | ICD-10-CM | POA: Diagnosis not present

## 2018-12-19 DIAGNOSIS — N2581 Secondary hyperparathyroidism of renal origin: Secondary | ICD-10-CM | POA: Diagnosis not present

## 2018-12-19 DIAGNOSIS — N186 End stage renal disease: Secondary | ICD-10-CM | POA: Diagnosis not present

## 2018-12-20 DIAGNOSIS — Z992 Dependence on renal dialysis: Secondary | ICD-10-CM | POA: Diagnosis not present

## 2018-12-20 DIAGNOSIS — N186 End stage renal disease: Secondary | ICD-10-CM | POA: Diagnosis not present

## 2018-12-22 DIAGNOSIS — N186 End stage renal disease: Secondary | ICD-10-CM | POA: Diagnosis not present

## 2018-12-22 DIAGNOSIS — N2581 Secondary hyperparathyroidism of renal origin: Secondary | ICD-10-CM | POA: Diagnosis not present

## 2018-12-22 DIAGNOSIS — Z992 Dependence on renal dialysis: Secondary | ICD-10-CM | POA: Diagnosis not present

## 2018-12-24 DIAGNOSIS — Z992 Dependence on renal dialysis: Secondary | ICD-10-CM | POA: Diagnosis not present

## 2018-12-24 DIAGNOSIS — N2581 Secondary hyperparathyroidism of renal origin: Secondary | ICD-10-CM | POA: Diagnosis not present

## 2018-12-24 DIAGNOSIS — N186 End stage renal disease: Secondary | ICD-10-CM | POA: Diagnosis not present

## 2018-12-26 DIAGNOSIS — Z992 Dependence on renal dialysis: Secondary | ICD-10-CM | POA: Diagnosis not present

## 2018-12-26 DIAGNOSIS — N186 End stage renal disease: Secondary | ICD-10-CM | POA: Diagnosis not present

## 2018-12-26 DIAGNOSIS — N2581 Secondary hyperparathyroidism of renal origin: Secondary | ICD-10-CM | POA: Diagnosis not present

## 2018-12-29 DIAGNOSIS — Z992 Dependence on renal dialysis: Secondary | ICD-10-CM | POA: Diagnosis not present

## 2018-12-29 DIAGNOSIS — N2581 Secondary hyperparathyroidism of renal origin: Secondary | ICD-10-CM | POA: Diagnosis not present

## 2018-12-29 DIAGNOSIS — N186 End stage renal disease: Secondary | ICD-10-CM | POA: Diagnosis not present

## 2018-12-31 DIAGNOSIS — N186 End stage renal disease: Secondary | ICD-10-CM | POA: Diagnosis not present

## 2018-12-31 DIAGNOSIS — N2581 Secondary hyperparathyroidism of renal origin: Secondary | ICD-10-CM | POA: Diagnosis not present

## 2018-12-31 DIAGNOSIS — Z992 Dependence on renal dialysis: Secondary | ICD-10-CM | POA: Diagnosis not present

## 2019-01-02 DIAGNOSIS — N186 End stage renal disease: Secondary | ICD-10-CM | POA: Diagnosis not present

## 2019-01-02 DIAGNOSIS — Z992 Dependence on renal dialysis: Secondary | ICD-10-CM | POA: Diagnosis not present

## 2019-01-02 DIAGNOSIS — N2581 Secondary hyperparathyroidism of renal origin: Secondary | ICD-10-CM | POA: Diagnosis not present

## 2019-01-05 DIAGNOSIS — N186 End stage renal disease: Secondary | ICD-10-CM | POA: Diagnosis not present

## 2019-01-05 DIAGNOSIS — N2581 Secondary hyperparathyroidism of renal origin: Secondary | ICD-10-CM | POA: Diagnosis not present

## 2019-01-05 DIAGNOSIS — Z992 Dependence on renal dialysis: Secondary | ICD-10-CM | POA: Diagnosis not present

## 2019-01-08 DIAGNOSIS — N2581 Secondary hyperparathyroidism of renal origin: Secondary | ICD-10-CM | POA: Diagnosis not present

## 2019-01-08 DIAGNOSIS — I871 Compression of vein: Secondary | ICD-10-CM | POA: Diagnosis not present

## 2019-01-08 DIAGNOSIS — N186 End stage renal disease: Secondary | ICD-10-CM | POA: Diagnosis not present

## 2019-01-08 DIAGNOSIS — T82868A Thrombosis of vascular prosthetic devices, implants and grafts, initial encounter: Secondary | ICD-10-CM | POA: Diagnosis not present

## 2019-01-08 DIAGNOSIS — Z992 Dependence on renal dialysis: Secondary | ICD-10-CM | POA: Diagnosis not present

## 2019-01-09 DIAGNOSIS — N2581 Secondary hyperparathyroidism of renal origin: Secondary | ICD-10-CM | POA: Diagnosis not present

## 2019-01-09 DIAGNOSIS — N186 End stage renal disease: Secondary | ICD-10-CM | POA: Diagnosis not present

## 2019-01-09 DIAGNOSIS — Z992 Dependence on renal dialysis: Secondary | ICD-10-CM | POA: Diagnosis not present

## 2019-01-12 ENCOUNTER — Other Ambulatory Visit: Payer: Self-pay | Admitting: Internal Medicine

## 2019-01-13 DIAGNOSIS — T82868A Thrombosis of vascular prosthetic devices, implants and grafts, initial encounter: Secondary | ICD-10-CM | POA: Diagnosis not present

## 2019-01-13 DIAGNOSIS — N2581 Secondary hyperparathyroidism of renal origin: Secondary | ICD-10-CM | POA: Diagnosis not present

## 2019-01-13 DIAGNOSIS — Z992 Dependence on renal dialysis: Secondary | ICD-10-CM | POA: Diagnosis not present

## 2019-01-13 DIAGNOSIS — T82858A Stenosis of vascular prosthetic devices, implants and grafts, initial encounter: Secondary | ICD-10-CM | POA: Diagnosis not present

## 2019-01-13 DIAGNOSIS — N186 End stage renal disease: Secondary | ICD-10-CM | POA: Diagnosis not present

## 2019-01-14 DIAGNOSIS — N186 End stage renal disease: Secondary | ICD-10-CM | POA: Diagnosis not present

## 2019-01-14 DIAGNOSIS — Z992 Dependence on renal dialysis: Secondary | ICD-10-CM | POA: Diagnosis not present

## 2019-01-14 DIAGNOSIS — N2581 Secondary hyperparathyroidism of renal origin: Secondary | ICD-10-CM | POA: Diagnosis not present

## 2019-01-16 DIAGNOSIS — N186 End stage renal disease: Secondary | ICD-10-CM | POA: Diagnosis not present

## 2019-01-16 DIAGNOSIS — Z992 Dependence on renal dialysis: Secondary | ICD-10-CM | POA: Diagnosis not present

## 2019-01-16 DIAGNOSIS — N2581 Secondary hyperparathyroidism of renal origin: Secondary | ICD-10-CM | POA: Diagnosis not present

## 2019-01-19 DIAGNOSIS — N186 End stage renal disease: Secondary | ICD-10-CM | POA: Diagnosis not present

## 2019-01-19 DIAGNOSIS — N2581 Secondary hyperparathyroidism of renal origin: Secondary | ICD-10-CM | POA: Diagnosis not present

## 2019-01-19 DIAGNOSIS — Z992 Dependence on renal dialysis: Secondary | ICD-10-CM | POA: Diagnosis not present

## 2019-01-20 DIAGNOSIS — Z992 Dependence on renal dialysis: Secondary | ICD-10-CM | POA: Diagnosis not present

## 2019-01-20 DIAGNOSIS — N186 End stage renal disease: Secondary | ICD-10-CM | POA: Diagnosis not present

## 2019-01-21 DIAGNOSIS — Z992 Dependence on renal dialysis: Secondary | ICD-10-CM | POA: Diagnosis not present

## 2019-01-21 DIAGNOSIS — N186 End stage renal disease: Secondary | ICD-10-CM | POA: Diagnosis not present

## 2019-01-21 DIAGNOSIS — N2581 Secondary hyperparathyroidism of renal origin: Secondary | ICD-10-CM | POA: Diagnosis not present

## 2019-01-23 DIAGNOSIS — N2581 Secondary hyperparathyroidism of renal origin: Secondary | ICD-10-CM | POA: Diagnosis not present

## 2019-01-23 DIAGNOSIS — N186 End stage renal disease: Secondary | ICD-10-CM | POA: Diagnosis not present

## 2019-01-23 DIAGNOSIS — Z992 Dependence on renal dialysis: Secondary | ICD-10-CM | POA: Diagnosis not present

## 2019-01-26 DIAGNOSIS — N186 End stage renal disease: Secondary | ICD-10-CM | POA: Diagnosis not present

## 2019-01-26 DIAGNOSIS — Z992 Dependence on renal dialysis: Secondary | ICD-10-CM | POA: Diagnosis not present

## 2019-01-26 DIAGNOSIS — N2581 Secondary hyperparathyroidism of renal origin: Secondary | ICD-10-CM | POA: Diagnosis not present

## 2019-01-28 DIAGNOSIS — N2581 Secondary hyperparathyroidism of renal origin: Secondary | ICD-10-CM | POA: Diagnosis not present

## 2019-01-28 DIAGNOSIS — N186 End stage renal disease: Secondary | ICD-10-CM | POA: Diagnosis not present

## 2019-01-28 DIAGNOSIS — Z992 Dependence on renal dialysis: Secondary | ICD-10-CM | POA: Diagnosis not present

## 2019-01-30 DIAGNOSIS — N186 End stage renal disease: Secondary | ICD-10-CM | POA: Diagnosis not present

## 2019-01-30 DIAGNOSIS — Z992 Dependence on renal dialysis: Secondary | ICD-10-CM | POA: Diagnosis not present

## 2019-01-30 DIAGNOSIS — N2581 Secondary hyperparathyroidism of renal origin: Secondary | ICD-10-CM | POA: Diagnosis not present

## 2019-02-02 DIAGNOSIS — N2581 Secondary hyperparathyroidism of renal origin: Secondary | ICD-10-CM | POA: Diagnosis not present

## 2019-02-02 DIAGNOSIS — E119 Type 2 diabetes mellitus without complications: Secondary | ICD-10-CM | POA: Diagnosis not present

## 2019-02-02 DIAGNOSIS — Z794 Long term (current) use of insulin: Secondary | ICD-10-CM | POA: Diagnosis not present

## 2019-02-02 DIAGNOSIS — Z992 Dependence on renal dialysis: Secondary | ICD-10-CM | POA: Diagnosis not present

## 2019-02-02 DIAGNOSIS — N186 End stage renal disease: Secondary | ICD-10-CM | POA: Diagnosis not present

## 2019-02-04 DIAGNOSIS — Z992 Dependence on renal dialysis: Secondary | ICD-10-CM | POA: Diagnosis not present

## 2019-02-04 DIAGNOSIS — N2581 Secondary hyperparathyroidism of renal origin: Secondary | ICD-10-CM | POA: Diagnosis not present

## 2019-02-04 DIAGNOSIS — N186 End stage renal disease: Secondary | ICD-10-CM | POA: Diagnosis not present

## 2019-02-06 DIAGNOSIS — N186 End stage renal disease: Secondary | ICD-10-CM | POA: Diagnosis not present

## 2019-02-06 DIAGNOSIS — Z992 Dependence on renal dialysis: Secondary | ICD-10-CM | POA: Diagnosis not present

## 2019-02-06 DIAGNOSIS — N2581 Secondary hyperparathyroidism of renal origin: Secondary | ICD-10-CM | POA: Diagnosis not present

## 2019-02-09 DIAGNOSIS — N2581 Secondary hyperparathyroidism of renal origin: Secondary | ICD-10-CM | POA: Diagnosis not present

## 2019-02-09 DIAGNOSIS — Z992 Dependence on renal dialysis: Secondary | ICD-10-CM | POA: Diagnosis not present

## 2019-02-09 DIAGNOSIS — N186 End stage renal disease: Secondary | ICD-10-CM | POA: Diagnosis not present

## 2019-02-11 DIAGNOSIS — Z992 Dependence on renal dialysis: Secondary | ICD-10-CM | POA: Diagnosis not present

## 2019-02-11 DIAGNOSIS — I5032 Chronic diastolic (congestive) heart failure: Secondary | ICD-10-CM | POA: Diagnosis not present

## 2019-02-11 DIAGNOSIS — N186 End stage renal disease: Secondary | ICD-10-CM | POA: Diagnosis not present

## 2019-02-11 DIAGNOSIS — I10 Essential (primary) hypertension: Secondary | ICD-10-CM | POA: Diagnosis not present

## 2019-02-11 DIAGNOSIS — E7800 Pure hypercholesterolemia, unspecified: Secondary | ICD-10-CM | POA: Diagnosis not present

## 2019-02-11 DIAGNOSIS — R0602 Shortness of breath: Secondary | ICD-10-CM | POA: Diagnosis not present

## 2019-02-11 DIAGNOSIS — R001 Bradycardia, unspecified: Secondary | ICD-10-CM | POA: Diagnosis not present

## 2019-02-11 DIAGNOSIS — N2581 Secondary hyperparathyroidism of renal origin: Secondary | ICD-10-CM | POA: Diagnosis not present

## 2019-02-13 DIAGNOSIS — Z992 Dependence on renal dialysis: Secondary | ICD-10-CM | POA: Diagnosis not present

## 2019-02-13 DIAGNOSIS — N2581 Secondary hyperparathyroidism of renal origin: Secondary | ICD-10-CM | POA: Diagnosis not present

## 2019-02-13 DIAGNOSIS — N186 End stage renal disease: Secondary | ICD-10-CM | POA: Diagnosis not present

## 2019-02-16 DIAGNOSIS — N186 End stage renal disease: Secondary | ICD-10-CM | POA: Diagnosis not present

## 2019-02-16 DIAGNOSIS — Z992 Dependence on renal dialysis: Secondary | ICD-10-CM | POA: Diagnosis not present

## 2019-02-16 DIAGNOSIS — N2581 Secondary hyperparathyroidism of renal origin: Secondary | ICD-10-CM | POA: Diagnosis not present

## 2019-02-18 DIAGNOSIS — N186 End stage renal disease: Secondary | ICD-10-CM | POA: Diagnosis not present

## 2019-02-18 DIAGNOSIS — N2581 Secondary hyperparathyroidism of renal origin: Secondary | ICD-10-CM | POA: Diagnosis not present

## 2019-02-18 DIAGNOSIS — Z992 Dependence on renal dialysis: Secondary | ICD-10-CM | POA: Diagnosis not present

## 2019-02-19 DIAGNOSIS — N186 End stage renal disease: Secondary | ICD-10-CM | POA: Diagnosis not present

## 2019-02-19 DIAGNOSIS — Z992 Dependence on renal dialysis: Secondary | ICD-10-CM | POA: Diagnosis not present

## 2019-02-20 DIAGNOSIS — Z992 Dependence on renal dialysis: Secondary | ICD-10-CM | POA: Diagnosis not present

## 2019-02-20 DIAGNOSIS — N2581 Secondary hyperparathyroidism of renal origin: Secondary | ICD-10-CM | POA: Diagnosis not present

## 2019-02-20 DIAGNOSIS — N186 End stage renal disease: Secondary | ICD-10-CM | POA: Diagnosis not present

## 2019-02-23 DIAGNOSIS — Z992 Dependence on renal dialysis: Secondary | ICD-10-CM | POA: Diagnosis not present

## 2019-02-23 DIAGNOSIS — Z7682 Awaiting organ transplant status: Secondary | ICD-10-CM | POA: Diagnosis not present

## 2019-02-23 DIAGNOSIS — N2581 Secondary hyperparathyroidism of renal origin: Secondary | ICD-10-CM | POA: Diagnosis not present

## 2019-02-23 DIAGNOSIS — N186 End stage renal disease: Secondary | ICD-10-CM | POA: Diagnosis not present

## 2019-02-25 DIAGNOSIS — N186 End stage renal disease: Secondary | ICD-10-CM | POA: Diagnosis not present

## 2019-02-25 DIAGNOSIS — N2581 Secondary hyperparathyroidism of renal origin: Secondary | ICD-10-CM | POA: Diagnosis not present

## 2019-02-25 DIAGNOSIS — Z992 Dependence on renal dialysis: Secondary | ICD-10-CM | POA: Diagnosis not present

## 2019-02-27 ENCOUNTER — Other Ambulatory Visit: Payer: Self-pay | Admitting: Urology

## 2019-02-27 ENCOUNTER — Other Ambulatory Visit: Payer: Self-pay | Admitting: Internal Medicine

## 2019-02-27 DIAGNOSIS — Z992 Dependence on renal dialysis: Secondary | ICD-10-CM | POA: Diagnosis not present

## 2019-02-27 DIAGNOSIS — N401 Enlarged prostate with lower urinary tract symptoms: Secondary | ICD-10-CM

## 2019-02-27 DIAGNOSIS — N186 End stage renal disease: Secondary | ICD-10-CM | POA: Diagnosis not present

## 2019-02-27 DIAGNOSIS — N2581 Secondary hyperparathyroidism of renal origin: Secondary | ICD-10-CM | POA: Diagnosis not present

## 2019-03-02 DIAGNOSIS — N186 End stage renal disease: Secondary | ICD-10-CM | POA: Diagnosis not present

## 2019-03-02 DIAGNOSIS — N2581 Secondary hyperparathyroidism of renal origin: Secondary | ICD-10-CM | POA: Diagnosis not present

## 2019-03-02 DIAGNOSIS — Z992 Dependence on renal dialysis: Secondary | ICD-10-CM | POA: Diagnosis not present

## 2019-03-04 DIAGNOSIS — Z992 Dependence on renal dialysis: Secondary | ICD-10-CM | POA: Diagnosis not present

## 2019-03-04 DIAGNOSIS — N2581 Secondary hyperparathyroidism of renal origin: Secondary | ICD-10-CM | POA: Diagnosis not present

## 2019-03-04 DIAGNOSIS — N186 End stage renal disease: Secondary | ICD-10-CM | POA: Diagnosis not present

## 2019-03-06 DIAGNOSIS — N186 End stage renal disease: Secondary | ICD-10-CM | POA: Diagnosis not present

## 2019-03-06 DIAGNOSIS — N2581 Secondary hyperparathyroidism of renal origin: Secondary | ICD-10-CM | POA: Diagnosis not present

## 2019-03-06 DIAGNOSIS — Z992 Dependence on renal dialysis: Secondary | ICD-10-CM | POA: Diagnosis not present

## 2019-03-09 DIAGNOSIS — Z992 Dependence on renal dialysis: Secondary | ICD-10-CM | POA: Diagnosis not present

## 2019-03-09 DIAGNOSIS — N2581 Secondary hyperparathyroidism of renal origin: Secondary | ICD-10-CM | POA: Diagnosis not present

## 2019-03-09 DIAGNOSIS — N186 End stage renal disease: Secondary | ICD-10-CM | POA: Diagnosis not present

## 2019-03-11 DIAGNOSIS — N186 End stage renal disease: Secondary | ICD-10-CM | POA: Diagnosis not present

## 2019-03-11 DIAGNOSIS — Z992 Dependence on renal dialysis: Secondary | ICD-10-CM | POA: Diagnosis not present

## 2019-03-11 DIAGNOSIS — N2581 Secondary hyperparathyroidism of renal origin: Secondary | ICD-10-CM | POA: Diagnosis not present

## 2019-03-13 DIAGNOSIS — Z992 Dependence on renal dialysis: Secondary | ICD-10-CM | POA: Diagnosis not present

## 2019-03-13 DIAGNOSIS — N186 End stage renal disease: Secondary | ICD-10-CM | POA: Diagnosis not present

## 2019-03-13 DIAGNOSIS — N2581 Secondary hyperparathyroidism of renal origin: Secondary | ICD-10-CM | POA: Diagnosis not present

## 2019-03-16 DIAGNOSIS — N2581 Secondary hyperparathyroidism of renal origin: Secondary | ICD-10-CM | POA: Diagnosis not present

## 2019-03-16 DIAGNOSIS — N186 End stage renal disease: Secondary | ICD-10-CM | POA: Diagnosis not present

## 2019-03-16 DIAGNOSIS — Z992 Dependence on renal dialysis: Secondary | ICD-10-CM | POA: Diagnosis not present

## 2019-03-18 DIAGNOSIS — Z992 Dependence on renal dialysis: Secondary | ICD-10-CM | POA: Diagnosis not present

## 2019-03-18 DIAGNOSIS — N186 End stage renal disease: Secondary | ICD-10-CM | POA: Diagnosis not present

## 2019-03-18 DIAGNOSIS — N2581 Secondary hyperparathyroidism of renal origin: Secondary | ICD-10-CM | POA: Diagnosis not present

## 2019-03-20 DIAGNOSIS — N2581 Secondary hyperparathyroidism of renal origin: Secondary | ICD-10-CM | POA: Diagnosis not present

## 2019-03-20 DIAGNOSIS — N186 End stage renal disease: Secondary | ICD-10-CM | POA: Diagnosis not present

## 2019-03-20 DIAGNOSIS — Z992 Dependence on renal dialysis: Secondary | ICD-10-CM | POA: Diagnosis not present

## 2019-03-22 DIAGNOSIS — Z992 Dependence on renal dialysis: Secondary | ICD-10-CM | POA: Diagnosis not present

## 2019-03-22 DIAGNOSIS — N186 End stage renal disease: Secondary | ICD-10-CM | POA: Diagnosis not present

## 2019-03-23 DIAGNOSIS — N186 End stage renal disease: Secondary | ICD-10-CM | POA: Diagnosis not present

## 2019-03-23 DIAGNOSIS — Z992 Dependence on renal dialysis: Secondary | ICD-10-CM | POA: Diagnosis not present

## 2019-03-23 DIAGNOSIS — N2581 Secondary hyperparathyroidism of renal origin: Secondary | ICD-10-CM | POA: Diagnosis not present

## 2019-03-25 DIAGNOSIS — N186 End stage renal disease: Secondary | ICD-10-CM | POA: Diagnosis not present

## 2019-03-25 DIAGNOSIS — Z992 Dependence on renal dialysis: Secondary | ICD-10-CM | POA: Diagnosis not present

## 2019-03-25 DIAGNOSIS — N2581 Secondary hyperparathyroidism of renal origin: Secondary | ICD-10-CM | POA: Diagnosis not present

## 2019-03-27 DIAGNOSIS — N2581 Secondary hyperparathyroidism of renal origin: Secondary | ICD-10-CM | POA: Diagnosis not present

## 2019-03-27 DIAGNOSIS — N186 End stage renal disease: Secondary | ICD-10-CM | POA: Diagnosis not present

## 2019-03-27 DIAGNOSIS — Z992 Dependence on renal dialysis: Secondary | ICD-10-CM | POA: Diagnosis not present

## 2019-03-30 DIAGNOSIS — Z992 Dependence on renal dialysis: Secondary | ICD-10-CM | POA: Diagnosis not present

## 2019-03-30 DIAGNOSIS — N2581 Secondary hyperparathyroidism of renal origin: Secondary | ICD-10-CM | POA: Diagnosis not present

## 2019-03-30 DIAGNOSIS — N186 End stage renal disease: Secondary | ICD-10-CM | POA: Diagnosis not present

## 2019-03-31 DIAGNOSIS — N186 End stage renal disease: Secondary | ICD-10-CM | POA: Diagnosis not present

## 2019-03-31 DIAGNOSIS — N2581 Secondary hyperparathyroidism of renal origin: Secondary | ICD-10-CM | POA: Diagnosis not present

## 2019-03-31 DIAGNOSIS — Z992 Dependence on renal dialysis: Secondary | ICD-10-CM | POA: Diagnosis not present

## 2019-04-01 ENCOUNTER — Ambulatory Visit (INDEPENDENT_AMBULATORY_CARE_PROVIDER_SITE_OTHER): Payer: Medicare HMO | Admitting: Internal Medicine

## 2019-04-01 ENCOUNTER — Encounter: Payer: Self-pay | Admitting: Internal Medicine

## 2019-04-01 ENCOUNTER — Other Ambulatory Visit (INDEPENDENT_AMBULATORY_CARE_PROVIDER_SITE_OTHER): Payer: Medicare HMO

## 2019-04-01 VITALS — Temp 97.3°F | Wt 262.3 lb

## 2019-04-01 DIAGNOSIS — S069X0S Unspecified intracranial injury without loss of consciousness, sequela: Secondary | ICD-10-CM | POA: Diagnosis not present

## 2019-04-01 DIAGNOSIS — F39 Unspecified mood [affective] disorder: Secondary | ICD-10-CM | POA: Diagnosis not present

## 2019-04-01 DIAGNOSIS — I5032 Chronic diastolic (congestive) heart failure: Secondary | ICD-10-CM

## 2019-04-01 DIAGNOSIS — E1122 Type 2 diabetes mellitus with diabetic chronic kidney disease: Secondary | ICD-10-CM

## 2019-04-01 DIAGNOSIS — N186 End stage renal disease: Secondary | ICD-10-CM

## 2019-04-01 DIAGNOSIS — Z992 Dependence on renal dialysis: Secondary | ICD-10-CM

## 2019-04-01 LAB — POCT GLYCOSYLATED HEMOGLOBIN (HGB A1C): Hemoglobin A1C: 7.1 % — AB (ref 4.0–5.6)

## 2019-04-01 NOTE — Assessment & Plan Note (Addendum)
Seems to be compensated Weight checked regularly at dialysis No symptoms

## 2019-04-01 NOTE — Assessment & Plan Note (Signed)
Tolerating the hemodialysis On transplant list

## 2019-04-01 NOTE — Assessment & Plan Note (Signed)
Still with mild cognitive issues but no progression and does okay (hasn't been working since [employer] though)
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
## 2019-04-01 NOTE — Assessment & Plan Note (Signed)
Seems to still have good control off the medications Will check A1c

## 2019-04-03 DIAGNOSIS — N2581 Secondary hyperparathyroidism of renal origin: Secondary | ICD-10-CM | POA: Diagnosis not present

## 2019-04-03 DIAGNOSIS — N186 End stage renal disease: Secondary | ICD-10-CM | POA: Diagnosis not present

## 2019-04-03 DIAGNOSIS — Z992 Dependence on renal dialysis: Secondary | ICD-10-CM | POA: Diagnosis not present

## 2019-04-06 DIAGNOSIS — N186 End stage renal disease: Secondary | ICD-10-CM | POA: Diagnosis not present

## 2019-04-06 DIAGNOSIS — N2581 Secondary hyperparathyroidism of renal origin: Secondary | ICD-10-CM | POA: Diagnosis not present

## 2019-04-06 DIAGNOSIS — Z992 Dependence on renal dialysis: Secondary | ICD-10-CM | POA: Diagnosis not present

## 2019-04-08 DIAGNOSIS — N2581 Secondary hyperparathyroidism of renal origin: Secondary | ICD-10-CM | POA: Diagnosis not present

## 2019-04-08 DIAGNOSIS — Z992 Dependence on renal dialysis: Secondary | ICD-10-CM | POA: Diagnosis not present

## 2019-04-08 DIAGNOSIS — N186 End stage renal disease: Secondary | ICD-10-CM | POA: Diagnosis not present

## 2019-04-10 DIAGNOSIS — N2581 Secondary hyperparathyroidism of renal origin: Secondary | ICD-10-CM | POA: Diagnosis not present

## 2019-04-10 DIAGNOSIS — Z992 Dependence on renal dialysis: Secondary | ICD-10-CM | POA: Diagnosis not present

## 2019-04-10 DIAGNOSIS — N186 End stage renal disease: Secondary | ICD-10-CM | POA: Diagnosis not present

## 2019-04-13 DIAGNOSIS — Z992 Dependence on renal dialysis: Secondary | ICD-10-CM | POA: Diagnosis not present

## 2019-04-13 DIAGNOSIS — N186 End stage renal disease: Secondary | ICD-10-CM | POA: Diagnosis not present

## 2019-04-13 DIAGNOSIS — N2581 Secondary hyperparathyroidism of renal origin: Secondary | ICD-10-CM | POA: Diagnosis not present

## 2019-04-15 DIAGNOSIS — N186 End stage renal disease: Secondary | ICD-10-CM | POA: Diagnosis not present

## 2019-04-15 DIAGNOSIS — N2581 Secondary hyperparathyroidism of renal origin: Secondary | ICD-10-CM | POA: Diagnosis not present

## 2019-04-15 DIAGNOSIS — Z992 Dependence on renal dialysis: Secondary | ICD-10-CM | POA: Diagnosis not present

## 2019-04-17 DIAGNOSIS — Z992 Dependence on renal dialysis: Secondary | ICD-10-CM | POA: Diagnosis not present

## 2019-04-17 DIAGNOSIS — N2581 Secondary hyperparathyroidism of renal origin: Secondary | ICD-10-CM | POA: Diagnosis not present

## 2019-04-17 DIAGNOSIS — N186 End stage renal disease: Secondary | ICD-10-CM | POA: Diagnosis not present

## 2019-04-20 DIAGNOSIS — N2581 Secondary hyperparathyroidism of renal origin: Secondary | ICD-10-CM | POA: Diagnosis not present

## 2019-04-20 DIAGNOSIS — N186 End stage renal disease: Secondary | ICD-10-CM | POA: Diagnosis not present

## 2019-04-20 DIAGNOSIS — Z992 Dependence on renal dialysis: Secondary | ICD-10-CM | POA: Diagnosis not present

## 2019-04-21 DIAGNOSIS — Z992 Dependence on renal dialysis: Secondary | ICD-10-CM | POA: Diagnosis not present

## 2019-04-21 DIAGNOSIS — N186 End stage renal disease: Secondary | ICD-10-CM | POA: Diagnosis not present

## 2019-04-22 DIAGNOSIS — N186 End stage renal disease: Secondary | ICD-10-CM | POA: Diagnosis not present

## 2019-04-22 DIAGNOSIS — Z992 Dependence on renal dialysis: Secondary | ICD-10-CM | POA: Diagnosis not present

## 2019-04-22 DIAGNOSIS — N2581 Secondary hyperparathyroidism of renal origin: Secondary | ICD-10-CM | POA: Diagnosis not present

## 2019-04-24 ENCOUNTER — Other Ambulatory Visit: Payer: Self-pay | Admitting: Internal Medicine

## 2019-04-24 DIAGNOSIS — N2581 Secondary hyperparathyroidism of renal origin: Secondary | ICD-10-CM | POA: Diagnosis not present

## 2019-04-24 DIAGNOSIS — N186 End stage renal disease: Secondary | ICD-10-CM | POA: Diagnosis not present

## 2019-04-24 DIAGNOSIS — Z992 Dependence on renal dialysis: Secondary | ICD-10-CM | POA: Diagnosis not present

## 2019-04-27 DIAGNOSIS — N2581 Secondary hyperparathyroidism of renal origin: Secondary | ICD-10-CM | POA: Diagnosis not present

## 2019-04-27 DIAGNOSIS — Z992 Dependence on renal dialysis: Secondary | ICD-10-CM | POA: Diagnosis not present

## 2019-04-27 DIAGNOSIS — N186 End stage renal disease: Secondary | ICD-10-CM | POA: Diagnosis not present

## 2019-04-29 DIAGNOSIS — Z992 Dependence on renal dialysis: Secondary | ICD-10-CM | POA: Diagnosis not present

## 2019-04-29 DIAGNOSIS — N2581 Secondary hyperparathyroidism of renal origin: Secondary | ICD-10-CM | POA: Diagnosis not present

## 2019-04-29 DIAGNOSIS — N186 End stage renal disease: Secondary | ICD-10-CM | POA: Diagnosis not present

## 2019-04-30 DIAGNOSIS — H524 Presbyopia: Secondary | ICD-10-CM | POA: Diagnosis not present

## 2019-04-30 LAB — HM DIABETES EYE EXAM

## 2019-05-01 DIAGNOSIS — N186 End stage renal disease: Secondary | ICD-10-CM | POA: Diagnosis not present

## 2019-05-01 DIAGNOSIS — N2581 Secondary hyperparathyroidism of renal origin: Secondary | ICD-10-CM | POA: Diagnosis not present

## 2019-05-01 DIAGNOSIS — Z992 Dependence on renal dialysis: Secondary | ICD-10-CM | POA: Diagnosis not present

## 2019-05-04 DIAGNOSIS — N186 End stage renal disease: Secondary | ICD-10-CM | POA: Diagnosis not present

## 2019-05-04 DIAGNOSIS — Z992 Dependence on renal dialysis: Secondary | ICD-10-CM | POA: Diagnosis not present

## 2019-05-04 DIAGNOSIS — N2581 Secondary hyperparathyroidism of renal origin: Secondary | ICD-10-CM | POA: Diagnosis not present

## 2019-05-06 DIAGNOSIS — Z992 Dependence on renal dialysis: Secondary | ICD-10-CM | POA: Diagnosis not present

## 2019-05-06 DIAGNOSIS — Z794 Long term (current) use of insulin: Secondary | ICD-10-CM | POA: Diagnosis not present

## 2019-05-06 DIAGNOSIS — E119 Type 2 diabetes mellitus without complications: Secondary | ICD-10-CM | POA: Diagnosis not present

## 2019-05-06 DIAGNOSIS — N2581 Secondary hyperparathyroidism of renal origin: Secondary | ICD-10-CM | POA: Diagnosis not present

## 2019-05-06 DIAGNOSIS — N186 End stage renal disease: Secondary | ICD-10-CM | POA: Diagnosis not present

## 2019-05-08 DIAGNOSIS — N186 End stage renal disease: Secondary | ICD-10-CM | POA: Diagnosis not present

## 2019-05-08 DIAGNOSIS — Z992 Dependence on renal dialysis: Secondary | ICD-10-CM | POA: Diagnosis not present

## 2019-05-08 DIAGNOSIS — N2581 Secondary hyperparathyroidism of renal origin: Secondary | ICD-10-CM | POA: Diagnosis not present

## 2019-05-09 ENCOUNTER — Other Ambulatory Visit: Payer: Self-pay | Admitting: Internal Medicine

## 2019-05-09 ENCOUNTER — Other Ambulatory Visit: Payer: Self-pay | Admitting: Urology

## 2019-05-09 DIAGNOSIS — N401 Enlarged prostate with lower urinary tract symptoms: Secondary | ICD-10-CM

## 2019-05-11 DIAGNOSIS — N186 End stage renal disease: Secondary | ICD-10-CM | POA: Diagnosis not present

## 2019-05-11 DIAGNOSIS — N2581 Secondary hyperparathyroidism of renal origin: Secondary | ICD-10-CM | POA: Diagnosis not present

## 2019-05-11 DIAGNOSIS — Z992 Dependence on renal dialysis: Secondary | ICD-10-CM | POA: Diagnosis not present

## 2019-05-13 DIAGNOSIS — N186 End stage renal disease: Secondary | ICD-10-CM | POA: Diagnosis not present

## 2019-05-13 DIAGNOSIS — Z992 Dependence on renal dialysis: Secondary | ICD-10-CM | POA: Diagnosis not present

## 2019-05-13 DIAGNOSIS — N2581 Secondary hyperparathyroidism of renal origin: Secondary | ICD-10-CM | POA: Diagnosis not present

## 2019-05-15 DIAGNOSIS — N186 End stage renal disease: Secondary | ICD-10-CM | POA: Diagnosis not present

## 2019-05-15 DIAGNOSIS — N2581 Secondary hyperparathyroidism of renal origin: Secondary | ICD-10-CM | POA: Diagnosis not present

## 2019-05-15 DIAGNOSIS — Z992 Dependence on renal dialysis: Secondary | ICD-10-CM | POA: Diagnosis not present

## 2019-05-18 DIAGNOSIS — N186 End stage renal disease: Secondary | ICD-10-CM | POA: Diagnosis not present

## 2019-05-18 DIAGNOSIS — Z992 Dependence on renal dialysis: Secondary | ICD-10-CM | POA: Diagnosis not present

## 2019-05-18 DIAGNOSIS — N2581 Secondary hyperparathyroidism of renal origin: Secondary | ICD-10-CM | POA: Diagnosis not present

## 2019-05-20 DIAGNOSIS — Z992 Dependence on renal dialysis: Secondary | ICD-10-CM | POA: Diagnosis not present

## 2019-05-20 DIAGNOSIS — N2581 Secondary hyperparathyroidism of renal origin: Secondary | ICD-10-CM | POA: Diagnosis not present

## 2019-05-20 DIAGNOSIS — N186 End stage renal disease: Secondary | ICD-10-CM | POA: Diagnosis not present

## 2019-05-22 DIAGNOSIS — N2581 Secondary hyperparathyroidism of renal origin: Secondary | ICD-10-CM | POA: Diagnosis not present

## 2019-05-22 DIAGNOSIS — Z992 Dependence on renal dialysis: Secondary | ICD-10-CM | POA: Diagnosis not present

## 2019-05-22 DIAGNOSIS — N186 End stage renal disease: Secondary | ICD-10-CM | POA: Diagnosis not present

## 2019-05-25 DIAGNOSIS — N2581 Secondary hyperparathyroidism of renal origin: Secondary | ICD-10-CM | POA: Diagnosis not present

## 2019-05-25 DIAGNOSIS — N186 End stage renal disease: Secondary | ICD-10-CM | POA: Diagnosis not present

## 2019-05-25 DIAGNOSIS — Z992 Dependence on renal dialysis: Secondary | ICD-10-CM | POA: Diagnosis not present

## 2019-05-27 DIAGNOSIS — Z992 Dependence on renal dialysis: Secondary | ICD-10-CM | POA: Diagnosis not present

## 2019-05-27 DIAGNOSIS — N2581 Secondary hyperparathyroidism of renal origin: Secondary | ICD-10-CM | POA: Diagnosis not present

## 2019-05-27 DIAGNOSIS — N186 End stage renal disease: Secondary | ICD-10-CM | POA: Diagnosis not present

## 2019-05-28 DIAGNOSIS — I361 Nonrheumatic tricuspid (valve) insufficiency: Secondary | ICD-10-CM | POA: Diagnosis not present

## 2019-05-28 DIAGNOSIS — Z114 Encounter for screening for human immunodeficiency virus [HIV]: Secondary | ICD-10-CM | POA: Diagnosis not present

## 2019-05-28 DIAGNOSIS — Z7682 Awaiting organ transplant status: Secondary | ICD-10-CM | POA: Diagnosis not present

## 2019-05-28 DIAGNOSIS — I071 Rheumatic tricuspid insufficiency: Secondary | ICD-10-CM | POA: Diagnosis not present

## 2019-05-28 DIAGNOSIS — Z949 Transplanted organ and tissue status, unspecified: Secondary | ICD-10-CM | POA: Diagnosis not present

## 2019-05-28 DIAGNOSIS — N289 Disorder of kidney and ureter, unspecified: Secondary | ICD-10-CM | POA: Diagnosis not present

## 2019-05-28 DIAGNOSIS — N261 Atrophy of kidney (terminal): Secondary | ICD-10-CM | POA: Diagnosis not present

## 2019-05-28 DIAGNOSIS — Z1159 Encounter for screening for other viral diseases: Secondary | ICD-10-CM | POA: Diagnosis not present

## 2019-05-28 DIAGNOSIS — Z125 Encounter for screening for malignant neoplasm of prostate: Secondary | ICD-10-CM | POA: Diagnosis not present

## 2019-05-28 DIAGNOSIS — Z79899 Other long term (current) drug therapy: Secondary | ICD-10-CM | POA: Diagnosis not present

## 2019-05-28 DIAGNOSIS — Z01810 Encounter for preprocedural cardiovascular examination: Secondary | ICD-10-CM | POA: Diagnosis not present

## 2019-05-29 DIAGNOSIS — N2581 Secondary hyperparathyroidism of renal origin: Secondary | ICD-10-CM | POA: Diagnosis not present

## 2019-05-29 DIAGNOSIS — Z992 Dependence on renal dialysis: Secondary | ICD-10-CM | POA: Diagnosis not present

## 2019-05-29 DIAGNOSIS — N186 End stage renal disease: Secondary | ICD-10-CM | POA: Diagnosis not present

## 2019-06-01 DIAGNOSIS — N186 End stage renal disease: Secondary | ICD-10-CM | POA: Diagnosis not present

## 2019-06-01 DIAGNOSIS — N2581 Secondary hyperparathyroidism of renal origin: Secondary | ICD-10-CM | POA: Diagnosis not present

## 2019-06-01 DIAGNOSIS — Z992 Dependence on renal dialysis: Secondary | ICD-10-CM | POA: Diagnosis not present

## 2019-06-03 DIAGNOSIS — N186 End stage renal disease: Secondary | ICD-10-CM | POA: Diagnosis not present

## 2019-06-03 DIAGNOSIS — N2581 Secondary hyperparathyroidism of renal origin: Secondary | ICD-10-CM | POA: Diagnosis not present

## 2019-06-03 DIAGNOSIS — Z992 Dependence on renal dialysis: Secondary | ICD-10-CM | POA: Diagnosis not present

## 2019-06-05 DIAGNOSIS — N186 End stage renal disease: Secondary | ICD-10-CM | POA: Diagnosis not present

## 2019-06-05 DIAGNOSIS — N2581 Secondary hyperparathyroidism of renal origin: Secondary | ICD-10-CM | POA: Diagnosis not present

## 2019-06-05 DIAGNOSIS — Z992 Dependence on renal dialysis: Secondary | ICD-10-CM | POA: Diagnosis not present

## 2019-06-08 DIAGNOSIS — N186 End stage renal disease: Secondary | ICD-10-CM | POA: Diagnosis not present

## 2019-06-08 DIAGNOSIS — N2581 Secondary hyperparathyroidism of renal origin: Secondary | ICD-10-CM | POA: Diagnosis not present

## 2019-06-08 DIAGNOSIS — Z992 Dependence on renal dialysis: Secondary | ICD-10-CM | POA: Diagnosis not present

## 2019-06-10 DIAGNOSIS — N2581 Secondary hyperparathyroidism of renal origin: Secondary | ICD-10-CM | POA: Diagnosis not present

## 2019-06-10 DIAGNOSIS — Z992 Dependence on renal dialysis: Secondary | ICD-10-CM | POA: Diagnosis not present

## 2019-06-10 DIAGNOSIS — N186 End stage renal disease: Secondary | ICD-10-CM | POA: Diagnosis not present

## 2019-06-12 DIAGNOSIS — N186 End stage renal disease: Secondary | ICD-10-CM | POA: Diagnosis not present

## 2019-06-12 DIAGNOSIS — N2581 Secondary hyperparathyroidism of renal origin: Secondary | ICD-10-CM | POA: Diagnosis not present

## 2019-06-12 DIAGNOSIS — Z992 Dependence on renal dialysis: Secondary | ICD-10-CM | POA: Diagnosis not present

## 2019-06-15 DIAGNOSIS — Z7682 Awaiting organ transplant status: Secondary | ICD-10-CM | POA: Diagnosis not present

## 2019-06-15 DIAGNOSIS — N2581 Secondary hyperparathyroidism of renal origin: Secondary | ICD-10-CM | POA: Diagnosis not present

## 2019-06-15 DIAGNOSIS — N186 End stage renal disease: Secondary | ICD-10-CM | POA: Diagnosis not present

## 2019-06-15 DIAGNOSIS — Z005 Encounter for examination of potential donor of organ and tissue: Secondary | ICD-10-CM | POA: Diagnosis not present

## 2019-06-15 DIAGNOSIS — Z992 Dependence on renal dialysis: Secondary | ICD-10-CM | POA: Diagnosis not present

## 2019-06-17 ENCOUNTER — Other Ambulatory Visit: Payer: Self-pay | Admitting: Internal Medicine

## 2019-06-17 DIAGNOSIS — Z992 Dependence on renal dialysis: Secondary | ICD-10-CM | POA: Diagnosis not present

## 2019-06-17 DIAGNOSIS — N186 End stage renal disease: Secondary | ICD-10-CM | POA: Diagnosis not present

## 2019-06-17 DIAGNOSIS — N2581 Secondary hyperparathyroidism of renal origin: Secondary | ICD-10-CM | POA: Diagnosis not present

## 2019-06-19 DIAGNOSIS — N186 End stage renal disease: Secondary | ICD-10-CM | POA: Diagnosis not present

## 2019-06-19 DIAGNOSIS — Z992 Dependence on renal dialysis: Secondary | ICD-10-CM | POA: Diagnosis not present

## 2019-06-19 DIAGNOSIS — N2581 Secondary hyperparathyroidism of renal origin: Secondary | ICD-10-CM | POA: Diagnosis not present

## 2019-06-22 DIAGNOSIS — N2581 Secondary hyperparathyroidism of renal origin: Secondary | ICD-10-CM | POA: Diagnosis not present

## 2019-06-22 DIAGNOSIS — N186 End stage renal disease: Secondary | ICD-10-CM | POA: Diagnosis not present

## 2019-06-22 DIAGNOSIS — Z992 Dependence on renal dialysis: Secondary | ICD-10-CM | POA: Diagnosis not present

## 2019-06-24 DIAGNOSIS — Z23 Encounter for immunization: Secondary | ICD-10-CM | POA: Diagnosis not present

## 2019-06-24 DIAGNOSIS — Z992 Dependence on renal dialysis: Secondary | ICD-10-CM | POA: Diagnosis not present

## 2019-06-24 DIAGNOSIS — N186 End stage renal disease: Secondary | ICD-10-CM | POA: Diagnosis not present

## 2019-06-24 DIAGNOSIS — N2581 Secondary hyperparathyroidism of renal origin: Secondary | ICD-10-CM | POA: Diagnosis not present

## 2019-06-26 ENCOUNTER — Other Ambulatory Visit: Payer: Self-pay

## 2019-06-26 ENCOUNTER — Emergency Department
Admission: EM | Admit: 2019-06-26 | Discharge: 2019-06-26 | Disposition: A | Discharge status: Home / Self Care | Payer: Medicare HMO | Attending: Emergency Medicine | Admitting: Emergency Medicine

## 2019-06-26 ENCOUNTER — Encounter: Payer: Self-pay | Admitting: Emergency Medicine

## 2019-06-26 ENCOUNTER — Emergency Department: Payer: Medicare HMO

## 2019-06-26 DIAGNOSIS — E119 Type 2 diabetes mellitus without complications: Secondary | ICD-10-CM | POA: Diagnosis not present

## 2019-06-26 DIAGNOSIS — N186 End stage renal disease: Secondary | ICD-10-CM

## 2019-06-26 DIAGNOSIS — T82898A Other specified complication of vascular prosthetic devices, implants and grafts, initial encounter: Secondary | ICD-10-CM | POA: Diagnosis not present

## 2019-06-26 DIAGNOSIS — T82868A Thrombosis of vascular prosthetic devices, implants and grafts, initial encounter: Secondary | ICD-10-CM | POA: Diagnosis not present

## 2019-06-26 DIAGNOSIS — Y69 Unspecified misadventure during surgical and medical care: Secondary | ICD-10-CM | POA: Insufficient documentation

## 2019-06-26 DIAGNOSIS — Z03818 Encounter for observation for suspected exposure to other biological agents ruled out: Secondary | ICD-10-CM | POA: Diagnosis not present

## 2019-06-26 DIAGNOSIS — Z20828 Contact with and (suspected) exposure to other viral communicable diseases: Secondary | ICD-10-CM | POA: Diagnosis not present

## 2019-06-26 DIAGNOSIS — Z452 Encounter for adjustment and management of vascular access device: Secondary | ICD-10-CM | POA: Diagnosis present

## 2019-06-26 DIAGNOSIS — Z992 Dependence on renal dialysis: Secondary | ICD-10-CM | POA: Insufficient documentation

## 2019-06-26 DIAGNOSIS — I132 Hypertensive heart and chronic kidney disease with heart failure and with stage 5 chronic kidney disease, or end stage renal disease: Secondary | ICD-10-CM | POA: Diagnosis not present

## 2019-06-26 DIAGNOSIS — I509 Heart failure, unspecified: Secondary | ICD-10-CM | POA: Diagnosis not present

## 2019-06-26 DIAGNOSIS — J9811 Atelectasis: Secondary | ICD-10-CM | POA: Diagnosis not present

## 2019-06-26 DIAGNOSIS — I120 Hypertensive chronic kidney disease with stage 5 chronic kidney disease or end stage renal disease: Secondary | ICD-10-CM | POA: Diagnosis not present

## 2019-06-26 LAB — CBC WITH DIFFERENTIAL/PLATELET
Abs Immature Granulocytes: 0.02 10*3/uL (ref 0.00–0.07)
Basophils Absolute: 0 10*3/uL (ref 0.0–0.1)
Basophils Relative: 0 %
Eosinophils Absolute: 0.1 10*3/uL (ref 0.0–0.5)
Eosinophils Relative: 2 %
HCT: 35.3 % — ABNORMAL LOW (ref 39.0–52.0)
Hemoglobin: 11.2 g/dL — ABNORMAL LOW (ref 13.0–17.0)
Immature Granulocytes: 0 %
Lymphocytes Relative: 40 %
Lymphs Abs: 2.6 10*3/uL (ref 0.7–4.0)
MCH: 28.6 pg (ref 26.0–34.0)
MCHC: 31.7 g/dL (ref 30.0–36.0)
MCV: 90.3 fL (ref 80.0–100.0)
Monocytes Absolute: 0.7 10*3/uL (ref 0.1–1.0)
Monocytes Relative: 11 %
Neutro Abs: 3 10*3/uL (ref 1.7–7.7)
Neutrophils Relative %: 47 %
Platelets: 157 10*3/uL (ref 150–400)
RBC: 3.91 MIL/uL — ABNORMAL LOW (ref 4.22–5.81)
RDW: 15.5 % (ref 11.5–15.5)
WBC: 6.5 10*3/uL (ref 4.0–10.5)
nRBC: 0 % (ref 0.0–0.2)

## 2019-06-26 LAB — BASIC METABOLIC PANEL
Anion gap: 15 (ref 5–15)
BUN: 70 mg/dL — ABNORMAL HIGH (ref 8–23)
CO2: 27 mmol/L (ref 22–32)
Calcium: 9.2 mg/dL (ref 8.9–10.3)
Chloride: 96 mmol/L — ABNORMAL LOW (ref 98–111)
Creatinine, Ser: 9.62 mg/dL — ABNORMAL HIGH (ref 0.61–1.24)
GFR calc Af Amer: 6 mL/min — ABNORMAL LOW (ref >60)
GFR calc non Af Amer: 5 mL/min — ABNORMAL LOW (ref >60)
Glucose, Bld: 102 mg/dL — ABNORMAL HIGH (ref 70–99)
Potassium: 5.2 mmol/L — ABNORMAL HIGH (ref 3.5–5.1)
Sodium: 138 mmol/L (ref 135–145)

## 2019-06-26 LAB — SARS CORONAVIRUS 2 BY RT PCR (HOSPITAL ORDER, PERFORMED IN ~~LOC~~ HOSPITAL LAB): SARS Coronavirus 2: NEGATIVE
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
## 2019-06-26 NOTE — ED Notes (Signed)
Patient transported to Ultrasound 

## 2019-06-26 NOTE — ED Triage Notes (Signed)
Pt to ER states went to his regular dialysis today and was told his right upper arm fistula was clotted.  States had his regular dialysis on Wednesday.

## 2019-06-26 NOTE — ED Notes (Signed)
Consulting MD at bedside

## 2019-06-26 NOTE — ED Notes (Signed)
Patient here for vascular access problem.  Per dialysis, his fistula was clotted.  Patient denies any other complaints.
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
## 2019-06-26 NOTE — Discharge Instructions (Addendum)
You will receive a call later tonight to update you on the timing of your dialysis catheter placement tomorrow. Please return to the hospital at your scheduled time for the procedure and return to the ED sooner for difficulty breathing or other concerning symptoms.

## 2019-06-26 NOTE — ED Notes (Signed)
Esign not working at this time. Pt verbalized discharge instructions and has no questions at this time. 

## 2019-06-27 ENCOUNTER — Encounter
Admission: RE | Disposition: A | Discharge status: Home / Self Care | Payer: Self-pay | Source: Ambulatory Visit | Attending: *Deleted

## 2019-06-27 ENCOUNTER — Ambulatory Visit
Admission: RE | Admit: 2019-06-27 | Discharge: 2019-06-27 | Disposition: A | Discharge status: Home / Self Care | Payer: Medicare HMO | Source: Ambulatory Visit | Attending: Vascular Surgery | Admitting: Vascular Surgery

## 2019-06-27 ENCOUNTER — Encounter: Payer: Self-pay | Admitting: Anesthesiology

## 2019-06-27 ENCOUNTER — Ambulatory Visit: Payer: Medicare HMO

## 2019-06-27 ENCOUNTER — Ambulatory Visit: Payer: Medicare HMO | Admitting: Anesthesiology

## 2019-06-27 DIAGNOSIS — Z87820 Personal history of traumatic brain injury: Secondary | ICD-10-CM | POA: Diagnosis not present

## 2019-06-27 DIAGNOSIS — Z886 Allergy status to analgesic agent status: Secondary | ICD-10-CM | POA: Diagnosis not present

## 2019-06-27 DIAGNOSIS — T82868A Thrombosis of vascular prosthetic devices, implants and grafts, initial encounter: Secondary | ICD-10-CM | POA: Insufficient documentation

## 2019-06-27 DIAGNOSIS — J45909 Unspecified asthma, uncomplicated: Secondary | ICD-10-CM | POA: Insufficient documentation

## 2019-06-27 DIAGNOSIS — F329 Major depressive disorder, single episode, unspecified: Secondary | ICD-10-CM | POA: Diagnosis not present

## 2019-06-27 DIAGNOSIS — I5032 Chronic diastolic (congestive) heart failure: Secondary | ICD-10-CM | POA: Insufficient documentation

## 2019-06-27 DIAGNOSIS — Z23 Encounter for immunization: Secondary | ICD-10-CM | POA: Diagnosis not present

## 2019-06-27 DIAGNOSIS — E785 Hyperlipidemia, unspecified: Secondary | ICD-10-CM | POA: Insufficient documentation

## 2019-06-27 DIAGNOSIS — T82590A Other mechanical complication of surgically created arteriovenous fistula, initial encounter: Secondary | ICD-10-CM | POA: Diagnosis not present

## 2019-06-27 DIAGNOSIS — Z992 Dependence on renal dialysis: Secondary | ICD-10-CM | POA: Insufficient documentation

## 2019-06-27 DIAGNOSIS — E7800 Pure hypercholesterolemia, unspecified: Secondary | ICD-10-CM | POA: Diagnosis not present

## 2019-06-27 DIAGNOSIS — Z452 Encounter for adjustment and management of vascular access device: Secondary | ICD-10-CM | POA: Diagnosis not present

## 2019-06-27 DIAGNOSIS — Y832 Surgical operation with anastomosis, bypass or graft as the cause of abnormal reaction of the patient, or of later complication, without mention of misadventure at the time of the procedure: Secondary | ICD-10-CM | POA: Insufficient documentation

## 2019-06-27 DIAGNOSIS — Z6833 Body mass index (BMI) 33.0-33.9, adult: Secondary | ICD-10-CM | POA: Insufficient documentation

## 2019-06-27 DIAGNOSIS — N186 End stage renal disease: Secondary | ICD-10-CM | POA: Insufficient documentation

## 2019-06-27 DIAGNOSIS — I509 Heart failure, unspecified: Secondary | ICD-10-CM | POA: Diagnosis not present

## 2019-06-27 DIAGNOSIS — Z79899 Other long term (current) drug therapy: Secondary | ICD-10-CM | POA: Insufficient documentation

## 2019-06-27 DIAGNOSIS — I132 Hypertensive heart and chronic kidney disease with heart failure and with stage 5 chronic kidney disease, or end stage renal disease: Secondary | ICD-10-CM | POA: Insufficient documentation

## 2019-06-27 DIAGNOSIS — N2581 Secondary hyperparathyroidism of renal origin: Secondary | ICD-10-CM | POA: Diagnosis not present

## 2019-06-27 DIAGNOSIS — E1122 Type 2 diabetes mellitus with diabetic chronic kidney disease: Secondary | ICD-10-CM | POA: Diagnosis not present

## 2019-06-27 DIAGNOSIS — M109 Gout, unspecified: Secondary | ICD-10-CM | POA: Insufficient documentation

## 2019-06-27 HISTORY — PX: INSERTION OF DIALYSIS CATHETER: SHX1324

## 2019-06-27 LAB — BASIC METABOLIC PANEL
Anion gap: 15 (ref 5–15)
BUN: 81 mg/dL — ABNORMAL HIGH (ref 8–23)
CO2: 26 mmol/L (ref 22–32)
Calcium: 9.1 mg/dL (ref 8.9–10.3)
Chloride: 98 mmol/L (ref 98–111)
Creatinine, Ser: 10.17 mg/dL — ABNORMAL HIGH (ref 0.61–1.24)
GFR calc Af Amer: 6 mL/min — ABNORMAL LOW (ref >60)
GFR calc non Af Amer: 5 mL/min — ABNORMAL LOW (ref >60)
Glucose, Bld: 172 mg/dL — ABNORMAL HIGH (ref 70–99)
Potassium: 5.2 mmol/L — ABNORMAL HIGH (ref 3.5–5.1)
Sodium: 139 mmol/L (ref 135–145)

## 2019-06-27 LAB — GLUCOSE, CAPILLARY: Glucose-Capillary: 148 mg/dL — ABNORMAL HIGH (ref 70–99)

## 2019-06-27 SURGERY — INSERTION OF DIALYSIS CATHETER
Anesthesia: Choice | Laterality: Bilateral

## 2019-06-27 SURGERY — INSERTION OF DIALYSIS CATHETER
Anesthesia: General | Laterality: Bilateral

## 2019-06-27 MED ORDER — LIDOCAINE HCL (PF) 2 % IJ SOLN
INTRAMUSCULAR | Status: DC | PRN
  Administered 2019-06-27: 15 mL

## 2019-06-27 MED ORDER — PROPOFOL 10 MG/ML IV BOLUS
INTRAVENOUS | Status: DC | PRN
  Administered 2019-06-27: 30 mg via INTRAVENOUS

## 2019-06-27 MED ORDER — FENTANYL CITRATE (PF) 100 MCG/2ML IJ SOLN
INTRAMUSCULAR | Status: AC
  Filled 2019-06-27: qty 2

## 2019-06-27 MED ORDER — PROPOFOL 500 MG/50ML IV EMUL
INTRAVENOUS | Status: DC | PRN
  Administered 2019-06-27: 50 ug/kg/min via INTRAVENOUS

## 2019-06-27 MED ORDER — LIDOCAINE HCL (PF) 2 % IJ SOLN
INTRAMUSCULAR | Status: AC
  Filled 2019-06-27: qty 50

## 2019-06-27 MED ORDER — MIDAZOLAM HCL 2 MG/2ML IJ SOLN
INTRAMUSCULAR | Status: DC | PRN
  Administered 2019-06-27 (×2): 1 mg via INTRAVENOUS

## 2019-06-27 MED ORDER — PROPOFOL 500 MG/50ML IV EMUL
INTRAVENOUS | Status: AC
  Filled 2019-06-27: qty 50

## 2019-06-27 MED ORDER — SODIUM CHLORIDE 0.9 % IV SOLN
INTRAVENOUS | Status: DC | PRN
  Administered 2019-06-27: 09:00:00 via INTRAVENOUS

## 2019-06-27 MED ORDER — PHENYLEPHRINE HCL (PRESSORS) 10 MG/ML IV SOLN
INTRAVENOUS | Status: DC | PRN
  Administered 2019-06-27: 100 ug via INTRAVENOUS
  Administered 2019-06-27: 50 ug via INTRAVENOUS
  Administered 2019-06-27: 100 ug via INTRAVENOUS
  Administered 2019-06-27 (×2): 50 ug via INTRAVENOUS

## 2019-06-27 MED ORDER — LIDOCAINE HCL (CARDIAC) PF 100 MG/5ML IV SOSY
PREFILLED_SYRINGE | INTRAVENOUS | Status: DC | PRN
  Administered 2019-06-27: 60 mg via INTRAVENOUS

## 2019-06-27 MED ORDER — MIDAZOLAM HCL 2 MG/2ML IJ SOLN
INTRAMUSCULAR | Status: AC
  Filled 2019-06-27: qty 2

## 2019-06-27 MED ORDER — SODIUM CHLORIDE 0.9 % IV SOLN
INTRAVENOUS | Status: DC | PRN
  Administered 2019-06-27: 45 mL via INTRAMUSCULAR

## 2019-06-27 MED ORDER — ONDANSETRON HCL 4 MG/2ML IJ SOLN
4.0000 mg | Freq: Once | INTRAMUSCULAR | Status: AC | PRN
  Administered 2019-06-27: 4 mg via INTRAVENOUS

## 2019-06-27 MED ORDER — FENTANYL CITRATE (PF) 100 MCG/2ML IJ SOLN
25.0000 ug | INTRAMUSCULAR | Status: DC | PRN
  Administered 2019-06-27 (×2): 25 ug via INTRAVENOUS

## 2019-06-27 MED ORDER — ONDANSETRON HCL 4 MG/2ML IJ SOLN
INTRAMUSCULAR | Status: AC
  Filled 2019-06-27: qty 2

## 2019-06-27 MED ORDER — HEPARIN SODIUM (PORCINE) 5000 UNIT/ML IJ SOLN
INTRAMUSCULAR | Status: AC
  Filled 2019-06-27: qty 1

## 2019-06-27 MED ORDER — CEFAZOLIN SODIUM-DEXTROSE 1-4 GM/50ML-% IV SOLN
INTRAVENOUS | Status: DC | PRN
  Administered 2019-06-27: 1 g via INTRAVENOUS

## 2019-06-27 SURGICAL SUPPLY — 44 items
BAG DECANTER FOR FLEXI CONT (MISCELLANEOUS) ×2 IMPLANT
BIOPATCH RED 1 DISK 7.0 (GAUZE/BANDAGES/DRESSINGS) IMPLANT
CATH CANNON HEMO 15FR 23CM (HEMODIALYSIS SUPPLIES) ×2 IMPLANT
CATH PALINDROME RT-P 15FX19CM (CATHETERS) IMPLANT
CATH PALINDROME RT-P 15FX23CM (CATHETERS) IMPLANT
CATH PALINDROME RT-P 15FX28CM (CATHETERS) IMPLANT
CATH PALINDROME RT-P 15FX55CM (CATHETERS) IMPLANT
CATH STRAIGHT 5FR 65CM (CATHETERS) IMPLANT
COVER PROBE W GEL 5X96 (DRAPES) ×2 IMPLANT
COVER SURGICAL LIGHT HANDLE (MISCELLANEOUS) ×2 IMPLANT
COVER WAND RF STERILE (DRAPES) ×2 IMPLANT
DECANTER SPIKE VIAL GLASS SM (MISCELLANEOUS) IMPLANT
DERMABOND ADVANCED (GAUZE/BANDAGES/DRESSINGS) ×1
DERMABOND ADVANCED .7 DNX12 (GAUZE/BANDAGES/DRESSINGS) ×1 IMPLANT
DRAPE C-ARM 42X72 X-RAY (DRAPES) ×2 IMPLANT
DRAPE CHEST BREAST 15X10 FENES (DRAPES) ×2 IMPLANT
DRSG TEGADERM 4X4.75 (GAUZE/BANDAGES/DRESSINGS) ×2 IMPLANT
GAUZE 4X4 16PLY RFD (DISPOSABLE) ×2 IMPLANT
GLOVE BIO SURGEON STRL SZ7 (GLOVE) ×6 IMPLANT
GLOVE BIOGEL PI IND STRL 7.5 (GLOVE) ×1 IMPLANT
GLOVE BIOGEL PI INDICATOR 7.5 (GLOVE) ×1
GOWN STRL REUS W/ TWL LRG LVL3 (GOWN DISPOSABLE) ×2 IMPLANT
GOWN STRL REUS W/TWL LRG LVL3 (GOWN DISPOSABLE) ×2
KIT BASIN OR (CUSTOM PROCEDURE TRAY) ×2 IMPLANT
KIT TURNOVER KIT B (KITS) ×2 IMPLANT
NEEDLE 18GX1X1/2 (RX/OR ONLY) (NEEDLE) ×2 IMPLANT
NEEDLE HYPO 25GX1X1/2 BEV (NEEDLE) ×2 IMPLANT
NS IRRIG 1000ML POUR BTL (IV SOLUTION) ×2 IMPLANT
PACK SURGICAL SETUP 50X90 (CUSTOM PROCEDURE TRAY) ×2 IMPLANT
PAD ARMBOARD 7.5X6 YLW CONV (MISCELLANEOUS) IMPLANT
SET MICROPUNCTURE 5F STIFF (MISCELLANEOUS) IMPLANT
SOAP 2 % CHG 4 OZ (WOUND CARE) ×2 IMPLANT
SPONGE GAUZE 2X2 8PLY STRL LF (GAUZE/BANDAGES/DRESSINGS) ×2 IMPLANT
SUT ETHILON 3 0 PS 1 (SUTURE) ×2 IMPLANT
SUT MNCRL AB 4-0 PS2 18 (SUTURE) ×2 IMPLANT
SYR 10ML LL (SYRINGE) ×2 IMPLANT
SYR 20ML LL LF (SYRINGE) ×4 IMPLANT
SYR 3ML LL SCALE MARK (SYRINGE) ×2 IMPLANT
SYR 5ML LL (SYRINGE) ×2 IMPLANT
SYR CONTROL 10ML LL (SYRINGE) ×2 IMPLANT
TOWEL GREEN STERILE (TOWEL DISPOSABLE) ×2 IMPLANT
TOWEL GREEN STERILE FF (TOWEL DISPOSABLE) ×2 IMPLANT
WATER STERILE IRR 1000ML POUR (IV SOLUTION) IMPLANT
WIRE AMPLATZ SS-J .035X180CM (WIRE) IMPLANT

## 2019-06-27 NOTE — Progress Notes (Signed)
Dr. Marcello Moores notified labs drawn for bmp.  Potassium was 5.2 as of 06-26-2019.  Will wait for result this am.

## 2019-06-27 NOTE — Discharge Instructions (Signed)

## 2019-06-27 NOTE — H&P (Signed)
   History and Physical Update  The patient was interviewed and re-examined.  The patient's previous History and Physical has been reviewed and is unchanged from my consult on 06/26/19.  There is no change in the plan of care: Placement of tunneled dialysis catheter.  Adele Barthel, MD, FACS [facility] [place] Office: [phone] Pager: [phone]  06/27/2019, 7:49 AM

## 2019-06-27 NOTE — Anesthesia Postprocedure Evaluation (Signed)
Anesthesia Post Note  Patient: Richard Chen  Procedure(s) Performed: INSERTION OF DIALYSIS CATHETER (Bilateral )  Patient location during evaluation: PACU Anesthesia Type: General Level of consciousness: awake and alert Pain management: pain level controlled Vital Signs Assessment: post-procedure vital signs reviewed and stable Respiratory status: spontaneous breathing, nonlabored ventilation, respiratory function stable and patient connected to nasal cannula oxygen Cardiovascular status: blood pressure returned to baseline and stable Postop Assessment: no apparent nausea or vomiting Anesthetic complications: no     Last Vitals:  Vitals:   06/27/19 1048 06/27/19 1057  BP: (!) 178/67   Pulse: (!) 44 (!) 47  Resp: 11 11  Temp: (!) 36.4 C   SpO2: 99% 99%    Last Pain:  Vitals:   06/27/19 1057  TempSrc:   PainSc: [place]

## 2019-06-27 NOTE — Anesthesia Preprocedure Evaluation (Addendum)
Anesthesia Evaluation  Patient identified by MRN, date of birth, ID band Patient awake    Reviewed: Allergy & Precautions, NPO status , Patient's Chart, lab work & pertinent test results, reviewed documented beta blocker date and time   History of Anesthesia Complications (+) PONV and history of anesthetic complications  Airway Mallampati: III  TM Distance: >3 FB     Dental  (+) Chipped   Pulmonary asthma ,           Cardiovascular hypertension, Pt. on medications and Pt. on home beta blockers +CHF       Neuro/Psych  Headaches, PSYCHIATRIC DISORDERS Anxiety Depression    GI/Hepatic   Endo/Other  diabetes, Type 2  Renal/GU ESRFRenal disease     Musculoskeletal   Abdominal   Peds  Hematology  (+) anemia ,   Anesthesia Other Findings Obese. Gout.  Reproductive/Obstetrics                            Anesthesia Physical Anesthesia Plan  ASA: III  Anesthesia Plan: General   Post-op Pain Management:    Induction: Intravenous  PONV Risk Score and Plan:   Airway Management Planned: Oral ETT  Additional Equipment:   Intra-op Plan:   Post-operative Plan:   Informed Consent: I have reviewed the patients History and Physical, chart, labs and discussed the procedure including the risks, benefits and alternatives for the proposed anesthesia with the patient or authorized representative who has indicated his/her understanding and acceptance.       Plan Discussed with: CRNA  Anesthesia Plan Comments:         Anesthesia Quick Evaluation

## 2019-06-27 NOTE — Anesthesia Post-op Follow-up Note (Signed)
Anesthesia QCDR form completed.        

## 2019-06-27 NOTE — Transfer of Care (Signed)
Immediate Anesthesia Transfer of Care Note  Patient: Richard Chen  Procedure(s) Performed: INSERTION OF DIALYSIS CATHETER (Bilateral )  Patient Location: PACU  Anesthesia Type:MAC  Level of Consciousness: awake, alert  and oriented  Airway & Oxygen Therapy: Patient Spontanous Breathing and Patient connected to nasal cannula oxygen  Post-op Assessment: Report given to RN and Post -op Vital signs reviewed and stable  Post vital signs: Reviewed and stable  Last Vitals:  Vitals Value Taken Time  BP    Temp    Pulse 50 06/27/19 1002  Resp 19 06/27/19 1002  SpO2 100 % 06/27/19 1002  Vitals shown include unvalidated device data.  Last Pain:  Vitals:   06/27/19 0740  TempSrc: Temporal  PainSc: 0-No pain         Complications: No apparent anesthesia complications

## 2019-06-27 NOTE — Op Note (Signed)
OPERATIVE NOTE  PROCEDURE: 1.  Left internal jugular vein tunneled dialysis catheter placement 2.  Left  internal jugular vein cannulation under ultrasound guidance  PRE-OPERATIVE DIAGNOSIS: end-stage renal failure  POST-OPERATIVE DIAGNOSIS: same as above  SURGEON: Adele Barthel, MD  ANESTHESIA: local and MAC  ESTIMATED BLOOD LOSS: 30 cc  FINDING(S): 1.  Tips of the catheter in the right atrium on fluoroscopy 2.  No obvious pneumothorax on fluoroscopy  SPECIMEN(S):  none  INDICATIONS:   Richard Chen is a 63 y.o. male who presents with hemodialysis dependence.  The patient presents for tunneled dialysis catheter placement.  The patient is aware the risks of tunneled dialysis catheter placement include but are not limited to: bleeding, infection, central venous injury, pneumothorax, possible venous stenosis, possible malpositioning in the venous system, and possible infections related to long-term catheter presence.  The patient was aware of these risks and agreed to proceed.   DESCRIPTION: After written full informed consent was obtained from the patient, the patient was taken back to the operating room.  Prior to induction, the patient was given IV antibiotics.  After obtaining adequate sedation, the patient was prepped and draped in the standard fashion for a chest or neck tunneled dialysis catheter placement.     The cannulation site, the catheter exit site, and tract for the subcutaneous tunnel were then anesthestized with a total of 15 cc of 2% lidocaine without epinephrine.  Under ultrasound guidance, the left internal jugular vein was cannulated with the 18 gauge needle.  A J-wire was then placed down into the inferior vena cava under fluoroscopic guidance.  The wire was then secured in place with a clamp to the drapes.  The tapered wire guide was left occluding the lumen of the needle to avoid air embolism.    I then made stab incisions at the neck and exit sites.   I  dissected from the exit site to the cannulation site with a tunneler.   The wire was then unclamped and I removed the needle.  The skin tract and venotomy was dilated serially with graduated dilators, passed under fluoroscopic guidance.   Finally, the dilator-sheath was placed under fluoroscopic guidance into the superior vena cava.  The central dilator and wire were removed.  A 23 cm Arrow split-tip dialysis catheter was placed under fluoroscopic guidance down into the right atrium.    The sheath was broken and peeled away while holding the catheter cuff at the level of the skin.  The catheter was clamped with the plastic clamp and the back end of this catheter was transected.  One lumen was docked onto the tunneler.  The catheter was delivered through the subcutaneous tunnel by pulling the tunneler out the exit site.  The catheter was reclamped and was transected a second time, revealing the two lumens of this catheter.  The catheter collar was loaded over the back end of the catheter.  The two  ports were docked onto these two lumens.  The catheter collar was then snapped into place, securing the two ports.  Each port was tested by aspirating and flushing.  No resistance was noted.  Each port was then thoroughly flushed with heparinized saline.  The catheter was secured in placed with two interrupted stitches of 3-0 Nylon tied to the catheter.  The neck incision was closed with a U-stitch of 4-0 Monocryl.  The neck and chest incision were cleaned.  The closed neck incision was reinforced with Dermabond and sterile bandages  applied to the catheter exit site.  Each port was then loaded with concentrated heparin (1000 Units/mL) at the manufacturer recommended volumes to each port.  Sterile caps were applied to each port.    On completion fluoroscopy, the tips of the catheter were in the right atrium, and there was no evidence of pneumothorax.   COMPLICATIONS: none  CONDITION: stable   Adele Barthel, MD,  [facility] [place] Office: [phone] Pager: [phone]  06/27/2019, 9:55 AM

## 2019-06-28 ENCOUNTER — Encounter: Payer: Self-pay | Admitting: Vascular Surgery

## 2019-06-29 DIAGNOSIS — N186 End stage renal disease: Secondary | ICD-10-CM | POA: Diagnosis not present

## 2019-06-29 DIAGNOSIS — Z23 Encounter for immunization: Secondary | ICD-10-CM | POA: Diagnosis not present

## 2019-06-29 DIAGNOSIS — Z992 Dependence on renal dialysis: Secondary | ICD-10-CM | POA: Diagnosis not present

## 2019-06-29 DIAGNOSIS — N2581 Secondary hyperparathyroidism of renal origin: Secondary | ICD-10-CM | POA: Diagnosis not present

## 2019-06-30 ENCOUNTER — Telehealth (INDEPENDENT_AMBULATORY_CARE_PROVIDER_SITE_OTHER): Payer: Self-pay

## 2019-06-30 ENCOUNTER — Other Ambulatory Visit (INDEPENDENT_AMBULATORY_CARE_PROVIDER_SITE_OTHER): Payer: Self-pay | Admitting: Nurse Practitioner

## 2019-06-30 NOTE — Telephone Encounter (Signed)
Spoke with the patient and he is scheduled to have his procedure on 07/01/2019 with Dr. Murlean Iba patient has an arrival time of 11:15 am to the [facility]. Patient had his Covid test on Saturday and per pre-admit the patient was negative so does not have to have the test again. Pre-procedure instructions were discussed as well.

## 2019-07-01 ENCOUNTER — Other Ambulatory Visit: Payer: Self-pay

## 2019-07-01 ENCOUNTER — Ambulatory Visit
Admission: RE | Admit: 2019-07-01 | Discharge: 2019-07-01 | Disposition: A | Discharge status: Home / Self Care | Payer: Medicare HMO | Attending: Vascular Surgery | Admitting: Vascular Surgery

## 2019-07-01 ENCOUNTER — Encounter
Admission: RE | Disposition: A | Discharge status: Home / Self Care | Payer: Self-pay | Source: Home / Self Care | Attending: Vascular Surgery

## 2019-07-01 DIAGNOSIS — Z992 Dependence on renal dialysis: Secondary | ICD-10-CM | POA: Insufficient documentation

## 2019-07-01 DIAGNOSIS — Y841 Kidney dialysis as the cause of abnormal reaction of the patient, or of later complication, without mention of misadventure at the time of the procedure: Secondary | ICD-10-CM | POA: Insufficient documentation

## 2019-07-01 DIAGNOSIS — I509 Heart failure, unspecified: Secondary | ICD-10-CM | POA: Diagnosis not present

## 2019-07-01 DIAGNOSIS — Z23 Encounter for immunization: Secondary | ICD-10-CM | POA: Diagnosis not present

## 2019-07-01 DIAGNOSIS — E1122 Type 2 diabetes mellitus with diabetic chronic kidney disease: Secondary | ICD-10-CM | POA: Insufficient documentation

## 2019-07-01 DIAGNOSIS — N2581 Secondary hyperparathyroidism of renal origin: Secondary | ICD-10-CM | POA: Diagnosis not present

## 2019-07-01 DIAGNOSIS — M109 Gout, unspecified: Secondary | ICD-10-CM | POA: Insufficient documentation

## 2019-07-01 DIAGNOSIS — T82868A Thrombosis of vascular prosthetic devices, implants and grafts, initial encounter: Secondary | ICD-10-CM | POA: Insufficient documentation

## 2019-07-01 DIAGNOSIS — N186 End stage renal disease: Secondary | ICD-10-CM

## 2019-07-01 DIAGNOSIS — Z79899 Other long term (current) drug therapy: Secondary | ICD-10-CM | POA: Insufficient documentation

## 2019-07-01 DIAGNOSIS — I132 Hypertensive heart and chronic kidney disease with heart failure and with stage 5 chronic kidney disease, or end stage renal disease: Secondary | ICD-10-CM | POA: Insufficient documentation

## 2019-07-01 DIAGNOSIS — E785 Hyperlipidemia, unspecified: Secondary | ICD-10-CM | POA: Insufficient documentation

## 2019-07-01 DIAGNOSIS — F419 Anxiety disorder, unspecified: Secondary | ICD-10-CM | POA: Diagnosis not present

## 2019-07-01 DIAGNOSIS — F329 Major depressive disorder, single episode, unspecified: Secondary | ICD-10-CM | POA: Diagnosis not present

## 2019-07-01 HISTORY — PX: PERIPHERAL VASCULAR THROMBECTOMY: CATH118306

## 2019-07-01 LAB — POTASSIUM (ARMC VASCULAR LAB ONLY): Potassium (ARMC vascular lab): 3.7 (ref 3.5–5.1)

## 2019-07-01 LAB — GLUCOSE, CAPILLARY
Glucose-Capillary: 101 mg/dL — ABNORMAL HIGH (ref 70–99)
Glucose-Capillary: 95 mg/dL (ref 70–99)

## 2019-07-01 SURGERY — PERIPHERAL VASCULAR THROMBECTOMY
Anesthesia: Moderate Sedation | Laterality: Right

## 2019-07-01 MED ORDER — DIPHENHYDRAMINE HCL 50 MG/ML IJ SOLN
INTRAMUSCULAR | Status: AC
  Filled 2019-07-01: qty 1

## 2019-07-01 MED ORDER — HEPARIN SODIUM (PORCINE) 1000 UNIT/ML IJ SOLN
INTRAMUSCULAR | Status: AC
  Filled 2019-07-01: qty 1

## 2019-07-01 MED ORDER — DIPHENHYDRAMINE HCL 50 MG/ML IJ SOLN
50.0000 mg | Freq: Once | INTRAMUSCULAR | Status: AC | PRN
  Administered 2019-07-01: 50 mg via INTRAVENOUS

## 2019-07-01 MED ORDER — MIDAZOLAM HCL 5 MG/5ML IJ SOLN
INTRAMUSCULAR | Status: AC
  Filled 2019-07-01: qty 10

## 2019-07-01 MED ORDER — ONDANSETRON HCL 4 MG/2ML IJ SOLN: 4.0000 mg | Freq: Four times a day (QID) | INTRAMUSCULAR | Status: DC | PRN

## 2019-07-01 MED ORDER — ALTEPLASE 2 MG IJ SOLR
INTRAMUSCULAR | Status: AC
  Filled 2019-07-01: qty 4

## 2019-07-01 MED ORDER — FAMOTIDINE 20 MG PO TABS
40.0000 mg | ORAL_TABLET | Freq: Once | ORAL | Status: AC | PRN
  Administered 2019-07-01: 40 mg via ORAL

## 2019-07-01 MED ORDER — FAMOTIDINE 20 MG PO TABS
ORAL_TABLET | ORAL | Status: AC
  Filled 2019-07-01: qty 2

## 2019-07-01 MED ORDER — HYDROMORPHONE HCL 1 MG/ML IJ SOLN: 1.0000 mg | Freq: Once | INTRAMUSCULAR | Status: DC | PRN

## 2019-07-01 MED ORDER — CEFAZOLIN SODIUM-DEXTROSE 1-4 GM/50ML-% IV SOLN
1.0000 g | Freq: Once | INTRAVENOUS | Status: AC
  Administered 2019-07-01: 1 g via INTRAVENOUS

## 2019-07-01 MED ORDER — SODIUM CHLORIDE 0.9 % IV SOLN: INTRAVENOUS | Status: DC

## 2019-07-01 MED ORDER — IODIXANOL 320 MG/ML IV SOLN
INTRAVENOUS | Status: DC | PRN
  Administered 2019-07-01: 40 mL via INTRAVENOUS

## 2019-07-01 MED ORDER — MIDAZOLAM HCL 2 MG/ML PO SYRP: 8.0000 mg | ORAL_SOLUTION | Freq: Once | ORAL | Status: DC | PRN

## 2019-07-01 MED ORDER — HYDRALAZINE HCL 20 MG/ML IJ SOLN
INTRAMUSCULAR | Status: AC
  Filled 2019-07-01: qty 2

## 2019-07-01 MED ORDER — ALTEPLASE 2 MG IJ SOLR
INTRAMUSCULAR | Status: AC
  Filled 2019-07-01: qty 8

## 2019-07-01 MED ORDER — HYDRALAZINE HCL 20 MG/ML IJ SOLN
INTRAMUSCULAR | Status: DC | PRN
  Administered 2019-07-01 (×3): 10 mg via INTRAVENOUS

## 2019-07-01 MED ORDER — MIDAZOLAM HCL 2 MG/2ML IJ SOLN
INTRAMUSCULAR | Status: DC | PRN
  Administered 2019-07-01: 1 mg via INTRAVENOUS
  Administered 2019-07-01: 2 mg via INTRAVENOUS

## 2019-07-01 MED ORDER — FENTANYL CITRATE (PF) 100 MCG/2ML IJ SOLN
INTRAMUSCULAR | Status: AC
  Filled 2019-07-01: qty 4

## 2019-07-01 MED ORDER — METHYLPREDNISOLONE SODIUM SUCC 125 MG IJ SOLR
INTRAMUSCULAR | Status: AC
  Filled 2019-07-01: qty 2

## 2019-07-01 MED ORDER — METHYLPREDNISOLONE SODIUM SUCC 125 MG IJ SOLR
125.0000 mg | Freq: Once | INTRAMUSCULAR | Status: AC | PRN
  Administered 2019-07-01: 125 mg via INTRAVENOUS

## 2019-07-01 MED ORDER — FENTANYL CITRATE (PF) 100 MCG/2ML IJ SOLN
INTRAMUSCULAR | Status: DC | PRN
  Administered 2019-07-01: 50 ug via INTRAVENOUS
  Administered 2019-07-01: 25 ug via INTRAVENOUS

## 2019-07-01 SURGICAL SUPPLY — 25 items
BALLN DORADO 7X80X80 (BALLOONS) ×2
BALLN ULTRVRSE 10X60X75 (BALLOONS) ×2
BALLOON DORADO 7X80X80 (BALLOONS) ×1 IMPLANT
BALLOON ULTRVRSE 10X60X75 (BALLOONS) ×1 IMPLANT
CANNULA 5F STIFF (CANNULA) ×2 IMPLANT
CATH BEACON 5 .035 40 KMP TP (CATHETERS) ×1 IMPLANT
CATH BEACON 5 .038 40 KMP TP (CATHETERS) ×1
CATH EMBOLECTOMY 5FR (BALLOONS) ×2 IMPLANT
DEVICE PRESTO INFLATION (MISCELLANEOUS) ×2 IMPLANT
DRAPE BRACHIAL (DRAPES) ×2 IMPLANT
NEEDLE ENTRY 21GA 7CM ECHOTIP (NEEDLE) ×2 IMPLANT
PACK ANGIOGRAPHY (CUSTOM PROCEDURE TRAY) ×2 IMPLANT
SET AVX THROMB ULT (MISCELLANEOUS) ×2 IMPLANT
SET INTRO CAPELLA COAXIAL (SET/KITS/TRAYS/PACK) ×2 IMPLANT
SHEATH BRITE TIP 6FRX5.5 (SHEATH) ×4 IMPLANT
SHEATH BRITE TIP 7FRX11 (SHEATH) IMPLANT
SHEATH BRITE TIP 7FRX5.5 (SHEATH) ×2 IMPLANT
STENT VIABAHN 8X150X120 (Permanent Stent) ×1 IMPLANT
STENT VIABAHN 8X15X120 7FR (Permanent Stent) ×1 IMPLANT
SUT MNCRL AB 4-0 PS2 18 (SUTURE) ×2 IMPLANT
SYR MEDRAD MARK 7 150ML (SYRINGE) ×2 IMPLANT
TUBING CONTRAST HIGH PRESS 72 (TUBING) ×2 IMPLANT
WIRE G 018X200 V18 (WIRE) ×2 IMPLANT
WIRE J 3MM .035X145CM (WIRE) ×2 IMPLANT
WIRE MAGIC TOR.035 180C (WIRE) ×4 IMPLANT

## 2019-07-01 NOTE — Progress Notes (Signed)
Pt. Here for right upper arm fistula declot. Fistula at present : Neg. Thrill, Neg. Bruit. Pt. Present and aware of procedure for today. Assmt. Completed.

## 2019-07-01 NOTE — H&P (Signed)
[facility] History & Physical Update  The patient was interviewed and re-examined.  The patient's previous History and Physical has been reviewed and is unchanged.  There is no change in the plan of care. We plan to proceed with the scheduled procedure. See Dr. Lianne Moris consult note from 9/4 for full details  Leotis Pain, MD  07/01/2019, 12:50 PM

## 2019-07-01 NOTE — Op Note (Signed)
[facility]    OPERATIVE NOTE   PROCEDURE: 1.  Right brachial artery to axillary vein arteriovenous graft cannulation under ultrasound guidance in both a retrograde and then antegrade fashion crossing 2.  Right arm shuntogram and central venogram 3.  Catheter directed thrombolysis with 4 mg of TPA delivered with the AngioJet AVX catheter 4.  Mechanical rheolytic thrombectomy to the right brachial artery to axillary vein AV graft with the AngioJet AVX catheter 5.  Fogarty embolectomy for residual arterial plug 6.  Percutaneous transluminal angioplasty of right innominate vein with 10 mm diameter angioplasty balloon 7.  Percutaneous transluminal angioplasty of the mid and distal graft and venous anastomosis with 7 mm diameter angioplasty balloon 8.  Covered stent placement to the mid and distal graft and venous anastomosis with an 8 mm diameter by 15 cm length Viabahn stent  PRE-OPERATIVE DIAGNOSIS: 1. ESRD 2.  Thrombosed right brachial artery to axillary vein arteriovenous graft  POST-OPERATIVE DIAGNOSIS: same as above   SURGEON: Leotis Pain, MD  ANESTHESIA: local with Moderate Conscious Sedation for approximately 30 minutes using 3 mg of Versed and 75 mcg of Fentanyl  ESTIMATED BLOOD LOSS: 10 cc  FINDING(S): 1. none  SPECIMEN(S):  None  CONTRAST: 40 cc  FLUORO TIME:  7.8 minutes  INDICATIONS: Patient is a 63 y.o.male who presents with a thrombosed right brachial artery to axillary vein arteriovenous graft.  The patient is scheduled for an attempted declot and shuntogram.  The patient is aware the risks include but are not limited to: bleeding, infection, thrombosis of the cannulated access, and possible anaphylactic reaction to the contrast.  The patient is aware of the risks of the procedure and elects to proceed forward.  DESCRIPTION: After full informed written consent was obtained, the patient was brought back to the angiography suite and placed  supine upon the angiography table.  The patient was connected to monitoring equipment. Moderate conscious sedation was administered with a face to face encounter with the patient throughout the procedure with my supervision of the RN administering medicines and monitoring the patient's vital signs, pulse oximetry, telemetry and mental status throughout from the start of the procedure until the patient was taken to the recovery room. The right arm was prepped and draped in the standard fashion for a percutaneous access intervention.  Under ultrasound guidance, the right brachial artery to axillary vein arteriovenous graft was cannulated with a micropuncture needle under direct ultrasound guidance due to the pulseless nature of the graft in both an antegrade and a retrograde fashion crossing, and permanent images were performed.  The microwire was advanced and the needle was exchanged for the a microsheath.  I then upsized to a 6 Fr Sheath and imaging was performed.  Hand injections were completed to image the access including the central venous system. This demonstrated no flow within the AV graft.  Based on the images, this patient will need extensive treatment to salvage the graft. I then gave the patient 4000 units of intravenous heparin.  I then placed a Magic torque wire into the brachial artery from the retrograde sheath and into the axillary vein from the antegrade sheath. 4 mg of TPA were deployed throughout the entirety of the graft and into the axillary vein. This was allowed to dwell. Mechanical rheolytic thrombectomy was then performed throughout the graft and into the axillary vein. This uncovered severe stenosis in the mid graft of 90%, moderate stenosis at the venous anastomosis in the 50-60% range, and 65  to 70% stenosis of the right innominate vein.  A residual arterial plug was also seen at the arterial anastomosis. An attempt to clear the arterial plug was done with 2 passes of the Fogarty  embolectomy balloon. This resulted in resolution of the arterial plug, and clearance of the arterial side of the graft. The arterial outflow was seen to be intact as well on these images. The retrograde sheath was removed. I then turned my attention to the thrombus in the distal graft and the axillary vein. Mechanical rheolytic thrombectomy was performed. This resulted in minimal improvement.  I then elected to treat this with a 7 mm diameter by 10 cm length high-pressure angioplasty balloon inflated twice first in the mid to distal graft and then across the venous anastomosis.  The first inflation was to 16 atm and the second inflation was to 12 atm.  For the central venous lesion, a 10 mm diameter by 6 cm length angioplasty balloon was inflated to 10 atm for 1 minute.  Completion imaging showed residual stenosis and thrombus particularly in the mid and distal graft so I elected to place a covered stent in this area.  The central venous lesion had only about a 20% residual stenosis in the right innominate vein.  We upsized to a 7 [place] sheath and a 0.018 wire and then placed an 8 mm diameter by 15 cm length via bond stent from the mid graft across the venous anastomosis.  This was postdilated with an 8 mm balloon with excellent angiographic completion result and less than 10% residual stenosis.    Based on the completion imaging, no further intervention is necessary.  The wire and balloon were removed from the sheath.  A 4-0 Monocryl purse-string suture was sewn around the sheath.  The sheath was removed while tying down the suture.  A sterile bandage was applied to the puncture site.  COMPLICATIONS: None  CONDITION: Stable   Leotis Pain 07/01/2019 1:43 PM   This note was created with Dragon Medical transcription system. Any errors in dictation are purely unintentional.

## 2019-07-02 ENCOUNTER — Other Ambulatory Visit: Payer: Self-pay | Admitting: Internal Medicine

## 2019-07-02 ENCOUNTER — Encounter: Payer: Self-pay | Admitting: Vascular Surgery

## 2019-07-03 DIAGNOSIS — N2581 Secondary hyperparathyroidism of renal origin: Secondary | ICD-10-CM | POA: Diagnosis not present

## 2019-07-03 DIAGNOSIS — N186 End stage renal disease: Secondary | ICD-10-CM | POA: Diagnosis not present

## 2019-07-03 DIAGNOSIS — Z992 Dependence on renal dialysis: Secondary | ICD-10-CM | POA: Diagnosis not present

## 2019-07-03 DIAGNOSIS — Z23 Encounter for immunization: Secondary | ICD-10-CM | POA: Diagnosis not present

## 2019-07-06 DIAGNOSIS — N2581 Secondary hyperparathyroidism of renal origin: Secondary | ICD-10-CM | POA: Diagnosis not present

## 2019-07-06 DIAGNOSIS — N186 End stage renal disease: Secondary | ICD-10-CM | POA: Diagnosis not present

## 2019-07-06 DIAGNOSIS — Z23 Encounter for immunization: Secondary | ICD-10-CM | POA: Diagnosis not present

## 2019-07-06 DIAGNOSIS — Z992 Dependence on renal dialysis: Secondary | ICD-10-CM | POA: Diagnosis not present

## 2019-07-08 ENCOUNTER — Telehealth (INDEPENDENT_AMBULATORY_CARE_PROVIDER_SITE_OTHER): Payer: Self-pay

## 2019-07-08 DIAGNOSIS — Z23 Encounter for immunization: Secondary | ICD-10-CM | POA: Diagnosis not present

## 2019-07-08 DIAGNOSIS — Z992 Dependence on renal dialysis: Secondary | ICD-10-CM | POA: Diagnosis not present

## 2019-07-08 DIAGNOSIS — N186 End stage renal disease: Secondary | ICD-10-CM | POA: Diagnosis not present

## 2019-07-08 DIAGNOSIS — N2581 Secondary hyperparathyroidism of renal origin: Secondary | ICD-10-CM | POA: Diagnosis not present

## 2019-07-08 NOTE — Telephone Encounter (Signed)
A fax was received from [facility] [place]) regarding the patient having a permcath removal. Patient is scheduled with Dr. Delana Meyer on 07/14/2019 with a 12:15 pm arrival time to the [facility]. Patient will do his Covid testing on 07/10/2019 between 12:30-2:30 pm at the [facility]. The pre-procedure instructions and appts were faxed back to [place],  attention [facility].

## 2019-07-10 ENCOUNTER — Other Ambulatory Visit
Admission: RE | Admit: 2019-07-10 | Discharge: 2019-07-10 | Disposition: A | Discharge status: Home / Self Care | Payer: Medicare HMO | Source: Ambulatory Visit | Attending: Vascular Surgery | Admitting: Vascular Surgery

## 2019-07-10 ENCOUNTER — Other Ambulatory Visit: Payer: Self-pay

## 2019-07-10 DIAGNOSIS — Z992 Dependence on renal dialysis: Secondary | ICD-10-CM | POA: Diagnosis not present

## 2019-07-10 DIAGNOSIS — Z20828 Contact with and (suspected) exposure to other viral communicable diseases: Secondary | ICD-10-CM | POA: Diagnosis not present

## 2019-07-10 DIAGNOSIS — Z01812 Encounter for preprocedural laboratory examination: Secondary | ICD-10-CM | POA: Insufficient documentation

## 2019-07-10 DIAGNOSIS — Z23 Encounter for immunization: Secondary | ICD-10-CM | POA: Diagnosis not present

## 2019-07-10 DIAGNOSIS — N2581 Secondary hyperparathyroidism of renal origin: Secondary | ICD-10-CM | POA: Diagnosis not present

## 2019-07-10 DIAGNOSIS — N186 End stage renal disease: Secondary | ICD-10-CM | POA: Diagnosis not present

## 2019-07-10 LAB — SARS CORONAVIRUS 2 (TAT 6-24 HRS): SARS Coronavirus 2: NEGATIVE

## 2019-07-13 ENCOUNTER — Other Ambulatory Visit (INDEPENDENT_AMBULATORY_CARE_PROVIDER_SITE_OTHER): Payer: Self-pay | Admitting: Nurse Practitioner

## 2019-07-13 DIAGNOSIS — N186 End stage renal disease: Secondary | ICD-10-CM | POA: Diagnosis not present

## 2019-07-13 DIAGNOSIS — Z23 Encounter for immunization: Secondary | ICD-10-CM | POA: Diagnosis not present

## 2019-07-13 DIAGNOSIS — N2581 Secondary hyperparathyroidism of renal origin: Secondary | ICD-10-CM | POA: Diagnosis not present

## 2019-07-13 DIAGNOSIS — Z992 Dependence on renal dialysis: Secondary | ICD-10-CM | POA: Diagnosis not present

## 2019-07-14 ENCOUNTER — Ambulatory Visit
Admission: RE | Admit: 2019-07-14 | Discharge: 2019-07-14 | Disposition: A | Discharge status: Home / Self Care | Payer: Medicare HMO | Attending: Vascular Surgery | Admitting: Vascular Surgery

## 2019-07-14 ENCOUNTER — Other Ambulatory Visit: Payer: Self-pay

## 2019-07-14 ENCOUNTER — Encounter
Admission: RE | Disposition: A | Discharge status: Home / Self Care | Payer: Self-pay | Source: Home / Self Care | Attending: Vascular Surgery

## 2019-07-14 DIAGNOSIS — T80211A Bloodstream infection due to central venous catheter, initial encounter: Secondary | ICD-10-CM | POA: Insufficient documentation

## 2019-07-14 DIAGNOSIS — T827XXA Infection and inflammatory reaction due to other cardiac and vascular devices, implants and grafts, initial encounter: Secondary | ICD-10-CM | POA: Diagnosis present

## 2019-07-14 DIAGNOSIS — A419 Sepsis, unspecified organism: Secondary | ICD-10-CM | POA: Insufficient documentation

## 2019-07-14 DIAGNOSIS — Y828 Other medical devices associated with adverse incidents: Secondary | ICD-10-CM | POA: Diagnosis not present

## 2019-07-14 DIAGNOSIS — N186 End stage renal disease: Secondary | ICD-10-CM | POA: Diagnosis not present

## 2019-07-14 DIAGNOSIS — T82898A Other specified complication of vascular prosthetic devices, implants and grafts, initial encounter: Secondary | ICD-10-CM | POA: Diagnosis not present

## 2019-07-14 DIAGNOSIS — N185 Chronic kidney disease, stage 5: Secondary | ICD-10-CM | POA: Diagnosis not present

## 2019-07-14 HISTORY — PX: DIALYSIS/PERMA CATHETER REMOVAL: CATH118289

## 2019-07-14 SURGERY — DIALYSIS/PERMA CATHETER REMOVAL
Anesthesia: LOCAL

## 2019-07-14 MED ORDER — LIDOCAINE-EPINEPHRINE (PF) 1 %-1:200000 IJ SOLN
INTRAMUSCULAR | Status: DC | PRN
  Administered 2019-07-14: 20 mL via INTRADERMAL

## 2019-07-14 SURGICAL SUPPLY — 2 items
FORCEPS HALSTEAD CVD 5IN STRL (INSTRUMENTS) ×2 IMPLANT
TRAY LACERAT/PLASTIC (MISCELLANEOUS) ×2 IMPLANT

## 2019-07-14 NOTE — Discharge Instructions (Signed)
Tunneled Catheter Removal, Care After °Refer to this sheet in the next few weeks. These instructions provide you with information about caring for yourself after your procedure. Your health care provider may also give you more specific instructions. Your treatment has been planned according to current medical practices, but problems sometimes occur. Call your health care provider if you have any problems or questions after your procedure. °What can I expect after the procedure? °After the procedure, it is common to have: °· Some mild redness, swelling, and pain around your catheter site. ° ° °Follow these instructions at home: °Incision care  °· Check your removal site  every day for signs of infection. Check for: °¨ More redness, swelling, or pain. °¨ More fluid or blood. °¨ Warmth. °¨ Pus or a bad smell. °· Follow instructions from your health care provider about how to take care of your removal site. Make sure you: °¨ Wash your hands with soap and water before you change your bandages (dressings). If soap and water are not available, use hand sanitizer. °Activity  °· Return to your normal activities as told by your health care provider. Ask your health care provider what activities are safe for you. °· Do not lift anything that is heavier than 10 lb (4.5 kg) for 3 weeks or as long as told by your health care provider. ° °Contact a health care provider if: °· You have more fluid or blood coming from your removal site °· You have more redness, swelling, or pain at your incisions or around the area where your catheter was removed °· Your removal site feel warm to the touch. °· You feel unusually weak. °· You feel nauseous.. °· Get help right away if °· You have swelling in your arm, shoulder, neck, or face. °· You develop chest pain. °· You have difficulty breathing. °· You feel dizzy or light-headed. °· You have pus or a bad smell coming from your removal site °· You have a fever. °· You develop bleeding from your  removal site, and your bleeding does not stop. °This information is not intended to replace advice given to you by your health care provider. Make sure you discuss any questions you have with your health care provider. °Document Released: 09/24/2012 Document Revised: 06/10/2016 Document Reviewed: 07/04/2015 °Elsevier Interactive Patient Education © 2017 [employer] ° °

## 2019-07-14 NOTE — Op Note (Signed)
  OPERATIVE NOTE   PROCEDURE: 1. Removal of a left IJ tunneled dialysis catheter  PRE-OPERATIVE DIAGNOSIS: Complication of dialysis catheter, End stage renal disease  POST-OPERATIVE DIAGNOSIS: Same  SURGEON: Hortencia Pilar, M.D.  ASSISTANT: Ms. Hezzie Bump  ANESTHESIA: Local anesthetic with 1% lidocaine with epinephrine   ESTIMATED BLOOD LOSS: Minimal   FINDING(S): 1. Catheter intact   SPECIMEN(S):  Catheter  INDICATIONS:   IMER FOXWORTH is a 63 y.o. male who presents with sepsis secondary to the tunneled dialysis catheter.  Catheter requires removal.  Risks and benefits of been reviewed all questions answered patient has agreed to proceed.     DESCRIPTION: After obtaining full informed written consent, the patient was positioned supine.   A first assistant is required in order to allow for a safe and more efficient operation.  Duties include retraction of tissues to allow for optimal exposure, assisting with suture ligation of vessels as well as maintaining a clear field of view with suction as needed.  Further duties include assisting with patient positioning during the surgery as well as wound closure.  I believe that this procedure requires a first assistant in order for it to be performed at a level in keeping with the high standards of this institution.  The left IJ catheter and surrounding area is prepped and draped in a sterile fashion. The cuff was localized by palpation and noted to be less than 3 cm from the exit site. After appropriate timeout is called, 1% lidocaine with epinephrine is infiltrated into the surrounding tissues around the cuff. Small transverse incision is created at the exit site with an 11 blade scalpel and the dissection was carried up along the catheter to expose the cuff of the tunneled catheter.  The catheter cuff is then freed from the surrounding attachments and adhesions. Once the catheter has been freed circumferentially it is removed in 1  piece. Light pressure was held at the base of the neck.   Antibiotic ointment and a sterile dressing is applied to the exit site. Patient tolerated procedure well and there were no complications.  COMPLICATIONS: None  CONDITION: Unchanged  Hortencia Pilar, M.D.  [facility] Office: [phone]  07/14/2019,6:20 PM

## 2019-07-15 ENCOUNTER — Encounter: Payer: Self-pay | Admitting: Vascular Surgery

## 2019-07-15 DIAGNOSIS — Z992 Dependence on renal dialysis: Secondary | ICD-10-CM | POA: Diagnosis not present

## 2019-07-15 DIAGNOSIS — N186 End stage renal disease: Secondary | ICD-10-CM | POA: Diagnosis not present

## 2019-07-15 DIAGNOSIS — N2581 Secondary hyperparathyroidism of renal origin: Secondary | ICD-10-CM | POA: Diagnosis not present

## 2019-07-15 DIAGNOSIS — Z23 Encounter for immunization: Secondary | ICD-10-CM | POA: Diagnosis not present

## 2019-07-17 DIAGNOSIS — Z992 Dependence on renal dialysis: Secondary | ICD-10-CM | POA: Diagnosis not present

## 2019-07-17 DIAGNOSIS — N2581 Secondary hyperparathyroidism of renal origin: Secondary | ICD-10-CM | POA: Diagnosis not present

## 2019-07-17 DIAGNOSIS — Z23 Encounter for immunization: Secondary | ICD-10-CM | POA: Diagnosis not present

## 2019-07-17 DIAGNOSIS — N186 End stage renal disease: Secondary | ICD-10-CM | POA: Diagnosis not present

## 2019-07-20 DIAGNOSIS — Z992 Dependence on renal dialysis: Secondary | ICD-10-CM | POA: Diagnosis not present

## 2019-07-20 DIAGNOSIS — N2581 Secondary hyperparathyroidism of renal origin: Secondary | ICD-10-CM | POA: Diagnosis not present

## 2019-07-20 DIAGNOSIS — N186 End stage renal disease: Secondary | ICD-10-CM | POA: Diagnosis not present

## 2019-07-20 DIAGNOSIS — Z23 Encounter for immunization: Secondary | ICD-10-CM | POA: Diagnosis not present

## 2019-07-22 DIAGNOSIS — N2581 Secondary hyperparathyroidism of renal origin: Secondary | ICD-10-CM | POA: Diagnosis not present

## 2019-07-22 DIAGNOSIS — Z992 Dependence on renal dialysis: Secondary | ICD-10-CM | POA: Diagnosis not present

## 2019-07-22 DIAGNOSIS — Z23 Encounter for immunization: Secondary | ICD-10-CM | POA: Diagnosis not present

## 2019-07-22 DIAGNOSIS — N186 End stage renal disease: Secondary | ICD-10-CM | POA: Diagnosis not present

## 2019-07-23 DIAGNOSIS — Z992 Dependence on renal dialysis: Secondary | ICD-10-CM | POA: Diagnosis not present

## 2019-07-23 DIAGNOSIS — N186 End stage renal disease: Secondary | ICD-10-CM | POA: Diagnosis not present

## 2019-07-23 DIAGNOSIS — N2581 Secondary hyperparathyroidism of renal origin: Secondary | ICD-10-CM | POA: Diagnosis not present

## 2019-07-24 DIAGNOSIS — N186 End stage renal disease: Secondary | ICD-10-CM | POA: Diagnosis not present

## 2019-07-24 DIAGNOSIS — Z992 Dependence on renal dialysis: Secondary | ICD-10-CM | POA: Diagnosis not present

## 2019-07-24 DIAGNOSIS — N2581 Secondary hyperparathyroidism of renal origin: Secondary | ICD-10-CM | POA: Diagnosis not present

## 2019-07-27 DIAGNOSIS — Z992 Dependence on renal dialysis: Secondary | ICD-10-CM | POA: Diagnosis not present

## 2019-07-27 DIAGNOSIS — N186 End stage renal disease: Secondary | ICD-10-CM | POA: Diagnosis not present

## 2019-07-27 DIAGNOSIS — N2581 Secondary hyperparathyroidism of renal origin: Secondary | ICD-10-CM | POA: Diagnosis not present

## 2019-07-29 DIAGNOSIS — N2581 Secondary hyperparathyroidism of renal origin: Secondary | ICD-10-CM | POA: Diagnosis not present

## 2019-07-29 DIAGNOSIS — N186 End stage renal disease: Secondary | ICD-10-CM | POA: Diagnosis not present

## 2019-07-29 DIAGNOSIS — Z992 Dependence on renal dialysis: Secondary | ICD-10-CM | POA: Diagnosis not present

## 2019-07-31 DIAGNOSIS — N186 End stage renal disease: Secondary | ICD-10-CM | POA: Diagnosis not present

## 2019-07-31 DIAGNOSIS — Z992 Dependence on renal dialysis: Secondary | ICD-10-CM | POA: Diagnosis not present

## 2019-07-31 DIAGNOSIS — N2581 Secondary hyperparathyroidism of renal origin: Secondary | ICD-10-CM | POA: Diagnosis not present

## 2019-08-03 DIAGNOSIS — N2581 Secondary hyperparathyroidism of renal origin: Secondary | ICD-10-CM | POA: Diagnosis not present

## 2019-08-03 DIAGNOSIS — Z794 Long term (current) use of insulin: Secondary | ICD-10-CM | POA: Diagnosis not present

## 2019-08-03 DIAGNOSIS — N186 End stage renal disease: Secondary | ICD-10-CM | POA: Diagnosis not present

## 2019-08-03 DIAGNOSIS — E119 Type 2 diabetes mellitus without complications: Secondary | ICD-10-CM | POA: Diagnosis not present

## 2019-08-03 DIAGNOSIS — Z992 Dependence on renal dialysis: Secondary | ICD-10-CM | POA: Diagnosis not present

## 2019-08-05 DIAGNOSIS — N2581 Secondary hyperparathyroidism of renal origin: Secondary | ICD-10-CM | POA: Diagnosis not present

## 2019-08-05 DIAGNOSIS — N186 End stage renal disease: Secondary | ICD-10-CM | POA: Diagnosis not present

## 2019-08-05 DIAGNOSIS — Z992 Dependence on renal dialysis: Secondary | ICD-10-CM | POA: Diagnosis not present

## 2019-08-06 ENCOUNTER — Other Ambulatory Visit (INDEPENDENT_AMBULATORY_CARE_PROVIDER_SITE_OTHER): Payer: Self-pay | Admitting: Vascular Surgery

## 2019-08-06 DIAGNOSIS — N186 End stage renal disease: Secondary | ICD-10-CM

## 2019-08-06 DIAGNOSIS — Z95820 Peripheral vascular angioplasty status with implants and grafts: Secondary | ICD-10-CM

## 2019-08-07 DIAGNOSIS — Z992 Dependence on renal dialysis: Secondary | ICD-10-CM | POA: Diagnosis not present

## 2019-08-07 DIAGNOSIS — N186 End stage renal disease: Secondary | ICD-10-CM | POA: Diagnosis not present

## 2019-08-07 DIAGNOSIS — N2581 Secondary hyperparathyroidism of renal origin: Secondary | ICD-10-CM | POA: Diagnosis not present

## 2019-08-10 DIAGNOSIS — N2581 Secondary hyperparathyroidism of renal origin: Secondary | ICD-10-CM | POA: Diagnosis not present

## 2019-08-10 DIAGNOSIS — Z992 Dependence on renal dialysis: Secondary | ICD-10-CM | POA: Diagnosis not present

## 2019-08-10 DIAGNOSIS — N186 End stage renal disease: Secondary | ICD-10-CM | POA: Diagnosis not present

## 2019-08-11 ENCOUNTER — Other Ambulatory Visit: Payer: Self-pay

## 2019-08-11 ENCOUNTER — Ambulatory Visit (INDEPENDENT_AMBULATORY_CARE_PROVIDER_SITE_OTHER): Payer: Medicare HMO | Admitting: Vascular Surgery

## 2019-08-11 ENCOUNTER — Encounter: Payer: Self-pay | Admitting: Optometry

## 2019-08-11 ENCOUNTER — Ambulatory Visit (INDEPENDENT_AMBULATORY_CARE_PROVIDER_SITE_OTHER): Payer: Medicare HMO

## 2019-08-11 ENCOUNTER — Encounter (INDEPENDENT_AMBULATORY_CARE_PROVIDER_SITE_OTHER): Payer: Self-pay | Admitting: Vascular Surgery

## 2019-08-11 VITALS — BP 158/64 | HR 60 | Resp 16 | Wt 260.6 lb

## 2019-08-11 DIAGNOSIS — E1122 Type 2 diabetes mellitus with diabetic chronic kidney disease: Secondary | ICD-10-CM | POA: Diagnosis not present

## 2019-08-11 DIAGNOSIS — I10 Essential (primary) hypertension: Secondary | ICD-10-CM

## 2019-08-11 DIAGNOSIS — Z992 Dependence on renal dialysis: Secondary | ICD-10-CM

## 2019-08-11 DIAGNOSIS — N186 End stage renal disease: Secondary | ICD-10-CM | POA: Diagnosis not present

## 2019-08-11 DIAGNOSIS — Z95820 Peripheral vascular angioplasty status with implants and grafts: Secondary | ICD-10-CM | POA: Diagnosis not present
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
## 2019-08-11 NOTE — Assessment & Plan Note (Signed)
Likely an underlying cause of his renal failure and.jdbp

## 2019-08-11 NOTE — Assessment & Plan Note (Signed)
Duplex today shows some mildly elevated velocities near the arterial anastomosis of the right arm brachial artery to axillary vein AV graft but there is good flow volume and no high-grade stenosis. At this point, no intervention is appropriate on the access and that she continue using it for his dialysis access needs.  I will plan to see him back in about 6 months with a follow-up or sooner if problems develop in the interim.

## 2019-08-11 NOTE — Assessment & Plan Note (Signed)
Likely an underlying cause of his renal failure and blood glucose control important in reducing the progression of atherosclerotic disease. Also, involved in wound healing. On appropriate medications.  

## 2019-08-12 DIAGNOSIS — N186 End stage renal disease: Secondary | ICD-10-CM | POA: Diagnosis not present

## 2019-08-12 DIAGNOSIS — Z992 Dependence on renal dialysis: Secondary | ICD-10-CM | POA: Diagnosis not present

## 2019-08-12 DIAGNOSIS — N2581 Secondary hyperparathyroidism of renal origin: Secondary | ICD-10-CM | POA: Diagnosis not present

## 2019-08-14 DIAGNOSIS — Z992 Dependence on renal dialysis: Secondary | ICD-10-CM | POA: Diagnosis not present

## 2019-08-14 DIAGNOSIS — N2581 Secondary hyperparathyroidism of renal origin: Secondary | ICD-10-CM | POA: Diagnosis not present

## 2019-08-14 DIAGNOSIS — N186 End stage renal disease: Secondary | ICD-10-CM | POA: Diagnosis not present

## 2019-08-17 DIAGNOSIS — Z992 Dependence on renal dialysis: Secondary | ICD-10-CM | POA: Diagnosis not present

## 2019-08-17 DIAGNOSIS — N186 End stage renal disease: Secondary | ICD-10-CM | POA: Diagnosis not present

## 2019-08-17 DIAGNOSIS — N2581 Secondary hyperparathyroidism of renal origin: Secondary | ICD-10-CM | POA: Diagnosis not present

## 2019-08-19 ENCOUNTER — Other Ambulatory Visit: Payer: Self-pay | Admitting: Urology

## 2019-08-19 DIAGNOSIS — Z992 Dependence on renal dialysis: Secondary | ICD-10-CM | POA: Diagnosis not present

## 2019-08-19 DIAGNOSIS — N186 End stage renal disease: Secondary | ICD-10-CM | POA: Diagnosis not present

## 2019-08-19 DIAGNOSIS — R001 Bradycardia, unspecified: Secondary | ICD-10-CM | POA: Diagnosis not present

## 2019-08-19 DIAGNOSIS — I10 Essential (primary) hypertension: Secondary | ICD-10-CM | POA: Diagnosis not present

## 2019-08-19 DIAGNOSIS — N401 Enlarged prostate with lower urinary tract symptoms: Secondary | ICD-10-CM

## 2019-08-19 DIAGNOSIS — I5032 Chronic diastolic (congestive) heart failure: Secondary | ICD-10-CM | POA: Diagnosis not present

## 2019-08-19 DIAGNOSIS — N2581 Secondary hyperparathyroidism of renal origin: Secondary | ICD-10-CM | POA: Diagnosis not present

## 2019-08-21 DIAGNOSIS — N2581 Secondary hyperparathyroidism of renal origin: Secondary | ICD-10-CM | POA: Diagnosis not present

## 2019-08-21 DIAGNOSIS — Z992 Dependence on renal dialysis: Secondary | ICD-10-CM | POA: Diagnosis not present

## 2019-08-21 DIAGNOSIS — N186 End stage renal disease: Secondary | ICD-10-CM | POA: Diagnosis not present

## 2019-08-22 DIAGNOSIS — N186 End stage renal disease: Secondary | ICD-10-CM | POA: Diagnosis not present

## 2019-08-22 DIAGNOSIS — Z992 Dependence on renal dialysis: Secondary | ICD-10-CM | POA: Diagnosis not present

## 2019-08-23 DIAGNOSIS — N186 End stage renal disease: Secondary | ICD-10-CM | POA: Diagnosis not present

## 2019-08-23 DIAGNOSIS — N2581 Secondary hyperparathyroidism of renal origin: Secondary | ICD-10-CM | POA: Diagnosis not present

## 2019-08-23 DIAGNOSIS — Z992 Dependence on renal dialysis: Secondary | ICD-10-CM | POA: Diagnosis not present

## 2019-08-24 DIAGNOSIS — N2581 Secondary hyperparathyroidism of renal origin: Secondary | ICD-10-CM | POA: Diagnosis not present

## 2019-08-24 DIAGNOSIS — Z992 Dependence on renal dialysis: Secondary | ICD-10-CM | POA: Diagnosis not present

## 2019-08-24 DIAGNOSIS — N186 End stage renal disease: Secondary | ICD-10-CM | POA: Diagnosis not present

## 2019-08-26 DIAGNOSIS — N186 End stage renal disease: Secondary | ICD-10-CM | POA: Diagnosis not present

## 2019-08-26 DIAGNOSIS — Z992 Dependence on renal dialysis: Secondary | ICD-10-CM | POA: Diagnosis not present

## 2019-08-26 DIAGNOSIS — N2581 Secondary hyperparathyroidism of renal origin: Secondary | ICD-10-CM | POA: Diagnosis not present

## 2019-08-28 DIAGNOSIS — N186 End stage renal disease: Secondary | ICD-10-CM | POA: Diagnosis not present

## 2019-08-28 DIAGNOSIS — N2581 Secondary hyperparathyroidism of renal origin: Secondary | ICD-10-CM | POA: Diagnosis not present

## 2019-08-28 DIAGNOSIS — Z992 Dependence on renal dialysis: Secondary | ICD-10-CM | POA: Diagnosis not present

## 2019-08-31 DIAGNOSIS — N2581 Secondary hyperparathyroidism of renal origin: Secondary | ICD-10-CM | POA: Diagnosis not present

## 2019-08-31 DIAGNOSIS — Z992 Dependence on renal dialysis: Secondary | ICD-10-CM | POA: Diagnosis not present

## 2019-08-31 DIAGNOSIS — N186 End stage renal disease: Secondary | ICD-10-CM | POA: Diagnosis not present

## 2019-09-02 DIAGNOSIS — Z992 Dependence on renal dialysis: Secondary | ICD-10-CM | POA: Diagnosis not present

## 2019-09-02 DIAGNOSIS — N186 End stage renal disease: Secondary | ICD-10-CM | POA: Diagnosis not present

## 2019-09-02 DIAGNOSIS — N2581 Secondary hyperparathyroidism of renal origin: Secondary | ICD-10-CM | POA: Diagnosis not present
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
## 2019-09-03 ENCOUNTER — Other Ambulatory Visit: Payer: Self-pay

## 2019-09-03 ENCOUNTER — Encounter: Payer: Self-pay | Admitting: Urology

## 2019-09-03 ENCOUNTER — Ambulatory Visit (INDEPENDENT_AMBULATORY_CARE_PROVIDER_SITE_OTHER): Payer: Medicare HMO | Admitting: Urology

## 2019-09-03 VITALS — BP 188/64 | HR 60 | Ht 72.0 in | Wt 265.2 lb

## 2019-09-03 DIAGNOSIS — N186 End stage renal disease: Secondary | ICD-10-CM | POA: Diagnosis not present

## 2019-09-03 DIAGNOSIS — Z87898 Personal history of other specified conditions: Secondary | ICD-10-CM

## 2019-09-03 DIAGNOSIS — N138 Other obstructive and reflux uropathy: Secondary | ICD-10-CM | POA: Diagnosis not present

## 2019-09-03 DIAGNOSIS — N401 Enlarged prostate with lower urinary tract symptoms: Secondary | ICD-10-CM

## 2019-09-03 DIAGNOSIS — R3915 Urgency of urination: Secondary | ICD-10-CM

## 2019-09-03 DIAGNOSIS — N5089 Other specified disorders of the male genital organs: Secondary | ICD-10-CM | POA: Diagnosis not present

## 2019-09-03 DIAGNOSIS — Z992 Dependence on renal dialysis: Secondary | ICD-10-CM | POA: Diagnosis not present

## 2019-09-03 LAB — BLADDER SCAN AMB NON-IMAGING: Scan Result: 3

## 2019-09-03 MED ORDER — TAMSULOSIN HCL 0.4 MG PO CAPS: 0.4000 mg | ORAL_CAPSULE | Freq: Every day | ORAL | 3 refills | Status: DC

## 2019-09-04 DIAGNOSIS — Z992 Dependence on renal dialysis: Secondary | ICD-10-CM | POA: Diagnosis not present

## 2019-09-04 DIAGNOSIS — N186 End stage renal disease: Secondary | ICD-10-CM | POA: Diagnosis not present

## 2019-09-04 DIAGNOSIS — N2581 Secondary hyperparathyroidism of renal origin: Secondary | ICD-10-CM | POA: Diagnosis not present

## 2019-09-04 LAB — PSA: Prostate Specific Ag, Serum: 0.7 ng/mL (ref 0.0–4.0)

## 2019-09-07 DIAGNOSIS — Z992 Dependence on renal dialysis: Secondary | ICD-10-CM | POA: Diagnosis not present

## 2019-09-07 DIAGNOSIS — N186 End stage renal disease: Secondary | ICD-10-CM | POA: Diagnosis not present

## 2019-09-07 DIAGNOSIS — N2581 Secondary hyperparathyroidism of renal origin: Secondary | ICD-10-CM | POA: Diagnosis not present

## 2019-09-09 DIAGNOSIS — Z992 Dependence on renal dialysis: Secondary | ICD-10-CM | POA: Diagnosis not present

## 2019-09-09 DIAGNOSIS — N2581 Secondary hyperparathyroidism of renal origin: Secondary | ICD-10-CM | POA: Diagnosis not present

## 2019-09-09 DIAGNOSIS — N186 End stage renal disease: Secondary | ICD-10-CM | POA: Diagnosis not present

## 2019-09-11 DIAGNOSIS — N2581 Secondary hyperparathyroidism of renal origin: Secondary | ICD-10-CM | POA: Diagnosis not present

## 2019-09-11 DIAGNOSIS — Z992 Dependence on renal dialysis: Secondary | ICD-10-CM | POA: Diagnosis not present

## 2019-09-11 DIAGNOSIS — N186 End stage renal disease: Secondary | ICD-10-CM | POA: Diagnosis not present

## 2019-09-13 DIAGNOSIS — Z992 Dependence on renal dialysis: Secondary | ICD-10-CM | POA: Diagnosis not present

## 2019-09-13 DIAGNOSIS — N2581 Secondary hyperparathyroidism of renal origin: Secondary | ICD-10-CM | POA: Diagnosis not present

## 2019-09-13 DIAGNOSIS — N186 End stage renal disease: Secondary | ICD-10-CM | POA: Diagnosis not present

## 2019-09-15 DIAGNOSIS — Z992 Dependence on renal dialysis: Secondary | ICD-10-CM | POA: Diagnosis not present

## 2019-09-15 DIAGNOSIS — N186 End stage renal disease: Secondary | ICD-10-CM | POA: Diagnosis not present

## 2019-09-15 DIAGNOSIS — N2581 Secondary hyperparathyroidism of renal origin: Secondary | ICD-10-CM | POA: Diagnosis not present

## 2019-09-18 ENCOUNTER — Telehealth: Payer: Self-pay | Admitting: Urology

## 2019-09-18 DIAGNOSIS — Z992 Dependence on renal dialysis: Secondary | ICD-10-CM | POA: Diagnosis not present

## 2019-09-18 DIAGNOSIS — N2581 Secondary hyperparathyroidism of renal origin: Secondary | ICD-10-CM | POA: Diagnosis not present

## 2019-09-18 DIAGNOSIS — N186 End stage renal disease: Secondary | ICD-10-CM | POA: Diagnosis not present

## 2019-09-18 DIAGNOSIS — N5089 Other specified disorders of the male genital organs: Secondary | ICD-10-CM

## 2019-09-18 NOTE — Telephone Encounter (Signed)
Please let Richard Chen know that his PSA was normal at 0.7. I reviewed his records from the transplant team regarding the area in his scrotum. The CT scans showed a hydrocele which is a fluid filled sac in the scrotum. I felt a lump in his epididymis which is different. Most of the lumps in the epididymis tend to be non cancerous, but it is important we get a scrotal ultrasound to make sure it is not something more concerning.

## 2019-09-21 DIAGNOSIS — N186 End stage renal disease: Secondary | ICD-10-CM | POA: Diagnosis not present

## 2019-09-21 DIAGNOSIS — N2581 Secondary hyperparathyroidism of renal origin: Secondary | ICD-10-CM | POA: Diagnosis not present

## 2019-09-21 DIAGNOSIS — Z992 Dependence on renal dialysis: Secondary | ICD-10-CM | POA: Diagnosis not present

## 2019-09-21 NOTE — Telephone Encounter (Signed)
Patient notified and voiced understanding. Scrotal US order placed

## 2019-09-22 DIAGNOSIS — Z992 Dependence on renal dialysis: Secondary | ICD-10-CM | POA: Diagnosis not present

## 2019-09-22 DIAGNOSIS — N186 End stage renal disease: Secondary | ICD-10-CM | POA: Diagnosis not present

## 2019-09-22 DIAGNOSIS — N2581 Secondary hyperparathyroidism of renal origin: Secondary | ICD-10-CM | POA: Diagnosis not present

## 2019-09-23 DIAGNOSIS — N186 End stage renal disease: Secondary | ICD-10-CM | POA: Diagnosis not present

## 2019-09-23 DIAGNOSIS — Z992 Dependence on renal dialysis: Secondary | ICD-10-CM | POA: Diagnosis not present

## 2019-09-23 DIAGNOSIS — N2581 Secondary hyperparathyroidism of renal origin: Secondary | ICD-10-CM | POA: Diagnosis not present

## 2019-09-25 DIAGNOSIS — N2581 Secondary hyperparathyroidism of renal origin: Secondary | ICD-10-CM | POA: Diagnosis not present

## 2019-09-25 DIAGNOSIS — Z992 Dependence on renal dialysis: Secondary | ICD-10-CM | POA: Diagnosis not present

## 2019-09-25 DIAGNOSIS — N186 End stage renal disease: Secondary | ICD-10-CM | POA: Diagnosis not present

## 2019-09-28 ENCOUNTER — Other Ambulatory Visit: Payer: Self-pay | Admitting: Internal Medicine

## 2019-09-28 DIAGNOSIS — N186 End stage renal disease: Secondary | ICD-10-CM | POA: Diagnosis not present

## 2019-09-28 DIAGNOSIS — Z992 Dependence on renal dialysis: Secondary | ICD-10-CM | POA: Diagnosis not present

## 2019-09-28 DIAGNOSIS — N2581 Secondary hyperparathyroidism of renal origin: Secondary | ICD-10-CM | POA: Diagnosis not present

## 2019-09-30 DIAGNOSIS — N186 End stage renal disease: Secondary | ICD-10-CM | POA: Diagnosis not present

## 2019-09-30 DIAGNOSIS — N2581 Secondary hyperparathyroidism of renal origin: Secondary | ICD-10-CM | POA: Diagnosis not present

## 2019-09-30 DIAGNOSIS — Z992 Dependence on renal dialysis: Secondary | ICD-10-CM | POA: Diagnosis not present

## 2019-10-02 DIAGNOSIS — N2581 Secondary hyperparathyroidism of renal origin: Secondary | ICD-10-CM | POA: Diagnosis not present

## 2019-10-02 DIAGNOSIS — Z992 Dependence on renal dialysis: Secondary | ICD-10-CM | POA: Diagnosis not present

## 2019-10-02 DIAGNOSIS — N186 End stage renal disease: Secondary | ICD-10-CM | POA: Diagnosis not present

## 2019-10-03 ENCOUNTER — Non-Acute Institutional Stay
Admission: EM | Admit: 2019-10-03 | Discharge: 2019-10-03 | Disposition: A | Discharge status: Home / Self Care | Payer: Medicare HMO | Attending: Emergency Medicine | Admitting: Emergency Medicine

## 2019-10-03 ENCOUNTER — Emergency Department: Payer: Medicare HMO

## 2019-10-03 ENCOUNTER — Encounter: Payer: Self-pay | Admitting: Emergency Medicine

## 2019-10-03 ENCOUNTER — Other Ambulatory Visit: Payer: Self-pay

## 2019-10-03 DIAGNOSIS — I10 Essential (primary) hypertension: Secondary | ICD-10-CM | POA: Diagnosis not present

## 2019-10-03 DIAGNOSIS — M109 Gout, unspecified: Secondary | ICD-10-CM | POA: Insufficient documentation

## 2019-10-03 DIAGNOSIS — E785 Hyperlipidemia, unspecified: Secondary | ICD-10-CM | POA: Diagnosis not present

## 2019-10-03 DIAGNOSIS — I5032 Chronic diastolic (congestive) heart failure: Secondary | ICD-10-CM | POA: Diagnosis not present

## 2019-10-03 DIAGNOSIS — K4090 Unilateral inguinal hernia, without obstruction or gangrene, not specified as recurrent: Secondary | ICD-10-CM | POA: Diagnosis not present

## 2019-10-03 DIAGNOSIS — D631 Anemia in chronic kidney disease: Secondary | ICD-10-CM | POA: Insufficient documentation

## 2019-10-03 DIAGNOSIS — J309 Allergic rhinitis, unspecified: Secondary | ICD-10-CM | POA: Diagnosis not present

## 2019-10-03 DIAGNOSIS — R52 Pain, unspecified: Secondary | ICD-10-CM | POA: Diagnosis not present

## 2019-10-03 DIAGNOSIS — R1030 Lower abdominal pain, unspecified: Secondary | ICD-10-CM | POA: Diagnosis not present

## 2019-10-03 DIAGNOSIS — E7800 Pure hypercholesterolemia, unspecified: Secondary | ICD-10-CM | POA: Insufficient documentation

## 2019-10-03 DIAGNOSIS — E875 Hyperkalemia: Secondary | ICD-10-CM | POA: Diagnosis not present

## 2019-10-03 DIAGNOSIS — X58XXXA Exposure to other specified factors, initial encounter: Secondary | ICD-10-CM | POA: Insufficient documentation

## 2019-10-03 DIAGNOSIS — Z992 Dependence on renal dialysis: Secondary | ICD-10-CM | POA: Diagnosis not present

## 2019-10-03 DIAGNOSIS — N186 End stage renal disease: Secondary | ICD-10-CM | POA: Diagnosis not present

## 2019-10-03 DIAGNOSIS — Z79899 Other long term (current) drug therapy: Secondary | ICD-10-CM | POA: Insufficient documentation

## 2019-10-03 DIAGNOSIS — N2581 Secondary hyperparathyroidism of renal origin: Secondary | ICD-10-CM | POA: Diagnosis not present

## 2019-10-03 DIAGNOSIS — M545 Low back pain: Secondary | ICD-10-CM | POA: Insufficient documentation

## 2019-10-03 DIAGNOSIS — R1084 Generalized abdominal pain: Secondary | ICD-10-CM | POA: Diagnosis not present

## 2019-10-03 DIAGNOSIS — F419 Anxiety disorder, unspecified: Secondary | ICD-10-CM | POA: Insufficient documentation

## 2019-10-03 DIAGNOSIS — I132 Hypertensive heart and chronic kidney disease with heart failure and with stage 5 chronic kidney disease, or end stage renal disease: Secondary | ICD-10-CM | POA: Diagnosis not present

## 2019-10-03 DIAGNOSIS — S39012A Strain of muscle, fascia and tendon of lower back, initial encounter: Secondary | ICD-10-CM

## 2019-10-03 DIAGNOSIS — Z6834 Body mass index (BMI) 34.0-34.9, adult: Secondary | ICD-10-CM | POA: Diagnosis not present

## 2019-10-03 DIAGNOSIS — I120 Hypertensive chronic kidney disease with stage 5 chronic kidney disease or end stage renal disease: Secondary | ICD-10-CM | POA: Diagnosis not present

## 2019-10-03 DIAGNOSIS — M5489 Other dorsalgia: Secondary | ICD-10-CM | POA: Diagnosis not present

## 2019-10-03 DIAGNOSIS — E1122 Type 2 diabetes mellitus with diabetic chronic kidney disease: Secondary | ICD-10-CM | POA: Insufficient documentation

## 2019-10-03 DIAGNOSIS — F329 Major depressive disorder, single episode, unspecified: Secondary | ICD-10-CM | POA: Diagnosis not present

## 2019-10-03 DIAGNOSIS — R001 Bradycardia, unspecified: Secondary | ICD-10-CM | POA: Diagnosis not present

## 2019-10-03 LAB — COMPREHENSIVE METABOLIC PANEL
ALT: 17 U/L (ref 0–44)
AST: 32 U/L (ref 15–41)
Albumin: 3.6 g/dL (ref 3.5–5.0)
Alkaline Phosphatase: 72 U/L (ref 38–126)
Anion gap: 11 (ref 5–15)
BUN: 61 mg/dL — ABNORMAL HIGH (ref 8–23)
CO2: 28 mmol/L (ref 22–32)
Calcium: 8.7 mg/dL — ABNORMAL LOW (ref 8.9–10.3)
Chloride: 96 mmol/L — ABNORMAL LOW (ref 98–111)
Creatinine, Ser: 9.22 mg/dL — ABNORMAL HIGH (ref 0.61–1.24)
GFR calc Af Amer: 6 mL/min — ABNORMAL LOW (ref >60)
GFR calc non Af Amer: 5 mL/min — ABNORMAL LOW (ref >60)
Glucose, Bld: 173 mg/dL — ABNORMAL HIGH (ref 70–99)
Potassium: 5.8 mmol/L — ABNORMAL HIGH (ref 3.5–5.1)
Sodium: 135 mmol/L (ref 135–145)
Total Bilirubin: 0.9 mg/dL (ref 0.3–1.2)
Total Protein: 7.5 g/dL (ref 6.5–8.1)

## 2019-10-03 LAB — CBC WITH DIFFERENTIAL/PLATELET
Abs Immature Granulocytes: 0.02 10*3/uL (ref 0.00–0.07)
Basophils Absolute: 0 10*3/uL (ref 0.0–0.1)
Basophils Relative: 0 %
Eosinophils Absolute: 0.1 10*3/uL (ref 0.0–0.5)
Eosinophils Relative: 2 %
HCT: 32.2 % — ABNORMAL LOW (ref 39.0–52.0)
Hemoglobin: 10.1 g/dL — ABNORMAL LOW (ref 13.0–17.0)
Immature Granulocytes: 0 %
Lymphocytes Relative: 19 %
Lymphs Abs: 1.3 10*3/uL (ref 0.7–4.0)
MCH: 29.4 pg (ref 26.0–34.0)
MCHC: 31.4 g/dL (ref 30.0–36.0)
MCV: 93.9 fL (ref 80.0–100.0)
Monocytes Absolute: 0.7 10*3/uL (ref 0.1–1.0)
Monocytes Relative: 11 %
Neutro Abs: 4.6 10*3/uL (ref 1.7–7.7)
Neutrophils Relative %: 68 %
Platelets: 152 10*3/uL (ref 150–400)
RBC: 3.43 MIL/uL — ABNORMAL LOW (ref 4.22–5.81)
RDW: 14.2 % (ref 11.5–15.5)
WBC: 6.8 10*3/uL (ref 4.0–10.5)
nRBC: 0 % (ref 0.0–0.2)

## 2019-10-03 LAB — LIPASE, BLOOD: Lipase: 34 U/L (ref 11–51)

## 2019-10-03 LAB — GLUCOSE, CAPILLARY: Glucose-Capillary: 161 mg/dL — ABNORMAL HIGH (ref 70–99)

## 2019-10-03 MED ORDER — ALBUTEROL SULFATE (2.5 MG/3ML) 0.083% IN NEBU
10.0000 mg | INHALATION_SOLUTION | Freq: Once | RESPIRATORY_TRACT | Status: AC
  Administered 2019-10-03: 10 mg via RESPIRATORY_TRACT
  Filled 2019-10-03: qty 12

## 2019-10-03 MED ORDER — SODIUM ZIRCONIUM CYCLOSILICATE 5 G PO PACK
5.0000 g | PACK | Freq: Once | ORAL | Status: AC
  Administered 2019-10-03: 15:00:00 5 g via ORAL
  Filled 2019-10-03: qty 1

## 2019-10-03 MED ORDER — DEXTROSE 50 % IV SOLN
1.0000 | Freq: Once | INTRAVENOUS | Status: AC
  Administered 2019-10-03: 50 mL via INTRAVENOUS
  Filled 2019-10-03: qty 50

## 2019-10-03 MED ORDER — CYCLOBENZAPRINE HCL 5 MG PO TABS: 5.0000 mg | ORAL_TABLET | Freq: Three times a day (TID) | ORAL | 0 refills | Status: DC | PRN

## 2019-10-03 MED ORDER — CALCIUM GLUCONATE 10 % IV SOLN
1.0000 g | Freq: Once | INTRAVENOUS | Status: AC
  Administered 2019-10-03: 1 g via INTRAVENOUS
  Filled 2019-10-03: qty 10

## 2019-10-03 MED ORDER — CYCLOBENZAPRINE HCL 10 MG PO TABS
5.0000 mg | ORAL_TABLET | Freq: Once | ORAL | Status: AC
  Administered 2019-10-03: 5 mg via ORAL
  Filled 2019-10-03: qty 1

## 2019-10-03 MED ORDER — CHLORHEXIDINE GLUCONATE CLOTH 2 % EX PADS
6.0000 | MEDICATED_PAD | Freq: Every day | CUTANEOUS | Status: DC
  Filled 2019-10-03: qty 6

## 2019-10-03 MED ORDER — LIDOCAINE 5 % EX PTCH: 1.0000 | MEDICATED_PATCH | Freq: Two times a day (BID) | CUTANEOUS | 0 refills | Status: AC

## 2019-10-03 MED ORDER — LIDOCAINE 5 % EX PTCH
1.0000 | MEDICATED_PATCH | CUTANEOUS | Status: DC
  Administered 2019-10-03: 1 via TRANSDERMAL
  Filled 2019-10-03: qty 1

## 2019-10-03 MED ORDER — MORPHINE SULFATE (PF) 4 MG/ML IV SOLN
4.0000 mg | Freq: Once | INTRAVENOUS | Status: AC
  Administered 2019-10-03: 4 mg via INTRAVENOUS
  Filled 2019-10-03: qty 1

## 2019-10-03 MED ORDER — INSULIN ASPART 100 UNIT/ML IV SOLN
5.0000 [IU] | Freq: Once | INTRAVENOUS | Status: AC
  Administered 2019-10-03: 5 [IU] via INTRAVENOUS
  Filled 2019-10-03: qty 0.05

## 2019-10-03 MED ORDER — ONDANSETRON HCL 4 MG/2ML IJ SOLN
4.0000 mg | Freq: Once | INTRAMUSCULAR | Status: AC
  Administered 2019-10-03: 4 mg via INTRAVENOUS
  Filled 2019-10-03: qty 2

## 2019-10-03 NOTE — ED Triage Notes (Signed)
Pt presents to ED via [facility] from home c/o lower back pain that wraps around to bil lower abd. Pt is on dialysis, last went yesterday but only completed 1/2 treatment. Pt states he has been unable to get out of bed today d/t pain.

## 2019-10-03 NOTE — Progress Notes (Signed)
Pre HD Assessment  
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
## 2019-10-03 NOTE — Progress Notes (Signed)
HD TX Completed    10/03/19 1940  Vital Signs  Pulse Rate (!) 52  Resp 16  BP (!) 185/74  Oxygen Therapy  SpO2 99 %  O2 Device Room Air  Pain Assessment  Pain Scale 0-10  Pain Score 0  During Hemodialysis Assessment  Blood Flow Rate (mL/min) 400 mL/min  Arterial Pressure (mmHg) -200 mmHg  Venous Pressure (mmHg) 220 mmHg  Transmembrane Pressure (mmHg) 50 mmHg  Ultrafiltration Rate (mL/min) 170 mL/min  Dialysate Flow Rate (mL/min) 600 ml/min  Conductivity: Machine  14  HD Safety Checks Performed Yes  Intra-Hemodialysis Comments Progressing as prescribed

## 2019-10-03 NOTE — Progress Notes (Signed)
HD Tx Started   10/03/19 1640  Vital Signs  Pulse Rate (!) 49  Pulse Rate Source Monitor  Resp 18  BP (!) 203/54  BP Location Left Arm  BP Method Automatic  Patient Position (if appropriate) Lying  Oxygen Therapy  SpO2 98 %  O2 Device Room Air  Pain Assessment  Pain Scale 0-10  Pain Score 0  During Hemodialysis Assessment  Blood Flow Rate (mL/min) 400 mL/min  Arterial Pressure (mmHg) -200 mmHg  Venous Pressure (mmHg) 200 mmHg  Transmembrane Pressure (mmHg) 40 mmHg  Ultrafiltration Rate (mL/min) 170 mL/min  Dialysate Flow Rate (mL/min) 600 ml/min  Conductivity: Machine  13.9  HD Safety Checks Performed Yes  Dialysis Fluid Bolus Normal Saline  Bolus Amount (mL) 250 mL  Intra-Hemodialysis Comments Tx initiated

## 2019-10-03 NOTE — Progress Notes (Signed)
Post HD Assessment   10/03/19 1945  Neurological  Level of Consciousness Alert  Orientation Level Oriented X4  Respiratory  Respiratory Pattern Regular;Unlabored  Chest Assessment Chest expansion symmetrical  Bilateral Breath Sounds Diminished;Clear  Cardiac  Pulse Regular  Heart Sounds S1, S2  Jugular Venous Distention (JVD) No  ECG Monitor Yes  Cardiac Rhythm SB  Antiarrhythmic device No  Vascular  R Radial Pulse +2  L Radial Pulse +2  Integumentary  Integumentary (WDL) WDL  Musculoskeletal  Musculoskeletal (WDL) X  Generalized Weakness Yes  Gastrointestinal  Bowel Sounds Assessment Active  Last BM Date 10/02/19  GU Assessment  Genitourinary (WDL) X  Genitourinary Symptoms Oliguria (HD Pt)  Psychosocial  Psychosocial (WDL) WDL

## 2019-10-03 NOTE — Progress Notes (Signed)
Post HD Note    10/03/19 2045  Hand-Off documentation  Report given to (Full Name) ED Charge RN   Report received from (Full Name) Newt Minion   Vital Signs  Temp 99 F (37.2 C)  Temp Source Oral  Pulse Rate (!) 53  Pulse Rate Source Monitor  Resp 18  BP (!) 185/80  BP Location Left Arm  BP Method Automatic  Patient Position (if appropriate) Lying  Oxygen Therapy  SpO2 99 %  O2 Device Room Air  Pain Assessment  Pain Scale 0-10  Pain Score 0  Post-Hemodialysis Assessment  Rinseback Volume (mL) 250 mL  KECN 64.5 V  Dialyzer Clearance Lightly streaked  Duration of HD Treatment -hour(s) 3 hour(s)  Hemodialysis Intake (mL) 500 mL  UF Total -Machine (mL) 500 mL  Net UF (mL) 0 mL  Tolerated HD Treatment Yes  AVG/AVF Arterial Site Held (minutes) 7 minutes  AVG/AVF Venous Site Held (minutes) 6 minutes  Fistula / Graft Right Upper arm Arteriovenous vein graft  Placement Date/Time: 03/01/17 1232   Orientation: Right  Access Location: Upper arm  Access Type: Arteriovenous vein graft  Site Condition No complications  Fistula / Graft Assessment Present;Thrill;Bruit  Status Deaccessed  Needle Size 15  Drainage Description None

## 2019-10-03 NOTE — Plan of Care (Signed)
  Problem: Fluid Volume: Goal: Compliance with measures to maintain balanced fluid volume will improve Outcome: Progressing  UG Goal was set to zero. Pt tolerated well the Tx

## 2019-10-03 NOTE — ED Provider Notes (Signed)
9:12 PM reevaluated patient after dialysis.  Patient is feeling better and feels comfortable with being discharged home at this time  I discussed the provisional nature of ED diagnosis, the treatment so far, the ongoing plan of care, follow up appointments and return precautions with the patient and any family or support people present. They expressed understanding and agreed with the plan, discharged home.       Vanessa Linden, MD 10/03/19 2112
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
## 2019-10-03 NOTE — Progress Notes (Signed)
Pre HD Note    10/03/19 1630  Hand-Off documentation  Report given to (Full Name) Newt Minion RN   Report received from (Full Name) Alwyn Ren RN   Vital Signs  Temp 98.4 F (36.9 C)  Temp Source Oral  Pulse Rate (!) 48  Pulse Rate Source Monitor  Resp 15  BP (!) 203/54  BP Location Left Arm  BP Method Automatic  Patient Position (if appropriate) Lying  Oxygen Therapy  SpO2 100 %  O2 Device Room Air  Pain Assessment  Pain Scale 0-10  Pain Score 0  Dialysis Weight  Weight 119.7 kg  Type of Weight Pre-Dialysis  Time-Out for Hemodialysis  What Procedure? HD  Pt Identifiers(min of two) First/Last Name;MRN/Account#  Correct Site? Yes  Correct Side? Yes  Correct Procedure? Yes  Consents Verified? Yes  Safety Precautions Reviewed? Yes  Engineer, civil (consulting) Number [place] Number 2  UF/Alarm Test Passed  Conductivity: Meter 13.8  Conductivity: Machine  13.8  pH 7.2  Reverse Osmosis Main  Normal Saline Lot Number [id number]  Dialyzer Lot Number [id number]  Disposable Set Lot Number [id number]  Machine Temperature 98.6 F (37 C)  Musician and Audible Yes  Blood Lines Intact and Secured Yes  Pre Treatment Patient Checks  Vascular access used during treatment Fistula  HD catheter dressing before treatment WDL  Patient is receiving dialysis in a chair  (In Bed)  Hepatitis B Surface Antigen Results Negative  Date Hepatitis B Surface Antigen Drawn 09/29/18  Hepatitis B Surface Antibody  (<10 - 59)  Date Hepatitis B Surface Antibody Drawn 08/03/19  Hemodialysis Consent Verified Yes  Hemodialysis Standing Orders Initiated Yes  ECG (Telemetry) Monitor On Yes  Prime Ordered Normal Saline  Length of  DialysisTreatment -hour(s) 3 Hour(s)  Dialysis Treatment Comments  (Na140)  Dialyzer Elisio 17H NR  Dialysate 2K;2.5 Ca  Dialysis Anticoagulant None  Dialysate Flow Ordered 600  Blood Flow Rate Ordered 400 mL/min  Ultrafiltration Goal 0 Liters  Dialysis Blood  Pressure Support Ordered Albumin  Education / Care Plan  Dialysis Education Provided Yes  Documented Education in Care Plan Yes  Fistula / Graft Right Upper arm Arteriovenous vein graft  Placement Date/Time: 03/01/17 1232   Orientation: Right  Access Location: Upper arm  Access Type: Arteriovenous vein graft  Site Condition No complications  Fistula / Graft Assessment Present;Thrill;Bruit  Status Accessed  Needle Size 15  Drainage Description None

## 2019-10-03 NOTE — ED Notes (Signed)
Secretary to call dialysis to notify of new patient.

## 2019-10-05 DIAGNOSIS — Z992 Dependence on renal dialysis: Secondary | ICD-10-CM | POA: Diagnosis not present

## 2019-10-05 DIAGNOSIS — N186 End stage renal disease: Secondary | ICD-10-CM | POA: Diagnosis not present

## 2019-10-05 DIAGNOSIS — N2581 Secondary hyperparathyroidism of renal origin: Secondary | ICD-10-CM | POA: Diagnosis not present

## 2019-10-05 DIAGNOSIS — Z7682 Awaiting organ transplant status: Secondary | ICD-10-CM | POA: Diagnosis not present

## 2019-10-07 DIAGNOSIS — N2581 Secondary hyperparathyroidism of renal origin: Secondary | ICD-10-CM | POA: Diagnosis not present

## 2019-10-07 DIAGNOSIS — Z992 Dependence on renal dialysis: Secondary | ICD-10-CM | POA: Diagnosis not present

## 2019-10-07 DIAGNOSIS — N186 End stage renal disease: Secondary | ICD-10-CM | POA: Diagnosis not present

## 2019-10-09 ENCOUNTER — Ambulatory Visit
Admission: RE | Admit: 2019-10-09 | Discharge: 2019-10-09 | Disposition: A | Discharge status: Home / Self Care | Payer: Medicare HMO | Source: Ambulatory Visit | Attending: Urology | Admitting: Urology

## 2019-10-09 ENCOUNTER — Other Ambulatory Visit: Payer: Self-pay

## 2019-10-09 DIAGNOSIS — N433 Hydrocele, unspecified: Secondary | ICD-10-CM | POA: Diagnosis not present

## 2019-10-09 DIAGNOSIS — N186 End stage renal disease: Secondary | ICD-10-CM | POA: Diagnosis not present

## 2019-10-09 DIAGNOSIS — N5089 Other specified disorders of the male genital organs: Secondary | ICD-10-CM

## 2019-10-09 DIAGNOSIS — N2581 Secondary hyperparathyroidism of renal origin: Secondary | ICD-10-CM | POA: Diagnosis not present

## 2019-10-09 DIAGNOSIS — Z992 Dependence on renal dialysis: Secondary | ICD-10-CM | POA: Diagnosis not present

## 2019-10-11 DIAGNOSIS — N2581 Secondary hyperparathyroidism of renal origin: Secondary | ICD-10-CM | POA: Diagnosis not present

## 2019-10-11 DIAGNOSIS — Z992 Dependence on renal dialysis: Secondary | ICD-10-CM | POA: Diagnosis not present

## 2019-10-11 DIAGNOSIS — N186 End stage renal disease: Secondary | ICD-10-CM | POA: Diagnosis not present

## 2019-10-12 DIAGNOSIS — E875 Hyperkalemia: Secondary | ICD-10-CM | POA: Diagnosis not present

## 2019-10-12 DIAGNOSIS — M6281 Muscle weakness (generalized): Secondary | ICD-10-CM | POA: Diagnosis not present

## 2019-10-13 ENCOUNTER — Encounter: Payer: Self-pay | Admitting: Internal Medicine

## 2019-10-13 ENCOUNTER — Other Ambulatory Visit: Payer: Self-pay

## 2019-10-13 ENCOUNTER — Ambulatory Visit (INDEPENDENT_AMBULATORY_CARE_PROVIDER_SITE_OTHER): Payer: Medicare HMO | Admitting: Internal Medicine

## 2019-10-13 VITALS — BP 146/66 | HR 70 | Temp 97.7°F | Ht 72.5 in | Wt 262.0 lb

## 2019-10-13 DIAGNOSIS — I5032 Chronic diastolic (congestive) heart failure: Secondary | ICD-10-CM

## 2019-10-13 DIAGNOSIS — M1000 Idiopathic gout, unspecified site: Secondary | ICD-10-CM | POA: Diagnosis not present

## 2019-10-13 DIAGNOSIS — I700 Atherosclerosis of aorta: Secondary | ICD-10-CM | POA: Diagnosis not present

## 2019-10-13 DIAGNOSIS — E1121 Type 2 diabetes mellitus with diabetic nephropathy: Secondary | ICD-10-CM | POA: Diagnosis not present

## 2019-10-13 DIAGNOSIS — N2581 Secondary hyperparathyroidism of renal origin: Secondary | ICD-10-CM | POA: Diagnosis not present

## 2019-10-13 DIAGNOSIS — Z0000 Encounter for general adult medical examination without abnormal findings: Secondary | ICD-10-CM

## 2019-10-13 DIAGNOSIS — Z992 Dependence on renal dialysis: Secondary | ICD-10-CM | POA: Diagnosis not present

## 2019-10-13 DIAGNOSIS — S069X0S Unspecified intracranial injury without loss of consciousness, sequela: Secondary | ICD-10-CM

## 2019-10-13 DIAGNOSIS — N186 End stage renal disease: Secondary | ICD-10-CM | POA: Diagnosis not present

## 2019-10-13 DIAGNOSIS — F39 Unspecified mood [affective] disorder: Secondary | ICD-10-CM | POA: Diagnosis not present

## 2019-10-13 DIAGNOSIS — I10 Essential (primary) hypertension: Secondary | ICD-10-CM | POA: Diagnosis not present

## 2019-10-13 LAB — LIPID PANEL
Cholesterol: 133 mg/dL (ref 0–200)
HDL: 42.9 mg/dL (ref >39.00)
LDL Cholesterol: 65 mg/dL (ref 0–99)
NonHDL: 89.86
Total CHOL/HDL Ratio: 3
Triglycerides: 125 mg/dL (ref 0.0–149.0)
VLDL: 25 mg/dL (ref 0.0–40.0)

## 2019-10-13 LAB — HEPATIC FUNCTION PANEL
ALT: 14 U/L (ref 0–53)
AST: 13 U/L (ref 0–37)
Albumin: 3.7 g/dL (ref 3.5–5.2)
Alkaline Phosphatase: 72 U/L (ref 39–117)
Bilirubin, Direct: 0.1 mg/dL (ref 0.0–0.3)
Total Bilirubin: 0.3 mg/dL (ref 0.2–1.2)
Total Protein: 6.8 g/dL (ref 6.0–8.3)

## 2019-10-13 LAB — T4, FREE: Free T4: 1.02 ng/dL (ref 0.60–1.60)

## 2019-10-13 LAB — URIC ACID: Uric Acid, Serum: 5 mg/dL (ref 4.0–7.8)

## 2019-10-13 LAB — HEMOGLOBIN A1C: Hgb A1c MFr Bld: 6.6 % — ABNORMAL HIGH (ref 4.6–6.5)

## 2019-10-13 LAB — HM DIABETES FOOT EXAM

## 2019-10-13 NOTE — Assessment & Plan Note (Signed)
Seems to be compensated Neutral fluid status but the dialysis No changes needed

## 2019-10-13 NOTE — Assessment & Plan Note (Signed)
Chronic dysthymia Doing okay on the citalopram--need to continue

## 2019-10-13 NOTE — Assessment & Plan Note (Signed)
BMI 35 with ESRD, HTN, DM, etc Discussed healthy eating, increasing exercise

## 2019-10-13 NOTE — Assessment & Plan Note (Signed)
Doing okay Hoping for transplant

## 2019-10-13 NOTE — Assessment & Plan Note (Signed)
Seems to be well controlled Renal failure on dialysis
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
## 2019-10-13 NOTE — Assessment & Plan Note (Signed)
Noted on recent CT scan Is on statin

## 2019-10-13 NOTE — Assessment & Plan Note (Signed)
I have personally reviewed the Medicare Annual Wellness questionnaire and have noted 1. The patient's medical and social history 2. Their use of alcohol, tobacco or illicit drugs 3. Their current medications and supplements 4. The patient's functional ability including ADL's, fall risks, home safety risks and hearing or visual             impairment. 5. Diet and physical activities 6. Evidence for depression or mood disorders  The patients weight, height, BMI and visual acuity have been recorded in the chart I have made referrals, counseling and provided education to the patient based review of the above and I have provided the pt with a written personalized care plan for preventive services.  I have provided you with a copy of your personalized plan for preventive services. Please take the time to review along with your updated medication list.  Had flu vaccine Had both shingrix this year Colon due 2022 Recent PSA normal Discussed exercise
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
## 2019-10-13 NOTE — Assessment & Plan Note (Signed)
No recent exacerbations.  

## 2019-10-13 NOTE — Assessment & Plan Note (Signed)
BP Readings from Last 3 Encounters:  10/13/19 (!) 146/66  10/03/19 (!) 173/71  09/03/19 (!) 188/64   Reasonable control

## 2019-10-15 DIAGNOSIS — N2581 Secondary hyperparathyroidism of renal origin: Secondary | ICD-10-CM | POA: Diagnosis not present

## 2019-10-15 DIAGNOSIS — Z992 Dependence on renal dialysis: Secondary | ICD-10-CM | POA: Diagnosis not present

## 2019-10-15 DIAGNOSIS — N186 End stage renal disease: Secondary | ICD-10-CM | POA: Diagnosis not present

## 2019-10-19 DIAGNOSIS — Z992 Dependence on renal dialysis: Secondary | ICD-10-CM | POA: Diagnosis not present

## 2019-10-19 DIAGNOSIS — N186 End stage renal disease: Secondary | ICD-10-CM | POA: Diagnosis not present

## 2019-10-19 DIAGNOSIS — N2581 Secondary hyperparathyroidism of renal origin: Secondary | ICD-10-CM | POA: Diagnosis not present

## 2019-10-21 DIAGNOSIS — Z992 Dependence on renal dialysis: Secondary | ICD-10-CM | POA: Diagnosis not present

## 2019-10-21 DIAGNOSIS — N186 End stage renal disease: Secondary | ICD-10-CM | POA: Diagnosis not present

## 2019-10-21 DIAGNOSIS — N2581 Secondary hyperparathyroidism of renal origin: Secondary | ICD-10-CM | POA: Diagnosis not present

## 2019-10-22 DIAGNOSIS — Z992 Dependence on renal dialysis: Secondary | ICD-10-CM | POA: Diagnosis not present

## 2019-10-22 DIAGNOSIS — N186 End stage renal disease: Secondary | ICD-10-CM | POA: Diagnosis not present

## 2019-10-23 DIAGNOSIS — Z992 Dependence on renal dialysis: Secondary | ICD-10-CM | POA: Diagnosis not present

## 2019-10-23 DIAGNOSIS — N186 End stage renal disease: Secondary | ICD-10-CM | POA: Diagnosis not present

## 2019-10-23 DIAGNOSIS — N2581 Secondary hyperparathyroidism of renal origin: Secondary | ICD-10-CM | POA: Diagnosis not present

## 2019-10-26 DIAGNOSIS — Z992 Dependence on renal dialysis: Secondary | ICD-10-CM | POA: Diagnosis not present

## 2019-10-26 DIAGNOSIS — N2581 Secondary hyperparathyroidism of renal origin: Secondary | ICD-10-CM | POA: Diagnosis not present

## 2019-10-26 DIAGNOSIS — N186 End stage renal disease: Secondary | ICD-10-CM | POA: Diagnosis not present

## 2019-10-28 DIAGNOSIS — N186 End stage renal disease: Secondary | ICD-10-CM | POA: Diagnosis not present

## 2019-10-28 DIAGNOSIS — Z992 Dependence on renal dialysis: Secondary | ICD-10-CM | POA: Diagnosis not present

## 2019-10-28 DIAGNOSIS — N2581 Secondary hyperparathyroidism of renal origin: Secondary | ICD-10-CM | POA: Diagnosis not present

## 2019-10-30 DIAGNOSIS — Z992 Dependence on renal dialysis: Secondary | ICD-10-CM | POA: Diagnosis not present

## 2019-10-30 DIAGNOSIS — N186 End stage renal disease: Secondary | ICD-10-CM | POA: Diagnosis not present

## 2019-10-30 DIAGNOSIS — N2581 Secondary hyperparathyroidism of renal origin: Secondary | ICD-10-CM | POA: Diagnosis not present

## 2019-11-02 DIAGNOSIS — Z794 Long term (current) use of insulin: Secondary | ICD-10-CM | POA: Diagnosis not present

## 2019-11-02 DIAGNOSIS — N2581 Secondary hyperparathyroidism of renal origin: Secondary | ICD-10-CM | POA: Diagnosis not present

## 2019-11-02 DIAGNOSIS — Z992 Dependence on renal dialysis: Secondary | ICD-10-CM | POA: Diagnosis not present

## 2019-11-02 DIAGNOSIS — N186 End stage renal disease: Secondary | ICD-10-CM | POA: Diagnosis not present

## 2019-11-02 DIAGNOSIS — E119 Type 2 diabetes mellitus without complications: Secondary | ICD-10-CM | POA: Diagnosis not present

## 2019-11-04 DIAGNOSIS — N186 End stage renal disease: Secondary | ICD-10-CM | POA: Diagnosis not present

## 2019-11-04 DIAGNOSIS — N2581 Secondary hyperparathyroidism of renal origin: Secondary | ICD-10-CM | POA: Diagnosis not present

## 2019-11-04 DIAGNOSIS — Z992 Dependence on renal dialysis: Secondary | ICD-10-CM | POA: Diagnosis not present

## 2019-11-06 DIAGNOSIS — N186 End stage renal disease: Secondary | ICD-10-CM | POA: Diagnosis not present

## 2019-11-06 DIAGNOSIS — Z992 Dependence on renal dialysis: Secondary | ICD-10-CM | POA: Diagnosis not present

## 2019-11-06 DIAGNOSIS — N2581 Secondary hyperparathyroidism of renal origin: Secondary | ICD-10-CM | POA: Diagnosis not present

## 2019-11-09 DIAGNOSIS — N2581 Secondary hyperparathyroidism of renal origin: Secondary | ICD-10-CM | POA: Diagnosis not present

## 2019-11-09 DIAGNOSIS — Z992 Dependence on renal dialysis: Secondary | ICD-10-CM | POA: Diagnosis not present

## 2019-11-09 DIAGNOSIS — N186 End stage renal disease: Secondary | ICD-10-CM | POA: Diagnosis not present

## 2019-11-11 DIAGNOSIS — N186 End stage renal disease: Secondary | ICD-10-CM | POA: Diagnosis not present

## 2019-11-11 DIAGNOSIS — Z992 Dependence on renal dialysis: Secondary | ICD-10-CM | POA: Diagnosis not present

## 2019-11-11 DIAGNOSIS — N2581 Secondary hyperparathyroidism of renal origin: Secondary | ICD-10-CM | POA: Diagnosis not present

## 2019-11-13 DIAGNOSIS — Z992 Dependence on renal dialysis: Secondary | ICD-10-CM | POA: Diagnosis not present

## 2019-11-13 DIAGNOSIS — N2581 Secondary hyperparathyroidism of renal origin: Secondary | ICD-10-CM | POA: Diagnosis not present

## 2019-11-13 DIAGNOSIS — N186 End stage renal disease: Secondary | ICD-10-CM | POA: Diagnosis not present

## 2019-11-16 DIAGNOSIS — Z992 Dependence on renal dialysis: Secondary | ICD-10-CM | POA: Diagnosis not present

## 2019-11-16 DIAGNOSIS — N2581 Secondary hyperparathyroidism of renal origin: Secondary | ICD-10-CM | POA: Diagnosis not present

## 2019-11-16 DIAGNOSIS — N186 End stage renal disease: Secondary | ICD-10-CM | POA: Diagnosis not present

## 2019-11-18 DIAGNOSIS — Z992 Dependence on renal dialysis: Secondary | ICD-10-CM | POA: Diagnosis not present

## 2019-11-18 DIAGNOSIS — N2581 Secondary hyperparathyroidism of renal origin: Secondary | ICD-10-CM | POA: Diagnosis not present

## 2019-11-18 DIAGNOSIS — N186 End stage renal disease: Secondary | ICD-10-CM | POA: Diagnosis not present

## 2019-11-20 DIAGNOSIS — N186 End stage renal disease: Secondary | ICD-10-CM | POA: Diagnosis not present

## 2019-11-20 DIAGNOSIS — N2581 Secondary hyperparathyroidism of renal origin: Secondary | ICD-10-CM | POA: Diagnosis not present

## 2019-11-20 DIAGNOSIS — Z992 Dependence on renal dialysis: Secondary | ICD-10-CM | POA: Diagnosis not present

## 2019-11-22 DIAGNOSIS — N186 End stage renal disease: Secondary | ICD-10-CM | POA: Diagnosis not present

## 2019-11-22 DIAGNOSIS — Z992 Dependence on renal dialysis: Secondary | ICD-10-CM | POA: Diagnosis not present

## 2019-11-23 DIAGNOSIS — Z992 Dependence on renal dialysis: Secondary | ICD-10-CM | POA: Diagnosis not present

## 2019-11-23 DIAGNOSIS — N186 End stage renal disease: Secondary | ICD-10-CM | POA: Diagnosis not present

## 2019-11-23 DIAGNOSIS — N2581 Secondary hyperparathyroidism of renal origin: Secondary | ICD-10-CM | POA: Diagnosis not present

## 2019-11-25 DIAGNOSIS — N2581 Secondary hyperparathyroidism of renal origin: Secondary | ICD-10-CM | POA: Diagnosis not present

## 2019-11-25 DIAGNOSIS — Z992 Dependence on renal dialysis: Secondary | ICD-10-CM | POA: Diagnosis not present

## 2019-11-25 DIAGNOSIS — N186 End stage renal disease: Secondary | ICD-10-CM | POA: Diagnosis not present

## 2019-11-27 DIAGNOSIS — N2581 Secondary hyperparathyroidism of renal origin: Secondary | ICD-10-CM | POA: Diagnosis not present

## 2019-11-27 DIAGNOSIS — N186 End stage renal disease: Secondary | ICD-10-CM | POA: Diagnosis not present

## 2019-11-27 DIAGNOSIS — Z992 Dependence on renal dialysis: Secondary | ICD-10-CM | POA: Diagnosis not present

## 2019-11-30 DIAGNOSIS — N2581 Secondary hyperparathyroidism of renal origin: Secondary | ICD-10-CM | POA: Diagnosis not present

## 2019-11-30 DIAGNOSIS — N186 End stage renal disease: Secondary | ICD-10-CM | POA: Diagnosis not present

## 2019-11-30 DIAGNOSIS — Z992 Dependence on renal dialysis: Secondary | ICD-10-CM | POA: Diagnosis not present

## 2019-12-02 DIAGNOSIS — N186 End stage renal disease: Secondary | ICD-10-CM | POA: Diagnosis not present

## 2019-12-02 DIAGNOSIS — N2581 Secondary hyperparathyroidism of renal origin: Secondary | ICD-10-CM | POA: Diagnosis not present

## 2019-12-02 DIAGNOSIS — Z992 Dependence on renal dialysis: Secondary | ICD-10-CM | POA: Diagnosis not present

## 2019-12-04 DIAGNOSIS — N2581 Secondary hyperparathyroidism of renal origin: Secondary | ICD-10-CM | POA: Diagnosis not present

## 2019-12-04 DIAGNOSIS — Z992 Dependence on renal dialysis: Secondary | ICD-10-CM | POA: Diagnosis not present

## 2019-12-04 DIAGNOSIS — N186 End stage renal disease: Secondary | ICD-10-CM | POA: Diagnosis not present

## 2019-12-07 DIAGNOSIS — N2581 Secondary hyperparathyroidism of renal origin: Secondary | ICD-10-CM | POA: Diagnosis not present

## 2019-12-07 DIAGNOSIS — N186 End stage renal disease: Secondary | ICD-10-CM | POA: Diagnosis not present

## 2019-12-07 DIAGNOSIS — Z992 Dependence on renal dialysis: Secondary | ICD-10-CM | POA: Diagnosis not present

## 2019-12-09 DIAGNOSIS — N2581 Secondary hyperparathyroidism of renal origin: Secondary | ICD-10-CM | POA: Diagnosis not present

## 2019-12-09 DIAGNOSIS — N186 End stage renal disease: Secondary | ICD-10-CM | POA: Diagnosis not present

## 2019-12-09 DIAGNOSIS — Z992 Dependence on renal dialysis: Secondary | ICD-10-CM | POA: Diagnosis not present

## 2019-12-11 DIAGNOSIS — Z992 Dependence on renal dialysis: Secondary | ICD-10-CM | POA: Diagnosis not present

## 2019-12-11 DIAGNOSIS — N2581 Secondary hyperparathyroidism of renal origin: Secondary | ICD-10-CM | POA: Diagnosis not present

## 2019-12-11 DIAGNOSIS — N186 End stage renal disease: Secondary | ICD-10-CM | POA: Diagnosis not present

## 2019-12-14 DIAGNOSIS — N2581 Secondary hyperparathyroidism of renal origin: Secondary | ICD-10-CM | POA: Diagnosis not present

## 2019-12-14 DIAGNOSIS — Z992 Dependence on renal dialysis: Secondary | ICD-10-CM | POA: Diagnosis not present

## 2019-12-14 DIAGNOSIS — N186 End stage renal disease: Secondary | ICD-10-CM | POA: Diagnosis not present

## 2019-12-16 DIAGNOSIS — Z992 Dependence on renal dialysis: Secondary | ICD-10-CM | POA: Diagnosis not present

## 2019-12-16 DIAGNOSIS — N186 End stage renal disease: Secondary | ICD-10-CM | POA: Diagnosis not present

## 2019-12-16 DIAGNOSIS — N2581 Secondary hyperparathyroidism of renal origin: Secondary | ICD-10-CM | POA: Diagnosis not present

## 2019-12-18 DIAGNOSIS — N186 End stage renal disease: Secondary | ICD-10-CM | POA: Diagnosis not present

## 2019-12-18 DIAGNOSIS — Z992 Dependence on renal dialysis: Secondary | ICD-10-CM | POA: Diagnosis not present

## 2019-12-18 DIAGNOSIS — N2581 Secondary hyperparathyroidism of renal origin: Secondary | ICD-10-CM | POA: Diagnosis not present

## 2019-12-20 DIAGNOSIS — Z992 Dependence on renal dialysis: Secondary | ICD-10-CM | POA: Diagnosis not present

## 2019-12-20 DIAGNOSIS — N186 End stage renal disease: Secondary | ICD-10-CM | POA: Diagnosis not present

## 2019-12-21 DIAGNOSIS — N186 End stage renal disease: Secondary | ICD-10-CM | POA: Diagnosis not present

## 2019-12-21 DIAGNOSIS — N2581 Secondary hyperparathyroidism of renal origin: Secondary | ICD-10-CM | POA: Diagnosis not present

## 2019-12-21 DIAGNOSIS — Z992 Dependence on renal dialysis: Secondary | ICD-10-CM | POA: Diagnosis not present

## 2019-12-23 DIAGNOSIS — N186 End stage renal disease: Secondary | ICD-10-CM | POA: Diagnosis not present

## 2019-12-23 DIAGNOSIS — N2581 Secondary hyperparathyroidism of renal origin: Secondary | ICD-10-CM | POA: Diagnosis not present

## 2019-12-23 DIAGNOSIS — Z992 Dependence on renal dialysis: Secondary | ICD-10-CM | POA: Diagnosis not present

## 2019-12-25 DIAGNOSIS — Z992 Dependence on renal dialysis: Secondary | ICD-10-CM | POA: Diagnosis not present

## 2019-12-25 DIAGNOSIS — N186 End stage renal disease: Secondary | ICD-10-CM | POA: Diagnosis not present

## 2019-12-25 DIAGNOSIS — N2581 Secondary hyperparathyroidism of renal origin: Secondary | ICD-10-CM | POA: Diagnosis not present

## 2019-12-28 DIAGNOSIS — Z7682 Awaiting organ transplant status: Secondary | ICD-10-CM | POA: Diagnosis not present

## 2019-12-28 DIAGNOSIS — Z992 Dependence on renal dialysis: Secondary | ICD-10-CM | POA: Diagnosis not present

## 2019-12-28 DIAGNOSIS — N2581 Secondary hyperparathyroidism of renal origin: Secondary | ICD-10-CM | POA: Diagnosis not present

## 2019-12-28 DIAGNOSIS — N186 End stage renal disease: Secondary | ICD-10-CM | POA: Diagnosis not present

## 2019-12-30 DIAGNOSIS — N2581 Secondary hyperparathyroidism of renal origin: Secondary | ICD-10-CM | POA: Diagnosis not present

## 2019-12-30 DIAGNOSIS — Z992 Dependence on renal dialysis: Secondary | ICD-10-CM | POA: Diagnosis not present

## 2019-12-30 DIAGNOSIS — N186 End stage renal disease: Secondary | ICD-10-CM | POA: Diagnosis not present

## 2020-01-01 DIAGNOSIS — N2581 Secondary hyperparathyroidism of renal origin: Secondary | ICD-10-CM | POA: Diagnosis not present

## 2020-01-01 DIAGNOSIS — N186 End stage renal disease: Secondary | ICD-10-CM | POA: Diagnosis not present

## 2020-01-01 DIAGNOSIS — Z992 Dependence on renal dialysis: Secondary | ICD-10-CM | POA: Diagnosis not present

## 2020-01-04 DIAGNOSIS — N186 End stage renal disease: Secondary | ICD-10-CM | POA: Diagnosis not present

## 2020-01-04 DIAGNOSIS — Z992 Dependence on renal dialysis: Secondary | ICD-10-CM | POA: Diagnosis not present

## 2020-01-04 DIAGNOSIS — N2581 Secondary hyperparathyroidism of renal origin: Secondary | ICD-10-CM | POA: Diagnosis not present

## 2020-01-06 DIAGNOSIS — Z992 Dependence on renal dialysis: Secondary | ICD-10-CM | POA: Diagnosis not present

## 2020-01-06 DIAGNOSIS — N186 End stage renal disease: Secondary | ICD-10-CM | POA: Diagnosis not present

## 2020-01-06 DIAGNOSIS — N2581 Secondary hyperparathyroidism of renal origin: Secondary | ICD-10-CM | POA: Diagnosis not present

## 2020-01-08 DIAGNOSIS — N186 End stage renal disease: Secondary | ICD-10-CM | POA: Diagnosis not present

## 2020-01-08 DIAGNOSIS — Z992 Dependence on renal dialysis: Secondary | ICD-10-CM | POA: Diagnosis not present

## 2020-01-08 DIAGNOSIS — N2581 Secondary hyperparathyroidism of renal origin: Secondary | ICD-10-CM | POA: Diagnosis not present

## 2020-01-11 DIAGNOSIS — Z992 Dependence on renal dialysis: Secondary | ICD-10-CM | POA: Diagnosis not present

## 2020-01-11 DIAGNOSIS — N186 End stage renal disease: Secondary | ICD-10-CM | POA: Diagnosis not present

## 2020-01-11 DIAGNOSIS — N2581 Secondary hyperparathyroidism of renal origin: Secondary | ICD-10-CM | POA: Diagnosis not present

## 2020-01-13 DIAGNOSIS — N186 End stage renal disease: Secondary | ICD-10-CM | POA: Diagnosis not present

## 2020-01-13 DIAGNOSIS — N2581 Secondary hyperparathyroidism of renal origin: Secondary | ICD-10-CM | POA: Diagnosis not present

## 2020-01-13 DIAGNOSIS — Z992 Dependence on renal dialysis: Secondary | ICD-10-CM | POA: Diagnosis not present

## 2020-01-15 DIAGNOSIS — N186 End stage renal disease: Secondary | ICD-10-CM | POA: Diagnosis not present

## 2020-01-15 DIAGNOSIS — N2581 Secondary hyperparathyroidism of renal origin: Secondary | ICD-10-CM | POA: Diagnosis not present

## 2020-01-15 DIAGNOSIS — Z992 Dependence on renal dialysis: Secondary | ICD-10-CM | POA: Diagnosis not present

## 2020-01-18 DIAGNOSIS — N2581 Secondary hyperparathyroidism of renal origin: Secondary | ICD-10-CM | POA: Diagnosis not present

## 2020-01-18 DIAGNOSIS — N186 End stage renal disease: Secondary | ICD-10-CM | POA: Diagnosis not present

## 2020-01-18 DIAGNOSIS — Z992 Dependence on renal dialysis: Secondary | ICD-10-CM | POA: Diagnosis not present

## 2020-01-20 DIAGNOSIS — N2581 Secondary hyperparathyroidism of renal origin: Secondary | ICD-10-CM | POA: Diagnosis not present

## 2020-01-20 DIAGNOSIS — N186 End stage renal disease: Secondary | ICD-10-CM | POA: Diagnosis not present

## 2020-01-20 DIAGNOSIS — Z992 Dependence on renal dialysis: Secondary | ICD-10-CM | POA: Diagnosis not present

## 2020-01-21 DIAGNOSIS — N186 End stage renal disease: Secondary | ICD-10-CM | POA: Diagnosis not present

## 2020-01-21 DIAGNOSIS — Z992 Dependence on renal dialysis: Secondary | ICD-10-CM | POA: Diagnosis not present

## 2020-01-21 DIAGNOSIS — N2581 Secondary hyperparathyroidism of renal origin: Secondary | ICD-10-CM | POA: Diagnosis not present

## 2020-01-22 DIAGNOSIS — Z992 Dependence on renal dialysis: Secondary | ICD-10-CM | POA: Diagnosis not present

## 2020-01-22 DIAGNOSIS — N2581 Secondary hyperparathyroidism of renal origin: Secondary | ICD-10-CM | POA: Diagnosis not present

## 2020-01-22 DIAGNOSIS — N186 End stage renal disease: Secondary | ICD-10-CM | POA: Diagnosis not present

## 2020-01-25 DIAGNOSIS — N186 End stage renal disease: Secondary | ICD-10-CM | POA: Diagnosis not present

## 2020-01-25 DIAGNOSIS — N2581 Secondary hyperparathyroidism of renal origin: Secondary | ICD-10-CM | POA: Diagnosis not present

## 2020-01-25 DIAGNOSIS — Z992 Dependence on renal dialysis: Secondary | ICD-10-CM | POA: Diagnosis not present

## 2020-01-26 ENCOUNTER — Other Ambulatory Visit: Payer: Self-pay

## 2020-01-26 ENCOUNTER — Encounter: Payer: Self-pay | Admitting: Urology

## 2020-01-26 ENCOUNTER — Ambulatory Visit: Payer: Medicare HMO | Admitting: Urology

## 2020-01-26 VITALS — BP 189/86 | HR 59 | Ht 72.05 in | Wt 260.0 lb

## 2020-01-26 DIAGNOSIS — Z992 Dependence on renal dialysis: Secondary | ICD-10-CM | POA: Diagnosis not present

## 2020-01-26 DIAGNOSIS — R310 Gross hematuria: Secondary | ICD-10-CM

## 2020-01-26 DIAGNOSIS — Z87898 Personal history of other specified conditions: Secondary | ICD-10-CM

## 2020-01-26 DIAGNOSIS — N186 End stage renal disease: Secondary | ICD-10-CM

## 2020-01-26 DIAGNOSIS — N138 Other obstructive and reflux uropathy: Secondary | ICD-10-CM | POA: Diagnosis not present

## 2020-01-26 DIAGNOSIS — N401 Enlarged prostate with lower urinary tract symptoms: Secondary | ICD-10-CM | POA: Diagnosis not present
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
## 2020-01-27 DIAGNOSIS — Z992 Dependence on renal dialysis: Secondary | ICD-10-CM | POA: Diagnosis not present

## 2020-01-27 DIAGNOSIS — N186 End stage renal disease: Secondary | ICD-10-CM | POA: Diagnosis not present

## 2020-01-27 DIAGNOSIS — N2581 Secondary hyperparathyroidism of renal origin: Secondary | ICD-10-CM | POA: Diagnosis not present

## 2020-01-27 LAB — URINALYSIS, COMPLETE
Bilirubin, UA: NEGATIVE
Glucose, UA: NEGATIVE
Ketones, UA: NEGATIVE
Nitrite, UA: NEGATIVE
Specific Gravity, UA: 1.02 (ref 1.005–1.030)
Urobilinogen, Ur: 0.2 mg/dL (ref 0.2–1.0)
pH, UA: 8.5 — ABNORMAL HIGH (ref 5.0–7.5)

## 2020-01-27 LAB — MICROSCOPIC EXAMINATION: Bacteria, UA: NONE SEEN

## 2020-01-29 DIAGNOSIS — N2581 Secondary hyperparathyroidism of renal origin: Secondary | ICD-10-CM | POA: Diagnosis not present

## 2020-01-29 DIAGNOSIS — Z992 Dependence on renal dialysis: Secondary | ICD-10-CM | POA: Diagnosis not present

## 2020-01-29 DIAGNOSIS — N186 End stage renal disease: Secondary | ICD-10-CM | POA: Diagnosis not present

## 2020-01-30 LAB — CULTURE, URINE COMPREHENSIVE

## 2020-02-01 ENCOUNTER — Telehealth: Payer: Self-pay | Admitting: Family Medicine

## 2020-02-01 DIAGNOSIS — N2581 Secondary hyperparathyroidism of renal origin: Secondary | ICD-10-CM | POA: Diagnosis not present

## 2020-02-01 DIAGNOSIS — E119 Type 2 diabetes mellitus without complications: Secondary | ICD-10-CM | POA: Diagnosis not present

## 2020-02-01 DIAGNOSIS — N186 End stage renal disease: Secondary | ICD-10-CM | POA: Diagnosis not present

## 2020-02-01 DIAGNOSIS — Z794 Long term (current) use of insulin: Secondary | ICD-10-CM | POA: Diagnosis not present

## 2020-02-01 DIAGNOSIS — Z992 Dependence on renal dialysis: Secondary | ICD-10-CM | POA: Diagnosis not present

## 2020-02-01 MED ORDER — CEPHALEXIN 250 MG PO CAPS: ORAL_CAPSULE | ORAL | 0 refills | Status: DC

## 2020-02-01 NOTE — Telephone Encounter (Signed)
-----   Message from Nori Riis, PA-C sent at 01/31/2020  4:58 PM EDT ----- Please let Mr. Novick know that his urine culture is positive, but it is likely a skin contaminate.  We will go ahead with an antibiotic, since he is scheduled for a cystoscopy in the future.  He should take 250 mg of Keflex after his dialysis x 3 times.

## 2020-02-01 NOTE — Telephone Encounter (Signed)
Patient notified and will pick up abx and take it after dialysis 1 capsule Monday, Wednesday and Friday

## 2020-02-03 DIAGNOSIS — Z992 Dependence on renal dialysis: Secondary | ICD-10-CM | POA: Diagnosis not present

## 2020-02-03 DIAGNOSIS — N2581 Secondary hyperparathyroidism of renal origin: Secondary | ICD-10-CM | POA: Diagnosis not present

## 2020-02-03 DIAGNOSIS — N186 End stage renal disease: Secondary | ICD-10-CM | POA: Diagnosis not present

## 2020-02-05 DIAGNOSIS — N186 End stage renal disease: Secondary | ICD-10-CM | POA: Diagnosis not present

## 2020-02-05 DIAGNOSIS — N2581 Secondary hyperparathyroidism of renal origin: Secondary | ICD-10-CM | POA: Diagnosis not present

## 2020-02-05 DIAGNOSIS — Z992 Dependence on renal dialysis: Secondary | ICD-10-CM | POA: Diagnosis not present

## 2020-02-07 ENCOUNTER — Other Ambulatory Visit (INDEPENDENT_AMBULATORY_CARE_PROVIDER_SITE_OTHER): Payer: Self-pay | Admitting: Vascular Surgery

## 2020-02-07 ENCOUNTER — Inpatient Hospital Stay
Admission: EM | Admit: 2020-02-07 | Discharge: 2020-02-12 | DRG: 252 | Disposition: A | Discharge status: Home / Self Care | Payer: Medicare HMO | Attending: Internal Medicine | Admitting: Internal Medicine

## 2020-02-07 ENCOUNTER — Other Ambulatory Visit: Payer: Self-pay

## 2020-02-07 ENCOUNTER — Emergency Department: Payer: Medicare HMO

## 2020-02-07 DIAGNOSIS — R0602 Shortness of breath: Secondary | ICD-10-CM | POA: Diagnosis not present

## 2020-02-07 DIAGNOSIS — Z862 Personal history of diseases of the blood and blood-forming organs and certain disorders involving the immune mechanism: Secondary | ICD-10-CM | POA: Diagnosis not present

## 2020-02-07 DIAGNOSIS — Z992 Dependence on renal dialysis: Secondary | ICD-10-CM | POA: Diagnosis not present

## 2020-02-07 DIAGNOSIS — M109 Gout, unspecified: Secondary | ICD-10-CM | POA: Diagnosis present

## 2020-02-07 DIAGNOSIS — R0689 Other abnormalities of breathing: Secondary | ICD-10-CM | POA: Diagnosis not present

## 2020-02-07 DIAGNOSIS — N185 Chronic kidney disease, stage 5: Secondary | ICD-10-CM | POA: Diagnosis not present

## 2020-02-07 DIAGNOSIS — I160 Hypertensive urgency: Secondary | ICD-10-CM | POA: Diagnosis not present

## 2020-02-07 DIAGNOSIS — J9600 Acute respiratory failure, unspecified whether with hypoxia or hypercapnia: Secondary | ICD-10-CM | POA: Diagnosis not present

## 2020-02-07 DIAGNOSIS — E1122 Type 2 diabetes mellitus with diabetic chronic kidney disease: Secondary | ICD-10-CM | POA: Diagnosis present

## 2020-02-07 DIAGNOSIS — R52 Pain, unspecified: Secondary | ICD-10-CM | POA: Diagnosis not present

## 2020-02-07 DIAGNOSIS — T82818A Embolism of vascular prosthetic devices, implants and grafts, initial encounter: Secondary | ICD-10-CM | POA: Diagnosis present

## 2020-02-07 DIAGNOSIS — I5033 Acute on chronic diastolic (congestive) heart failure: Secondary | ICD-10-CM | POA: Diagnosis not present

## 2020-02-07 DIAGNOSIS — N186 End stage renal disease: Secondary | ICD-10-CM | POA: Diagnosis not present

## 2020-02-07 DIAGNOSIS — Z8614 Personal history of Methicillin resistant Staphylococcus aureus infection: Secondary | ICD-10-CM

## 2020-02-07 DIAGNOSIS — Y832 Surgical operation with anastomosis, bypass or graft as the cause of abnormal reaction of the patient, or of later complication, without mention of misadventure at the time of the procedure: Secondary | ICD-10-CM | POA: Diagnosis present

## 2020-02-07 DIAGNOSIS — I5031 Acute diastolic (congestive) heart failure: Secondary | ICD-10-CM | POA: Diagnosis not present

## 2020-02-07 DIAGNOSIS — J810 Acute pulmonary edema: Secondary | ICD-10-CM | POA: Diagnosis not present

## 2020-02-07 DIAGNOSIS — N2581 Secondary hyperparathyroidism of renal origin: Secondary | ICD-10-CM | POA: Diagnosis present

## 2020-02-07 DIAGNOSIS — T82868A Thrombosis of vascular prosthetic devices, implants and grafts, initial encounter: Secondary | ICD-10-CM | POA: Diagnosis not present

## 2020-02-07 DIAGNOSIS — I871 Compression of vein: Secondary | ICD-10-CM | POA: Diagnosis present

## 2020-02-07 DIAGNOSIS — D631 Anemia in chronic kidney disease: Secondary | ICD-10-CM | POA: Diagnosis not present

## 2020-02-07 DIAGNOSIS — Z886 Allergy status to analgesic agent status: Secondary | ICD-10-CM

## 2020-02-07 DIAGNOSIS — R069 Unspecified abnormalities of breathing: Secondary | ICD-10-CM | POA: Diagnosis not present

## 2020-02-07 DIAGNOSIS — Z833 Family history of diabetes mellitus: Secondary | ICD-10-CM | POA: Diagnosis not present

## 2020-02-07 DIAGNOSIS — Z20822 Contact with and (suspected) exposure to covid-19: Secondary | ICD-10-CM | POA: Diagnosis not present

## 2020-02-07 DIAGNOSIS — R001 Bradycardia, unspecified: Secondary | ICD-10-CM | POA: Diagnosis not present

## 2020-02-07 DIAGNOSIS — E785 Hyperlipidemia, unspecified: Secondary | ICD-10-CM | POA: Diagnosis present

## 2020-02-07 DIAGNOSIS — N189 Chronic kidney disease, unspecified: Secondary | ICD-10-CM | POA: Diagnosis not present

## 2020-02-07 DIAGNOSIS — Z79899 Other long term (current) drug therapy: Secondary | ICD-10-CM

## 2020-02-07 DIAGNOSIS — I132 Hypertensive heart and chronic kidney disease with heart failure and with stage 5 chronic kidney disease, or end stage renal disease: Principal | ICD-10-CM | POA: Diagnosis present

## 2020-02-07 DIAGNOSIS — Z91041 Radiographic dye allergy status: Secondary | ICD-10-CM | POA: Diagnosis not present

## 2020-02-07 DIAGNOSIS — J9601 Acute respiratory failure with hypoxia: Secondary | ICD-10-CM | POA: Diagnosis present

## 2020-02-07 DIAGNOSIS — I10 Essential (primary) hypertension: Secondary | ICD-10-CM | POA: Diagnosis not present

## 2020-02-07 DIAGNOSIS — Z841 Family history of disorders of kidney and ureter: Secondary | ICD-10-CM | POA: Diagnosis not present

## 2020-02-07 DIAGNOSIS — R000 Tachycardia, unspecified: Secondary | ICD-10-CM | POA: Diagnosis not present

## 2020-02-07 DIAGNOSIS — I161 Hypertensive emergency: Secondary | ICD-10-CM | POA: Diagnosis not present

## 2020-02-07 DIAGNOSIS — Z8249 Family history of ischemic heart disease and other diseases of the circulatory system: Secondary | ICD-10-CM | POA: Diagnosis not present

## 2020-02-07 DIAGNOSIS — Z91013 Allergy to seafood: Secondary | ICD-10-CM

## 2020-02-07 LAB — COMPREHENSIVE METABOLIC PANEL
ALT: 30 U/L (ref 0–44)
AST: 30 U/L (ref 15–41)
Albumin: 3.7 g/dL (ref 3.5–5.0)
Alkaline Phosphatase: 76 U/L (ref 38–126)
Anion gap: 11 (ref 5–15)
BUN: 54 mg/dL — ABNORMAL HIGH (ref 8–23)
CO2: 26 mmol/L (ref 22–32)
Calcium: 8.7 mg/dL — ABNORMAL LOW (ref 8.9–10.3)
Chloride: 99 mmol/L (ref 98–111)
Creatinine, Ser: 9.36 mg/dL — ABNORMAL HIGH (ref 0.61–1.24)
GFR calc Af Amer: 6 mL/min — ABNORMAL LOW (ref >60)
GFR calc non Af Amer: 5 mL/min — ABNORMAL LOW (ref >60)
Glucose, Bld: 302 mg/dL — ABNORMAL HIGH (ref 70–99)
Potassium: 3.8 mmol/L (ref 3.5–5.1)
Sodium: 136 mmol/L (ref 135–145)
Total Bilirubin: 0.8 mg/dL (ref 0.3–1.2)
Total Protein: 7.6 g/dL (ref 6.5–8.1)

## 2020-02-07 LAB — TROPONIN I (HIGH SENSITIVITY): Troponin I (High Sensitivity): 63 ng/L — ABNORMAL HIGH (ref <18)

## 2020-02-07 LAB — CBC WITH DIFFERENTIAL/PLATELET
Abs Immature Granulocytes: 0.05 10*3/uL (ref 0.00–0.07)
Basophils Absolute: 0 10*3/uL (ref 0.0–0.1)
Basophils Relative: 1 %
Eosinophils Absolute: 0.2 10*3/uL (ref 0.0–0.5)
Eosinophils Relative: 2 %
HCT: 37 % — ABNORMAL LOW (ref 39.0–52.0)
Hemoglobin: 11.4 g/dL — ABNORMAL LOW (ref 13.0–17.0)
Immature Granulocytes: 1 %
Lymphocytes Relative: 41 %
Lymphs Abs: 2.7 10*3/uL (ref 0.7–4.0)
MCH: 28.9 pg (ref 26.0–34.0)
MCHC: 30.8 g/dL (ref 30.0–36.0)
MCV: 93.7 fL (ref 80.0–100.0)
Monocytes Absolute: 0.7 10*3/uL (ref 0.1–1.0)
Monocytes Relative: 11 %
Neutro Abs: 3 10*3/uL (ref 1.7–7.7)
Neutrophils Relative %: 44 %
Platelets: 161 10*3/uL (ref 150–400)
RBC: 3.95 MIL/uL — ABNORMAL LOW (ref 4.22–5.81)
RDW: 15.5 % (ref 11.5–15.5)
WBC: 6.6 10*3/uL (ref 4.0–10.5)
nRBC: 0 % (ref 0.0–0.2)

## 2020-02-07 LAB — RESPIRATORY PANEL BY RT PCR (FLU A&B, COVID)
Influenza A by PCR: NEGATIVE
Influenza B by PCR: NEGATIVE
SARS Coronavirus 2 by RT PCR: NEGATIVE

## 2020-02-07 LAB — GLUCOSE, CAPILLARY
Glucose-Capillary: 150 mg/dL — ABNORMAL HIGH (ref 70–99)
Glucose-Capillary: 134 mg/dL — ABNORMAL HIGH (ref 70–99)

## 2020-02-07 LAB — HIV ANTIBODY (ROUTINE TESTING W REFLEX): HIV Screen 4th Generation wRfx: NONREACTIVE

## 2020-02-07 LAB — MRSA PCR SCREENING: MRSA by PCR: NEGATIVE

## 2020-02-07 LAB — HEMOGLOBIN A1C
Hgb A1c MFr Bld: 6.2 % — ABNORMAL HIGH (ref 4.8–5.6)
Mean Plasma Glucose: 131.24 mg/dL

## 2020-02-07 LAB — LACTIC ACID, PLASMA
Lactic Acid, Venous: 2.5 mmol/L (ref 0.5–1.9)
Lactic Acid, Venous: 1.8 mmol/L (ref 0.5–1.9)

## 2020-02-07 LAB — PROTIME-INR
INR: 1 (ref 0.8–1.2)
Prothrombin Time: 13.1 seconds (ref 11.4–15.2)

## 2020-02-07 MED ORDER — LORATADINE 10 MG PO TABS
10.0000 mg | ORAL_TABLET | Freq: Every day | ORAL | Status: DC
  Administered 2020-02-07 – 2020-02-12 (×5): 10 mg via ORAL
  Filled 2020-02-07 (×5): qty 1

## 2020-02-07 MED ORDER — LACTULOSE 10 GM/15ML PO SOLN
10.0000 g | Freq: Every day | ORAL | Status: DC | PRN
  Administered 2020-02-12: 10 g via ORAL
  Filled 2020-02-07: qty 30

## 2020-02-07 MED ORDER — ACETAMINOPHEN 500 MG PO TABS: 500.0000 mg | ORAL_TABLET | Freq: Four times a day (QID) | ORAL | Status: DC | PRN

## 2020-02-07 MED ORDER — FUROSEMIDE 40 MG PO TABS
80.0000 mg | ORAL_TABLET | Freq: Every day | ORAL | Status: DC
  Administered 2020-02-08 – 2020-02-12 (×4): 80 mg via ORAL
  Filled 2020-02-07 (×2): qty 4
  Filled 2020-02-07 (×2): qty 2

## 2020-02-07 MED ORDER — HYDRALAZINE HCL 20 MG/ML IJ SOLN
INTRAMUSCULAR | Status: AC
  Filled 2020-02-07: qty 1

## 2020-02-07 MED ORDER — INSULIN ASPART 100 UNIT/ML ~~LOC~~ SOLN
2.0000 [IU] | Freq: Three times a day (TID) | SUBCUTANEOUS | Status: DC
  Administered 2020-02-07 – 2020-02-12 (×10): 2 [IU] via SUBCUTANEOUS
  Filled 2020-02-07 (×9): qty 1

## 2020-02-07 MED ORDER — LOSARTAN POTASSIUM 50 MG PO TABS
100.0000 mg | ORAL_TABLET | Freq: Every day | ORAL | Status: DC
  Administered 2020-02-08 – 2020-02-12 (×4): 100 mg via ORAL
  Filled 2020-02-07 (×4): qty 2

## 2020-02-07 MED ORDER — SODIUM CHLORIDE 0.9 % IV SOLN: 250.0000 mL | INTRAVENOUS | Status: DC | PRN

## 2020-02-07 MED ORDER — HYDRALAZINE HCL 20 MG/ML IJ SOLN
20.0000 mg | Freq: Once | INTRAMUSCULAR | Status: AC
  Administered 2020-02-07: 07:00:00 20 mg via INTRAVENOUS

## 2020-02-07 MED ORDER — LIDOCAINE 5 % EX PTCH
1.0000 | MEDICATED_PATCH | Freq: Two times a day (BID) | CUTANEOUS | Status: DC
  Filled 2020-02-07 (×11): qty 1

## 2020-02-07 MED ORDER — HYDRALAZINE HCL 50 MG PO TABS
25.0000 mg | ORAL_TABLET | Freq: Two times a day (BID) | ORAL | Status: DC
  Administered 2020-02-07: 22:00:00 25 mg via ORAL
  Filled 2020-02-07 (×2): qty 1

## 2020-02-07 MED ORDER — TAMSULOSIN HCL 0.4 MG PO CAPS
0.4000 mg | ORAL_CAPSULE | Freq: Every day | ORAL | Status: DC
  Administered 2020-02-07 – 2020-02-12 (×5): 0.4 mg via ORAL
  Filled 2020-02-07 (×6): qty 1

## 2020-02-07 MED ORDER — SODIUM CHLORIDE 0.9% FLUSH: 3.0000 mL | INTRAVENOUS | Status: DC | PRN

## 2020-02-07 MED ORDER — ONDANSETRON HCL 4 MG/2ML IJ SOLN
4.0000 mg | Freq: Once | INTRAMUSCULAR | Status: AC
  Administered 2020-02-07: 08:00:00 4 mg via INTRAVENOUS

## 2020-02-07 MED ORDER — ALLOPURINOL 300 MG PO TABS
300.0000 mg | ORAL_TABLET | Freq: Every day | ORAL | Status: DC
  Administered 2020-02-07 – 2020-02-12 (×5): 300 mg via ORAL
  Filled 2020-02-07 (×7): qty 1

## 2020-02-07 MED ORDER — ATORVASTATIN CALCIUM 20 MG PO TABS
20.0000 mg | ORAL_TABLET | Freq: Every day | ORAL | Status: DC
  Administered 2020-02-07 – 2020-02-12 (×5): 20 mg via ORAL
  Filled 2020-02-07 (×5): qty 1

## 2020-02-07 MED ORDER — NITROGLYCERIN 2 % TD OINT
1.0000 [in_us] | TOPICAL_OINTMENT | Freq: Once | TRANSDERMAL | Status: AC
  Administered 2020-02-07: 1 [in_us] via TOPICAL

## 2020-02-07 MED ORDER — COLCHICINE 0.6 MG PO TABS
0.6000 mg | ORAL_TABLET | Freq: Three times a day (TID) | ORAL | Status: DC | PRN
  Filled 2020-02-07: qty 1

## 2020-02-07 MED ORDER — CYCLOBENZAPRINE HCL 10 MG PO TABS
5.0000 mg | ORAL_TABLET | Freq: Three times a day (TID) | ORAL | Status: DC | PRN
  Filled 2020-02-07: qty 0.5

## 2020-02-07 MED ORDER — ONDANSETRON HCL 4 MG/2ML IJ SOLN
4.0000 mg | Freq: Four times a day (QID) | INTRAMUSCULAR | Status: DC | PRN
  Filled 2020-02-07: qty 2

## 2020-02-07 MED ORDER — CITALOPRAM HYDROBROMIDE 20 MG PO TABS
20.0000 mg | ORAL_TABLET | Freq: Every day | ORAL | Status: DC
  Administered 2020-02-07 – 2020-02-12 (×5): 20 mg via ORAL
  Filled 2020-02-07 (×5): qty 1

## 2020-02-07 MED ORDER — INSULIN ASPART 100 UNIT/ML ~~LOC~~ SOLN
0.0000 [IU] | Freq: Three times a day (TID) | SUBCUTANEOUS | Status: DC
  Administered 2020-02-08: 4 [IU] via SUBCUTANEOUS
  Administered 2020-02-09 (×3): 1 [IU] via SUBCUTANEOUS
  Administered 2020-02-10: 3 [IU] via SUBCUTANEOUS
  Administered 2020-02-11 (×2): 1 [IU] via SUBCUTANEOUS
  Filled 2020-02-07 (×6): qty 1

## 2020-02-07 MED ORDER — CHLORHEXIDINE GLUCONATE CLOTH 2 % EX PADS
6.0000 | MEDICATED_PAD | Freq: Every day | CUTANEOUS | Status: DC
  Administered 2020-02-07 – 2020-02-12 (×5): 6 via TOPICAL
  Filled 2020-02-07: qty 6

## 2020-02-07 MED ORDER — ONDANSETRON HCL 4 MG/2ML IJ SOLN
INTRAMUSCULAR | Status: AC
  Filled 2020-02-07: qty 2

## 2020-02-07 MED ORDER — RENA-VITE PO TABS
1.0000 | ORAL_TABLET | Freq: Every day | ORAL | Status: DC
  Administered 2020-02-07 – 2020-02-12 (×5): 1 via ORAL
  Filled 2020-02-07 (×7): qty 1

## 2020-02-07 MED ORDER — METOPROLOL SUCCINATE ER 25 MG PO TB24
25.0000 mg | ORAL_TABLET | Freq: Every day | ORAL | Status: DC
  Administered 2020-02-08 – 2020-02-10 (×3): 25 mg via ORAL
  Filled 2020-02-07 (×4): qty 1

## 2020-02-07 MED ORDER — LIDOCAINE-PRILOCAINE 2.5-2.5 % EX CREA
1.0000 "application " | TOPICAL_CREAM | Freq: Every day | CUTANEOUS | Status: DC | PRN
  Filled 2020-02-07: qty 5

## 2020-02-07 MED ORDER — HEPARIN SODIUM (PORCINE) 5000 UNIT/ML IJ SOLN
5000.0000 [IU] | Freq: Three times a day (TID) | INTRAMUSCULAR | Status: DC
  Administered 2020-02-07 – 2020-02-12 (×12): 5000 [IU] via SUBCUTANEOUS
  Filled 2020-02-07 (×12): qty 1

## 2020-02-07 MED ORDER — SODIUM CHLORIDE 0.9% FLUSH
3.0000 mL | Freq: Two times a day (BID) | INTRAVENOUS | Status: DC
  Administered 2020-02-07 – 2020-02-11 (×9): 3 mL via INTRAVENOUS

## 2020-02-07 MED ORDER — MAGNESIUM OXIDE 400 (241.3 MG) MG PO TABS
400.0000 mg | ORAL_TABLET | Freq: Every day | ORAL | Status: DC
  Administered 2020-02-07 – 2020-02-12 (×5): 400 mg via ORAL
  Filled 2020-02-07 (×5): qty 1

## 2020-02-07 MED ORDER — DOCUSATE SODIUM 100 MG PO CAPS
200.0000 mg | ORAL_CAPSULE | Freq: Every day | ORAL | Status: DC
  Administered 2020-02-07 – 2020-02-12 (×5): 200 mg via ORAL
  Filled 2020-02-07 (×5): qty 2

## 2020-02-07 NOTE — Progress Notes (Signed)
   02/07/20 2011  Hand-Off documentation  Handoff Given Given to shift RN/LPN  Handoff Received Received from shift RN/LPN  Report received from (Full Name) Cleola Perryman RN  Vital Signs  Temp (!) 97.5 F (36.4 C)  Temp Source Oral  Pulse Rate 67  Pulse Rate Source Dinamap  Resp 13  BP (!) 165/84  BP Location Left Arm  BP Method Automatic  Patient Position (if appropriate) Lying  Oxygen Therapy  SpO2 97 %  O2 Device Room Air  Pain Assessment  Pain Scale 0-10  Pain Score 0  During Hemodialysis Assessment  Blood Flow Rate (mL/min) 150 mL/min  Arterial Pressure (mmHg) 150 mmHg  Venous Pressure (mmHg) 60 mmHg  Transmembrane Pressure (mmHg) 70 mmHg  Ultrafiltration Rate (mL/min) 0 mL/min  Dialysate Flow Rate (mL/min) 800 ml/min  Conductivity: Machine  14.4  HD Safety Checks Performed Yes  Dialysis Fluid Bolus Normal Saline  Bolus Amount (mL) 250 mL  Intra-Hemodialysis Comments Tx completed  Post-Hemodialysis Assessment  Rinseback Volume (mL) 250 mL  Dialyzer Clearance Lightly streaked  Duration of HD Treatment -hour(s) 1.5 hour(s)  Hemodialysis Intake (mL) 500 mL  UF Total -Machine (mL) 1300 mL  Net UF (mL) 800 mL  Tolerated HD Treatment Yes  Post-Hemodialysis Comments cvc not functiong  properly ended treatment early  Education / Care Plan  Dialysis Education Provided Yes  Note  Observations no acute distress to note  Treatment completed early poor catheter function no s/s of distress to note, 800 ml removed

## 2020-02-07 NOTE — ED Notes (Signed)
EDP made aware of pt BP
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
## 2020-02-07 NOTE — ED Notes (Signed)
Date and time results received: 02/07/20 0833   Test: lactic acid Critical Value: 2.5  Name of Provider Notified: Dr. Kerman Passey

## 2020-02-07 NOTE — Progress Notes (Signed)
Pt arrived to dialysis unit alert on bipap  accompanied by nurse and RT. While doing assessment of graph in arm no thrill or bruit noted. Dr. Holley Raring at bedside also noted absence. ED nurse informed, while waiting on ED nurse RT arrived and pt was off bipap for 15 minutes prior maintaining oxygen levels in the high 90's to the 100's, no c/o shortness of breath states that he feels better. DR Holley Raring made aware.

## 2020-02-07 NOTE — ED Notes (Signed)
Wife at bedside.  Nitro paste removed d/t BP at this time.

## 2020-02-07 NOTE — Plan of Care (Signed)
Pt admitted to ICU 10, HD temp cath placed in Rt Fem, postive blood return, pt to dialyze this afternoon, antihypertensives held, pt oriented to room 10, understanding verbalized.  He declines a password at this time

## 2020-02-07 NOTE — Progress Notes (Signed)
[facility]  ROUNDING NOTE   Subjective:  Patient well-known to [place] as we follow him for hemodialysis on outpatient basis on MWF schedule. Patient did have his normal dialysis treatment on Friday. Presented now with significant shortness of breath requiring BiPAP and found to have pulmonary edema. Blood pressure also quite elevated. We attempted dialysis however his right upper extremity AV graft was found to be clotted. Therefore we requested temporary dialysis catheter to be placed and this has been placed.   Objective:  Vital signs in last 24 hours:  Temp:  [97.2 F (36.2 C)] 97.2 F (36.2 C) (04/18 1500) Pulse Rate:  [52-107] 68 (04/18 1500) Resp:  [16-36] 17 (04/18 1500) BP: (87-190)/(38-89) 181/89 (04/18 1500) SpO2:  [86 %-100 %] 97 % (04/18 1500) Weight:  [113.4 kg] 113.4 kg (04/18 0731)  Weight change:  Filed Weights   02/07/20 0731  Weight: 113.4 kg    Intake/Output: No intake/output data recorded.   Intake/Output this shift:  No intake/output data recorded.  Physical Exam: General: No acute distress  Head: Normocephalic, atraumatic. Moist oral mucosal membranes  Eyes: Anicteric  Neck: Supple, trachea midline  Lungs:   Basilar rales, normal effort  Heart: S1S2 no rubs  Abdomen:  Soft, nontender, bowel sounds present  Extremities: Trace peripheral edema.  Neurologic: Awake, alert, following commands  Skin: No lesions  Access: Clotted right upper extremity AV graft, right temporary femoral catheter placed    Basic Metabolic Panel: Recent Labs  Lab 02/07/20 0723  NA 136  K 3.8  CL 99  CO2 26  GLUCOSE 302*  BUN 54*  CREATININE 9.36*  CALCIUM 8.7*    Liver Function Tests: Recent Labs  Lab 02/07/20 0723  AST 30  ALT 30  ALKPHOS 76  BILITOT 0.8  PROT 7.6  ALBUMIN 3.7   No results for input(s): LIPASE, AMYLASE in the last 168 hours. No results for input(s): AMMONIA in the last 168 hours.  CBC: Recent Labs  Lab 02/07/20 0723   WBC 6.6  NEUTROABS 3.0  HGB 11.4*  HCT 37.0*  MCV 93.7  PLT 161    Cardiac Enzymes: No results for input(s): CKTOTAL, CKMB, CKMBINDEX, TROPONINI in the last 168 hours.  BNP: Invalid input(s): POCBNP  CBG: Recent Labs  Lab 02/07/20 1435  GLUCAP 150*    Microbiology: Results for orders placed or performed during the hospital encounter of 02/07/20  Respiratory Panel by RT PCR (Flu A&B, Covid) - Nasopharyngeal Swab     Status: None   Collection Time: 02/07/20  8:20 AM   Specimen: Nasopharyngeal Swab  Result Value Ref Range Status   SARS Coronavirus 2 by RT PCR NEGATIVE NEGATIVE Final    Comment: (NOTE) SARS-CoV-2 target nucleic acids are NOT DETECTED. The SARS-CoV-2 RNA is generally detectable in upper respiratoy specimens during the acute phase of infection. The lowest concentration of SARS-CoV-2 viral copies this assay can detect is 131 copies/mL. A negative result does not preclude SARS-Cov-2 infection and should not be used as the sole basis for treatment or other patient management decisions. A negative result may occur with  improper specimen collection/handling, submission of specimen other than nasopharyngeal swab, presence of viral mutation(s) within the areas targeted by this assay, and inadequate number of viral copies (<131 copies/mL). A negative result must be combined with clinical observations, patient history, and epidemiological information. The expected result is Negative. Fact Sheet for Patients:  PinkCheek.be Fact Sheet for Healthcare Providers:  GravelBags.it This test is not yet ap  proved or cleared by the [place] and  has been authorized for detection and/or diagnosis of SARS-CoV-2 by FDA under an Emergency Use Authorization (EUA). This EUA will remain  in effect (meaning this test can be used) for the duration of the COVID-19 declaration under Section 564(b)(1) of the Act, 21  U.S.C. section 360bbb-3(b)(1), unless the authorization is terminated or revoked sooner.    Influenza A by PCR NEGATIVE NEGATIVE Final   Influenza B by PCR NEGATIVE NEGATIVE Final    Comment: (NOTE) The Xpert Xpress SARS-CoV-2/FLU/RSV assay is intended as an aid in  the diagnosis of influenza from Nasopharyngeal swab specimens and  should not be used as a sole basis for treatment. Nasal washings and  aspirates are unacceptable for Xpert Xpress SARS-CoV-2/FLU/RSV  testing. Fact Sheet for Patients: PinkCheek.be Fact Sheet for Healthcare Providers: GravelBags.it This test is not yet approved or cleared by the [place] FDA and  has been authorized for detection and/or diagnosis of SARS-CoV-2 by  FDA under an Emergency Use Authorization (EUA). This EUA will remain  in effect (meaning this test can be used) for the duration of the  Covid-19 declaration under Section 564(b)(1) of the Act, 21  U.S.C. section 360bbb-3(b)(1), unless the authorization is  terminated or revoked. Performed at [facility], [address] [place], [place] [address]     Coagulation Studies: Recent Labs    02/07/20 0723  LABPROT 13.1  INR 1.0    Urinalysis: No results for input(s): COLORURINE, LABSPEC, PHURINE, GLUCOSEU, HGBUR, BILIRUBINUR, KETONESUR, PROTEINUR, UROBILINOGEN, NITRITE, LEUKOCYTESUR in the last 72 hours.  Invalid input(s): APPERANCEUR    Imaging: DG Chest Portable 1 View  Result Date: 02/07/2020 CLINICAL DATA:  Shortness of breath. EXAM: PORTABLE CHEST 1 VIEW COMPARISON:  June 27, 2019. FINDINGS: Stable cardiomegaly. No pneumothorax or pleural effusion is noted. Mild central pulmonary vascular congestion is noted. Bibasilar opacities are noted concerning for edema or possibly atelectasis. Bony thorax is unremarkable. IMPRESSION: Stable cardiomegaly with mild central pulmonary vascular congestion. Bibasilar  opacities are noted concerning for edema or possibly atelectasis. Electronically Signed   By: Marijo Conception M.D.   On: 02/07/2020 08:18     Medications:   . sodium chloride     . allopurinol  300 mg Oral Daily  . atorvastatin  20 mg Oral Daily  . Chlorhexidine Gluconate Cloth  6 each Topical Q0600  . citalopram  20 mg Oral Daily  . docusate sodium  200 mg Oral Daily  . furosemide  80 mg Oral Daily  . heparin  5,000 Units Subcutaneous Q8H  . hydrALAZINE  25 mg Oral BID  . insulin aspart  0-6 Units Subcutaneous TID WC  . insulin aspart  2 Units Subcutaneous TID WC  . lidocaine  1 patch Transdermal Q12H  . loratadine  10 mg Oral Daily  . losartan  100 mg Oral Daily  . magnesium oxide  400 mg Oral Daily  . metoprolol succinate  25 mg Oral Daily  . multivitamin  1 tablet Oral Daily  . sodium chloride flush  3 mL Intravenous Q12H  . tamsulosin  0.4 mg Oral Daily   sodium chloride, acetaminophen, colchicine, cyclobenzaprine, lactulose, lidocaine-prilocaine, ondansetron (ZOFRAN) IV, sodium chloride flush  Assessment/ Plan:  64 y.o. male with past medical history of ESRD on HD MWF, congestive heart failure, diabetes mellitus type 2, gout, hyperlipidemia, hypertension, history of migraine headaches, anemia chronic kidney disease, secondary hyperparathyroidism who was admitted with significant shortness of breath,  acute respiratory failure secondary to pulmonary edema.  [facility]/[address]/MWF/118.5  1.  ESRD on HD MWF.  Patient with significant shortness of breath upon admission.  Now found to have clotted right upper extremity AV graft.  He will need further investigation of this but needs dialysis now.  Appreciate vascular surgery assistance in place temporary right femoral dialysis catheter.  We will proceed with dialysis session and attempt to remove 2.5 kg today.  We will plan for dialysis again tomorrow as well.  2.  Acute respiratory failure.  Secondary to elevated blood pressure as  well as pulmonary edema as a result.  Proceeding with dialysis as above.  3.  Anemia chronic kidney disease.  Hemoglobin 1.4.  Hold off on Epogen for now.  4.  Secondary hyperparathyroidism.  Check serum phosphorus with dialysis treatment.  Was receiving Sensipar 30 mg 3 times a week as an outpatient.  LOS: 1 Alondra Sahni 4/18/20213:13 PM

## 2020-02-07 NOTE — ED Triage Notes (Signed)
Pt arrives [facility] from home for being unresponsive. Pt dry heaving upon arrival to ER with non reabreather on. Pt responsive to pain. Dialysis pt, last treatment Friday. Recently started on antibiotic for UTI. Arrives extremely diaphoretic. Pt answering questions- states "I can't breath" denies pain. CBG 231.

## 2020-02-07 NOTE — Op Note (Signed)
  OPERATIVE NOTE   PROCEDURE: 1. Insertion of temporary dialysis catheter catheter right CFVapproach.  PRE-OPERATIVE DIAGNOSIS: ESRD with clotted shunt in need of acute dialysis for hypervolemia  POST-OPERATIVE DIAGNOSIS: same  SURGEON: Elnora Morrison,  M.D.  ANESTHESIA: 1% lidocaine local infiltration  ESTIMATED BLOOD LOSS: Minimal 5 cc  INDICATIONS:   Richard Chen is a 64 y.o. male who presents with SOB and volume overload. His arm access is clotted.  DESCRIPTION: After obtaining full informed written consent, the patient was positioned supine. Time out is taken.The right groin was prepped and draped in a sterile fashion. Ultrasound was placed in a sterile sleeve.Using a Seldinger needle is inserted into the vein and the guidewires advanced without difficulty. Small counterincision was made at the wire insertion site. Dilator is passed over the wire and the temporary dialysis catheter catheter is fed over the wire without difficulty.  All lumens aspirate and flush easily and are packed with heparin saline. Catheter secured to the skin of the right groin with 2-0 silk. A sterile dressing is applied .  COMPLICATIONS: None  CONDITION: Unchanged  Richardson Dopp Office:  [phone] 02/07/2020, 2:24 PM

## 2020-02-07 NOTE — ED Notes (Signed)
Pt placed on bipap at this time. Nitro paste to R chest.
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
## 2020-02-08 ENCOUNTER — Inpatient Hospital Stay
Admit: 2020-02-08 | Discharge: 2020-02-08 | Disposition: A | Discharge status: Home / Self Care | Payer: Medicare HMO | Attending: Vascular Surgery | Admitting: Vascular Surgery

## 2020-02-08 ENCOUNTER — Encounter
Admission: EM | Disposition: A | Discharge status: Home / Self Care | Payer: Self-pay | Source: Home / Self Care | Attending: Internal Medicine

## 2020-02-08 ENCOUNTER — Encounter: Payer: Self-pay | Admitting: Internal Medicine

## 2020-02-08 DIAGNOSIS — N186 End stage renal disease: Secondary | ICD-10-CM

## 2020-02-08 DIAGNOSIS — Z992 Dependence on renal dialysis: Secondary | ICD-10-CM

## 2020-02-08 DIAGNOSIS — I161 Hypertensive emergency: Secondary | ICD-10-CM

## 2020-02-08 DIAGNOSIS — T82868A Thrombosis of vascular prosthetic devices, implants and grafts, initial encounter: Secondary | ICD-10-CM

## 2020-02-08 HISTORY — PX: A/V SHUNT INTERVENTION: CATH118220

## 2020-02-08 LAB — URINALYSIS, COMPLETE (UACMP) WITH MICROSCOPIC
Bilirubin Urine: NEGATIVE
Glucose, UA: NEGATIVE mg/dL
Hgb urine dipstick: NEGATIVE
Ketones, ur: NEGATIVE mg/dL
Nitrite: NEGATIVE
Protein, ur: >=300 mg/dL — AB
Specific Gravity, Urine: 1.01 (ref 1.005–1.030)
pH: 9 — ABNORMAL HIGH (ref 5.0–8.0)

## 2020-02-08 LAB — GLUCOSE, CAPILLARY
Glucose-Capillary: 100 mg/dL — ABNORMAL HIGH (ref 70–99)
Glucose-Capillary: 106 mg/dL — ABNORMAL HIGH (ref 70–99)
Glucose-Capillary: 321 mg/dL — ABNORMAL HIGH (ref 70–99)

## 2020-02-08 LAB — BASIC METABOLIC PANEL
Anion gap: 11 (ref 5–15)
BUN: 50 mg/dL — ABNORMAL HIGH (ref 8–23)
CO2: 29 mmol/L (ref 22–32)
Calcium: 8.8 mg/dL — ABNORMAL LOW (ref 8.9–10.3)
Chloride: 100 mmol/L (ref 98–111)
Creatinine, Ser: 8.78 mg/dL — ABNORMAL HIGH (ref 0.61–1.24)
GFR calc Af Amer: 7 mL/min — ABNORMAL LOW (ref >60)
GFR calc non Af Amer: 6 mL/min — ABNORMAL LOW (ref >60)
Glucose, Bld: 108 mg/dL — ABNORMAL HIGH (ref 70–99)
Potassium: 4.3 mmol/L (ref 3.5–5.1)
Sodium: 140 mmol/L (ref 135–145)

## 2020-02-08 SURGERY — A/V SHUNT INTERVENTION
Anesthesia: Moderate Sedation | Laterality: Right

## 2020-02-08 MED ORDER — ALTEPLASE 2 MG IJ SOLR
INTRAMUSCULAR | Status: DC | PRN
  Administered 2020-02-08: 4 mg

## 2020-02-08 MED ORDER — IODIXANOL 320 MG/ML IV SOLN
INTRAVENOUS | Status: DC | PRN
  Administered 2020-02-08: 55 mL via INTRAVENOUS

## 2020-02-08 MED ORDER — METHYLPREDNISOLONE SODIUM SUCC 125 MG IJ SOLR
INTRAMUSCULAR | Status: AC
  Administered 2020-02-08: 12:00:00 125 mg via INTRAVENOUS
  Filled 2020-02-08: qty 2

## 2020-02-08 MED ORDER — FENTANYL CITRATE (PF) 100 MCG/2ML IJ SOLN
INTRAMUSCULAR | Status: AC
  Filled 2020-02-08: qty 2

## 2020-02-08 MED ORDER — HYDROMORPHONE HCL 1 MG/ML IJ SOLN: 1.0000 mg | Freq: Once | INTRAMUSCULAR | Status: DC | PRN

## 2020-02-08 MED ORDER — AMLODIPINE BESYLATE 10 MG PO TABS
10.0000 mg | ORAL_TABLET | Freq: Every day | ORAL | Status: DC
  Administered 2020-02-08 – 2020-02-12 (×4): 10 mg via ORAL
  Filled 2020-02-08 (×4): qty 1

## 2020-02-08 MED ORDER — FENTANYL CITRATE (PF) 100 MCG/2ML IJ SOLN
INTRAMUSCULAR | Status: DC | PRN
  Administered 2020-02-08: 25 ug via INTRAVENOUS
  Administered 2020-02-08: 50 ug via INTRAVENOUS

## 2020-02-08 MED ORDER — DIPHENHYDRAMINE HCL 50 MG/ML IJ SOLN
INTRAMUSCULAR | Status: AC
  Administered 2020-02-08: 12:00:00 50 mg via INTRAVENOUS
  Filled 2020-02-08: qty 1

## 2020-02-08 MED ORDER — FAMOTIDINE 20 MG PO TABS
ORAL_TABLET | ORAL | Status: AC
  Administered 2020-02-08: 40 mg via ORAL
  Filled 2020-02-08: qty 2

## 2020-02-08 MED ORDER — HYDRALAZINE HCL 50 MG PO TABS
50.0000 mg | ORAL_TABLET | Freq: Three times a day (TID) | ORAL | Status: DC
  Administered 2020-02-08 – 2020-02-12 (×10): 50 mg via ORAL
  Filled 2020-02-08 (×10): qty 1

## 2020-02-08 MED ORDER — MIDAZOLAM HCL 2 MG/ML PO SYRP
8.0000 mg | ORAL_SOLUTION | Freq: Once | ORAL | Status: DC | PRN
  Filled 2020-02-08: qty 4

## 2020-02-08 MED ORDER — DIPHENHYDRAMINE HCL 50 MG/ML IJ SOLN: 50.0000 mg | Freq: Once | INTRAMUSCULAR | Status: AC | PRN

## 2020-02-08 MED ORDER — FAMOTIDINE 20 MG PO TABS: 40.0000 mg | ORAL_TABLET | Freq: Once | ORAL | Status: AC | PRN

## 2020-02-08 MED ORDER — ONDANSETRON HCL 4 MG/2ML IJ SOLN
4.0000 mg | Freq: Four times a day (QID) | INTRAMUSCULAR | Status: DC | PRN
  Administered 2020-02-10: 4 mg via INTRAVENOUS

## 2020-02-08 MED ORDER — CEFAZOLIN SODIUM-DEXTROSE 1-4 GM/50ML-% IV SOLN
1.0000 g | Freq: Once | INTRAVENOUS | Status: AC
  Administered 2020-02-08: 1 g via INTRAVENOUS
  Filled 2020-02-08: qty 50

## 2020-02-08 MED ORDER — HEPARIN SODIUM (PORCINE) 1000 UNIT/ML IJ SOLN
INTRAMUSCULAR | Status: AC
  Filled 2020-02-08: qty 1

## 2020-02-08 MED ORDER — METHYLPREDNISOLONE SODIUM SUCC 125 MG IJ SOLR: 125.0000 mg | Freq: Once | INTRAMUSCULAR | Status: AC | PRN

## 2020-02-08 MED ORDER — SODIUM CHLORIDE 0.9 % IV SOLN: INTRAVENOUS | Status: DC

## 2020-02-08 MED ORDER — MIDAZOLAM HCL 5 MG/5ML IJ SOLN
INTRAMUSCULAR | Status: AC
  Filled 2020-02-08: qty 5

## 2020-02-08 MED ORDER — MIDAZOLAM HCL 2 MG/2ML IJ SOLN
INTRAMUSCULAR | Status: DC | PRN
  Administered 2020-02-08: 1 mg via INTRAVENOUS
  Administered 2020-02-08: 2 mg via INTRAVENOUS

## 2020-02-08 MED ORDER — HEPARIN SODIUM (PORCINE) 1000 UNIT/ML IJ SOLN
INTRAMUSCULAR | Status: DC | PRN
  Administered 2020-02-08: 4000 [IU] via INTRAVENOUS

## 2020-02-08 SURGICAL SUPPLY — 17 items
BALLN DORADO7X100X80 (BALLOONS) ×4
BALLN ULTRVRSE 10X60X75 (BALLOONS) ×2
BALLOON DORADO7X100X80 (BALLOONS) ×2 IMPLANT
BALLOON ULTRVRSE 10X60X75 (BALLOONS) ×1 IMPLANT
CANISTER PENUMBRA ENGINE (MISCELLANEOUS) ×2 IMPLANT
CANNULA 5F STIFF (CANNULA) ×2 IMPLANT
CATH BEACON 5 .035 40 KMP TP (CATHETERS) ×1 IMPLANT
CATH BEACON 5 .038 40 KMP TP (CATHETERS) ×2
CATH EMBOLECTOMY 5FR (BALLOONS) ×2 IMPLANT
CATH INDIGO 7D KIT (CATHETERS) ×2 IMPLANT
DEVICE PRESTO INFLATION (MISCELLANEOUS) ×2 IMPLANT
PACK ANGIOGRAPHY (CUSTOM PROCEDURE TRAY) ×2 IMPLANT
SET INTRO CAPELLA COAXIAL (SET/KITS/TRAYS/PACK) ×2 IMPLANT
SHEATH BRITE TIP 6FRX5.5 (SHEATH) ×4 IMPLANT
SHEATH BRITE TIP 7FRX5.5 (SHEATH) ×2 IMPLANT
SUT MNCRL AB 4-0 PS2 18 (SUTURE) ×2 IMPLANT
WIRE MAGIC TOR.035 180C (WIRE) ×6 IMPLANT

## 2020-02-08 NOTE — Progress Notes (Signed)
Hemodialysis patient known at [facility] ([place]) MWF 5:30am. Patient normally drives self to treatments. Please contact me with any dialysis placement concerns.

## 2020-02-08 NOTE — Progress Notes (Signed)
Treatment resumed. Pt is alert and oriented. NS bolus added to goal. Goal now 2.5L

## 2020-02-08 NOTE — Op Note (Signed)
Richard Chen    OPERATIVE NOTE   PROCEDURE: 1.  Right brachial artery to axillary vein arteriovenous graft cannulation under ultrasound guidance twice, in both a retrograde and then antegrade fashion crossing 2.  Right arm shuntogram and central venogram 3.  Catheter directed thrombolysis with 4 mg of TPA  4.  Mechanical thrombectomy to the right brachial artery to axillary vein AV graft with the penumbra CAT 7D catheter 5.  Fogarty embolectomy for residual arterial plug 6.  Percutaneous transluminal angioplasty of arterial anastomosis with 7 mm diameter angioplasty balloon 7.  Percutaneous transluminal angioplasty of the mid and distal graft with 7 mm diameter high-pressure angioplasty balloon 8.  Percutaneous transluminal angioplasty of the right subclavian vein with 10 mm diameter angioplasty balloon  PRE-OPERATIVE DIAGNOSIS: 1. ESRD 2.  Thrombosed right brachial artery to axillary vein arteriovenous graft  POST-OPERATIVE DIAGNOSIS: same as above   SURGEON: Leotis Pain, MD  ANESTHESIA: local with Moderate Conscious Sedation for approximately 60 minutes using 3 mg of Versed and 75 mcg of Fentanyl  ESTIMATED BLOOD LOSS: 50 cc  FINDING(S): High-grade stenosis in the 90% range in the midportion of the graft which was likely the cause of the failure.  There was also a 70 to 80% stenosis in the right subclavian vein with marked collaterals around the area which also was a possible cause of failure of the graft.  SPECIMEN(S):  None  CONTRAST: 55 cc  FLUORO TIME:  6.5 minutes  INDICATIONS: Patient is a 64 y.o.male who presents with a thrombosed right brachial artery to axillary vein arteriovenous graft.  The patient is scheduled for an attempted declot and shuntogram.  The patient is aware the risks include but are not limited to: bleeding, infection, thrombosis of the cannulated access, and possible anaphylactic reaction to the contrast.  The patient is aware of  the risks of the procedure and elects to proceed forward.  DESCRIPTION: After full informed written consent was obtained, the patient was brought back to the angiography suite and placed supine upon the angiography table.  The patient was connected to monitoring equipment. Moderate conscious sedation was administered with a face to face encounter with the patient throughout the procedure with my supervision of the RN administering medicines and monitoring the patient's vital signs, pulse oximetry, telemetry and mental status throughout from the start of the procedure until the patient was taken to the recovery room. The right arm was prepped and draped in the standard fashion for a percutaneous access intervention.  Under ultrasound guidance, the brachial artery to axillary vein arteriovenous graft was cannulated with a micropuncture needle under direct ultrasound guidance due to the pulseless nature of the graft twice, in both an antegrade and a retrograde fashion crossing, and permanent images were performed.  The microwire was advanced and the needle was exchanged for the a microsheath.  I then upsized to a 6 Fr Sheath and imaging was performed.  Hand injections were completed to image the access including the central venous system. This demonstrated no flow within the AV graft.  Based on the images, this patient will need extensive treatment to salvage the graft. I then gave the patient 3000 units of intravenous heparin.  I then placed a Magic torque wire into the brachial artery from the retrograde sheath and into the axillary vein from the antegrade sheath. 4 mg of TPA were deployed. This was allowed to dwell.  I upsized to a 7 [place] sheath for the antegrade sheath.  Mechanical  thrombectomy was then performed throughout the graft and into the axillary vein.  This was done using the penumbra CAT 7D catheter this uncovered a high-grade stenosis in the 90% range in the mid graft.  A residual arterial plug  was also seen at the arterial anastomosis. An attempt to clear the arterial plug was done with two passes of the Fogarty embolectomy balloon. Flow-limiting arterial plug remained, and I elected to treat this lesion with a 7 mm diameter by 10 cm length angioplasty balloon inflated to 12 atm for 1 minute. This resulted in resolution of the arterial plug, and clearance of the arterial side of the graft. The arterial outflow was seen to be intact as well on these images. The retrograde sheath was removed. I then turned my attention to the thrombus in the distal graft and the axillary vein. Mechanical thrombectomy was performed again using the penumbra CAT 7D catheter. This resulted in resolution of the thrombus but the area of stenosis in the mid graft remained.  I then elected to treat this with a 7 mm diameter by 10 cm length high-pressure angioplasty balloon inflated to 16 atm for 1 minute.  Marked improvement was seen following this with only about a 10 to 15% residual stenosis in the mid graft.  We were now better able to evaluate the central venous circulation.  It was found there was a high-grade stenosis in the right subclavian vein in the 70 to 80% range right at the junction to the cephalic vein.  The right innominate vein and superior vena cava were widely patent.  Across the central venous stenosis with a Magic torque wire and treated with a 10 mm diameter by 6 cm length angioplasty balloon inflated to 10 atm for 1 minute.  Following angioplasty of the right subclavian vein, only about a 15 to 20% residual stenosis was present.    Based on the completion imaging, no further intervention is necessary.  The wire and balloon were removed from the sheath.  A 4-0 Monocryl purse-string suture was sewn around the sheath.  The sheath was removed while tying down the suture.  A sterile bandage was applied to the puncture site.  COMPLICATIONS: None  CONDITION: Stable   Leotis Pain 02/08/2020 2:00 PM   This  note was created with Dragon Medical transcription system. Any errors in dictation are purely unintentional.

## 2020-02-08 NOTE — Progress Notes (Signed)
PROGRESS NOTE    Richard Chen   RWE:[id number]  DOB: 12/23/1955  DOA: 02/07/2020 PCP: Venia Carbon, MD   Brief Narrative:  Richard Chen is a 64 y.o. male with medical history significant for end-stage renal disease (dialysis days Monday/Wednesday/Friday), history of diabetes mellitus, hypertension, dyslipidemia who presents to the emergency room via EMS for evaluation of worsening shortness of breath and decreased responsiveness. When patient presented to the emergency room he was noted to be in respiratory distress, sitting up in bed tachypneic with audible rhonchi.   His blood pressure was significantly elevated in the emergency room with systolic blood pressure of 619 systolic.  Patient received a dose of IV hydralazine 20 mg and an inch of Nitropaste was applied to his anterior chest wall.  Patient was placed on BiPAP in the emergency room to reduce his work of breathing.   Subjective:35 Dyspnea at rest improved. DOE still present. No chest pain.   Assessment & Plan:   Principal Problem:   Acute on chronic diastolic CHF (congestive heart failure)/ Acute respiratory failure  Suspected above was due to Hypertensive emergency - he was dialyzed yesterday and fluid was removed and needs another treatment today -   needs BP meds optimized- allowing renal to manage this  Active Problems:   End stage renal disease on dialysis- [facility]PT - MWF dialysis- has not missed any treatments   Right arm access clotted - temp cath placed yesterday - access declotted today      History of anemia due to chronic kidney disease - follow Hb     Time spent in minutes: 35 DVT prophylaxis: Heparin Code Status: Full code Family Communication:  Disposition Plan:  Status is: Inpatient  Remains inpatient appropriate because:Inpatient level of care appropriate due to severity of illness- needs inpatient dialysis, BP management to prevent recurrent admisson   Dispo: The patient is from:  Home              Anticipated d/c is to: Home              Anticipated d/c date is: 1 day              Patient currently is not medically stable to d/c. Still fluid overloaded and short of breath- will need dialysis again today. Needs BP better controlled  Consultants:   stable Procedures:   Declot of dialysis access  Temp dialysis cath placed Antimicrobials:  Anti-infectives (From admission, onward)   Start     Dose/Rate Route Frequency Ordered Stop   02/08/20 1115  ceFAZolin (ANCEF) IVPB 1 g/50 mL premix    Note to Pharmacy: To be given in specials   1 g 100 mL/hr over 30 Minutes Intravenous  Once 02/08/20 1114 02/08/20 1247       Objective: Vitals:   02/08/20 1100 02/08/20 1137 02/08/20 1330 02/08/20 1345  BP:  (!) 174/76 (!) 168/80 (!) 165/71  Pulse: 69 63 71 70  Resp: 18 15 16 18   Temp:  98.9 F (37.2 C)    TempSrc:  Oral    SpO2: 97% 99% 95% 95%  Weight:      Height:        Intake/Output Summary (Last 24 hours) at 02/08/2020 1358 Last data filed at 02/08/2020 0800 Gross per 24 hour  Intake 3 ml  Output 1125 ml  Net -1122 ml   Filed Weights   02/07/20 0731 02/07/20 1500  Weight: 113.4 kg 124.4 kg    Examination:  General exam: Appears comfortable  HEENT: PERRLA, oral mucosa moist, no sclera icterus or thrush Respiratory system: crackles at bases. Respiratory effort normal. Cardiovascular system: S1 & S2 heard, RRR.   Gastrointestinal system: Abdomen soft, non-tender, nondistended. Normal bowel sounds. Central nervous system: Alert and oriented. No focal neurological deficits. Extremities: No cyanosis, clubbing or edema Skin: No rashes or ulcers Psychiatry:  Mood & affect appropriate.     Data Reviewed: I have personally reviewed following labs and imaging studies  CBC: Recent Labs  Lab 02/07/20 0723  WBC 6.6  NEUTROABS 3.0  HGB 11.4*  HCT 37.0*  MCV 93.7  PLT 623   Basic Metabolic Panel: Recent Labs  Lab 02/07/20 0723 02/08/20 0342    NA 136 140  K 3.8 4.3  CL 99 100  CO2 26 29  GLUCOSE 302* 108*  BUN 54* 50*  CREATININE 9.36* 8.78*  CALCIUM 8.7* 8.8*   GFR: Estimated Creatinine Clearance: 11.4 mL/min (A) (by C-G formula based on SCr of 8.78 mg/dL (H)). Liver Function Tests: Recent Labs  Lab 02/07/20 0723  AST 30  ALT 30  ALKPHOS 76  BILITOT 0.8  PROT 7.6  ALBUMIN 3.7   No results for input(s): LIPASE, AMYLASE in the last 168 hours. No results for input(s): AMMONIA in the last 168 hours. Coagulation Profile: Recent Labs  Lab 02/07/20 0723  INR 1.0   Cardiac Enzymes: No results for input(s): CKTOTAL, CKMB, CKMBINDEX, TROPONINI in the last 168 hours. BNP (last 3 results) No results for input(s): PROBNP in the last 8760 hours. HbA1C: Recent Labs    02/07/20 0723  HGBA1C 6.2*   CBG: Recent Labs  Lab 02/07/20 1435 02/07/20 1608 02/08/20 0738  GLUCAP 150* 134* 100*   Lipid Profile: No results for input(s): CHOL, HDL, LDLCALC, TRIG, CHOLHDL, LDLDIRECT in the last 72 hours. Thyroid Function Tests: No results for input(s): TSH, T4TOTAL, FREET4, T3FREE, THYROIDAB in the last 72 hours. Anemia Panel: No results for input(s): VITAMINB12, FOLATE, FERRITIN, TIBC, IRON, RETICCTPCT in the last 72 hours. Urine analysis:    Component Value Date/Time   COLORURINE YELLOW (A) 02/07/2020 0938   APPEARANCEUR CLEAR (A) 02/07/2020 0938   APPEARANCEUR Clear 01/26/2020 1325   LABSPEC 1.010 02/07/2020 0938   PHURINE 9.0 (H) 02/07/2020 0938   GLUCOSEU NEGATIVE 02/07/2020 0938   HGBUR NEGATIVE 02/07/2020 0938   HGBUR negative 01/24/2010 1054   BILIRUBINUR NEGATIVE 02/07/2020 0938   BILIRUBINUR Negative 01/26/2020 1325   KETONESUR NEGATIVE 02/07/2020 0938   PROTEINUR >=300 (A) 02/07/2020 0938   UROBILINOGEN 0.2 08/27/2012 1438   UROBILINOGEN 0.2 01/24/2010 1054   NITRITE NEGATIVE 02/07/2020 0938   LEUKOCYTESUR TRACE (A) 02/07/2020 0938   Sepsis Labs: @LABRCNTIP (procalcitonin:4,lacticidven:4) ) Recent  Results (from the past 240 hour(s))  Culture, blood (Routine x 2)     Status: None (Preliminary result)   Collection Time: 02/07/20  7:23 AM   Specimen: BLOOD  Result Value Ref Range Status   Specimen Description BLOOD BLOOD LEFT FOREARM  Final   Special Requests   Final    BOTTLES DRAWN AEROBIC AND ANAEROBIC Blood Culture adequate volume   Culture   Final    NO GROWTH < 24 HOURS Performed at [facility], [address] [place], [place] [address]    Report Status PENDING  Incomplete  Culture, blood (Routine x 2)     Status: None (Preliminary result)   Collection Time: 02/07/20  7:23 AM   Specimen: BLOOD  Result Value Ref Range Status  Specimen Description BLOOD LEFT WRIST  Final   Special Requests   Final    BOTTLES DRAWN AEROBIC AND ANAEROBIC Blood Culture adequate volume   Culture   Final    NO GROWTH < 24 HOURS Performed at [facility], [address] [place], [place] [address]    Report Status PENDING  Incomplete  Respiratory Panel by RT PCR (Flu A&B, Covid) - Nasopharyngeal Swab     Status: None   Collection Time: 02/07/20  8:20 AM   Specimen: Nasopharyngeal Swab  Result Value Ref Range Status   SARS Coronavirus 2 by RT PCR NEGATIVE NEGATIVE Final    Comment: (NOTE) SARS-CoV-2 target nucleic acids are NOT DETECTED. The SARS-CoV-2 RNA is generally detectable in upper respiratoy specimens during the acute phase of infection. The lowest concentration of SARS-CoV-2 viral copies this assay can detect is 131 copies/mL. A negative result does not preclude SARS-Cov-2 infection and should not be used as the sole basis for treatment or other patient management decisions. A negative result may occur with  improper specimen collection/handling, submission of specimen other than nasopharyngeal swab, presence of viral mutation(s) within the areas targeted by this assay, and inadequate number of viral copies (<131 copies/mL). A negative result must be combined  with clinical observations, patient history, and epidemiological information. The expected result is Negative. Fact Sheet for Patients:  PinkCheek.be Fact Sheet for Healthcare Providers:  GravelBags.it This test is not yet ap proved or cleared by the [place] FDA and  has been authorized for detection and/or diagnosis of SARS-CoV-2 by FDA under an Emergency Use Authorization (EUA). This EUA will remain  in effect (meaning this test can be used) for the duration of the COVID-19 declaration under Section 564(b)(1) of the Act, 21 U.S.C. section 360bbb-3(b)(1), unless the authorization is terminated or revoked sooner.    Influenza A by PCR NEGATIVE NEGATIVE Final   Influenza B by PCR NEGATIVE NEGATIVE Final    Comment: (NOTE) The Xpert Xpress SARS-CoV-2/FLU/RSV assay is intended as an aid in  the diagnosis of influenza from Nasopharyngeal swab specimens and  should not be used as a sole basis for treatment. Nasal washings and  aspirates are unacceptable for Xpert Xpress SARS-CoV-2/FLU/RSV  testing. Fact Sheet for Patients: PinkCheek.be Fact Sheet for Healthcare Providers: GravelBags.it This test is not yet approved or cleared by the [place] FDA and  has been authorized for detection and/or diagnosis of SARS-CoV-2 by  FDA under an Emergency Use Authorization (EUA). This EUA will remain  in effect (meaning this test can be used) for the duration of the  Covid-19 declaration under Section 564(b)(1) of the Act, 21  U.S.C. section 360bbb-3(b)(1), unless the authorization is  terminated or revoked. Performed at [facility], [address] [place], [place] [address]   MRSA PCR Screening     Status: None   Collection Time: 02/07/20  3:01 PM   Specimen: Nasal Mucosa; Nasopharyngeal  Result Value Ref Range Status   MRSA by PCR NEGATIVE NEGATIVE Final     Comment:        The GeneXpert MRSA Assay (FDA approved for NASAL specimens only), is one component of a comprehensive MRSA colonization surveillance program. It is not intended to diagnose MRSA infection nor to guide or monitor treatment for MRSA infections. Performed at [facility], [address], [place],  [address]          Radiology Studies: PERIPHERAL VASCULAR CATHETERIZATION  Result Date: 02/08/2020 See op note  DG Chest Portable 1 View  Result Date: 02/07/2020 CLINICAL DATA:  Shortness of breath. EXAM: PORTABLE CHEST 1 VIEW COMPARISON:  June 27, 2019. FINDINGS: Stable cardiomegaly. No pneumothorax or pleural effusion is noted. Mild central pulmonary vascular congestion is noted. Bibasilar opacities are noted concerning for edema or possibly atelectasis. Bony thorax is unremarkable. IMPRESSION: Stable cardiomegaly with mild central pulmonary vascular congestion. Bibasilar opacities are noted concerning for edema or possibly atelectasis. Electronically Signed   By: Marijo Conception M.D.   On: 02/07/2020 08:18      Scheduled Meds: . [MAR Hold] allopurinol  300 mg Oral Daily  . [MAR Hold] amLODipine  10 mg Oral Daily  . [MAR Hold] atorvastatin  20 mg Oral Daily  . [MAR Hold] Chlorhexidine Gluconate Cloth  6 each Topical Q0600  . [MAR Hold] citalopram  20 mg Oral Daily  . [MAR Hold] docusate sodium  200 mg Oral Daily  . fentaNYL      . [MAR Hold] furosemide  80 mg Oral Daily  . [MAR Hold] heparin  5,000 Units Subcutaneous Q8H  . heparin sodium (porcine)      . [MAR Hold] hydrALAZINE  50 mg Oral Q8H  . [MAR Hold] insulin aspart  0-6 Units Subcutaneous TID WC  . [MAR Hold] insulin aspart  2 Units Subcutaneous TID WC  . [MAR Hold] lidocaine  1 patch Transdermal Q12H  . [MAR Hold] loratadine  10 mg Oral Daily  . [MAR Hold] losartan  100 mg Oral Daily  . [MAR Hold] magnesium oxide  400 mg Oral Daily  . [MAR Hold] metoprolol succinate  25 mg Oral  Daily  . midazolam      . [MAR Hold] multivitamin  1 tablet Oral Daily  . [MAR Hold] sodium chloride flush  3 mL Intravenous Q12H  . [MAR Hold] tamsulosin  0.4 mg Oral Daily   Continuous Infusions: . [MAR Hold] sodium chloride    . sodium chloride 10 mL/hr at 02/08/20 1155     LOS: 1 day      Debbe Odea, MD [facility] Pager: [email or web address] 02/08/2020, 1:58 PM

## 2020-02-08 NOTE — Progress Notes (Signed)
Treatment paused due to inability to maintain BFR. MD made aware and order to use CVC to continue treatment. Blood returned and CVC accessed by S. Dalbert Batman, RN. Needles removed from AVF. Pt is aware of happenings. Denies pain.

## 2020-02-08 NOTE — Progress Notes (Signed)
BP treatment initiated. Order for 16 gauge needles per Dr. Holley Raring

## 2020-02-08 NOTE — Progress Notes (Signed)
BFR less than ordered due to increased AP. Pt with eyes closed but arousable to verbal stimuli.

## 2020-02-08 NOTE — Progress Notes (Signed)
[facility]  ROUNDING NOTE   Subjective:  Patient feeling better today. Did have some salty food to eat over the weekend. Due for dialysis again today as well. Patient for fistulogram today as well.   Objective:  Vital signs in last 24 hours:  Temp:  [97.2 F (36.2 C)-99.2 F (37.3 C)] 98.9 F (37.2 C) (04/19 1137) Pulse Rate:  [56-72] 71 (04/19 1400) Resp:  [4-20] 18 (04/19 1400) BP: (156-192)/(70-89) 179/76 (04/19 1400) SpO2:  [94 %-99 %] 95 % (04/19 1400) Weight:  [124.4 kg] 124.4 kg (04/18 1500)  Weight change:  Filed Weights   02/07/20 0731 02/07/20 1500  Weight: 113.4 kg 124.4 kg    Intake/Output: I/O last 3 completed shifts: In: 3 [I.V.:3] Out: 1000 [Urine:200; Other:800]   Intake/Output this shift:  Total I/O In: -  Out: 125 [Urine:125]  Physical Exam: General: No acute distress  Head: Normocephalic, atraumatic. Moist oral mucosal membranes  Eyes: Anicteric  Neck: Supple, trachea midline  Lungs:  Basilar rales, normal effort  Heart: S1S2 no rubs  Abdomen:  Soft, nontender, bowel sounds present  Extremities: Trace peripheral edema.  Neurologic: Awake, alert, following commands  Skin: No lesions  Access: Clotted right upper extremity AV graft, right temporary femoral catheter     Basic Metabolic Panel: Recent Labs  Lab 02/07/20 0723 02/08/20 0342  NA 136 140  K 3.8 4.3  CL 99 100  CO2 26 29  GLUCOSE 302* 108*  BUN 54* 50*  CREATININE 9.36* 8.78*  CALCIUM 8.7* 8.8*    Liver Function Tests: Recent Labs  Lab 02/07/20 0723  AST 30  ALT 30  ALKPHOS 76  BILITOT 0.8  PROT 7.6  ALBUMIN 3.7   No results for input(s): LIPASE, AMYLASE in the last 168 hours. No results for input(s): AMMONIA in the last 168 hours.  CBC: Recent Labs  Lab 02/07/20 0723  WBC 6.6  NEUTROABS 3.0  HGB 11.4*  HCT 37.0*  MCV 93.7  PLT 161    Cardiac Enzymes: No results for input(s): CKTOTAL, CKMB, CKMBINDEX, TROPONINI in the last 168  hours.  BNP: Invalid input(s): POCBNP  CBG: Recent Labs  Lab 02/07/20 1435 02/07/20 1608 02/08/20 0738 02/08/20 1149  GLUCAP 150* 134* 100* 106*    Microbiology: Results for orders placed or performed during the hospital encounter of 02/07/20  Culture, blood (Routine x 2)     Status: None (Preliminary result)   Collection Time: 02/07/20  7:23 AM   Specimen: BLOOD  Result Value Ref Range Status   Specimen Description BLOOD BLOOD LEFT FOREARM  Final   Special Requests   Final    BOTTLES DRAWN AEROBIC AND ANAEROBIC Blood Culture adequate volume   Culture   Final    NO GROWTH < 24 HOURS Performed at [facility], [address] [place], [place] [address]    Report Status PENDING  Incomplete  Culture, blood (Routine x 2)     Status: None (Preliminary result)   Collection Time: 02/07/20  7:23 AM   Specimen: BLOOD  Result Value Ref Range Status   Specimen Description BLOOD LEFT WRIST  Final   Special Requests   Final    BOTTLES DRAWN AEROBIC AND ANAEROBIC Blood Culture adequate volume   Culture   Final    NO GROWTH < 24 HOURS Performed at [facility], [address] [place], [place] [address]    Report Status PENDING  Incomplete  Respiratory Panel by RT PCR (Flu A&B, Covid) -  Nasopharyngeal Swab     Status: None   Collection Time: 02/07/20  8:20 AM   Specimen: Nasopharyngeal Swab  Result Value Ref Range Status   SARS Coronavirus 2 by RT PCR NEGATIVE NEGATIVE Final    Comment: (NOTE) SARS-CoV-2 target nucleic acids are NOT DETECTED. The SARS-CoV-2 RNA is generally detectable in upper respiratoy specimens during the acute phase of infection. The lowest concentration of SARS-CoV-2 viral copies this assay can detect is 131 copies/mL. A negative result does not preclude SARS-Cov-2 infection and should not be used as the sole basis for treatment or other patient management decisions. A negative result may occur with  improper specimen  collection/handling, submission of specimen other than nasopharyngeal swab, presence of viral mutation(s) within the areas targeted by this assay, and inadequate number of viral copies (<131 copies/mL). A negative result must be combined with clinical observations, patient history, and epidemiological information. The expected result is Negative. Fact Sheet for Patients:  PinkCheek.be Fact Sheet for Healthcare Providers:  GravelBags.it This test is not yet ap proved or cleared by the [place] FDA and  has been authorized for detection and/or diagnosis of SARS-CoV-2 by FDA under an Emergency Use Authorization (EUA). This EUA will remain  in effect (meaning this test can be used) for the duration of the COVID-19 declaration under Section 564(b)(1) of the Act, 21 U.S.C. section 360bbb-3(b)(1), unless the authorization is terminated or revoked sooner.    Influenza A by PCR NEGATIVE NEGATIVE Final   Influenza B by PCR NEGATIVE NEGATIVE Final    Comment: (NOTE) The Xpert Xpress SARS-CoV-2/FLU/RSV assay is intended as an aid in  the diagnosis of influenza from Nasopharyngeal swab specimens and  should not be used as a sole basis for treatment. Nasal washings and  aspirates are unacceptable for Xpert Xpress SARS-CoV-2/FLU/RSV  testing. Fact Sheet for Patients: PinkCheek.be Fact Sheet for Healthcare Providers: GravelBags.it This test is not yet approved or cleared by the [place] FDA and  has been authorized for detection and/or diagnosis of SARS-CoV-2 by  FDA under an Emergency Use Authorization (EUA). This EUA will remain  in effect (meaning this test can be used) for the duration of the  Covid-19 declaration under Section 564(b)(1) of the Act, 21  U.S.C. section 360bbb-3(b)(1), unless the authorization is  terminated or revoked. Performed at [facility],  [address] [place], [place] [address]   MRSA PCR Screening     Status: None   Collection Time: 02/07/20  3:01 PM   Specimen: Nasal Mucosa; Nasopharyngeal  Result Value Ref Range Status   MRSA by PCR NEGATIVE NEGATIVE Final    Comment:        The GeneXpert MRSA Assay (FDA approved for NASAL specimens only), is one component of a comprehensive MRSA colonization surveillance program. It is not intended to diagnose MRSA infection nor to guide or monitor treatment for MRSA infections. Performed at [facility], [address] [place], [place] [address]     Coagulation Studies: Recent Labs    02/07/20 0723  LABPROT 13.1  INR 1.0    Urinalysis: Recent Labs    02/07/20 0938  COLORURINE YELLOW*  LABSPEC 1.010  PHURINE 9.0*  GLUCOSEU NEGATIVE  HGBUR NEGATIVE  BILIRUBINUR NEGATIVE  KETONESUR NEGATIVE  PROTEINUR >=300*  NITRITE NEGATIVE  LEUKOCYTESUR TRACE*      Imaging: PERIPHERAL VASCULAR CATHETERIZATION  Result Date: 02/08/2020 See op note  DG Chest Portable 1 View  Result Date: 02/07/2020 CLINICAL DATA:  Shortness of  breath. EXAM: PORTABLE CHEST 1 VIEW COMPARISON:  June 27, 2019. FINDINGS: Stable cardiomegaly. No pneumothorax or pleural effusion is noted. Mild central pulmonary vascular congestion is noted. Bibasilar opacities are noted concerning for edema or possibly atelectasis. Bony thorax is unremarkable. IMPRESSION: Stable cardiomegaly with mild central pulmonary vascular congestion. Bibasilar opacities are noted concerning for edema or possibly atelectasis. Electronically Signed   By: Marijo Conception M.D.   On: 02/07/2020 08:18     Medications:   . sodium chloride     . allopurinol  300 mg Oral Daily  . amLODipine  10 mg Oral Daily  . atorvastatin  20 mg Oral Daily  . Chlorhexidine Gluconate Cloth  6 each Topical Q0600  . citalopram  20 mg Oral Daily  . docusate sodium  200 mg Oral Daily  . fentaNYL      . furosemide  80 mg Oral  Daily  . heparin  5,000 Units Subcutaneous Q8H  . heparin sodium (porcine)      . hydrALAZINE  50 mg Oral Q8H  . insulin aspart  0-6 Units Subcutaneous TID WC  . insulin aspart  2 Units Subcutaneous TID WC  . lidocaine  1 patch Transdermal Q12H  . loratadine  10 mg Oral Daily  . losartan  100 mg Oral Daily  . magnesium oxide  400 mg Oral Daily  . metoprolol succinate  25 mg Oral Daily  . midazolam      . multivitamin  1 tablet Oral Daily  . sodium chloride flush  3 mL Intravenous Q12H  . tamsulosin  0.4 mg Oral Daily   sodium chloride, acetaminophen, colchicine, cyclobenzaprine, HYDROmorphone (DILAUDID) injection, lactulose, lidocaine-prilocaine, ondansetron (ZOFRAN) IV, ondansetron (ZOFRAN) IV, sodium chloride flush  Assessment/ Plan:  64 y.o. male with past medical history of ESRD on HD MWF, congestive heart failure, diabetes mellitus type 2, gout, hyperlipidemia, hypertension, history of migraine headaches, anemia chronic kidney disease, secondary hyperparathyroidism who was admitted with significant shortness of breath, acute respiratory failure secondary to pulmonary edema.  [facility]/[address]/MWF/118.5  1.  ESRD on HD MWF.  Patient due for hemodialysis treatment today per his usual schedule.  He has a clotted AV graft.  Vascular surgery taken for fistulogram today.  2.  Acute respiratory failure.  Much improved.  We do plan for additional ultrafiltration to treat his underlying pulmonary edema.  3.  Anemia chronic kidney disease.  Hemoglobin 11.4 at last check.  Hold off on Epogen for now.  4.  Secondary hyperparathyroidism.  Check serum phosphorus with dialysis treatment today.  5.  Hypertension.  We are adding amlodipine 10 mg daily to his medication regimen and increasing hydralazine to 50 mg 3 times daily.  LOS: 1 Leather Estis 4/19/20212:34 PM

## 2020-02-08 NOTE — Progress Notes (Signed)
BFR lower than ordered due to increased AP.
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
## 2020-02-09 ENCOUNTER — Ambulatory Visit (INDEPENDENT_AMBULATORY_CARE_PROVIDER_SITE_OTHER): Payer: Medicare HMO | Admitting: Nurse Practitioner

## 2020-02-09 ENCOUNTER — Encounter (INDEPENDENT_AMBULATORY_CARE_PROVIDER_SITE_OTHER): Payer: Medicare HMO

## 2020-02-09 ENCOUNTER — Other Ambulatory Visit (INDEPENDENT_AMBULATORY_CARE_PROVIDER_SITE_OTHER): Payer: Self-pay | Admitting: Vascular Surgery

## 2020-02-09 ENCOUNTER — Encounter: Payer: Self-pay | Admitting: Cardiology

## 2020-02-09 DIAGNOSIS — J810 Acute pulmonary edema: Secondary | ICD-10-CM

## 2020-02-09 DIAGNOSIS — R0602 Shortness of breath: Secondary | ICD-10-CM

## 2020-02-09 LAB — HEPATITIS PANEL, ACUTE
HCV Ab: NONREACTIVE
Hep A IgM: NONREACTIVE
Hep B C IgM: NONREACTIVE
Hepatitis B Surface Ag: NONREACTIVE

## 2020-02-09 LAB — ECHOCARDIOGRAM COMPLETE
Height: 73 in
Weight: 4388.04 oz

## 2020-02-09 LAB — BASIC METABOLIC PANEL
Anion gap: 10 (ref 5–15)
BUN: 50 mg/dL — ABNORMAL HIGH (ref 8–23)
CO2: 28 mmol/L (ref 22–32)
Calcium: 9 mg/dL (ref 8.9–10.3)
Chloride: 100 mmol/L (ref 98–111)
Creatinine, Ser: 7.81 mg/dL — ABNORMAL HIGH (ref 0.61–1.24)
GFR calc Af Amer: 8 mL/min — ABNORMAL LOW (ref >60)
GFR calc non Af Amer: 7 mL/min — ABNORMAL LOW (ref >60)
Glucose, Bld: 180 mg/dL — ABNORMAL HIGH (ref 70–99)
Potassium: 4.8 mmol/L (ref 3.5–5.1)
Sodium: 138 mmol/L (ref 135–145)

## 2020-02-09 LAB — GLUCOSE, CAPILLARY
Glucose-Capillary: 163 mg/dL — ABNORMAL HIGH (ref 70–99)
Glucose-Capillary: 174 mg/dL — ABNORMAL HIGH (ref 70–99)
Glucose-Capillary: 153 mg/dL — ABNORMAL HIGH (ref 70–99)
Glucose-Capillary: 176 mg/dL — ABNORMAL HIGH (ref 70–99)

## 2020-02-09 MED ORDER — AMLODIPINE BESYLATE 10 MG PO TABS: 10.0000 mg | ORAL_TABLET | Freq: Every day | ORAL | 0 refills | Status: DC

## 2020-02-09 MED ORDER — CLONIDINE HCL 0.2 MG PO TABS: 0.2000 mg | ORAL_TABLET | Freq: Two times a day (BID) | ORAL | 11 refills | Status: DC

## 2020-02-09 MED ORDER — CLONIDINE HCL 0.1 MG PO TABS
0.2000 mg | ORAL_TABLET | Freq: Two times a day (BID) | ORAL | Status: DC
  Administered 2020-02-09 – 2020-02-11 (×5): 0.2 mg via ORAL
  Filled 2020-02-09 (×5): qty 2

## 2020-02-09 MED ORDER — HYDRALAZINE HCL 50 MG PO TABS: 50.0000 mg | ORAL_TABLET | Freq: Three times a day (TID) | ORAL | 0 refills | Status: DC

## 2020-02-09 NOTE — Progress Notes (Addendum)
PROGRESS NOTE    Richard Chen   VOZ:[id number]  DOB: 10/19/1956  DOA: 02/07/2020 PCP: Venia Carbon, MD   Brief Narrative:  Richard Chen is a 64 y.o. male with medical history significant for end-stage renal disease (dialysis days Monday/Wednesday/Friday), history of diabetes mellitus, hypertension, dyslipidemia who presents to the emergency room via EMS for evaluation of worsening shortness of breath and decreased responsiveness. When patient presented to the emergency room he was noted to be in respiratory distress, sitting up in bed tachypneic with audible rhonchi.   His blood pressure was significantly elevated in the emergency room with systolic blood pressure of 425 systolic.  Patient received a dose of IV hydralazine 20 mg and an inch of Nitropaste was applied to his anterior chest wall.  Patient was placed on BiPAP in the emergency room to reduce his work of breathing.   Subjective:35 No further dyspnea or chest pain  Assessment & Plan:   Principal Problem:   Acute on chronic diastolic CHF (congestive heart failure)/ Acute respiratory failure  Suspected above was due to Hypertensive emergency Acute on chronic diastolic CHF (congestive heart failure)  Acute respiratory failure  Suspected above was due toHypertensiveemergency - he was dialyzed x 2 in the hospital- today he has no hypoxia or dyspnea on exertion - needs BP meds optimized- allowing renal to manage this - following medications recommended by renal: Add Amlodipine 10 mg daily Increase Hydralazine to 50 TID Added Clonidine 0.2 BID - I have asked the patient to check his BP daily and keep a record of it- He has a monitor at home - I ordered Clonidine and discussed plan with nephrology today   Active Problems:   End stage renal disease on dialysis- SHPT Right arm access clotted - MWF dialysis- has not missed any treatments - temp cath placed on admission and dialyzed - graft access declotted  4/19 - I was told by renal that he will need a perm cath tomorrow as his dialysis access is no longer working      History of anemia due to chronic kidney disease - follow Hb     Time spent in minutes: 35 DVT prophylaxis: Heparin Code Status: Full code Family Communication:  Disposition Plan:  Status is: Inpatient  Remains inpatient appropriate because:Inpatient level of care appropriate due to severity of illness   - needs new permanent access as graft not working   Dispo: The patient is from: Home              Anticipated d/c is to: Home              Anticipated d/c date is: 1 day              Patient currently is not medically stable to d/c.    Consultants:   stable Procedures:   Declot of dialysis access  Temp dialysis cath placed Antimicrobials:  Anti-infectives (From admission, onward)   Start     Dose/Rate Route Frequency Ordered Stop   02/08/20 1115  ceFAZolin (ANCEF) IVPB 1 g/50 mL premix    Note to Pharmacy: To be given in specials   1 g 100 mL/hr over 30 Minutes Intravenous  Once 02/08/20 1114 02/08/20 1836       Objective: Vitals:   02/09/20 1100 02/09/20 1200 02/09/20 1248 02/09/20 1300  BP: (!) 167/73 (!) 179/73 (!) 179/73 (!) 159/70  Pulse: (!) 56 (!) 52  (!) 109  Resp: 15 11  16  Temp:  (!) 97.4 F (36.3 C)    TempSrc:  Axillary    SpO2: 99% 100%  98%  Weight:      Height:        Intake/Output Summary (Last 24 hours) at 02/09/2020 1427 Last data filed at 02/09/2020 1200 Gross per 24 hour  Intake 120 ml  Output -1350 ml  Net 1470 ml   Filed Weights   02/07/20 0731 02/07/20 1500 02/09/20 0500  Weight: 113.4 kg 124.4 kg 117.4 kg    Examination: General exam: Appears comfortable  HEENT: PERRLA, oral mucosa moist, no sclera icterus or thrush Respiratory system: Clear to auscultation. Respiratory effort normal. Cardiovascular system: S1 & S2 heard,  No murmurs  Gastrointestinal system: Abdomen soft, non-tender, nondistended. Normal  bowel sounds   Central nervous system: Alert and oriented. No focal neurological deficits. Extremities: No cyanosis, clubbing or edema Skin: No rashes or ulcers Psychiatry:  Mood & affect appropriate.   Data Reviewed: I have personally reviewed following labs and imaging studies  CBC: Recent Labs  Lab 02/07/20 0723  WBC 6.6  NEUTROABS 3.0  HGB 11.4*  HCT 37.0*  MCV 93.7  PLT 035   Basic Metabolic Panel: Recent Labs  Lab 02/07/20 0723 02/08/20 0342 02/09/20 0409  NA 136 140 138  K 3.8 4.3 4.8  CL 99 100 100  CO2 26 29 28   GLUCOSE 302* 108* 180*  BUN 54* 50* 50*  CREATININE 9.36* 8.78* 7.81*  CALCIUM 8.7* 8.8* 9.0   GFR: Estimated Creatinine Clearance: 12.5 mL/min (A) (by C-G formula based on SCr of 7.81 mg/dL (H)). Liver Function Tests: Recent Labs  Lab 02/07/20 0723  AST 30  ALT 30  ALKPHOS 76  BILITOT 0.8  PROT 7.6  ALBUMIN 3.7   No results for input(s): LIPASE, AMYLASE in the last 168 hours. No results for input(s): AMMONIA in the last 168 hours. Coagulation Profile: Recent Labs  Lab 02/07/20 0723  INR 1.0   Cardiac Enzymes: No results for input(s): CKTOTAL, CKMB, CKMBINDEX, TROPONINI in the last 168 hours. BNP (last 3 results) No results for input(s): PROBNP in the last 8760 hours. HbA1C: Recent Labs    02/07/20 0723  HGBA1C 6.2*   CBG: Recent Labs  Lab 02/08/20 0738 02/08/20 1149 02/08/20 2205 02/09/20 0740 02/09/20 1126  GLUCAP 100* 106* 321* 163* 174*   Lipid Profile: No results for input(s): CHOL, HDL, LDLCALC, TRIG, CHOLHDL, LDLDIRECT in the last 72 hours. Thyroid Function Tests: No results for input(s): TSH, T4TOTAL, FREET4, T3FREE, THYROIDAB in the last 72 hours. Anemia Panel: No results for input(s): VITAMINB12, FOLATE, FERRITIN, TIBC, IRON, RETICCTPCT in the last 72 hours. Urine analysis:    Component Value Date/Time   COLORURINE YELLOW (A) 02/07/2020 0938   APPEARANCEUR CLEAR (A) 02/07/2020 0938   APPEARANCEUR Clear  01/26/2020 1325   LABSPEC 1.010 02/07/2020 0938   PHURINE 9.0 (H) 02/07/2020 0938   GLUCOSEU NEGATIVE 02/07/2020 0938   HGBUR NEGATIVE 02/07/2020 0938   HGBUR negative 01/24/2010 1054   BILIRUBINUR NEGATIVE 02/07/2020 0938   BILIRUBINUR Negative 01/26/2020 1325   KETONESUR NEGATIVE 02/07/2020 0938   PROTEINUR >=300 (A) 02/07/2020 0938   UROBILINOGEN 0.2 08/27/2012 1438   UROBILINOGEN 0.2 01/24/2010 1054   NITRITE NEGATIVE 02/07/2020 0938   LEUKOCYTESUR TRACE (A) 02/07/2020 0938   Sepsis Labs: @LABRCNTIP (procalcitonin:4,lacticidven:4) ) Recent Results (from the past 240 hour(s))  Culture, blood (Routine x 2)     Status: None (Preliminary result)   Collection Time: 02/07/20  7:23 AM  Specimen: BLOOD  Result Value Ref Range Status   Specimen Description BLOOD BLOOD LEFT FOREARM  Final   Special Requests   Final    BOTTLES DRAWN AEROBIC AND ANAEROBIC Blood Culture adequate volume   Culture   Final    NO GROWTH 2 DAYS Performed at [facility], [address], [place], [place] [address]    Report Status PENDING  Incomplete  Culture, blood (Routine x 2)     Status: None (Preliminary result)   Collection Time: 02/07/20  7:23 AM   Specimen: BLOOD  Result Value Ref Range Status   Specimen Description BLOOD LEFT WRIST  Final   Special Requests   Final    BOTTLES DRAWN AEROBIC AND ANAEROBIC Blood Culture adequate volume   Culture   Final    NO GROWTH 2 DAYS Performed at [facility], [address], [place], [place] [address]    Report Status PENDING  Incomplete  Respiratory Panel by RT PCR (Flu A&B, Covid) - Nasopharyngeal Swab     Status: None   Collection Time: 02/07/20  8:20 AM   Specimen: Nasopharyngeal Swab  Result Value Ref Range Status   SARS Coronavirus 2 by RT PCR NEGATIVE NEGATIVE Final    Comment: (NOTE) SARS-CoV-2 target nucleic acids are NOT DETECTED. The SARS-CoV-2 RNA is generally detectable in upper respiratoy specimens during the acute  phase of infection. The lowest concentration of SARS-CoV-2 viral copies this assay can detect is 131 copies/mL. A negative result does not preclude SARS-Cov-2 infection and should not be used as the sole basis for treatment or other patient management decisions. A negative result may occur with  improper specimen collection/handling, submission of specimen other than nasopharyngeal swab, presence of viral mutation(s) within the areas targeted by this assay, and inadequate number of viral copies (<131 copies/mL). A negative result must be combined with clinical observations, patient history, and epidemiological information. The expected result is Negative. Fact Sheet for Patients:  PinkCheek.be Fact Sheet for Healthcare Providers:  GravelBags.it This test is not yet ap proved or cleared by the [place] FDA and  has been authorized for detection and/or diagnosis of SARS-CoV-2 by FDA under an Emergency Use Authorization (EUA). This EUA will remain  in effect (meaning this test can be used) for the duration of the COVID-19 declaration under Section 564(b)(1) of the Act, 21 U.S.C. section 360bbb-3(b)(1), unless the authorization is terminated or revoked sooner.    Influenza A by PCR NEGATIVE NEGATIVE Final   Influenza B by PCR NEGATIVE NEGATIVE Final    Comment: (NOTE) The Xpert Xpress SARS-CoV-2/FLU/RSV assay is intended as an aid in  the diagnosis of influenza from Nasopharyngeal swab specimens and  should not be used as a sole basis for treatment. Nasal washings and  aspirates are unacceptable for Xpert Xpress SARS-CoV-2/FLU/RSV  testing. Fact Sheet for Patients: PinkCheek.be Fact Sheet for Healthcare Providers: GravelBags.it This test is not yet approved or cleared by the [place] FDA and  has been authorized for detection and/or diagnosis of SARS-CoV-2 by    FDA under an Emergency Use Authorization (EUA). This EUA will remain  in effect (meaning this test can be used) for the duration of the  Covid-19 declaration under Section 564(b)(1) of the Act, 21  U.S.C. section 360bbb-3(b)(1), unless the authorization is  terminated or revoked. Performed at [facility], [address], [place], [place] [address]   MRSA PCR Screening     Status: None   Collection Time: 02/07/20  3:01 PM   Specimen: Nasal Mucosa; Nasopharyngeal  Result Value Ref Range Status   MRSA by PCR NEGATIVE NEGATIVE Final    Comment:        The GeneXpert MRSA Assay (FDA approved for NASAL specimens only), is one component of a comprehensive MRSA colonization surveillance program. It is not intended to diagnose MRSA infection nor to guide or monitor treatment for MRSA infections. Performed at [facility], [address] [place], [place] [address]          Radiology Studies: PERIPHERAL VASCULAR CATHETERIZATION  Result Date: 02/08/2020 See op note  ECHOCARDIOGRAM COMPLETE  Result Date: 02/09/2020    ECHOCARDIOGRAM REPORT   Patient Name:   JULUIS FITZSIMMONS Date of Exam: 02/08/2020 Medical Rec #:  [id number]       Height:       73.0 in Accession #:    [id number]      Weight:       274.3 lb Date of Birth:  Jul 27, 1956       BSA:          2.461 m Patient Age:    13 years        BP:           179/76 mmHg Patient Gender: M               HR:           68 bpm. Exam Location:  [facility] Procedure: 2D Echo, Cardiac Doppler and Color Doppler Indications:     CHF 428.31  History:         Patient has no prior history of Echocardiogram examinations.                  CHF; Risk Factors:Hypertension.  Sonographer:     Alyse Low Roar Referring Phys:  [place] Diagnosing Phys: Serafina Royals MD IMPRESSIONS  1. Left ventricular ejection fraction, by estimation, is 35 to 40%. The left ventricle has moderately decreased function. The left ventricle demonstrates regional  wall motion abnormalities (see scoring diagram/findings for description). Left ventricular  diastolic parameters were normal.  2. Right ventricular systolic function is normal. The right ventricular size is normal. There is mildly elevated pulmonary artery systolic pressure.  3. The mitral valve is normal in structure. Mild mitral valve regurgitation.  4. The aortic valve is normal in structure. Aortic valve regurgitation is not visualized. FINDINGS  Left Ventricle: Left ventricular ejection fraction, by estimation, is 35 to 40%. The left ventricle has moderately decreased function. The left ventricle demonstrates regional wall motion abnormalities. Moderate hypokinesis of the left ventricular, mid-apical lateral wall and inferior wall. The left ventricular internal cavity size was normal in size. There is no left ventricular hypertrophy. Left ventricular diastolic parameters were normal. Right Ventricle: The right ventricular size is normal. No increase in right ventricular wall thickness. Right ventricular systolic function is normal. There is mildly elevated pulmonary artery systolic pressure. The tricuspid regurgitant velocity is 2.66  m/s, and with an assumed right atrial pressure of 10 mmHg, the estimated right ventricular systolic pressure is 76.2 mmHg. Left Atrium: Left atrial size was normal in size. Right Atrium: Right atrial size was normal in size. Pericardium: There is no evidence of pericardial effusion. Mitral Valve: The mitral valve is normal in structure. Mild mitral valve regurgitation. Tricuspid Valve: The tricuspid valve is normal in structure. Tricuspid valve regurgitation is mild. Aortic Valve: The aortic valve is normal in structure. Aortic valve regurgitation  is not visualized. Aortic valve mean gradient measures 6.0 mmHg. Aortic valve peak gradient measures 11.8 mmHg. Aortic valve area, by VTI measures 2.30 cm. Pulmonic Valve: The pulmonic valve was normal in structure. Pulmonic valve  regurgitation is trivial. Aorta: The aortic root and ascending aorta are structurally normal, with no evidence of dilitation. IAS/Shunts: No atrial level shunt detected by color flow Doppler.  LEFT VENTRICLE PLAX 2D LVIDd:         5.71 cm      Diastology LVIDs:         4.38 cm      LV e' lateral:   5.98 cm/s LV PW:         1.23 cm      LV E/e' lateral: 10.3 LV IVS:        1.28 cm      LV e' medial:    4.13 cm/s LVOT diam:     2.30 cm      LV E/e' medial:  15.0 LV SV:         77 LV SV Index:   31 LVOT Area:     4.15 cm  LV Volumes (MOD) LV vol d, MOD A2C: 157.0 ml LV vol d, MOD A4C: 188.0 ml LV vol s, MOD A2C: 85.8 ml LV vol s, MOD A4C: 107.0 ml LV SV MOD A2C:     71.2 ml LV SV MOD A4C:     188.0 ml LV SV MOD BP:      79.9 ml RIGHT VENTRICLE RV Mid diam:    3.32 cm RV S prime:     25.10 cm/s LEFT ATRIUM              Index       RIGHT ATRIUM           Index LA diam:        5.10 cm  2.07 cm/m  RA Area:     20.50 cm LA Vol (A2C):   108.0 ml 43.89 ml/m RA Volume:   61.20 ml  24.87 ml/m LA Vol (A4C):   109.0 ml 44.30 ml/m LA Biplane Vol: 114.0 ml 46.33 ml/m  AORTIC VALVE                    PULMONIC VALVE AV Area (Vmax):    2.30 cm     PV Vmax:        1.53 m/s AV Area (Vmean):   2.21 cm     PV Peak grad:   9.4 mmHg AV Area (VTI):     2.30 cm     RVOT Peak grad: 8 mmHg AV Vmax:           172.00 cm/s AV Vmean:          114.500 cm/s AV VTI:            0.336 m AV Peak Grad:      11.8 mmHg AV Mean Grad:      6.0 mmHg LVOT Vmax:         95.10 cm/s LVOT Vmean:        60.900 cm/s LVOT VTI:          0.186 m LVOT/AV VTI ratio: 0.55  AORTA Ao Root diam: 3.20 cm MITRAL VALVE               TRICUSPID VALVE MV Area (PHT): 3.16 cm    TR Peak grad:   28.3 mmHg MV Decel Time:  240 msec    TR Vmax:        266.00 cm/s MV E velocity: 61.80 cm/s MV A velocity: 98.00 cm/s  SHUNTS MV E/A ratio:  0.63        Systemic VTI:  0.19 m MV A Prime:    10.4 cm/s   Systemic Diam: 2.30 cm Serafina Royals MD Electronically signed by Serafina Royals  MD Signature Date/Time: 02/09/2020/2:03:54 PM    Final       Scheduled Meds: . allopurinol  300 mg Oral Daily  . amLODipine  10 mg Oral Daily  . atorvastatin  20 mg Oral Daily  . Chlorhexidine Gluconate Cloth  6 each Topical Q0600  . citalopram  20 mg Oral Daily  . cloNIDine  0.2 mg Oral BID  . docusate sodium  200 mg Oral Daily  . furosemide  80 mg Oral Daily  . heparin  5,000 Units Subcutaneous Q8H  . hydrALAZINE  50 mg Oral Q8H  . insulin aspart  0-6 Units Subcutaneous TID WC  . insulin aspart  2 Units Subcutaneous TID WC  . lidocaine  1 patch Transdermal Q12H  . loratadine  10 mg Oral Daily  . losartan  100 mg Oral Daily  . magnesium oxide  400 mg Oral Daily  . metoprolol succinate  25 mg Oral Daily  . multivitamin  1 tablet Oral Daily  . sodium chloride flush  3 mL Intravenous Q12H  . tamsulosin  0.4 mg Oral Daily   Continuous Infusions: . sodium chloride       LOS: 2 days      Debbe Odea, MD [facility] Pager: [email or web address] 02/09/2020, 2:27 PM

## 2020-02-09 NOTE — Progress Notes (Addendum)
[facility] Daily Progress Note   Subjective: 02/08/20: 1.  Right brachial artery to axillary vein arteriovenous graft cannulation under ultrasound guidance twice, in both a retrograde and then antegrade fashion crossing 2.  Right arm shuntogram and central venogram 3.  Catheter directed thrombolysis with 4 mg of TPA  4.  Mechanical thrombectomy to the right brachial artery to axillary vein AV graft with the penumbra CAT 7D catheter 5.  Fogarty embolectomy for residual arterial plug 6.  Percutaneous transluminal angioplasty of arterial anastomosis with 7 mm diameter angioplasty balloon 7.  Percutaneous transluminal angioplasty of the mid and distal graft with 7 mm diameter high-pressure angioplasty balloon 8.  Percutaneous transluminal angioplasty of the right subclavian vein with 10 mm diameter angioplasty balloon  Patient without complaint this AM. States he is to be discharged home today.   Objective: Vitals:   02/09/20 0800 02/09/20 0900 02/09/20 0908 02/09/20 1000  BP: (!) 184/85 (!) 177/71 (!) 177/71 (!) 172/77  Pulse: 64 73 74 63  Resp: 17 11  17   Temp:      TempSrc:      SpO2: 100% 96%  97%  Weight:      Height:        Intake/Output Summary (Last 24 hours) at 02/09/2020 1059 Last data filed at 02/09/2020 1000 Gross per 24 hour  Intake 120 ml  Output -1450 ml  Net 1570 ml   Physical Exam: A&Ox3, NAD CV: RRR Pulmonary: CTA Bilaterally Abdomen: Soft, Nontender, Nondistended Right Groin:  Temporary Dialysis Catheter: Intact, clean and dry Vascular:  Right Upper Extremity: Soft, warm distally to fingers, access site - clean and dry. Good Bruit and thrill.    Laboratory: CBC    Component Value Date/Time   WBC 6.6 02/07/2020 0723   HGB 11.4 (L) 02/07/2020 0723   HCT 37.0 (L) 02/07/2020 0723   PLT 161 02/07/2020 0723   BMET    Component Value Date/Time   NA 138 02/09/2020 0409   K 4.8 02/09/2020 0409   CL 100 02/09/2020 0409   CO2 28  02/09/2020 0409   GLUCOSE 180 (H) 02/09/2020 0409   BUN 50 (H) 02/09/2020 0409   CREATININE 7.81 (H) 02/09/2020 0409   CALCIUM 9.0 02/09/2020 0409   GFRNONAA 7 (L) 02/09/2020 0409   GFRAA 8 (L) 02/09/2020 0409   Assessment/Planning: The patient is a 64 year old male with known history of end-stage renal disease currently maintained by a right upper extremity graft s/p shuntogram due to thrombosis of access - POD#1  1) As per nephrology, access did not dialyze well - had to switch to temporary catheter during dialysis yesterday.  2) Will place permcath tomorrow with Dr. Lucky Cowboy. Discharge has been held.  Discussed with Dr. Ellis Parents Kalis Friese PA-C 02/09/2020 10:59 AM

## 2020-02-09 NOTE — Progress Notes (Signed)
[facility]  ROUNDING NOTE   Subjective:  Patient had declot yesterday however his access was not working very well thereafter. We will need to have vascular surgery reassess his access.    Objective:  Vital signs in last 24 hours:  Temp:  [97.2 F (36.2 C)-99.2 F (37.3 C)] 97.8 F (36.6 C) (04/20 0400) Pulse Rate:  [52-81] 63 (04/20 1000) Resp:  [10-23] 17 (04/20 1000) BP: (153-189)/(63-89) 172/77 (04/20 1000) SpO2:  [93 %-100 %] 97 % (04/20 1000) Weight:  [117.4 kg] 117.4 kg (04/20 0500)  Weight change: 4.001 kg Filed Weights   02/07/20 0731 02/07/20 1500 02/09/20 0500  Weight: 113.4 kg 124.4 kg 117.4 kg    Intake/Output: I/O last 3 completed shifts: In: 3 [I.V.:3] Out: -525 [Urine:375]   Intake/Output this shift:  Total I/O In: 120 [P.O.:120] Out: 0   Physical Exam: General: No acute distress  Head: Normocephalic, atraumatic. Moist oral mucosal membranes  Eyes: Anicteric  Neck: Supple, trachea midline  Lungs:  Basilar rales, normal effort  Heart: S1S2 no rubs  Abdomen:  Soft, nontender, bowel sounds present  Extremities: Trace peripheral edema.  Neurologic: Awake, alert, following commands  Skin: No lesions  Access: Clotted right upper extremity AV graft, right temporary femoral catheter     Basic Metabolic Panel: Recent Labs  Lab 02/07/20 0723 02/08/20 0342 02/09/20 0409  NA 136 140 138  K 3.8 4.3 4.8  CL 99 100 100  CO2 26 29 28   GLUCOSE 302* 108* 180*  BUN 54* 50* 50*  CREATININE 9.36* 8.78* 7.81*  CALCIUM 8.7* 8.8* 9.0    Liver Function Tests: Recent Labs  Lab 02/07/20 0723  AST 30  ALT 30  ALKPHOS 76  BILITOT 0.8  PROT 7.6  ALBUMIN 3.7   No results for input(s): LIPASE, AMYLASE in the last 168 hours. No results for input(s): AMMONIA in the last 168 hours.  CBC: Recent Labs  Lab 02/07/20 0723  WBC 6.6  NEUTROABS 3.0  HGB 11.4*  HCT 37.0*  MCV 93.7  PLT 161    Cardiac Enzymes: No results for input(s):  CKTOTAL, CKMB, CKMBINDEX, TROPONINI in the last 168 hours.  BNP: Invalid input(s): POCBNP  CBG: Recent Labs  Lab 02/07/20 1608 02/08/20 0738 02/08/20 1149 02/08/20 2205 02/09/20 0740  GLUCAP 134* 100* 106* 321* 163*    Microbiology: Results for orders placed or performed during the hospital encounter of 02/07/20  Culture, blood (Routine x 2)     Status: None (Preliminary result)   Collection Time: 02/07/20  7:23 AM   Specimen: BLOOD  Result Value Ref Range Status   Specimen Description BLOOD BLOOD LEFT FOREARM  Final   Special Requests   Final    BOTTLES DRAWN AEROBIC AND ANAEROBIC Blood Culture adequate volume   Culture   Final    NO GROWTH 2 DAYS Performed at [facility], [address], [place], [place] [address]    Report Status PENDING  Incomplete  Culture, blood (Routine x 2)     Status: None (Preliminary result)   Collection Time: 02/07/20  7:23 AM   Specimen: BLOOD  Result Value Ref Range Status   Specimen Description BLOOD LEFT WRIST  Final   Special Requests   Final    BOTTLES DRAWN AEROBIC AND ANAEROBIC Blood Culture adequate volume   Culture   Final    NO GROWTH 2 DAYS Performed at [facility], [address] [place], [place] [address]    Report Status  PENDING  Incomplete  Respiratory Panel by RT PCR (Flu A&B, Covid) - Nasopharyngeal Swab     Status: None   Collection Time: 02/07/20  8:20 AM   Specimen: Nasopharyngeal Swab  Result Value Ref Range Status   SARS Coronavirus 2 by RT PCR NEGATIVE NEGATIVE Final    Comment: (NOTE) SARS-CoV-2 target nucleic acids are NOT DETECTED. The SARS-CoV-2 RNA is generally detectable in upper respiratoy specimens during the acute phase of infection. The lowest concentration of SARS-CoV-2 viral copies this assay can detect is 131 copies/mL. A negative result does not preclude SARS-Cov-2 infection and should not be used as the sole basis for treatment or other patient management decisions. A  negative result may occur with  improper specimen collection/handling, submission of specimen other than nasopharyngeal swab, presence of viral mutation(s) within the areas targeted by this assay, and inadequate number of viral copies (<131 copies/mL). A negative result must be combined with clinical observations, patient history, and epidemiological information. The expected result is Negative. Fact Sheet for Patients:  PinkCheek.be Fact Sheet for Healthcare Providers:  GravelBags.it This test is not yet ap proved or cleared by the [place] FDA and  has been authorized for detection and/or diagnosis of SARS-CoV-2 by FDA under an Emergency Use Authorization (EUA). This EUA will remain  in effect (meaning this test can be used) for the duration of the COVID-19 declaration under Section 564(b)(1) of the Act, 21 U.S.C. section 360bbb-3(b)(1), unless the authorization is terminated or revoked sooner.    Influenza A by PCR NEGATIVE NEGATIVE Final   Influenza B by PCR NEGATIVE NEGATIVE Final    Comment: (NOTE) The Xpert Xpress SARS-CoV-2/FLU/RSV assay is intended as an aid in  the diagnosis of influenza from Nasopharyngeal swab specimens and  should not be used as a sole basis for treatment. Nasal washings and  aspirates are unacceptable for Xpert Xpress SARS-CoV-2/FLU/RSV  testing. Fact Sheet for Patients: PinkCheek.be Fact Sheet for Healthcare Providers: GravelBags.it This test is not yet approved or cleared by the [place] FDA and  has been authorized for detection and/or diagnosis of SARS-CoV-2 by  FDA under an Emergency Use Authorization (EUA). This EUA will remain  in effect (meaning this test can be used) for the duration of the  Covid-19 declaration under Section 564(b)(1) of the Act, 21  U.S.C. section 360bbb-3(b)(1), unless the authorization is   terminated or revoked. Performed at [facility], [address] [place], [place] [address]   MRSA PCR Screening     Status: None   Collection Time: 02/07/20  3:01 PM   Specimen: Nasal Mucosa; Nasopharyngeal  Result Value Ref Range Status   MRSA by PCR NEGATIVE NEGATIVE Final    Comment:        The GeneXpert MRSA Assay (FDA approved for NASAL specimens only), is one component of a comprehensive MRSA colonization surveillance program. It is not intended to diagnose MRSA infection nor to guide or monitor treatment for MRSA infections. Performed at [facility], [address] [place], [place] [address]     Coagulation Studies: Recent Labs    02/07/20 0723  LABPROT 13.1  INR 1.0    Urinalysis: Recent Labs    02/07/20 0938  COLORURINE YELLOW*  LABSPEC 1.010  PHURINE 9.0*  GLUCOSEU NEGATIVE  HGBUR NEGATIVE  BILIRUBINUR NEGATIVE  KETONESUR NEGATIVE  PROTEINUR >=300*  NITRITE NEGATIVE  LEUKOCYTESUR TRACE*      Imaging: PERIPHERAL VASCULAR CATHETERIZATION  Result Date: 02/08/2020 See op note  Medications:   . sodium chloride     . allopurinol  300 mg Oral Daily  . amLODipine  10 mg Oral Daily  . atorvastatin  20 mg Oral Daily  . Chlorhexidine Gluconate Cloth  6 each Topical Q0600  . citalopram  20 mg Oral Daily  . cloNIDine  0.2 mg Oral BID  . docusate sodium  200 mg Oral Daily  . furosemide  80 mg Oral Daily  . heparin  5,000 Units Subcutaneous Q8H  . hydrALAZINE  50 mg Oral Q8H  . insulin aspart  0-6 Units Subcutaneous TID WC  . insulin aspart  2 Units Subcutaneous TID WC  . lidocaine  1 patch Transdermal Q12H  . loratadine  10 mg Oral Daily  . losartan  100 mg Oral Daily  . magnesium oxide  400 mg Oral Daily  . metoprolol succinate  25 mg Oral Daily  . multivitamin  1 tablet Oral Daily  . sodium chloride flush  3 mL Intravenous Q12H  . tamsulosin  0.4 mg Oral Daily   sodium chloride, acetaminophen, colchicine,  cyclobenzaprine, HYDROmorphone (DILAUDID) injection, lactulose, lidocaine-prilocaine, ondansetron (ZOFRAN) IV, ondansetron (ZOFRAN) IV, sodium chloride flush  Assessment/ Plan:  64 y.o. male with past medical history of ESRD on HD MWF, congestive heart failure, diabetes mellitus type 2, gout, hyperlipidemia, hypertension, history of migraine headaches, anemia chronic kidney disease, secondary hyperparathyroidism who was admitted with significant shortness of breath, acute respiratory failure secondary to pulmonary edema.  [facility]/[address]/MWF/118.5  1.  ESRD on HD MWF.  Patient underwent dialysis yesterday.  Tolerated well.  We attempted to use his declotted access yesterday but this did not function very well at all.  Arterial pressures were very high.  We ended up having to use his temporary dialysis catheter yesterday.  We will ask vascular surgery to see the patient again.  2.  Acute respiratory failure.  Resolved now.  Breathing comfortably.  3.  Anemia chronic kidney disease.  Continue to monitor hemoglobin periodically.  4.  Secondary hyperparathyroidism.  Recheck serum phosphorus tomorrow.  5.  Hypertension.  Blood pressure still high at times.  Therefore we will add clonidine 0.2 mg twice daily to his medication regimen.  LOS: 2 Mathilde Mcwherter 4/20/202111:04 AM

## 2020-02-10 ENCOUNTER — Inpatient Hospital Stay
Admission: EM | Disposition: A | Discharge status: Home / Self Care | Payer: Self-pay | Source: Home / Self Care | Attending: Internal Medicine

## 2020-02-10 ENCOUNTER — Encounter: Payer: Self-pay | Admitting: Internal Medicine

## 2020-02-10 DIAGNOSIS — N185 Chronic kidney disease, stage 5: Secondary | ICD-10-CM

## 2020-02-10 HISTORY — PX: DIALYSIS/PERMA CATHETER INSERTION: CATH118288

## 2020-02-10 LAB — GLUCOSE, CAPILLARY
Glucose-Capillary: 132 mg/dL — ABNORMAL HIGH (ref 70–99)
Glucose-Capillary: 116 mg/dL — ABNORMAL HIGH (ref 70–99)
Glucose-Capillary: 172 mg/dL — ABNORMAL HIGH (ref 70–99)
Glucose-Capillary: 263 mg/dL — ABNORMAL HIGH (ref 70–99)
Glucose-Capillary: 279 mg/dL — ABNORMAL HIGH (ref 70–99)

## 2020-02-10 LAB — BASIC METABOLIC PANEL
Anion gap: 9 (ref 5–15)
BUN: 74 mg/dL — ABNORMAL HIGH (ref 8–23)
CO2: 28 mmol/L (ref 22–32)
Calcium: 8.7 mg/dL — ABNORMAL LOW (ref 8.9–10.3)
Chloride: 99 mmol/L (ref 98–111)
Creatinine, Ser: 9.62 mg/dL — ABNORMAL HIGH (ref 0.61–1.24)
GFR calc Af Amer: 6 mL/min — ABNORMAL LOW (ref >60)
GFR calc non Af Amer: 5 mL/min — ABNORMAL LOW (ref >60)
Glucose, Bld: 151 mg/dL — ABNORMAL HIGH (ref 70–99)
Potassium: 4.8 mmol/L (ref 3.5–5.1)
Sodium: 136 mmol/L (ref 135–145)

## 2020-02-10 SURGERY — DIALYSIS/PERMA CATHETER INSERTION
Anesthesia: Choice

## 2020-02-10 MED ORDER — MIDAZOLAM HCL 2 MG/ML PO SYRP: 8.0000 mg | ORAL_SOLUTION | Freq: Once | ORAL | Status: DC | PRN

## 2020-02-10 MED ORDER — FENTANYL CITRATE (PF) 100 MCG/2ML IJ SOLN
INTRAMUSCULAR | Status: DC | PRN
  Administered 2020-02-10: 25 ug via INTRAVENOUS
  Administered 2020-02-10: 50 ug via INTRAVENOUS

## 2020-02-10 MED ORDER — DIPHENHYDRAMINE HCL 50 MG/ML IJ SOLN
INTRAMUSCULAR | Status: AC
  Administered 2020-02-10: 50 mg via INTRAVENOUS
  Filled 2020-02-10: qty 1

## 2020-02-10 MED ORDER — FAMOTIDINE 20 MG PO TABS
ORAL_TABLET | ORAL | Status: AC
  Administered 2020-02-10: 40 mg via ORAL
  Filled 2020-02-10: qty 2

## 2020-02-10 MED ORDER — CEFAZOLIN SODIUM-DEXTROSE 1-4 GM/50ML-% IV SOLN: 1.0000 g | Freq: Once | INTRAVENOUS | Status: DC

## 2020-02-10 MED ORDER — FENTANYL CITRATE (PF) 100 MCG/2ML IJ SOLN
INTRAMUSCULAR | Status: AC
  Filled 2020-02-10: qty 2

## 2020-02-10 MED ORDER — DIPHENHYDRAMINE HCL 50 MG/ML IJ SOLN: 50.0000 mg | Freq: Once | INTRAMUSCULAR | Status: AC | PRN

## 2020-02-10 MED ORDER — HEPARIN SODIUM (PORCINE) 10000 UNIT/ML IJ SOLN
INTRAMUSCULAR | Status: AC
  Filled 2020-02-10: qty 1

## 2020-02-10 MED ORDER — SODIUM CHLORIDE 0.9 % IV SOLN: INTRAVENOUS | Status: DC

## 2020-02-10 MED ORDER — METHYLPREDNISOLONE SODIUM SUCC 125 MG IJ SOLR: 125.0000 mg | Freq: Once | INTRAMUSCULAR | Status: AC | PRN

## 2020-02-10 MED ORDER — ONDANSETRON HCL 4 MG/2ML IJ SOLN: 4.0000 mg | Freq: Four times a day (QID) | INTRAMUSCULAR | Status: DC | PRN

## 2020-02-10 MED ORDER — FAMOTIDINE 20 MG PO TABS: 40.0000 mg | ORAL_TABLET | Freq: Once | ORAL | Status: AC | PRN

## 2020-02-10 MED ORDER — MIDAZOLAM HCL 5 MG/5ML IJ SOLN
INTRAMUSCULAR | Status: AC
  Filled 2020-02-10: qty 5

## 2020-02-10 MED ORDER — MIDAZOLAM HCL 2 MG/2ML IJ SOLN
INTRAMUSCULAR | Status: DC | PRN
  Administered 2020-02-10: 1 mg via INTRAVENOUS
  Administered 2020-02-10: 2 mg via INTRAVENOUS

## 2020-02-10 MED ORDER — CEFAZOLIN SODIUM-DEXTROSE 1-4 GM/50ML-% IV SOLN
INTRAVENOUS | Status: AC
  Filled 2020-02-10: qty 50

## 2020-02-10 MED ORDER — HYDROMORPHONE HCL 1 MG/ML IJ SOLN: 1.0000 mg | Freq: Once | INTRAMUSCULAR | Status: DC | PRN

## 2020-02-10 MED ORDER — METHYLPREDNISOLONE SODIUM SUCC 125 MG IJ SOLR
INTRAMUSCULAR | Status: AC
  Administered 2020-02-10: 125 mg via INTRAVENOUS
  Filled 2020-02-10: qty 2

## 2020-02-10 SURGICAL SUPPLY — 7 items
CATH PALINDROME RT-P 15FX23CM (CATHETERS) ×1 IMPLANT
DRAPE BRACHIAL (DRAPES) ×1 IMPLANT
NDL ENTRY 21GA 7CM ECHOTIP (NEEDLE) IMPLANT
NEEDLE ENTRY 21GA 7CM ECHOTIP (NEEDLE) ×2 IMPLANT
PACK ANGIOGRAPHY (CUSTOM PROCEDURE TRAY) ×1 IMPLANT
SET INTRO CAPELLA COAXIAL (SET/KITS/TRAYS/PACK) ×1 IMPLANT
TOWEL OR 17X26 4PK STRL BLUE (TOWEL DISPOSABLE) ×1 IMPLANT

## 2020-02-10 NOTE — Progress Notes (Signed)
Lines reversed due to inability to maintain AP. Pt with HR ranging from 47-54, Dr. Holley Raring made aware. No orders given, states pt runs like this in the outpatient clinic.

## 2020-02-10 NOTE — Progress Notes (Addendum)
Pre HD  

## 2020-02-10 NOTE — Plan of Care (Addendum)
RN assessment and VS revealed stability for going to procedure for [facility] cath placement.  CHG x2 given.  Metoprolol given with sip - All other meds held since NPO - Zofran IV also given just prior to leaving floor - per patient request.  No pain.  Consent signed and on chart. Wife in room and will remain in room for any updates.Report called s/w Rogelia Mire, RN (Specials). * Informed of low HR in 30s while sleeping (asymptomatic).

## 2020-02-10 NOTE — Care Management Important Message (Signed)
Important Message  Patient Details  Name: Richard Chen MRN: [id number] Date of Birth: [birth year]   Medicare Important Message Given:  N/A - LOS <3 / Initial given by admissions     Richard Chen 02/10/2020, 10:40 AM

## 2020-02-10 NOTE — Progress Notes (Signed)
[facility] PROGRESS NOTE    Progress Note  [name]  LZJ:[id number] DOB: [birth year] DOA: 02/07/2020 PCP: Venia Carbon, MD     Brief Narrative:   Richard Chen is an 64 y.o. male past medical history significant for end-stage renal disease on Monday Wednesdays and Fridays history of diabetes mellitus, essential hypertension presents to the ED via EMS for evaluation of shortness of breath and unresponsiveness.  Level to the ED he was found in respiratory distress tachypneic and audible rhonchi, with a significant elevated blood pressure and he was started on BiPAP in the emergency room.  Assessment/Plan:   Acute respiratory failure with hypoxia due to acute on chronic diastolic CHF (congestive heart failure) ([place]) hypertensive emergency: Patient upon arrival to the ED was hypoxic he was emergently dialyzed. He was started on hydralazine amlodipine and clonidine.  To stabilize it has now been ranging from 138/62-150 7/64.  End stage renal disease on dialysis (HCC)/with clotted right arm axis: Temporary dialysis catheter was placed on 02/07/2020, who clotted. And dialysis was started, access was declotted on 02/08/2019 And central tunnel catheter was inserted on 02/10/2020. Has a thrill in fistula, dialysis femoral catheter will have to be discontinued today after he finishes with his dialysis.  History of anemia due to chronic kidney disease: Likely due to his renal disease further management per renal.   DVT prophylaxis: lovenox Family Communication:none Status is: Inpatient  Remains inpatient appropriate because:Hemodynamically unstable   Dispo: The patient is from: Home              Anticipated d/c is to: Home              Anticipated d/c date is: 2 days              Patient currently is not medically stable to d/c.  Code Status:     Code Status Orders  (From admission, onward)         Start     Ordered   02/07/20 0903  Full code  Continuous     02/07/20 0912        Code Status History    Date Active Date Inactive Code Status Order ID Comments User Context   03/22/2016 2033 03/24/2016 1805 Full Code [id number]  Lance Coon, MD Inpatient   Advance Care Planning Activity        IV Access:    Peripheral IV   Procedures and diagnostic studies:   PERIPHERAL VASCULAR CATHETERIZATION  Result Date: 02/10/2020 See op note  ECHOCARDIOGRAM COMPLETE  Result Date: 02/09/2020    ECHOCARDIOGRAM REPORT   Patient Name:   Richard Chen Date of Exam: 02/08/2020 Medical Rec #:  [id number]       Height:       73.0 in Accession #:    [id number]      Weight:       274.3 lb Date of Birth:  [birth year]       BSA:          2.461 m Patient Age:    50 years        BP:           179/76 mmHg Patient Gender: M               HR:           68 bpm. Exam Location:  [facility] Procedure: 2D Echo, Cardiac Doppler and Color Doppler Indications:     CHF 428.31  History:         Patient has no prior history of Echocardiogram examinations.                  CHF; Risk Factors:Hypertension.  Sonographer:     Alyse Low Roar Referring Phys:  [place] Diagnosing Phys: Serafina Royals MD IMPRESSIONS  1. Left ventricular ejection fraction, by estimation, is 35 to 40%. The left ventricle has moderately decreased function. The left ventricle demonstrates regional wall motion abnormalities (see scoring diagram/findings for description). Left ventricular  diastolic parameters were normal.  2. Right ventricular systolic function is normal. The right ventricular size is normal. There is mildly elevated pulmonary artery systolic pressure.  3. The mitral valve is normal in structure. Mild mitral valve regurgitation.  4. The aortic valve is normal in structure. Aortic valve regurgitation is not visualized. FINDINGS  Left Ventricle: Left ventricular ejection fraction, by estimation, is 35 to 40%. The left ventricle has moderately decreased function. The left ventricle demonstrates regional  wall motion abnormalities. Moderate hypokinesis of the left ventricular, mid-apical lateral wall and inferior wall. The left ventricular internal cavity size was normal in size. There is no left ventricular hypertrophy. Left ventricular diastolic parameters were normal. Right Ventricle: The right ventricular size is normal. No increase in right ventricular wall thickness. Right ventricular systolic function is normal. There is mildly elevated pulmonary artery systolic pressure. The tricuspid regurgitant velocity is 2.66  m/s, and with an assumed right atrial pressure of 10 mmHg, the estimated right ventricular systolic pressure is 40.9 mmHg. Left Atrium: Left atrial size was normal in size. Right Atrium: Right atrial size was normal in size. Pericardium: There is no evidence of pericardial effusion. Mitral Valve: The mitral valve is normal in structure. Mild mitral valve regurgitation. Tricuspid Valve: The tricuspid valve is normal in structure. Tricuspid valve regurgitation is mild. Aortic Valve: The aortic valve is normal in structure. Aortic valve regurgitation is not visualized. Aortic valve mean gradient measures 6.0 mmHg. Aortic valve peak gradient measures 11.8 mmHg. Aortic valve area, by VTI measures 2.30 cm. Pulmonic Valve: The pulmonic valve was normal in structure. Pulmonic valve regurgitation is trivial. Aorta: The aortic root and ascending aorta are structurally normal, with no evidence of dilitation. IAS/Shunts: No atrial level shunt detected by color flow Doppler.  LEFT VENTRICLE PLAX 2D LVIDd:         5.71 cm      Diastology LVIDs:         4.38 cm      LV e' lateral:   5.98 cm/s LV PW:         1.23 cm      LV E/e' lateral: 10.3 LV IVS:        1.28 cm      LV e' medial:    4.13 cm/s LVOT diam:     2.30 cm      LV E/e' medial:  15.0 LV SV:         77 LV SV Index:   31 LVOT Area:     4.15 cm  LV Volumes (MOD) LV vol d, MOD A2C: 157.0 ml LV vol d, MOD A4C: 188.0 ml LV vol s, MOD A2C: 85.8 ml LV vol s,  MOD A4C: 107.0 ml LV SV MOD A2C:     71.2 ml LV SV MOD A4C:     188.0 ml LV SV MOD BP:      79.9 ml RIGHT VENTRICLE RV Mid diam:  3.32 cm RV S prime:     25.10 cm/s LEFT ATRIUM              Index       RIGHT ATRIUM           Index LA diam:        5.10 cm  2.07 cm/m  RA Area:     20.50 cm LA Vol (A2C):   108.0 ml 43.89 ml/m RA Volume:   61.20 ml  24.87 ml/m LA Vol (A4C):   109.0 ml 44.30 ml/m LA Biplane Vol: 114.0 ml 46.33 ml/m  AORTIC VALVE                    PULMONIC VALVE AV Area (Vmax):    2.30 cm     PV Vmax:        1.53 m/s AV Area (Vmean):   2.21 cm     PV Peak grad:   9.4 mmHg AV Area (VTI):     2.30 cm     RVOT Peak grad: 8 mmHg AV Vmax:           172.00 cm/s AV Vmean:          114.500 cm/s AV VTI:            0.336 m AV Peak Grad:      11.8 mmHg AV Mean Grad:      6.0 mmHg LVOT Vmax:         95.10 cm/s LVOT Vmean:        60.900 cm/s LVOT VTI:          0.186 m LVOT/AV VTI ratio: 0.55  AORTA Ao Root diam: 3.20 cm MITRAL VALVE               TRICUSPID VALVE MV Area (PHT): 3.16 cm    TR Peak grad:   28.3 mmHg MV Decel Time: 240 msec    TR Vmax:        266.00 cm/s MV E velocity: 61.80 cm/s MV A velocity: 98.00 cm/s  SHUNTS MV E/A ratio:  0.63        Systemic VTI:  0.19 m MV A Prime:    10.4 cm/s   Systemic Diam: 2.30 cm Serafina Royals MD Electronically signed by Serafina Royals MD Signature Date/Time: 02/09/2020/2:03:54 PM    Final      Medical Consultants:    None.  Anti-Infectives:   None  Subjective:    Lorenz Coaster Roets he relates his breathing is significantly better when than when he was admitted  Objective:    Vitals:   02/10/20 1215 02/10/20 1315 02/10/20 1330 02/10/20 1345  BP: (!) 150/57 133/63 (!) 153/70 (!) 157/64  Pulse: (!) 56 (!) 58 (!) 53 (!) 51  Resp: 15 13 13  (!) 21  Temp:      TempSrc:      SpO2: 95% 95% 97% 95%  Weight:      Height:       SpO2: 95 % O2 Flow Rate (L/min): 2 L/min   Intake/Output Summary (Last 24 hours) at 02/10/2020 1353 Last data  filed at 02/10/2020 0751 Gross per 24 hour  Intake --  Output 350 ml  Net -350 ml   Filed Weights   02/09/20 1200 02/10/20 0530 02/10/20 0944  Weight: 116.9 kg 118.4 kg 118.4 kg    Exam: General exam: In no acute distress. Respiratory system: Good air movement and clear to auscultation. Cardiovascular system:  S1 & S2 heard, RRR. No JVD.  Has a thrill in fistula Gastrointestinal system: Abdomen is nondistended, soft and nontender.  Central nervous system: Alert and oriented. No focal neurological deficits. Extremities: No pedal edema. Skin: No rashes, lesions or ulcers Data Reviewed:    Labs: Basic Metabolic Panel: Recent Labs  Lab 02/07/20 0723 02/07/20 0723 02/08/20 0342 02/08/20 0342 02/09/20 0409 02/10/20 0526  NA 136  --  140  --  138 136  K 3.8   < > 4.3   < > 4.8 4.8  CL 99  --  100  --  100 99  CO2 26  --  29  --  28 28  GLUCOSE 302*  --  108*  --  180* 151*  BUN 54*  --  50*  --  50* 74*  CREATININE 9.36*  --  8.78*  --  7.81* 9.62*  CALCIUM 8.7*  --  8.8*  --  9.0 8.7*   < > = values in this interval not displayed.   GFR Estimated Creatinine Clearance: 10.2 mL/min (A) (by C-G formula based on SCr of 9.62 mg/dL (H)). Liver Function Tests: Recent Labs  Lab 02/07/20 0723  AST 30  ALT 30  ALKPHOS 76  BILITOT 0.8  PROT 7.6  ALBUMIN 3.7   No results for input(s): LIPASE, AMYLASE in the last 168 hours. No results for input(s): AMMONIA in the last 168 hours. Coagulation profile Recent Labs  Lab 02/07/20 0723  INR 1.0   COVID-19 Labs  No results for input(s): DDIMER, FERRITIN, LDH, CRP in the last 72 hours.  Lab Results  Component Value Date   SARSCOV2NAA NEGATIVE 02/07/2020   [place] NEGATIVE 07/10/2019   [place] NEGATIVE 06/26/2019    CBC: Recent Labs  Lab 02/07/20 0723  WBC 6.6  NEUTROABS 3.0  HGB 11.4*  HCT 37.0*  MCV 93.7  PLT 161   Cardiac Enzymes: No results for input(s): CKTOTAL, CKMB, CKMBINDEX, TROPONINI in the  last 168 hours. BNP (last 3 results) No results for input(s): PROBNP in the last 8760 hours. CBG: Recent Labs  Lab 02/09/20 1625 02/09/20 2244 02/10/20 0815 02/10/20 1007 02/10/20 1201  GLUCAP 153* 176* 132* 116* 172*   D-Dimer: No results for input(s): DDIMER in the last 72 hours. Hgb A1c: No results for input(s): HGBA1C in the last 72 hours. Lipid Profile: No results for input(s): CHOL, HDL, LDLCALC, TRIG, CHOLHDL, LDLDIRECT in the last 72 hours. Thyroid function studies: No results for input(s): TSH, T4TOTAL, T3FREE, THYROIDAB in the last 72 hours.  Invalid input(s): FREET3 Anemia work up: No results for input(s): VITAMINB12, FOLATE, FERRITIN, TIBC, IRON, RETICCTPCT in the last 72 hours. Sepsis Labs: Recent Labs  Lab 02/07/20 0723 02/07/20 0955  WBC 6.6  --   LATICACIDVEN 2.5* 1.8   Microbiology Recent Results (from the past 240 hour(s))  Culture, blood (Routine x 2)     Status: None (Preliminary result)   Collection Time: 02/07/20  7:23 AM   Specimen: BLOOD  Result Value Ref Range Status   Specimen Description BLOOD BLOOD LEFT FOREARM  Final   Special Requests   Final    BOTTLES DRAWN AEROBIC AND ANAEROBIC Blood Culture adequate volume   Culture   Final    NO GROWTH 3 DAYS Performed at [facility], [address], [place], [place] [address]    Report Status PENDING  Incomplete  Culture, blood (Routine x 2)     Status: None (Preliminary result)   Collection Time:  02/07/20  7:23 AM   Specimen: BLOOD  Result Value Ref Range Status   Specimen Description BLOOD LEFT WRIST  Final   Special Requests   Final    BOTTLES DRAWN AEROBIC AND ANAEROBIC Blood Culture adequate volume   Culture   Final    NO GROWTH 3 DAYS Performed at [facility], [address], [place], [place] [address]    Report Status PENDING  Incomplete  Respiratory Panel by RT PCR (Flu A&B, Covid) - Nasopharyngeal Swab     Status: None   Collection Time: 02/07/20  8:20 AM    Specimen: Nasopharyngeal Swab  Result Value Ref Range Status   SARS Coronavirus 2 by RT PCR NEGATIVE NEGATIVE Final    Comment: (NOTE) SARS-CoV-2 target nucleic acids are NOT DETECTED. The SARS-CoV-2 RNA is generally detectable in upper respiratoy specimens during the acute phase of infection. The lowest concentration of SARS-CoV-2 viral copies this assay can detect is 131 copies/mL. A negative result does not preclude SARS-Cov-2 infection and should not be used as the sole basis for treatment or other patient management decisions. A negative result may occur with  improper specimen collection/handling, submission of specimen other than nasopharyngeal swab, presence of viral mutation(s) within the areas targeted by this assay, and inadequate number of viral copies (<131 copies/mL). A negative result must be combined with clinical observations, patient history, and epidemiological information. The expected result is Negative. Fact Sheet for Patients:  PinkCheek.be Fact Sheet for Healthcare Providers:  GravelBags.it This test is not yet ap proved or cleared by the [place] FDA and  has been authorized for detection and/or diagnosis of SARS-CoV-2 by FDA under an Emergency Use Authorization (EUA). This EUA will remain  in effect (meaning this test can be used) for the duration of the COVID-19 declaration under Section 564(b)(1) of the Act, 21 U.S.C. section 360bbb-3(b)(1), unless the authorization is terminated or revoked sooner.    Influenza A by PCR NEGATIVE NEGATIVE Final   Influenza B by PCR NEGATIVE NEGATIVE Final    Comment: (NOTE) The Xpert Xpress SARS-CoV-2/FLU/RSV assay is intended as an aid in  the diagnosis of influenza from Nasopharyngeal swab specimens and  should not be used as a sole basis for treatment. Nasal washings and  aspirates are unacceptable for Xpert Xpress SARS-CoV-2/FLU/RSV  testing. Fact Sheet  for Patients: PinkCheek.be Fact Sheet for Healthcare Providers: GravelBags.it This test is not yet approved or cleared by the [place] FDA and  has been authorized for detection and/or diagnosis of SARS-CoV-2 by  FDA under an Emergency Use Authorization (EUA). This EUA will remain  in effect (meaning this test can be used) for the duration of the  Covid-19 declaration under Section 564(b)(1) of the Act, 21  U.S.C. section 360bbb-3(b)(1), unless the authorization is  terminated or revoked. Performed at [facility], [address] [place], [place] [address]   MRSA PCR Screening     Status: None   Collection Time: 02/07/20  3:01 PM   Specimen: Nasal Mucosa; Nasopharyngeal  Result Value Ref Range Status   MRSA by PCR NEGATIVE NEGATIVE Final    Comment:        The GeneXpert MRSA Assay (FDA approved for NASAL specimens only), is one component of a comprehensive MRSA colonization surveillance program. It is not intended to diagnose MRSA infection nor to guide or monitor treatment for MRSA infections. Performed at [facility], [address] [place], [place] [address]      Medications:   .  allopurinol  300 mg Oral Daily  . amLODipine  10 mg Oral Daily  . atorvastatin  20 mg Oral Daily  . Chlorhexidine Gluconate Cloth  6 each Topical Q0600  . citalopram  20 mg Oral Daily  . cloNIDine  0.2 mg Oral BID  . docusate sodium  200 mg Oral Daily  . fentaNYL      . furosemide  80 mg Oral Daily  . heparin      . heparin  5,000 Units Subcutaneous Q8H  . hydrALAZINE  50 mg Oral Q8H  . insulin aspart  0-6 Units Subcutaneous TID WC  . insulin aspart  2 Units Subcutaneous TID WC  . lidocaine  1 patch Transdermal Q12H  . loratadine  10 mg Oral Daily  . losartan  100 mg Oral Daily  . magnesium oxide  400 mg Oral Daily  . metoprolol succinate  25 mg Oral Daily  . midazolam      . multivitamin  1 tablet  Oral Daily  . sodium chloride flush  3 mL Intravenous Q12H  . tamsulosin  0.4 mg Oral Daily   Continuous Infusions: . sodium chloride    . ceFAZolin        LOS: 3 days   Charlynne Cousins  [facility]  02/10/2020, 1:53 PM

## 2020-02-10 NOTE — Op Note (Signed)
OPERATIVE NOTE   PROCEDURE: 1. Insertion of tunneled dialysis catheter left IJ approach with ultrasound and fluoroscopic guidance.  PRE-OPERATIVE DIAGNOSIS: Complication of dialysis access inability to cannulate his right arm brachial axillary graft; end-stage renal disease requiring hemodialysis  POST-OPERATIVE DIAGNOSIS: Same  SURGEON: Hortencia Pilar.  ANESTHESIA: Conscious sedation was administered under my direct supervision by the interventional radiology RN. IV Versed plus fentanyl were utilized. Continuous ECG, pulse oximetry and blood pressure was monitored throughout the entire procedure. Conscious sedation was for a total of 30 minutes.  ESTIMATED BLOOD LOSS: Minimal cc  CONTRAST USED:  None  FLUOROSCOPY TIME: 0.8 minutes  INDICATIONS:   Richard Chen is a 64 y.o. y.o. male who presents with a nonfunctioning right arm graft.  He has undergone multiple interventions to try and salvage this access the most recent of which was just 2 days ago.  Given the inability to secure adequate access of the right arm tunnel catheter is being placed to the patient may continue his dialysis treatments.  Risk and benefits were reviewed all questions were answered patient agrees to proceed..  DESCRIPTION: After obtaining full informed written consent, the patient was positioned supine. The left neck and chest wall was prepped and draped in a sterile fashion. Ultrasound was placed in a sterile sleeve. Ultrasound was utilized to identify the left internal jugular vein which is noted to be echolucent and compressible indicating patency. Image is recorded for the permanent record. Under direct ultrasound visualization a micro-needle is inserted into the vein followed by the micro-wire. Micro-sheath was then advanced and a J wire is inserted without difficulty under fluoroscopic guidance. Small counterincision was made at the wire insertion site. Dilators are passed over the wire and the tunneled  dialysis catheter is fed into the central venous system without difficulty.  Under fluoroscopy the catheter tip positioned at the atrial caval junction. The catheter is then approximated to the left chest wall and an exit site selected. 1% lidocaine is infiltrated in soft tissues at this level small incision is made and the tunneling device is then passed from the exit site to the left neck counterincision. Catheter is then connected to the tunneling device and the catheter was pulled subcutaneously. It is then transected and the hub assembly connected without difficulty. Both lumens aspirate and flush easily. After verification of smooth contour with proper tip position under fluoroscopy the catheter is packed with 5000 units of heparin per lumen.  Catheter secured to the skin of the left chest wall with 0 silk. A sterile dressing is applied with a Biopatch.  COMPLICATIONS: None  CONDITION: Good  Hortencia Pilar [facility]. Office:  [phone]   02/10/2020,11:49 AM

## 2020-02-10 NOTE — H&P (Signed)
[facility] History & Physical Update  The patient was interviewed and re-examined.  The patient's previous History and Physical has been reviewed and is unchanged.  There is no change in the plan of care. We plan to proceed with the scheduled procedure.  Hortencia Pilar, MD  02/10/2020, 11:49 AM

## 2020-02-10 NOTE — Progress Notes (Signed)
HD started. 

## 2020-02-10 NOTE — Progress Notes (Signed)
HD ended 

## 2020-02-10 NOTE — Progress Notes (Signed)
POST hd 

## 2020-02-11 LAB — BASIC METABOLIC PANEL
Anion gap: 10 (ref 5–15)
BUN: 66 mg/dL — ABNORMAL HIGH (ref 8–23)
CO2: 28 mmol/L (ref 22–32)
Calcium: 8.4 mg/dL — ABNORMAL LOW (ref 8.9–10.3)
Chloride: 98 mmol/L (ref 98–111)
Creatinine, Ser: 8.47 mg/dL — ABNORMAL HIGH (ref 0.61–1.24)
GFR calc Af Amer: 7 mL/min — ABNORMAL LOW (ref >60)
GFR calc non Af Amer: 6 mL/min — ABNORMAL LOW (ref >60)
Glucose, Bld: 175 mg/dL — ABNORMAL HIGH (ref 70–99)
Potassium: 5.1 mmol/L (ref 3.5–5.1)
Sodium: 136 mmol/L (ref 135–145)

## 2020-02-11 LAB — GLUCOSE, CAPILLARY
Glucose-Capillary: 157 mg/dL — ABNORMAL HIGH (ref 70–99)
Glucose-Capillary: 160 mg/dL — ABNORMAL HIGH (ref 70–99)
Glucose-Capillary: 145 mg/dL — ABNORMAL HIGH (ref 70–99)
Glucose-Capillary: 165 mg/dL — ABNORMAL HIGH (ref 70–99)

## 2020-02-11 MED ORDER — METOPROLOL SUCCINATE ER 25 MG PO TB24
12.5000 mg | ORAL_TABLET | Freq: Every day | ORAL | Status: DC
  Administered 2020-02-12: 15:00:00 12.5 mg via ORAL
  Filled 2020-02-11: qty 1

## 2020-02-11 NOTE — Plan of Care (Signed)
  Problem: Clinical Measurements: Goal: Ability to maintain clinical measurements within normal limits will improve Outcome: Progressing Goal: Will remain free from infection Outcome: Progressing Goal: Diagnostic test results will improve Outcome: Progressing Goal: Respiratory complications will improve Outcome: Progressing   Problem: Activity: Goal: Risk for activity intolerance will decrease Outcome: Progressing   Problem: Coping: Goal: Level of anxiety will decrease Outcome: Progressing   Problem: Pain Managment: Goal: General experience of comfort will improve Outcome: Progressing   Problem: Education: Goal: Knowledge of disease and its progression will improve Outcome: Progressing

## 2020-02-11 NOTE — Progress Notes (Signed)
[facility]  ROUNDING NOTE   Subjective:  Patient seen and evaluated bedside. Having periodic bradycardia. Does not appear to be symptomatic from this however. Blood pressure under reasonable control at 146/65.    Objective:  Vital signs in last 24 hours:  Temp:  [98.4 F (36.9 C)-98.7 F (37.1 C)] 98.7 F (37.1 C) (04/22 0757) Pulse Rate:  [47-62] 48 (04/22 1112) Resp:  [11-19] 18 (04/22 0757) BP: (126-160)/(50-72) 146/65 (04/22 1112) SpO2:  [93 %-100 %] 100 % (04/22 0757) Weight:  [117.2 kg] 117.2 kg (04/22 0500)  Weight change: 1.5 kg Filed Weights   02/10/20 0944 02/10/20 1255 02/11/20 0500  Weight: 118.4 kg 118.4 kg 117.2 kg    Intake/Output: I/O last 3 completed shifts: In: 100 [P.O.:100] Out: -9767 [Urine:350]   Intake/Output this shift:  No intake/output data recorded.  Physical Exam: General: No acute distress  Head: Normocephalic, atraumatic. Moist oral mucosal membranes  Eyes: Anicteric  Neck: Supple, trachea midline  Lungs:  Basilar rales, normal effort  Heart: S1S2 no rubs  Abdomen:  Soft, nontender, bowel sounds present  Extremities: Trace peripheral edema.  Neurologic: Awake, alert, following commands  Skin: No lesions  Access: Clotted right upper extremity AV graft, right temporary femoral catheter, left IJ PermCath.    Basic Metabolic Panel: Recent Labs  Lab 02/07/20 0723 02/07/20 0723 02/08/20 0342 02/08/20 0342 02/09/20 0409 02/10/20 0526 02/11/20 0517  NA 136  --  140  --  138 136 136  K 3.8  --  4.3  --  4.8 4.8 5.1  CL 99  --  100  --  100 99 98  CO2 26  --  29  --  28 28 28   GLUCOSE 302*  --  108*  --  180* 151* 175*  BUN 54*  --  50*  --  50* 74* 66*  CREATININE 9.36*  --  8.78*  --  7.81* 9.62* 8.47*  CALCIUM 8.7*   < > 8.8*   < > 9.0 8.7* 8.4*   < > = values in this interval not displayed.    Liver Function Tests: Recent Labs  Lab 02/07/20 0723  AST 30  ALT 30  ALKPHOS 76  BILITOT 0.8  PROT 7.6   ALBUMIN 3.7   No results for input(s): LIPASE, AMYLASE in the last 168 hours. No results for input(s): AMMONIA in the last 168 hours.  CBC: Recent Labs  Lab 02/07/20 0723  WBC 6.6  NEUTROABS 3.0  HGB 11.4*  HCT 37.0*  MCV 93.7  PLT 161    Cardiac Enzymes: No results for input(s): CKTOTAL, CKMB, CKMBINDEX, TROPONINI in the last 168 hours.  BNP: Invalid input(s): POCBNP  CBG: Recent Labs  Lab 02/10/20 1201 02/10/20 1820 02/10/20 2112 02/11/20 0758 02/11/20 1127  GLUCAP 172* 263* 279* 157* 160*    Microbiology: Results for orders placed or performed during the hospital encounter of 02/07/20  Culture, blood (Routine x 2)     Status: None (Preliminary result)   Collection Time: 02/07/20  7:23 AM   Specimen: BLOOD  Result Value Ref Range Status   Specimen Description BLOOD BLOOD LEFT FOREARM  Final   Special Requests   Final    BOTTLES DRAWN AEROBIC AND ANAEROBIC Blood Culture adequate volume   Culture   Final    NO GROWTH 4 DAYS Performed at [facility], [address], [place], [place] [address]    Report Status PENDING  Incomplete  Culture, blood (Routine x 2)  Status: None (Preliminary result)   Collection Time: 02/07/20  7:23 AM   Specimen: BLOOD  Result Value Ref Range Status   Specimen Description BLOOD LEFT WRIST  Final   Special Requests   Final    BOTTLES DRAWN AEROBIC AND ANAEROBIC Blood Culture adequate volume   Culture   Final    NO GROWTH 4 DAYS Performed at [facility], [address] [place], [place] [address]    Report Status PENDING  Incomplete  Respiratory Panel by RT PCR (Flu A&B, Covid) - Nasopharyngeal Swab     Status: None   Collection Time: 02/07/20  8:20 AM   Specimen: Nasopharyngeal Swab  Result Value Ref Range Status   SARS Coronavirus 2 by RT PCR NEGATIVE NEGATIVE Final    Comment: (NOTE) SARS-CoV-2 target nucleic acids are NOT DETECTED. The SARS-CoV-2 RNA is generally detectable in upper  respiratoy specimens during the acute phase of infection. The lowest concentration of SARS-CoV-2 viral copies this assay can detect is 131 copies/mL. A negative result does not preclude SARS-Cov-2 infection and should not be used as the sole basis for treatment or other patient management decisions. A negative result may occur with  improper specimen collection/handling, submission of specimen other than nasopharyngeal swab, presence of viral mutation(s) within the areas targeted by this assay, and inadequate number of viral copies (<131 copies/mL). A negative result must be combined with clinical observations, patient history, and epidemiological information. The expected result is Negative. Fact Sheet for Patients:  PinkCheek.be Fact Sheet for Healthcare Providers:  GravelBags.it This test is not yet ap proved or cleared by the [place] FDA and  has been authorized for detection and/or diagnosis of SARS-CoV-2 by FDA under an Emergency Use Authorization (EUA). This EUA will remain  in effect (meaning this test can be used) for the duration of the COVID-19 declaration under Section 564(b)(1) of the Act, 21 U.S.C. section 360bbb-3(b)(1), unless the authorization is terminated or revoked sooner.    Influenza A by PCR NEGATIVE NEGATIVE Final   Influenza B by PCR NEGATIVE NEGATIVE Final    Comment: (NOTE) The Xpert Xpress SARS-CoV-2/FLU/RSV assay is intended as an aid in  the diagnosis of influenza from Nasopharyngeal swab specimens and  should not be used as a sole basis for treatment. Nasal washings and  aspirates are unacceptable for Xpert Xpress SARS-CoV-2/FLU/RSV  testing. Fact Sheet for Patients: PinkCheek.be Fact Sheet for Healthcare Providers: GravelBags.it This test is not yet approved or cleared by the [place] FDA and  has been authorized for  detection and/or diagnosis of SARS-CoV-2 by  FDA under an Emergency Use Authorization (EUA). This EUA will remain  in effect (meaning this test can be used) for the duration of the  Covid-19 declaration under Section 564(b)(1) of the Act, 21  U.S.C. section 360bbb-3(b)(1), unless the authorization is  terminated or revoked. Performed at [facility], [address] [place], [place] [address]   MRSA PCR Screening     Status: None   Collection Time: 02/07/20  3:01 PM   Specimen: Nasal Mucosa; Nasopharyngeal  Result Value Ref Range Status   MRSA by PCR NEGATIVE NEGATIVE Final    Comment:        The GeneXpert MRSA Assay (FDA approved for NASAL specimens only), is one component of a comprehensive MRSA colonization surveillance program. It is not intended to diagnose MRSA infection nor to guide or monitor treatment for MRSA infections. Performed at [facility], [address] [place],  [place] [address]     Coagulation Studies: No results for input(s): LABPROT, INR in the last 72 hours.  Urinalysis: No results for input(s): COLORURINE, LABSPEC, PHURINE, GLUCOSEU, HGBUR, BILIRUBINUR, KETONESUR, PROTEINUR, UROBILINOGEN, NITRITE, LEUKOCYTESUR in the last 72 hours.  Invalid input(s): APPERANCEUR    Imaging: PERIPHERAL VASCULAR CATHETERIZATION  Result Date: 02/10/2020 See op note    Medications:   . sodium chloride     . allopurinol  300 mg Oral Daily  . amLODipine  10 mg Oral Daily  . atorvastatin  20 mg Oral Daily  . Chlorhexidine Gluconate Cloth  6 each Topical Q0600  . citalopram  20 mg Oral Daily  . cloNIDine  0.2 mg Oral BID  . docusate sodium  200 mg Oral Daily  . furosemide  80 mg Oral Daily  . heparin  5,000 Units Subcutaneous Q8H  . hydrALAZINE  50 mg Oral Q8H  . insulin aspart  0-6 Units Subcutaneous TID WC  . insulin aspart  2 Units Subcutaneous TID WC  . lidocaine  1 patch Transdermal Q12H  . loratadine  10 mg Oral Daily  .  losartan  100 mg Oral Daily  . magnesium oxide  400 mg Oral Daily  . metoprolol succinate  25 mg Oral Daily  . multivitamin  1 tablet Oral Daily  . sodium chloride flush  3 mL Intravenous Q12H  . tamsulosin  0.4 mg Oral Daily   sodium chloride, acetaminophen, colchicine, cyclobenzaprine, HYDROmorphone (DILAUDID) injection, HYDROmorphone (DILAUDID) injection, lactulose, lidocaine-prilocaine, ondansetron (ZOFRAN) IV, sodium chloride flush  Assessment/ Plan:  65 y.o. male with past medical history of ESRD on HD MWF, congestive heart failure, diabetes mellitus type 2, gout, hyperlipidemia, hypertension, history of migraine headaches, anemia chronic kidney disease, secondary hyperparathyroidism who was admitted with significant shortness of breath, acute respiratory failure secondary to pulmonary edema.  [facility]/[address]/MWF/118.5  1.  ESRD on HD MWF.  We plan to remove temporary right femoral dialysis catheter today.  It appears that there is a bruit and thrill within his right upper extremity AV graft.  We will likely try to reuse this as an outpatient.  Continue to use PermCath for now.  2.  Acute respiratory failure.  Secondary to pulmonary edema and now resolved.  Patient with good blood pressure control..  3.  Anemia chronic kidney disease.  We plan to resume Epogen as an outpatient.  4.  Secondary hyperparathyroidism.  Continue amlodipine, clonidine, hydralazine, losartan, and metoprolol.  Metoprolol was held this a.m. for bradycardia.  Consider reducing dosage of metoprolol but defer to primary team.  5.  Hypertension.  Blood pressure still high at times.  Therefore we will add clonidine 0.2 mg twice daily to his medication regimen.  LOS: 4 Ellysa Parrack 4/22/20211:58 PM

## 2020-02-11 NOTE — Progress Notes (Signed)
HD

## 2020-02-11 NOTE — Care Management Important Message (Signed)
Important Message  Patient Details  Name: Richard Chen MRN: [id number] Date of Birth: 09-22-1956   Medicare Important Message Given:  Yes     [name] 02/11/2020, 12:50 PM

## 2020-02-11 NOTE — Progress Notes (Signed)
[facility] PROGRESS NOTE    Progress Note  Richard Chen  KDT:[id number] DOB: 10/23/1955 DOA: 02/07/2020 PCP: Venia Carbon, MD     Brief Narrative:   Richard Chen is an 64 y.o. male past medical history significant for end-stage renal disease on Monday Wednesdays and Fridays history of diabetes mellitus, essential hypertension presents to the ED via EMS for evaluation of shortness of breath and unresponsiveness.  Level to the ED he was found in respiratory distress tachypneic and audible rhonchi, with a significant elevated blood pressure and he was started on BiPAP in the emergency room.  Assessment/Plan:   Acute respiratory failure with hypoxia due to acute on chronic diastolic CHF (congestive heart failure) ([place]) hypertensive emergency: Patient upon arrival to the ED was hypoxic he was emergently dialyzed. He was started on hydralazine amlodipine and clonidine.  To stabilize it has now been ranging from 138/62-150 7/64.  End stage renal disease on dialysis (HCC)/with clotted right arm axis: Temporary dialysis catheter was placed on 02/07/2020, who clotted. And dialysis was started, access was declotted on 02/08/2019 and groin catheter removed on 02/10/2020 And central tunnel catheter was inserted on 02/10/2020, functioning. Renal will like to dialyze on 02/12/2020 before being discharged home.   History of anemia due to chronic kidney disease: Likely due to his renal disease further management per renal.   DVT prophylaxis: lovenox Family Communication:none Status is: Inpatient  Remains inpatient appropriate because:Hemodynamically unstable   Dispo: The patient is from: Home              Anticipated d/c is to: Home              Anticipated d/c date is: 2 days              Patient currently is not medically stable to d/c.  Code Status:     Code Status Orders  (From admission, onward)         Start     Ordered   02/07/20 0903  Full code  Continuous     02/07/20 0912        Code Status History    Date Active Date Inactive Code Status Order ID Comments User Context   03/22/2016 2033 03/24/2016 1805 Full Code [id number]  Lance Coon, MD Inpatient   Advance Care Planning Activity        IV Access:    Peripheral IV   Procedures and diagnostic studies:   PERIPHERAL VASCULAR CATHETERIZATION  Result Date: 02/10/2020 See op note    Medical Consultants:    None.  Anti-Infectives:   None  Subjective:    Richard Chen no complains of so he didn't have a good night sleep.  Objective:    Vitals:   02/11/20 0533 02/11/20 0757 02/11/20 0835 02/11/20 1112  BP: (!) 128/50 (!) 148/62  (!) 146/65  Pulse: (!) 47 (!) 47 (!) 58 (!) 48  Resp:  18    Temp:  98.7 F (37.1 C)    TempSrc:  Oral    SpO2:  100%    Weight:      Height:       SpO2: 100 % O2 Flow Rate (L/min): 2 L/min   Intake/Output Summary (Last 24 hours) at 02/11/2020 1241 Last data filed at 02/10/2020 2142 Gross per 24 hour  Intake 100 ml  Output -2000 ml  Net 2100 ml   Filed Weights   02/10/20 0944 02/10/20 1255 02/11/20 0500  Weight: 118.4 kg  118.4 kg 117.2 kg    Exam: General exam: In no acute distress. Respiratory system: Good air movement and clear to auscultation. Cardiovascular system: S1 & S2 heard, RRR. No JVD. Gastrointestinal system: Abdomen is nondistended, soft and nontender.  Extremities: No pedal edema. Skin: No rashes, lesions or ulcers  Data Reviewed:    Labs: Basic Metabolic Panel: Recent Labs  Lab 02/07/20 0723 02/07/20 0723 02/08/20 0342 02/08/20 0342 02/09/20 0409 02/09/20 0409 02/10/20 0526 02/11/20 0517  NA 136  --  140  --  138  --  136 136  K 3.8   < > 4.3   < > 4.8   < > 4.8 5.1  CL 99  --  100  --  100  --  99 98  CO2 26  --  29  --  28  --  28 28  GLUCOSE 302*  --  108*  --  180*  --  151* 175*  BUN 54*  --  50*  --  50*  --  74* 66*  CREATININE 9.36*  --  8.78*  --  7.81*  --  9.62* 8.47*  CALCIUM  8.7*  --  8.8*  --  9.0  --  8.7* 8.4*   < > = values in this interval not displayed.   GFR Estimated Creatinine Clearance: 11.5 mL/min (A) (by C-G formula based on SCr of 8.47 mg/dL (H)). Liver Function Tests: Recent Labs  Lab 02/07/20 0723  AST 30  ALT 30  ALKPHOS 76  BILITOT 0.8  PROT 7.6  ALBUMIN 3.7   No results for input(s): LIPASE, AMYLASE in the last 168 hours. No results for input(s): AMMONIA in the last 168 hours. Coagulation profile Recent Labs  Lab 02/07/20 0723  INR 1.0   COVID-19 Labs  No results for input(s): DDIMER, FERRITIN, LDH, CRP in the last 72 hours.  Lab Results  Component Value Date   SARSCOV2NAA NEGATIVE 02/07/2020   [place] NEGATIVE 07/10/2019   [place] NEGATIVE 06/26/2019    CBC: Recent Labs  Lab 02/07/20 0723  WBC 6.6  NEUTROABS 3.0  HGB 11.4*  HCT 37.0*  MCV 93.7  PLT 161   Cardiac Enzymes: No results for input(s): CKTOTAL, CKMB, CKMBINDEX, TROPONINI in the last 168 hours. BNP (last 3 results) No results for input(s): PROBNP in the last 8760 hours. CBG: Recent Labs  Lab 02/10/20 1201 02/10/20 1820 02/10/20 2112 02/11/20 0758 02/11/20 1127  GLUCAP 172* 263* 279* 157* 160*   D-Dimer: No results for input(s): DDIMER in the last 72 hours. Hgb A1c: No results for input(s): HGBA1C in the last 72 hours. Lipid Profile: No results for input(s): CHOL, HDL, LDLCALC, TRIG, CHOLHDL, LDLDIRECT in the last 72 hours. Thyroid function studies: No results for input(s): TSH, T4TOTAL, T3FREE, THYROIDAB in the last 72 hours.  Invalid input(s): FREET3 Anemia work up: No results for input(s): VITAMINB12, FOLATE, FERRITIN, TIBC, IRON, RETICCTPCT in the last 72 hours. Sepsis Labs: Recent Labs  Lab 02/07/20 0723 02/07/20 0955  WBC 6.6  --   LATICACIDVEN 2.5* 1.8   Microbiology Recent Results (from the past 240 hour(s))  Culture, blood (Routine x 2)     Status: None (Preliminary result)   Collection Time: 02/07/20  7:23 AM    Specimen: BLOOD  Result Value Ref Range Status   Specimen Description BLOOD BLOOD LEFT FOREARM  Final   Special Requests   Final    BOTTLES DRAWN AEROBIC AND ANAEROBIC Blood Culture adequate volume   Culture  Final    NO GROWTH 4 DAYS Performed at [facility], [address] [place], [place] [address]    Report Status PENDING  Incomplete  Culture, blood (Routine x 2)     Status: None (Preliminary result)   Collection Time: 02/07/20  7:23 AM   Specimen: BLOOD  Result Value Ref Range Status   Specimen Description BLOOD LEFT WRIST  Final   Special Requests   Final    BOTTLES DRAWN AEROBIC AND ANAEROBIC Blood Culture adequate volume   Culture   Final    NO GROWTH 4 DAYS Performed at [facility], [address] [place], [place] [address]    Report Status PENDING  Incomplete  Respiratory Panel by RT PCR (Flu A&B, Covid) - Nasopharyngeal Swab     Status: None   Collection Time: 02/07/20  8:20 AM   Specimen: Nasopharyngeal Swab  Result Value Ref Range Status   SARS Coronavirus 2 by RT PCR NEGATIVE NEGATIVE Final    Comment: (NOTE) SARS-CoV-2 target nucleic acids are NOT DETECTED. The SARS-CoV-2 RNA is generally detectable in upper respiratoy specimens during the acute phase of infection. The lowest concentration of SARS-CoV-2 viral copies this assay can detect is 131 copies/mL. A negative result does not preclude SARS-Cov-2 infection and should not be used as the sole basis for treatment or other patient management decisions. A negative result may occur with  improper specimen collection/handling, submission of specimen other than nasopharyngeal swab, presence of viral mutation(s) within the areas targeted by this assay, and inadequate number of viral copies (<131 copies/mL). A negative result must be combined with clinical observations, patient history, and epidemiological information. The expected result is Negative. Fact Sheet for Patients:   PinkCheek.be Fact Sheet for Healthcare Providers:  GravelBags.it This test is not yet ap proved or cleared by the [place] FDA and  has been authorized for detection and/or diagnosis of SARS-CoV-2 by FDA under an Emergency Use Authorization (EUA). This EUA will remain  in effect (meaning this test can be used) for the duration of the COVID-19 declaration under Section 564(b)(1) of the Act, 21 U.S.C. section 360bbb-3(b)(1), unless the authorization is terminated or revoked sooner.    Influenza A by PCR NEGATIVE NEGATIVE Final   Influenza B by PCR NEGATIVE NEGATIVE Final    Comment: (NOTE) The Xpert Xpress SARS-CoV-2/FLU/RSV assay is intended as an aid in  the diagnosis of influenza from Nasopharyngeal swab specimens and  should not be used as a sole basis for treatment. Nasal washings and  aspirates are unacceptable for Xpert Xpress SARS-CoV-2/FLU/RSV  testing. Fact Sheet for Patients: PinkCheek.be Fact Sheet for Healthcare Providers: GravelBags.it This test is not yet approved or cleared by the [place] FDA and  has been authorized for detection and/or diagnosis of SARS-CoV-2 by  FDA under an Emergency Use Authorization (EUA). This EUA will remain  in effect (meaning this test can be used) for the duration of the  Covid-19 declaration under Section 564(b)(1) of the Act, 21  U.S.C. section 360bbb-3(b)(1), unless the authorization is  terminated or revoked. Performed at [facility], [address] [place], [place] [address]   MRSA PCR Screening     Status: None   Collection Time: 02/07/20  3:01 PM   Specimen: Nasal Mucosa; Nasopharyngeal  Result Value Ref Range Status   MRSA by PCR NEGATIVE NEGATIVE Final    Comment:        The GeneXpert MRSA Assay (FDA approved for NASAL specimens only),  is one component of a comprehensive MRSA colonization  surveillance program. It is not intended to diagnose MRSA infection nor to guide or monitor treatment for MRSA infections. Performed at [facility], [address] [place], [place] [address]      Medications:   . allopurinol  300 mg Oral Daily  . amLODipine  10 mg Oral Daily  . atorvastatin  20 mg Oral Daily  . Chlorhexidine Gluconate Cloth  6 each Topical Q0600  . citalopram  20 mg Oral Daily  . cloNIDine  0.2 mg Oral BID  . docusate sodium  200 mg Oral Daily  . furosemide  80 mg Oral Daily  . heparin  5,000 Units Subcutaneous Q8H  . hydrALAZINE  50 mg Oral Q8H  . insulin aspart  0-6 Units Subcutaneous TID WC  . insulin aspart  2 Units Subcutaneous TID WC  . lidocaine  1 patch Transdermal Q12H  . loratadine  10 mg Oral Daily  . losartan  100 mg Oral Daily  . magnesium oxide  400 mg Oral Daily  . metoprolol succinate  25 mg Oral Daily  . multivitamin  1 tablet Oral Daily  . sodium chloride flush  3 mL Intravenous Q12H  . tamsulosin  0.4 mg Oral Daily   Continuous Infusions: . sodium chloride        LOS: 4 days   Charlynne Cousins  [facility]  02/11/2020, 12:41 PM

## 2020-02-12 DIAGNOSIS — J9601 Acute respiratory failure with hypoxia: Secondary | ICD-10-CM

## 2020-02-12 LAB — BASIC METABOLIC PANEL
Anion gap: 13 (ref 5–15)
BUN: 86 mg/dL — ABNORMAL HIGH (ref 8–23)
CO2: 27 mmol/L (ref 22–32)
Calcium: 8.4 mg/dL — ABNORMAL LOW (ref 8.9–10.3)
Chloride: 96 mmol/L — ABNORMAL LOW (ref 98–111)
Creatinine, Ser: 10.07 mg/dL — ABNORMAL HIGH (ref 0.61–1.24)
GFR calc Af Amer: 6 mL/min — ABNORMAL LOW (ref >60)
GFR calc non Af Amer: 5 mL/min — ABNORMAL LOW (ref >60)
Glucose, Bld: 165 mg/dL — ABNORMAL HIGH (ref 70–99)
Potassium: 4.7 mmol/L (ref 3.5–5.1)
Sodium: 136 mmol/L (ref 135–145)

## 2020-02-12 LAB — CULTURE, BLOOD (ROUTINE X 2)
Culture: NO GROWTH
Culture: NO GROWTH
Special Requests: ADEQUATE
Special Requests: ADEQUATE

## 2020-02-12 LAB — GLUCOSE, CAPILLARY
Glucose-Capillary: 139 mg/dL — ABNORMAL HIGH (ref 70–99)
Glucose-Capillary: 91 mg/dL (ref 70–99)

## 2020-02-12 MED ORDER — METOPROLOL SUCCINATE ER 25 MG PO TB24: 12.5000 mg | ORAL_TABLET | Freq: Every day | ORAL | 3 refills | Status: DC

## 2020-02-12 NOTE — Plan of Care (Signed)
  Problem: Clinical Measurements: Goal: Ability to maintain clinical measurements within normal limits will improve Outcome: Progressing Goal: Will remain free from infection Outcome: Progressing Goal: Diagnostic test results will improve Outcome: Progressing Goal: Respiratory complications will improve Outcome: Progressing Goal: Cardiovascular complication will be avoided Outcome: Progressing   Problem: Activity: Goal: Risk for activity intolerance will decrease Outcome: Progressing   Problem: Pain Managment: Goal: General experience of comfort will improve Outcome: Progressing   Problem: Safety: Goal: Ability to remain free from injury will improve Outcome: Progressing   

## 2020-02-12 NOTE — Progress Notes (Signed)
[facility]  ROUNDING NOTE   Subjective:  Patient completed dialysis treatment today. Tolerated well. Blood pressure currently 154/65. Still having what appears to be symptomatic bradycardia.    Objective:  Vital signs in last 24 hours:  Temp:  [97.8 F (36.6 C)-98.9 F (37.2 C)] 98.9 F (37.2 C) (04/23 0927) Pulse Rate:  [41-63] (P) 53 (04/23 1320) Resp:  [15-18] 15 (04/23 0927) BP: (104-157)/(62-89) (P) 160/73 (04/23 1320) SpO2:  [99 %-100 %] (P) 100 % (04/23 1320) Weight:  [115.7 kg] 115.7 kg (04/23 0927)  Weight change: -2.687 kg Filed Weights   02/11/20 0500 02/12/20 0649 02/12/20 0927  Weight: 117.2 kg 115.7 kg 115.7 kg    Intake/Output: I/O last 3 completed shifts: In: 460 [P.O.:460] Out: -    Intake/Output this shift:  No intake/output data recorded.  Physical Exam: General: No acute distress  Head: Normocephalic, atraumatic. Moist oral mucosal membranes  Eyes: Anicteric  Neck: Supple, trachea midline  Lungs:  Clear bilateral, normal effort  Heart: S1S2 no rubs  Abdomen:  Soft, nontender, bowel sounds present  Extremities: Trace peripheral edema.  Neurologic: Awake, alert, following commands  Skin: No lesions  Access: Good bruit RUE AVG, L IJ permcath    Basic Metabolic Panel: Recent Labs  Lab 02/08/20 0342 02/08/20 0342 02/09/20 0409 02/09/20 0409 02/10/20 0526 02/11/20 0517 02/12/20 0336  NA 140  --  138  --  136 136 136  K 4.3  --  4.8  --  4.8 5.1 4.7  CL 100  --  100  --  99 98 96*  CO2 29  --  28  --  28 28 27   GLUCOSE 108*  --  180*  --  151* 175* 165*  BUN 50*  --  50*  --  74* 66* 86*  CREATININE 8.78*  --  7.81*  --  9.62* 8.47* 10.07*  CALCIUM 8.8*   < > 9.0   < > 8.7* 8.4* 8.4*   < > = values in this interval not displayed.    Liver Function Tests: Recent Labs  Lab 02/07/20 0723  AST 30  ALT 30  ALKPHOS 76  BILITOT 0.8  PROT 7.6  ALBUMIN 3.7   No results for input(s): LIPASE, AMYLASE in the last 168  hours. No results for input(s): AMMONIA in the last 168 hours.  CBC: Recent Labs  Lab 02/07/20 0723  WBC 6.6  NEUTROABS 3.0  HGB 11.4*  HCT 37.0*  MCV 93.7  PLT 161    Cardiac Enzymes: No results for input(s): CKTOTAL, CKMB, CKMBINDEX, TROPONINI in the last 168 hours.  BNP: Invalid input(s): POCBNP  CBG: Recent Labs  Lab 02/11/20 0758 02/11/20 1127 02/11/20 1650 02/11/20 2127 02/12/20 0748  GLUCAP 157* 160* 145* 165* 139*    Microbiology: Results for orders placed or performed during the hospital encounter of 02/07/20  Culture, blood (Routine x 2)     Status: None   Collection Time: 02/07/20  7:23 AM   Specimen: BLOOD  Result Value Ref Range Status   Specimen Description BLOOD BLOOD LEFT FOREARM  Final   Special Requests   Final    BOTTLES DRAWN AEROBIC AND ANAEROBIC Blood Culture adequate volume   Culture   Final    NO GROWTH 5 DAYS Performed at [facility], [address] [place], [place] [address]    Report Status 02/12/2020 FINAL  Final  Culture, blood (Routine x 2)     Status: None   Collection  Time: 02/07/20  7:23 AM   Specimen: BLOOD  Result Value Ref Range Status   Specimen Description BLOOD LEFT WRIST  Final   Special Requests   Final    BOTTLES DRAWN AEROBIC AND ANAEROBIC Blood Culture adequate volume   Culture   Final    NO GROWTH 5 DAYS Performed at [facility], [address] [place], [place] [address]    Report Status 02/12/2020 FINAL  Final  Respiratory Panel by RT PCR (Flu A&B, Covid) - Nasopharyngeal Swab     Status: None   Collection Time: 02/07/20  8:20 AM   Specimen: Nasopharyngeal Swab  Result Value Ref Range Status   SARS Coronavirus 2 by RT PCR NEGATIVE NEGATIVE Final    Comment: (NOTE) SARS-CoV-2 target nucleic acids are NOT DETECTED. The SARS-CoV-2 RNA is generally detectable in upper respiratoy specimens during the acute phase of infection. The lowest concentration of SARS-CoV-2 viral copies this  assay can detect is 131 copies/mL. A negative result does not preclude SARS-Cov-2 infection and should not be used as the sole basis for treatment or other patient management decisions. A negative result may occur with  improper specimen collection/handling, submission of specimen other than nasopharyngeal swab, presence of viral mutation(s) within the areas targeted by this assay, and inadequate number of viral copies (<131 copies/mL). A negative result must be combined with clinical observations, patient history, and epidemiological information. The expected result is Negative. Fact Sheet for Patients:  PinkCheek.be Fact Sheet for Healthcare Providers:  GravelBags.it This test is not yet ap proved or cleared by the [place] FDA and  has been authorized for detection and/or diagnosis of SARS-CoV-2 by FDA under an Emergency Use Authorization (EUA). This EUA will remain  in effect (meaning this test can be used) for the duration of the COVID-19 declaration under Section 564(b)(1) of the Act, 21 U.S.C. section 360bbb-3(b)(1), unless the authorization is terminated or revoked sooner.    Influenza A by PCR NEGATIVE NEGATIVE Final   Influenza B by PCR NEGATIVE NEGATIVE Final    Comment: (NOTE) The Xpert Xpress SARS-CoV-2/FLU/RSV assay is intended as an aid in  the diagnosis of influenza from Nasopharyngeal swab specimens and  should not be used as a sole basis for treatment. Nasal washings and  aspirates are unacceptable for Xpert Xpress SARS-CoV-2/FLU/RSV  testing. Fact Sheet for Patients: PinkCheek.be Fact Sheet for Healthcare Providers: GravelBags.it This test is not yet approved or cleared by the [place] FDA and  has been authorized for detection and/or diagnosis of SARS-CoV-2 by  FDA under an Emergency Use Authorization (EUA). This EUA will remain  in  effect (meaning this test can be used) for the duration of the  Covid-19 declaration under Section 564(b)(1) of the Act, 21  U.S.C. section 360bbb-3(b)(1), unless the authorization is  terminated or revoked. Performed at [facility], [address] [place], [place] [address]   MRSA PCR Screening     Status: None   Collection Time: 02/07/20  3:01 PM   Specimen: Nasal Mucosa; Nasopharyngeal  Result Value Ref Range Status   MRSA by PCR NEGATIVE NEGATIVE Final    Comment:        The GeneXpert MRSA Assay (FDA approved for NASAL specimens only), is one component of a comprehensive MRSA colonization surveillance program. It is not intended to diagnose MRSA infection nor to guide or monitor treatment for MRSA infections. Performed at [facility], [address] [place], [place] [address]  Coagulation Studies: No results for input(s): LABPROT, INR in the last 72 hours.  Urinalysis: No results for input(s): COLORURINE, LABSPEC, PHURINE, GLUCOSEU, HGBUR, BILIRUBINUR, KETONESUR, PROTEINUR, UROBILINOGEN, NITRITE, LEUKOCYTESUR in the last 72 hours.  Invalid input(s): APPERANCEUR    Imaging: No results found.   Medications:   . sodium chloride     . allopurinol  300 mg Oral Daily  . amLODipine  10 mg Oral Daily  . atorvastatin  20 mg Oral Daily  . Chlorhexidine Gluconate Cloth  6 each Topical Q0600  . citalopram  20 mg Oral Daily  . cloNIDine  0.2 mg Oral BID  . docusate sodium  200 mg Oral Daily  . furosemide  80 mg Oral Daily  . heparin  5,000 Units Subcutaneous Q8H  . hydrALAZINE  50 mg Oral Q8H  . insulin aspart  0-6 Units Subcutaneous TID WC  . insulin aspart  2 Units Subcutaneous TID WC  . lidocaine  1 patch Transdermal Q12H  . loratadine  10 mg Oral Daily  . losartan  100 mg Oral Daily  . magnesium oxide  400 mg Oral Daily  . metoprolol succinate  12.5 mg Oral Daily  . multivitamin  1 tablet Oral Daily  . sodium chloride flush  3 mL  Intravenous Q12H  . tamsulosin  0.4 mg Oral Daily   sodium chloride, acetaminophen, colchicine, cyclobenzaprine, HYDROmorphone (DILAUDID) injection, HYDROmorphone (DILAUDID) injection, lactulose, lidocaine-prilocaine, ondansetron (ZOFRAN) IV, sodium chloride flush  Assessment/ Plan:  64 y.o. male with past medical history of ESRD on HD MWF, congestive heart failure, diabetes mellitus type 2, gout, hyperlipidemia, hypertension, history of migraine headaches, anemia chronic kidney disease, secondary hyperparathyroidism who was admitted with significant shortness of breath, acute respiratory failure secondary to pulmonary edema.  [facility]/[address]/MWF/118.5  1.  ESRD on HD MWF.  Patient completed dialysis treatment today.  Tolerated well.  Next dialysis treatment on Monday as an outpatient.  2.  Acute respiratory failure.  Primary issue over the course of this hospitalization which was secondary to pulmonary edema and malignant hypertension which in turn was secondary to dietary indiscretion.  Patient counseled on this.  Overall doing much better.  3.  Anemia chronic kidney disease.  We plan to resume Epogen as an outpatient.  4.  Secondary hyperparathyroidism.  Continue to monitor abdominal metabolism parameters as an outpatient.  5.  Hypertension.  Patient taken off of metoprolol given bradycardia.  Otherwise maintain the patient on amlodipine, clonidine, hydralazine, and losartan.   LOS: 5 Jacquel Redditt 4/23/20212:15 PM

## 2020-02-12 NOTE — Progress Notes (Signed)
Received MD order to discharge patient to home, reviewed home meds, discharge instructions, prescriptions and follow up appointments with patient and patient verbalized understanding    

## 2020-02-12 NOTE — Discharge Summary (Signed)
Physician Discharge Summary  Richard Chen DJS:[id number] DOB: 1956-07-01 DOA: 02/07/2020  PCP: Venia Carbon, MD  Admit date: 02/07/2020 Discharge date: 02/12/2020  Admitted From: Home Disposition:  Home  Recommendations for Outpatient Follow-up:  1. Follow up with PCP in 1-2 weeks 2. Please obtain BMP/CBC in one week   Home Health:No Equipment/Devices:none  Discharge Condition:Stable CODE STATUS:Full Diet recommendation: Heart Healthy   Brief/Interim Summary: 64 y.o. male past medical history significant for end-stage renal disease on Monday Wednesdays and Fridays history of diabetes mellitus, essential hypertension presents to the ED via EMS for evaluation of shortness of breath and unresponsiveness.  Level to the ED he was found in respiratory distress tachypneic and audible rhonchi, with a significant elevated blood pressure and he was started on BiPAP in the emergency room.  Discharge Diagnoses:  Principal Problem:   Acute on chronic diastolic CHF (congestive heart failure) (HCC) Active Problems:   Acute respiratory failure with hypoxia (HCC)   End stage renal disease on dialysis (HCC)   CHF (congestive heart failure), NYHA class II, acute on chronic, diastolic (HCC)   History of anemia due to chronic kidney disease   Hypertensive emergency Acute respiratory failure with hypoxia due to acute on chronic diastolic heart failure in the setting of hypertensive emergency: Upon arrival in the ED he was emergently dialyzed, he was started on IV hydralazine, amlodipine and clonidine. Renal was consulted and he was dialyzed His oxygen requirements resolved clonidine, Lasix, losartan and metoprolol.  End-stage renal disease on dialysis with clotted right arm axis: Temporary catheter was placed on 02/07/2020 declotted, was declotted on 02/08/2020. At [name] in his groin area which was discontinued on 02/11/2020. He was continuing regular dialysis with good working dialysis  catheter.  History of anemia due to chronic renal disease: Further management per renal.  Secondary hyperparathyroidism: Further management per renal continue current regimen as an outpatient.   Discharge Instructions  Discharge Instructions    Diet - low sodium heart healthy   Complete by: As directed    Take a low sodium, renal diet   Diet - low sodium heart healthy   Complete by: As directed    Increase activity slowly   Complete by: As directed    Increase activity slowly   Complete by: As directed      Allergies as of 02/12/2020      Reactions   Aspirin Anaphylaxis   Shellfish Allergy Anaphylaxis   Other Hives   Dye when viewed kidney (break out in knots)   Contrast Media [iodinated Diagnostic Agents] Rash, Hives      Medication List    TAKE these medications   Accu-Chek FastClix Lancets Misc 1 Units by Does not apply route daily.   Accu-Chek SmartView Control Liqd 1 Bottle by In Vitro route as needed.   Accu-Chek SmartView test strip Generic drug: glucose blood Use to check blood sugar once a day. Dx Code E11.29   acetaminophen 500 MG tablet Commonly known as: TYLENOL Take 500 mg by mouth every 6 (six) hours as needed (pain).   Alcohol Prep Pads 1 Units by Does not apply route daily.   allopurinol 300 MG tablet Commonly known as: ZYLOPRIM TAKE 1 TABLET EVERY DAY   amLODipine 10 MG tablet Commonly known as: NORVASC Take 1 tablet (10 mg total) by mouth daily.   atorvastatin 20 MG tablet Commonly known as: LIPITOR TAKE 1 TABLET EVERY DAY   citalopram 20 MG tablet Commonly known as: CELEXA TAKE 1 TABLET  EVERY DAY   cloNIDine 0.2 MG tablet Commonly known as: CATAPRES Take 1 tablet (0.2 mg total) by mouth 2 (two) times daily.   colchicine 0.6 MG tablet Take 0.6 mg 3 (three) times daily as needed by mouth (gout flare).   docusate sodium 100 MG capsule Commonly known as: COLACE Take 200 mg daily by mouth.   fexofenadine 180 MG  tablet Commonly known as: ALLEGRA Take 180 mg daily by mouth.   fluticasone 50 MCG/ACT nasal spray Commonly known as: FLONASE USE 2 SPRAYS IN EACH NOSTRIL EVERY DAY   furosemide 80 MG tablet Commonly known as: LASIX Take 80 mg daily by mouth.   hydrALAZINE 50 MG tablet Commonly known as: APRESOLINE Take 1 tablet (50 mg total) by mouth every 8 (eight) hours. What changed:   medication strength  how much to take  when to take this   lactulose 10 GM/15ML solution Commonly known as: CHRONULAC Take 15 mLs daily as needed by mouth for mild constipation.   lidocaine 5 % Commonly known as: Lidoderm Place 1 patch onto the skin every 12 (twelve) hours. Remove & Discard patch within 12 hours or as directed by MD   lidocaine-prilocaine cream Commonly known as: EMLA Apply 1 application topically daily as needed (prior to port access for dialysis).   losartan 100 MG tablet Commonly known as: COZAAR Take 100 mg by mouth daily.   magnesium oxide 400 MG tablet Commonly known as: MAG-OX Take 400 mg by mouth daily.   metoprolol succinate 25 MG 24 hr tablet Commonly known as: TOPROL-XL Take 0.5 tablets (12.5 mg total) by mouth daily. What changed: how much to take   Rena-Vite Rx 1 MG Tabs Take 1 tablet by mouth daily.   tamsulosin 0.4 MG Caps capsule Commonly known as: FLOMAX Take 1 capsule (0.4 mg total) by mouth daily.      Follow-up Information    Venia Carbon, MD Follow up.   Specialties: Internal Medicine, Pediatrics Why: Please schedule PCP follow-up within 5-7 days of discharge date. Contact information: [place] [address] [phone]        Venia Carbon, MD.   Specialties: Internal Medicine, Pediatrics Why: APPOINTMENT DR [address] 26 AT 10:30AM Contact information: [place] [address] [phone]        [name], NP Follow up in 1 month(s).   Specialty: Vascular  Surgery Why: Patient will need HDA with visit.  Contact information: [place] [address] [phone]          Allergies  Allergen Reactions  . Aspirin Anaphylaxis  . Shellfish Allergy Anaphylaxis  . Other Hives    Dye when viewed kidney (break out in knots)  . Contrast Media [Iodinated Diagnostic Agents] Rash and Hives    Consultations:  Nephrology   Procedures/Studies: PERIPHERAL VASCULAR CATHETERIZATION  Result Date: 02/10/2020 See op note  PERIPHERAL VASCULAR CATHETERIZATION  Result Date: 02/08/2020 See op note  DG Chest Portable 1 View  Result Date: 02/07/2020 CLINICAL DATA:  Shortness of breath. EXAM: PORTABLE CHEST 1 VIEW COMPARISON:  June 27, 2019. FINDINGS: Stable cardiomegaly. No pneumothorax or pleural effusion is noted. Mild central pulmonary vascular congestion is noted. Bibasilar opacities are noted concerning for edema or possibly atelectasis. Bony thorax is unremarkable. IMPRESSION: Stable cardiomegaly with mild central pulmonary vascular congestion. Bibasilar opacities are noted concerning for edema or possibly atelectasis. Electronically Signed   By: Marijo Conception  Chen.D.   On: 02/07/2020 08:18   ECHOCARDIOGRAM COMPLETE  Result Date: 02/09/2020    ECHOCARDIOGRAM REPORT   Patient Name:   Richard Chen Date of Exam: 02/08/2020 Medical Rec #:  [id number]       Height:       73.0 in Accession #:    [id number]      Weight:       274.3 lb Date of Birth:  [birth year]       BSA:          2.461 Chen Patient Age:    64 years        BP:           179/76 mmHg Patient Gender: Chen               HR:           68 bpm. Exam Location:  [facility] Procedure: 2D Echo, Cardiac Doppler and Color Doppler Indications:     CHF 428.31  History:         Patient has no prior history of Echocardiogram examinations.                  CHF; Risk Factors:Hypertension.  Sonographer:     Alyse Low Roar Referring Phys:  [place] Diagnosing Phys: Serafina Royals MD IMPRESSIONS  1.  Left ventricular ejection fraction, by estimation, is 35 to 40%. The left ventricle has moderately decreased function. The left ventricle demonstrates regional wall motion abnormalities (see scoring diagram/findings for description). Left ventricular  diastolic parameters were normal.  2. Right ventricular systolic function is normal. The right ventricular size is normal. There is mildly elevated pulmonary artery systolic pressure.  3. The mitral valve is normal in structure. Mild mitral valve regurgitation.  4. The aortic valve is normal in structure. Aortic valve regurgitation is not visualized. FINDINGS  Left Ventricle: Left ventricular ejection fraction, by estimation, is 35 to 40%. The left ventricle has moderately decreased function. The left ventricle demonstrates regional wall motion abnormalities. Moderate hypokinesis of the left ventricular, mid-apical lateral wall and inferior wall. The left ventricular internal cavity size was normal in size. There is no left ventricular hypertrophy. Left ventricular diastolic parameters were normal. Right Ventricle: The right ventricular size is normal. No increase in right ventricular wall thickness. Right ventricular systolic function is normal. There is mildly elevated pulmonary artery systolic pressure. The tricuspid regurgitant velocity is 2.66  Chen/s, and with an assumed right atrial pressure of 10 mmHg, the estimated right ventricular systolic pressure is 44.3 mmHg. Left Atrium: Left atrial size was normal in size. Right Atrium: Right atrial size was normal in size. Pericardium: There is no evidence of pericardial effusion. Mitral Valve: The mitral valve is normal in structure. Mild mitral valve regurgitation. Tricuspid Valve: The tricuspid valve is normal in structure. Tricuspid valve regurgitation is mild. Aortic Valve: The aortic valve is normal in structure. Aortic valve regurgitation is not visualized. Aortic valve mean gradient measures 6.0 mmHg. Aortic valve  peak gradient measures 11.8 mmHg. Aortic valve area, by VTI measures 2.30 cm. Pulmonic Valve: The pulmonic valve was normal in structure. Pulmonic valve regurgitation is trivial. Aorta: The aortic root and ascending aorta are structurally normal, with no evidence of dilitation. IAS/Shunts: No atrial level shunt detected by color flow Doppler.  LEFT VENTRICLE PLAX 2D LVIDd:         5.71 cm      Diastology LVIDs:  4.38 cm      LV e' lateral:   5.98 cm/s LV PW:         1.23 cm      LV E/e' lateral: 10.3 LV IVS:        1.28 cm      LV e' medial:    4.13 cm/s LVOT diam:     2.30 cm      LV E/e' medial:  15.0 LV SV:         77 LV SV Index:   31 LVOT Area:     4.15 cm  LV Volumes (MOD) LV vol d, MOD A2C: 157.0 ml LV vol d, MOD A4C: 188.0 ml LV vol s, MOD A2C: 85.8 ml LV vol s, MOD A4C: 107.0 ml LV SV MOD A2C:     71.2 ml LV SV MOD A4C:     188.0 ml LV SV MOD BP:      79.9 ml RIGHT VENTRICLE RV Mid diam:    3.32 cm RV S prime:     25.10 cm/s LEFT ATRIUM              Index       RIGHT ATRIUM           Index LA diam:        5.10 cm  2.07 cm/Chen  RA Area:     20.50 cm LA Vol (A2C):   108.0 ml 43.89 ml/Chen RA Volume:   61.20 ml  24.87 ml/Chen LA Vol (A4C):   109.0 ml 44.30 ml/Chen LA Biplane Vol: 114.0 ml 46.33 ml/Chen  AORTIC VALVE                    PULMONIC VALVE AV Area (Vmax):    2.30 cm     PV Vmax:        1.53 Chen/s AV Area (Vmean):   2.21 cm     PV Peak grad:   9.4 mmHg AV Area (VTI):     2.30 cm     RVOT Peak grad: 8 mmHg AV Vmax:           172.00 cm/s AV Vmean:          114.500 cm/s AV VTI:            0.336 Chen AV Peak Grad:      11.8 mmHg AV Mean Grad:      6.0 mmHg LVOT Vmax:         95.10 cm/s LVOT Vmean:        60.900 cm/s LVOT VTI:          0.186 Chen LVOT/AV VTI ratio: 0.55  AORTA Ao Root diam: 3.20 cm MITRAL VALVE               TRICUSPID VALVE MV Area (PHT): 3.16 cm    TR Peak grad:   28.3 mmHg MV Decel Time: 240 msec    TR Vmax:        266.00 cm/s MV E velocity: 61.80 cm/s MV A velocity: 98.00 cm/s  SHUNTS MV  E/A ratio:  0.63        Systemic VTI:  0.19 Chen MV A Prime:    10.4 cm/s   Systemic Diam: 2.30 cm Serafina Royals MD Electronically signed by Serafina Royals MD Signature Date/Time: 02/09/2020/2:03:54 PM    Final       Subjective: No new complaints feels great.  Discharge Exam: Vitals:   02/12/20  0747 02/12/20 0835  BP: (!) 148/71   Pulse: (!) 56 63  Resp: 18   Temp: 98.4 F (36.9 C)   SpO2: 99%    Vitals:   02/12/20 0547 02/12/20 0649 02/12/20 0747 02/12/20 0835  BP: 139/75  (!) 148/71   Pulse: (!) 48  (!) 56 63  Resp:   18   Temp:   98.4 F (36.9 C)   TempSrc:   Oral   SpO2:   99%   Weight:  115.7 kg    Height:        General: Pt is alert, awake, not in acute distress Cardiovascular: RRR, S1/S2 +, no rubs, no gallops Respiratory: CTA bilaterally, no wheezing, no rhonchi Abdominal: Soft, NT, ND, bowel sounds + Extremities: no edema, no cyanosis    The results of significant diagnostics from this hospitalization (including imaging, microbiology, ancillary and laboratory) are listed below for reference.     Microbiology: Recent Results (from the past 240 hour(s))  Culture, blood (Routine x 2)     Status: None   Collection Time: 02/07/20  7:23 AM   Specimen: BLOOD  Result Value Ref Range Status   Specimen Description BLOOD BLOOD LEFT FOREARM  Final   Special Requests   Final    BOTTLES DRAWN AEROBIC AND ANAEROBIC Blood Culture adequate volume   Culture   Final    NO GROWTH 5 DAYS Performed at [facility], [address] [place], [place] [address]    Report Status 02/12/2020 FINAL  Final  Culture, blood (Routine x 2)     Status: None   Collection Time: 02/07/20  7:23 AM   Specimen: BLOOD  Result Value Ref Range Status   Specimen Description BLOOD LEFT WRIST  Final   Special Requests   Final    BOTTLES DRAWN AEROBIC AND ANAEROBIC Blood Culture adequate volume   Culture   Final    NO GROWTH 5 DAYS Performed at [facility], [address], [place],  [address]    Report Status 02/12/2020 FINAL  Final  Respiratory Panel by RT PCR (Flu A&B, Covid) - Nasopharyngeal Swab     Status: None   Collection Time: 02/07/20  8:20 AM   Specimen: Nasopharyngeal Swab  Result Value Ref Range Status   SARS Coronavirus 2 by RT PCR NEGATIVE NEGATIVE Final    Comment: (NOTE) SARS-CoV-2 target nucleic acids are NOT DETECTED. The SARS-CoV-2 RNA is generally detectable in upper respiratoy specimens during the acute phase of infection. The lowest concentration of SARS-CoV-2 viral copies this assay can detect is 131 copies/mL. A negative result does not preclude SARS-Cov-2 infection and should not be used as the sole basis for treatment or other patient management decisions. A negative result may occur with  improper specimen collection/handling, submission of specimen other than nasopharyngeal swab, presence of viral mutation(s) within the areas targeted by this assay, and inadequate number of viral copies (<131 copies/mL). A negative result must be combined with clinical observations, patient history, and epidemiological information. The expected result is Negative. Fact Sheet for Patients:  PinkCheek.be Fact Sheet for Healthcare Providers:  GravelBags.it This test is not yet ap proved or cleared by the [place] FDA and  has been authorized for detection and/or diagnosis of SARS-CoV-2 by FDA under an Emergency Use Authorization (EUA). This EUA will remain  in effect (meaning this test can be used) for the duration of the COVID-19 declaration under Section 564(b)(1) of the Act, 21 U.S.C. section 360bbb-3(b)(1),  unless the authorization is terminated or revoked sooner.    Influenza A by PCR NEGATIVE NEGATIVE Final   Influenza B by PCR NEGATIVE NEGATIVE Final    Comment: (NOTE) The Xpert Xpress SARS-CoV-2/FLU/RSV assay is intended as an aid in  the diagnosis of influenza  from Nasopharyngeal swab specimens and  should not be used as a sole basis for treatment. Nasal washings and  aspirates are unacceptable for Xpert Xpress SARS-CoV-2/FLU/RSV  testing. Fact Sheet for Patients: PinkCheek.be Fact Sheet for Healthcare Providers: GravelBags.it This test is not yet approved or cleared by the [place] FDA and  has been authorized for detection and/or diagnosis of SARS-CoV-2 by  FDA under an Emergency Use Authorization (EUA). This EUA will remain  in effect (meaning this test can be used) for the duration of the  Covid-19 declaration under Section 564(b)(1) of the Act, 21  U.S.C. section 360bbb-3(b)(1), unless the authorization is  terminated or revoked. Performed at [facility], [address] [place], [place] [address]   MRSA PCR Screening     Status: None   Collection Time: 02/07/20  3:01 PM   Specimen: Nasal Mucosa; Nasopharyngeal  Result Value Ref Range Status   MRSA by PCR NEGATIVE NEGATIVE Final    Comment:        The GeneXpert MRSA Assay (FDA approved for NASAL specimens only), is one component of a comprehensive MRSA colonization surveillance program. It is not intended to diagnose MRSA infection nor to guide or monitor treatment for MRSA infections. Performed at [facility], [address] [place], [place] [address]      Labs: BNP (last 3 results) No results for input(s): BNP in the last 8760 hours. Basic Metabolic Panel: Recent Labs  Lab 02/08/20 0342 02/09/20 0409 02/10/20 0526 02/11/20 0517 02/12/20 0336  NA 140 138 136 136 136  K 4.3 4.8 4.8 5.1 4.7  CL 100 100 99 98 96*  CO2 29 28 28 28 27   GLUCOSE 108* 180* 151* 175* 165*  BUN 50* 50* 74* 66* 86*  CREATININE 8.78* 7.81* 9.62* 8.47* 10.07*  CALCIUM 8.8* 9.0 8.7* 8.4* 8.4*   Liver Function Tests: Recent Labs  Lab 02/07/20 0723  AST 30  ALT 30  ALKPHOS 76  BILITOT 0.8  PROT 7.6   ALBUMIN 3.7   No results for input(s): LIPASE, AMYLASE in the last 168 hours. No results for input(s): AMMONIA in the last 168 hours. CBC: Recent Labs  Lab 02/07/20 0723  WBC 6.6  NEUTROABS 3.0  HGB 11.4*  HCT 37.0*  MCV 93.7  PLT 161   Cardiac Enzymes: No results for input(s): CKTOTAL, CKMB, CKMBINDEX, TROPONINI in the last 168 hours. BNP: Invalid input(s): POCBNP CBG: Recent Labs  Lab 02/11/20 0758 02/11/20 1127 02/11/20 1650 02/11/20 2127 02/12/20 0748  GLUCAP 157* 160* 145* 165* 139*   D-Dimer No results for input(s): DDIMER in the last 72 hours. Hgb A1c No results for input(s): HGBA1C in the last 72 hours. Lipid Profile No results for input(s): CHOL, HDL, LDLCALC, TRIG, CHOLHDL, LDLDIRECT in the last 72 hours. Thyroid function studies No results for input(s): TSH, T4TOTAL, T3FREE, THYROIDAB in the last 72 hours.  Invalid input(s): FREET3 Anemia work up No results for input(s): VITAMINB12, FOLATE, FERRITIN, TIBC, IRON, RETICCTPCT in the last 72 hours. Urinalysis    Component Value Date/Time   COLORURINE YELLOW (A) 02/07/2020 0938   APPEARANCEUR CLEAR (A) 02/07/2020 0938   APPEARANCEUR Clear 01/26/2020 1325   LABSPEC 1.010 02/07/2020 2202  PHURINE 9.0 (H) 02/07/2020 0938   GLUCOSEU NEGATIVE 02/07/2020 0938   HGBUR NEGATIVE 02/07/2020 0938   HGBUR negative 01/24/2010 1054   BILIRUBINUR NEGATIVE 02/07/2020 0938   BILIRUBINUR Negative 01/26/2020 1325   KETONESUR NEGATIVE 02/07/2020 0938   PROTEINUR >=300 (A) 02/07/2020 0938   UROBILINOGEN 0.2 08/27/2012 1438   UROBILINOGEN 0.2 01/24/2010 1054   NITRITE NEGATIVE 02/07/2020 0938   LEUKOCYTESUR TRACE (A) 02/07/2020 0938   Sepsis Labs Invalid input(s): PROCALCITONIN,  WBC,  LACTICIDVEN Microbiology Recent Results (from the past 240 hour(s))  Culture, blood (Routine x 2)     Status: None   Collection Time: 02/07/20  7:23 AM   Specimen: BLOOD  Result Value Ref Range Status   Specimen Description BLOOD  BLOOD LEFT FOREARM  Final   Special Requests   Final    BOTTLES DRAWN AEROBIC AND ANAEROBIC Blood Culture adequate volume   Culture   Final    NO GROWTH 5 DAYS Performed at [facility], [address], [place], [place] [address]    Report Status 02/12/2020 FINAL  Final  Culture, blood (Routine x 2)     Status: None   Collection Time: 02/07/20  7:23 AM   Specimen: BLOOD  Result Value Ref Range Status   Specimen Description BLOOD LEFT WRIST  Final   Special Requests   Final    BOTTLES DRAWN AEROBIC AND ANAEROBIC Blood Culture adequate volume   Culture   Final    NO GROWTH 5 DAYS Performed at [facility], [address], [place], [place] [address]    Report Status 02/12/2020 FINAL  Final  Respiratory Panel by RT PCR (Flu A&B, Covid) - Nasopharyngeal Swab     Status: None   Collection Time: 02/07/20  8:20 AM   Specimen: Nasopharyngeal Swab  Result Value Ref Range Status   SARS Coronavirus 2 by RT PCR NEGATIVE NEGATIVE Final    Comment: (NOTE) SARS-CoV-2 target nucleic acids are NOT DETECTED. The SARS-CoV-2 RNA is generally detectable in upper respiratoy specimens during the acute phase of infection. The lowest concentration of SARS-CoV-2 viral copies this assay can detect is 131 copies/mL. A negative result does not preclude SARS-Cov-2 infection and should not be used as the sole basis for treatment or other patient management decisions. A negative result may occur with  improper specimen collection/handling, submission of specimen other than nasopharyngeal swab, presence of viral mutation(s) within the areas targeted by this assay, and inadequate number of viral copies (<131 copies/mL). A negative result must be combined with clinical observations, patient history, and epidemiological information. The expected result is Negative. Fact Sheet for Patients:  PinkCheek.be Fact Sheet for Healthcare Providers:   GravelBags.it This test is not yet ap proved or cleared by the [place] FDA and  has been authorized for detection and/or diagnosis of SARS-CoV-2 by FDA under an Emergency Use Authorization (EUA). This EUA will remain  in effect (meaning this test can be used) for the duration of the COVID-19 declaration under Section 564(b)(1) of the Act, 21 U.S.C. section 360bbb-3(b)(1), unless the authorization is terminated or revoked sooner.    Influenza A by PCR NEGATIVE NEGATIVE Final   Influenza B by PCR NEGATIVE NEGATIVE Final    Comment: (NOTE) The Xpert Xpress SARS-CoV-2/FLU/RSV assay is intended as an aid in  the diagnosis of influenza from Nasopharyngeal swab specimens and  should not be used as a sole basis for treatment. Nasal washings and  aspirates are unacceptable for Xpert Xpress SARS-CoV-2/FLU/RSV  testing. Fact Sheet for Patients: PinkCheek.be Fact Sheet for Healthcare Providers: GravelBags.it This test is not yet approved or cleared by the [place] FDA and  has been authorized for detection and/or diagnosis of SARS-CoV-2 by  FDA under an Emergency Use Authorization (EUA). This EUA will remain  in effect (meaning this test can be used) for the duration of the  Covid-19 declaration under Section 564(b)(1) of the Act, 21  U.S.C. section 360bbb-3(b)(1), unless the authorization is  terminated or revoked. Performed at [facility], [address] [place], [place] [address]   MRSA PCR Screening     Status: None   Collection Time: 02/07/20  3:01 PM   Specimen: Nasal Mucosa; Nasopharyngeal  Result Value Ref Range Status   MRSA by PCR NEGATIVE NEGATIVE Final    Comment:        The GeneXpert MRSA Assay (FDA approved for NASAL specimens only), is one component of a comprehensive MRSA colonization surveillance program. It is not intended to diagnose MRSA infection nor to  guide or monitor treatment for MRSA infections. Performed at [facility], [address] [place],  [address]      Time coordinating discharge: Over 40 minutes  SIGNED:   Charlynne Cousins, MD  [facility] 02/12/2020, 10:06 AM Pager   If 7PM-7AM, please contact night-coverage [email or web address] Password [email or web address]

## 2020-02-15 ENCOUNTER — Inpatient Hospital Stay: Payer: Medicare HMO | Admitting: Internal Medicine

## 2020-02-15 DIAGNOSIS — Z992 Dependence on renal dialysis: Secondary | ICD-10-CM | POA: Diagnosis not present

## 2020-02-15 DIAGNOSIS — N186 End stage renal disease: Secondary | ICD-10-CM | POA: Diagnosis not present

## 2020-02-15 DIAGNOSIS — N2581 Secondary hyperparathyroidism of renal origin: Secondary | ICD-10-CM | POA: Diagnosis not present

## 2020-02-16 ENCOUNTER — Telehealth: Payer: Self-pay

## 2020-02-16 NOTE — Telephone Encounter (Signed)
Spoke to pt to see how he was doing after recent hospital stay. He said he is still weak feeling but getting better everyday. He is set up to see Dr Silvio Pate 02-25-20.

## 2020-02-17 ENCOUNTER — Telehealth (INDEPENDENT_AMBULATORY_CARE_PROVIDER_SITE_OTHER): Payer: Self-pay

## 2020-02-17 DIAGNOSIS — N186 End stage renal disease: Secondary | ICD-10-CM | POA: Diagnosis not present

## 2020-02-17 DIAGNOSIS — R0602 Shortness of breath: Secondary | ICD-10-CM | POA: Diagnosis not present

## 2020-02-17 DIAGNOSIS — I5032 Chronic diastolic (congestive) heart failure: Secondary | ICD-10-CM | POA: Diagnosis not present

## 2020-02-17 DIAGNOSIS — I10 Essential (primary) hypertension: Secondary | ICD-10-CM | POA: Diagnosis not present

## 2020-02-17 DIAGNOSIS — Z992 Dependence on renal dialysis: Secondary | ICD-10-CM | POA: Diagnosis not present

## 2020-02-17 DIAGNOSIS — N2581 Secondary hyperparathyroidism of renal origin: Secondary | ICD-10-CM | POA: Diagnosis not present

## 2020-02-17 DIAGNOSIS — R001 Bradycardia, unspecified: Secondary | ICD-10-CM | POA: Diagnosis not present

## 2020-02-17 DIAGNOSIS — E7800 Pure hypercholesterolemia, unspecified: Secondary | ICD-10-CM | POA: Diagnosis not present

## 2020-02-17 NOTE — Telephone Encounter (Signed)
Spoke with the patient and his wife and he is now scheduled with Dr. Delana Meyer for a permcath removal on 02/23/20 with a 1:00 pm arrival time to the [facility]. Patient will do covid testing on 02/19/20 between 8-1 pm at the [facility]. Pre-procedure instructions were discussed and will be mailed.

## 2020-02-18 ENCOUNTER — Encounter: Payer: Self-pay | Admitting: Urology

## 2020-02-18 ENCOUNTER — Ambulatory Visit: Payer: Medicare HMO | Admitting: Urology

## 2020-02-18 ENCOUNTER — Other Ambulatory Visit: Payer: Self-pay

## 2020-02-18 VITALS — BP 117/61 | HR 43 | Ht 72.5 in | Wt 260.0 lb

## 2020-02-18 DIAGNOSIS — R310 Gross hematuria: Secondary | ICD-10-CM | POA: Diagnosis not present

## 2020-02-18 DIAGNOSIS — N401 Enlarged prostate with lower urinary tract symptoms: Secondary | ICD-10-CM

## 2020-02-18 DIAGNOSIS — N138 Other obstructive and reflux uropathy: Secondary | ICD-10-CM

## 2020-02-18 DIAGNOSIS — N179 Acute kidney failure, unspecified: Secondary | ICD-10-CM | POA: Insufficient documentation

## 2020-02-18 DIAGNOSIS — R319 Hematuria, unspecified: Secondary | ICD-10-CM | POA: Diagnosis not present

## 2020-02-18 MED ORDER — LIDOCAINE HCL URETHRAL/MUCOSAL 2 % EX GEL
1.0000 "application " | Freq: Once | CUTANEOUS | Status: AC
  Administered 2020-02-18: 1 via URETHRAL

## 2020-02-18 NOTE — Progress Notes (Signed)
Cystoscopy Procedure Note:  Indication: Gross hematuria  64 year old male followed by Zara Council, PA for urinary symptoms who was found to have some possible gross hematuria.  After informed consent and discussion of the procedure and its risks, Richard Chen was positioned and prepped in the standard fashion. Cystoscopy was performed with a flexible cystoscope. The urethra, bladder neck and entire bladder was visualized in a standard fashion. The prostate was small. The ureteral orifices were visualized in their normal location and orientation.  Thorough inspection of the bladder revealed no suspicious lesions.  There was herniation of the bladder into the right scrotum and I was able to visualize this with the flexible scope.  Retroflexion within the bladder showed no abnormalities at the base.  Cytology sent.  Imaging: CT abdomen pelvis without contrast dated 10/03/2019 shows herniation of portion of the right bladder into the right inguinal hernia, but no other obvious urologic pathology within the limits of a noncontrast study.  Findings: Normal cystoscopy, bladder partially herniated into right scrotum consistent with CT findings  Assessment and Plan: We will call with cytology results RTC with Larene Beach in 6 months  Nickolas Madrid, MD 02/18/2020

## 2020-02-19 ENCOUNTER — Other Ambulatory Visit
Admission: RE | Admit: 2020-02-19 | Discharge: 2020-02-19 | Disposition: A | Discharge status: Home / Self Care | Payer: Medicare HMO | Source: Ambulatory Visit | Attending: Vascular Surgery | Admitting: Vascular Surgery

## 2020-02-19 DIAGNOSIS — N186 End stage renal disease: Secondary | ICD-10-CM | POA: Diagnosis not present

## 2020-02-19 DIAGNOSIS — Z01812 Encounter for preprocedural laboratory examination: Secondary | ICD-10-CM | POA: Insufficient documentation

## 2020-02-19 DIAGNOSIS — N2581 Secondary hyperparathyroidism of renal origin: Secondary | ICD-10-CM | POA: Diagnosis not present

## 2020-02-19 DIAGNOSIS — Z992 Dependence on renal dialysis: Secondary | ICD-10-CM | POA: Diagnosis not present

## 2020-02-19 DIAGNOSIS — Z20822 Contact with and (suspected) exposure to covid-19: Secondary | ICD-10-CM | POA: Diagnosis not present

## 2020-02-20 DIAGNOSIS — N2581 Secondary hyperparathyroidism of renal origin: Secondary | ICD-10-CM | POA: Diagnosis not present

## 2020-02-20 DIAGNOSIS — N186 End stage renal disease: Secondary | ICD-10-CM | POA: Diagnosis not present

## 2020-02-20 DIAGNOSIS — Z992 Dependence on renal dialysis: Secondary | ICD-10-CM | POA: Diagnosis not present

## 2020-02-20 LAB — SARS CORONAVIRUS 2 (TAT 6-24 HRS): SARS Coronavirus 2: NEGATIVE

## 2020-02-22 ENCOUNTER — Other Ambulatory Visit (INDEPENDENT_AMBULATORY_CARE_PROVIDER_SITE_OTHER): Payer: Self-pay | Admitting: Nurse Practitioner

## 2020-02-22 ENCOUNTER — Ambulatory Visit (INDEPENDENT_AMBULATORY_CARE_PROVIDER_SITE_OTHER): Payer: Medicare HMO | Admitting: Internal Medicine

## 2020-02-22 ENCOUNTER — Other Ambulatory Visit: Payer: Self-pay

## 2020-02-22 ENCOUNTER — Encounter: Payer: Self-pay | Admitting: Internal Medicine

## 2020-02-22 ENCOUNTER — Telehealth: Payer: Self-pay

## 2020-02-22 DIAGNOSIS — Z992 Dependence on renal dialysis: Secondary | ICD-10-CM | POA: Diagnosis not present

## 2020-02-22 DIAGNOSIS — N186 End stage renal disease: Secondary | ICD-10-CM

## 2020-02-22 DIAGNOSIS — I5032 Chronic diastolic (congestive) heart failure: Secondary | ICD-10-CM

## 2020-02-22 DIAGNOSIS — R42 Dizziness and giddiness: Secondary | ICD-10-CM | POA: Diagnosis not present

## 2020-02-22 DIAGNOSIS — N2581 Secondary hyperparathyroidism of renal origin: Secondary | ICD-10-CM | POA: Diagnosis not present

## 2020-02-22 DIAGNOSIS — I10 Essential (primary) hypertension: Secondary | ICD-10-CM | POA: Diagnosis not present

## 2020-02-22 LAB — URINALYSIS, COMPLETE
Bilirubin, UA: NEGATIVE
Glucose, UA: NEGATIVE
Ketones, UA: NEGATIVE
Nitrite, UA: NEGATIVE
RBC, UA: NEGATIVE
Specific Gravity, UA: 1.02 (ref 1.005–1.030)
Urobilinogen, Ur: 0.2 mg/dL (ref 0.2–1.0)
pH, UA: 7.5 (ref 5.0–7.5)

## 2020-02-22 LAB — MICROSCOPIC EXAMINATION
Bacteria, UA: NONE SEEN
WBC, UA: >30 /hpf — AB (ref 0–5)

## 2020-02-22 NOTE — Telephone Encounter (Signed)
[name] - Client Nonclinical Telephone Record AccessNurse Client Richard Chen - Client Client Site [place] Physician Viviana Simpler - MD Contact Type Call Who Is Calling Patient / Member / Family / Caregiver Caller Name [place] Phone Number [phone] Patient Name Richard Chen Patient DOB 1956-01-14 Call Type Message Only Information Provided Reason for Call Request to Speak to a Physician Initial Comment Caller states her husband was in the hospital for fluid overload. They gave him a new medication. He is not able to walk without assistance, his heart rate is running low. When he walks he gets dizzy and loses his balance. Additional Comment Provided office hours , declined triage Disp. Time Disposition Final User 02/22/2020 7:43:03 AM General Information Provided Yes Vinnie Level Call Closed By: Vinnie Level Transaction Date/Time: 02/22/2020 7:39:16 AM (ET)

## 2020-02-22 NOTE — Assessment & Plan Note (Signed)
Being dialyzed through right arm again

## 2020-02-22 NOTE — Telephone Encounter (Signed)
I spoke with Richard Chen; pts symptoms started around return home on 02/12/20; pt cannot walk normal has fallen in yard when coming back from appt on 02/19/20. pts med has been changed at the hospital. Pt is so dizzy cannot walk in house without getting exhausted. No CP and no SOB. Gets exhausted when walking. pts vision changing and pt is seeing bugs at night. BP 156/63 and P 47 at 4:30 AM. Pt is presently at dialysis. Pt is not wanting to go to ED. Pt had covid test on 02/19/20 for procedure for cath out of chest for dialysis and covid test was neg. Larene Beach CMA said OK to change 30' HFU if no SOB to 02/22/20 at 3:15. ED precautions given and Richard Chen voiced understanding. FYI to Dr Silvio Pate.

## 2020-02-22 NOTE — Assessment & Plan Note (Signed)
Seems to be having hypotensive reaction to the clonidine Has cut back on the hydralazine but symptoms persist (plus hydralazine does have utility in CHF) Asked him to stop the clonidine altogether---I would not recommend restarting unless we can use the patch (but usually too expensive)

## 2020-02-22 NOTE — Assessment & Plan Note (Signed)
BP Readings from Last 3 Encounters:  02/22/20 126/64  02/18/20 117/61  02/12/20 (!) 177/73   I think BP is labile on the clonidine-----will stop  Increase hydralazine back to tid if pressure goes up

## 2020-02-22 NOTE — Patient Instructions (Signed)
Please stop the clonidine. If your blood pressure goes up (like over 150/90)----increase the hydralazine back to three times a day.

## 2020-02-22 NOTE — Telephone Encounter (Signed)
Looks like clonidine, hydralazine and metoprolol were just ordered (not sure if all are new though) Clonidine most likely to be causing his symptoms
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
## 2020-02-22 NOTE — Assessment & Plan Note (Signed)
Fluid status better now with the dialysis

## 2020-02-23 ENCOUNTER — Ambulatory Visit
Admission: RE | Admit: 2020-02-23 | Discharge: 2020-02-23 | Disposition: A | Discharge status: Home / Self Care | Payer: Medicare HMO | Attending: Vascular Surgery | Admitting: Vascular Surgery

## 2020-02-23 ENCOUNTER — Encounter
Admission: RE | Disposition: A | Discharge status: Home / Self Care | Payer: Self-pay | Source: Home / Self Care | Attending: Vascular Surgery

## 2020-02-23 DIAGNOSIS — N186 End stage renal disease: Secondary | ICD-10-CM

## 2020-02-23 DIAGNOSIS — Z4901 Encounter for fitting and adjustment of extracorporeal dialysis catheter: Secondary | ICD-10-CM | POA: Insufficient documentation

## 2020-02-23 DIAGNOSIS — Z992 Dependence on renal dialysis: Secondary | ICD-10-CM | POA: Diagnosis not present

## 2020-02-23 HISTORY — PX: DIALYSIS/PERMA CATHETER REMOVAL: CATH118289

## 2020-02-23 SURGERY — DIALYSIS/PERMA CATHETER REMOVAL
Anesthesia: LOCAL

## 2020-02-23 SURGICAL SUPPLY — 2 items
FORCEPS HALSTEAD CVD 5IN STRL (INSTRUMENTS) ×2 IMPLANT
TRAY LACERAT/PLASTIC (MISCELLANEOUS) ×2 IMPLANT

## 2020-02-23 NOTE — H&P (Signed)
[facility] History & Physical Update  The patient was interviewed and re-examined.  The patient's previous History and Physical has been reviewed and is unchanged.  There is no change in the plan of care.  Regana Kemple A Lyonel Morejon, PA-C  02/23/2020, 1:50 PM

## 2020-02-23 NOTE — Op Note (Signed)
Operative Note  Preoperative diagnosis:    1. ESRD with functional permanent access  Postoperative diagnosis:   1. ESRD with functional permanent access  Procedure:  Removal of Left Permcath  Performing Clinician: Hezzie Bump PA-C  Surgeon:  Leotis Pain, MD  Anesthesia:  Local  EBL:  Minimal  Indication for the Procedure:  The patient has a functional permanent dialysis access and no longer needs their permcath.  This can be removed.  Risks and benefits are discussed and informed consent is obtained.  Description of the Procedure:  The patient's left neck, chest and existing catheter were sterilely prepped and draped. The area around the catheter was anesthetized copiously with 1% lidocaine. The catheter was dissected out with curved hemostats until the cuff was freed from the surrounding fibrous sheath. The fiber sheath was transected, and the catheter was then removed in its entirety using gentle traction. Pressure was held and sterile dressings were placed. The patient tolerated the procedure well and was taken to the recovery room in stable condition.  Christell Steinmiller A Torin Whisner  02/23/2020, 1:50 PM  This note was created with Dragon Medical transcription system. Any errors in dictation are purely unintentional.

## 2020-02-24 ENCOUNTER — Encounter: Payer: Self-pay | Admitting: Cardiology

## 2020-02-24 DIAGNOSIS — N186 End stage renal disease: Secondary | ICD-10-CM | POA: Diagnosis not present

## 2020-02-24 DIAGNOSIS — N2581 Secondary hyperparathyroidism of renal origin: Secondary | ICD-10-CM | POA: Diagnosis not present

## 2020-02-24 DIAGNOSIS — Z992 Dependence on renal dialysis: Secondary | ICD-10-CM | POA: Diagnosis not present

## 2020-02-25 ENCOUNTER — Inpatient Hospital Stay: Payer: Medicare HMO | Admitting: Internal Medicine

## 2020-02-26 ENCOUNTER — Other Ambulatory Visit: Payer: Self-pay | Admitting: Urology

## 2020-02-26 DIAGNOSIS — N2581 Secondary hyperparathyroidism of renal origin: Secondary | ICD-10-CM | POA: Diagnosis not present

## 2020-02-26 DIAGNOSIS — N186 End stage renal disease: Secondary | ICD-10-CM | POA: Diagnosis not present

## 2020-02-26 DIAGNOSIS — Z992 Dependence on renal dialysis: Secondary | ICD-10-CM | POA: Diagnosis not present

## 2020-02-29 DIAGNOSIS — Z992 Dependence on renal dialysis: Secondary | ICD-10-CM | POA: Diagnosis not present

## 2020-02-29 DIAGNOSIS — N2581 Secondary hyperparathyroidism of renal origin: Secondary | ICD-10-CM | POA: Diagnosis not present

## 2020-02-29 DIAGNOSIS — N186 End stage renal disease: Secondary | ICD-10-CM | POA: Diagnosis not present

## 2020-03-02 DIAGNOSIS — N186 End stage renal disease: Secondary | ICD-10-CM | POA: Diagnosis not present

## 2020-03-02 DIAGNOSIS — Z992 Dependence on renal dialysis: Secondary | ICD-10-CM | POA: Diagnosis not present

## 2020-03-02 DIAGNOSIS — N2581 Secondary hyperparathyroidism of renal origin: Secondary | ICD-10-CM | POA: Diagnosis not present

## 2020-03-04 DIAGNOSIS — Z992 Dependence on renal dialysis: Secondary | ICD-10-CM | POA: Diagnosis not present

## 2020-03-04 DIAGNOSIS — N186 End stage renal disease: Secondary | ICD-10-CM | POA: Diagnosis not present

## 2020-03-04 DIAGNOSIS — N2581 Secondary hyperparathyroidism of renal origin: Secondary | ICD-10-CM | POA: Diagnosis not present

## 2020-03-07 DIAGNOSIS — N186 End stage renal disease: Secondary | ICD-10-CM | POA: Diagnosis not present

## 2020-03-07 DIAGNOSIS — Z992 Dependence on renal dialysis: Secondary | ICD-10-CM | POA: Diagnosis not present

## 2020-03-07 DIAGNOSIS — N2581 Secondary hyperparathyroidism of renal origin: Secondary | ICD-10-CM | POA: Diagnosis not present

## 2020-03-09 DIAGNOSIS — N2581 Secondary hyperparathyroidism of renal origin: Secondary | ICD-10-CM | POA: Diagnosis not present

## 2020-03-09 DIAGNOSIS — N186 End stage renal disease: Secondary | ICD-10-CM | POA: Diagnosis not present

## 2020-03-09 DIAGNOSIS — Z992 Dependence on renal dialysis: Secondary | ICD-10-CM | POA: Diagnosis not present

## 2020-03-11 DIAGNOSIS — N186 End stage renal disease: Secondary | ICD-10-CM | POA: Diagnosis not present

## 2020-03-11 DIAGNOSIS — N2581 Secondary hyperparathyroidism of renal origin: Secondary | ICD-10-CM | POA: Diagnosis not present

## 2020-03-11 DIAGNOSIS — Z992 Dependence on renal dialysis: Secondary | ICD-10-CM | POA: Diagnosis not present

## 2020-03-14 DIAGNOSIS — N186 End stage renal disease: Secondary | ICD-10-CM | POA: Diagnosis not present

## 2020-03-14 DIAGNOSIS — N2581 Secondary hyperparathyroidism of renal origin: Secondary | ICD-10-CM | POA: Diagnosis not present

## 2020-03-14 DIAGNOSIS — Z992 Dependence on renal dialysis: Secondary | ICD-10-CM | POA: Diagnosis not present

## 2020-03-16 DIAGNOSIS — N2581 Secondary hyperparathyroidism of renal origin: Secondary | ICD-10-CM | POA: Diagnosis not present

## 2020-03-16 DIAGNOSIS — Z992 Dependence on renal dialysis: Secondary | ICD-10-CM | POA: Diagnosis not present

## 2020-03-16 DIAGNOSIS — N186 End stage renal disease: Secondary | ICD-10-CM | POA: Diagnosis not present

## 2020-03-17 ENCOUNTER — Ambulatory Visit (INDEPENDENT_AMBULATORY_CARE_PROVIDER_SITE_OTHER): Payer: Medicare HMO

## 2020-03-17 ENCOUNTER — Ambulatory Visit (INDEPENDENT_AMBULATORY_CARE_PROVIDER_SITE_OTHER): Payer: Medicare HMO | Admitting: Nurse Practitioner

## 2020-03-17 ENCOUNTER — Telehealth: Payer: Self-pay | Admitting: Internal Medicine

## 2020-03-17 ENCOUNTER — Other Ambulatory Visit: Payer: Self-pay

## 2020-03-17 VITALS — BP 162/64 | HR 63 | Ht 73.0 in | Wt 250.0 lb

## 2020-03-17 DIAGNOSIS — E7800 Pure hypercholesterolemia, unspecified: Secondary | ICD-10-CM | POA: Diagnosis not present

## 2020-03-17 DIAGNOSIS — Z992 Dependence on renal dialysis: Secondary | ICD-10-CM

## 2020-03-17 DIAGNOSIS — I10 Essential (primary) hypertension: Secondary | ICD-10-CM | POA: Diagnosis not present

## 2020-03-17 DIAGNOSIS — N186 End stage renal disease: Secondary | ICD-10-CM | POA: Diagnosis not present

## 2020-03-17 NOTE — Progress Notes (Signed)
  Chronic Care Management   Note  03/17/2020 Name: ISHMEL ACEVEDO MRN: [id number] DOB: 02/03/1956  LEVONE OTTEN is a 64 y.o. year old male who is a primary care patient of [place], Theophilus Kinds, MD. I reached out to [employer] by phone today in response to a referral sent by Mr. Lorenz Coaster [place]'s PCP, Venia Carbon, MD.   Mr. Dirico was given information about Chronic Care Management services today including:  1. CCM service includes personalized support from designated clinical staff supervised by his physician, including individualized plan of care and coordination with other care providers 2. 24/7 contact phone numbers for assistance for urgent and routine care needs. 3. Service will only be billed when office clinical staff spend 20 minutes or more in a month to coordinate care. 4. Only one practitioner may furnish and bill the service in a calendar month. 5. The patient may stop CCM services at any time (effective at the end of the month) by phone call to the office staff.   Patient agreed to services and verbal consent obtained.   This note is not being shared with the patient for the following reason: To respect privacy (The patient or proxy has requested that the information not be shared).  Follow up plan:   Earney Hamburg Upstream Scheduler

## 2020-03-17 NOTE — Progress Notes (Signed)
No cancer cells on urine sample from cysto, follow up as scheduled  Nickolas Madrid, MD 03/17/2020

## 2020-03-18 ENCOUNTER — Telehealth: Payer: Self-pay

## 2020-03-18 DIAGNOSIS — Z992 Dependence on renal dialysis: Secondary | ICD-10-CM | POA: Diagnosis not present

## 2020-03-18 DIAGNOSIS — N2581 Secondary hyperparathyroidism of renal origin: Secondary | ICD-10-CM | POA: Diagnosis not present

## 2020-03-18 DIAGNOSIS — N186 End stage renal disease: Secondary | ICD-10-CM | POA: Diagnosis not present

## 2020-03-18 NOTE — Telephone Encounter (Signed)
Called pt informed him of negative cytology results. Pt gave verbal understanding.   No cancer cells on urine sample from cysto, follow up as scheduled  Nickolas Madrid, MD 03/17/2020

## 2020-03-18 NOTE — Telephone Encounter (Signed)
-----   Message from Billey Co, MD sent at 03/17/2020  8:20 AM EDT -----   ----- Message ----- From: Margarita Grizzle Sent: 02/26/2020  11:20 AM EDT To: Billey Co, MD

## 2020-03-21 DIAGNOSIS — N2581 Secondary hyperparathyroidism of renal origin: Secondary | ICD-10-CM | POA: Diagnosis not present

## 2020-03-21 DIAGNOSIS — Z992 Dependence on renal dialysis: Secondary | ICD-10-CM | POA: Diagnosis not present

## 2020-03-21 DIAGNOSIS — N186 End stage renal disease: Secondary | ICD-10-CM | POA: Diagnosis not present

## 2020-03-22 ENCOUNTER — Other Ambulatory Visit (INDEPENDENT_AMBULATORY_CARE_PROVIDER_SITE_OTHER): Payer: Self-pay | Admitting: Nurse Practitioner

## 2020-03-22 ENCOUNTER — Encounter (INDEPENDENT_AMBULATORY_CARE_PROVIDER_SITE_OTHER): Payer: Self-pay | Admitting: Nurse Practitioner

## 2020-03-22 DIAGNOSIS — Z992 Dependence on renal dialysis: Secondary | ICD-10-CM | POA: Diagnosis not present

## 2020-03-22 DIAGNOSIS — N2581 Secondary hyperparathyroidism of renal origin: Secondary | ICD-10-CM | POA: Diagnosis not present

## 2020-03-22 DIAGNOSIS — N186 End stage renal disease: Secondary | ICD-10-CM | POA: Diagnosis not present
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
## 2020-03-23 DIAGNOSIS — Z992 Dependence on renal dialysis: Secondary | ICD-10-CM | POA: Diagnosis not present

## 2020-03-23 DIAGNOSIS — N186 End stage renal disease: Secondary | ICD-10-CM | POA: Diagnosis not present

## 2020-03-23 DIAGNOSIS — N2581 Secondary hyperparathyroidism of renal origin: Secondary | ICD-10-CM | POA: Diagnosis not present

## 2020-03-25 DIAGNOSIS — N186 End stage renal disease: Secondary | ICD-10-CM | POA: Diagnosis not present

## 2020-03-25 DIAGNOSIS — N2581 Secondary hyperparathyroidism of renal origin: Secondary | ICD-10-CM | POA: Diagnosis not present

## 2020-03-25 DIAGNOSIS — Z992 Dependence on renal dialysis: Secondary | ICD-10-CM | POA: Diagnosis not present

## 2020-03-28 DIAGNOSIS — N2581 Secondary hyperparathyroidism of renal origin: Secondary | ICD-10-CM | POA: Diagnosis not present

## 2020-03-28 DIAGNOSIS — Z992 Dependence on renal dialysis: Secondary | ICD-10-CM | POA: Diagnosis not present

## 2020-03-28 DIAGNOSIS — N186 End stage renal disease: Secondary | ICD-10-CM | POA: Diagnosis not present

## 2020-03-28 DIAGNOSIS — Z7682 Awaiting organ transplant status: Secondary | ICD-10-CM | POA: Diagnosis not present

## 2020-03-30 ENCOUNTER — Other Ambulatory Visit: Payer: Self-pay | Admitting: Internal Medicine

## 2020-03-30 DIAGNOSIS — N2581 Secondary hyperparathyroidism of renal origin: Secondary | ICD-10-CM | POA: Diagnosis not present

## 2020-03-30 DIAGNOSIS — Z992 Dependence on renal dialysis: Secondary | ICD-10-CM | POA: Diagnosis not present

## 2020-03-30 DIAGNOSIS — N186 End stage renal disease: Secondary | ICD-10-CM | POA: Diagnosis not present

## 2020-04-01 DIAGNOSIS — N2581 Secondary hyperparathyroidism of renal origin: Secondary | ICD-10-CM | POA: Diagnosis not present

## 2020-04-01 DIAGNOSIS — N186 End stage renal disease: Secondary | ICD-10-CM | POA: Diagnosis not present

## 2020-04-01 DIAGNOSIS — Z992 Dependence on renal dialysis: Secondary | ICD-10-CM | POA: Diagnosis not present

## 2020-04-04 DIAGNOSIS — N2581 Secondary hyperparathyroidism of renal origin: Secondary | ICD-10-CM | POA: Diagnosis not present

## 2020-04-04 DIAGNOSIS — N186 End stage renal disease: Secondary | ICD-10-CM | POA: Diagnosis not present

## 2020-04-04 DIAGNOSIS — Z992 Dependence on renal dialysis: Secondary | ICD-10-CM | POA: Diagnosis not present

## 2020-04-06 DIAGNOSIS — N186 End stage renal disease: Secondary | ICD-10-CM | POA: Diagnosis not present

## 2020-04-06 DIAGNOSIS — N2581 Secondary hyperparathyroidism of renal origin: Secondary | ICD-10-CM | POA: Diagnosis not present

## 2020-04-06 DIAGNOSIS — Z992 Dependence on renal dialysis: Secondary | ICD-10-CM | POA: Diagnosis not present

## 2020-04-08 DIAGNOSIS — Z992 Dependence on renal dialysis: Secondary | ICD-10-CM | POA: Diagnosis not present

## 2020-04-08 DIAGNOSIS — N186 End stage renal disease: Secondary | ICD-10-CM | POA: Diagnosis not present

## 2020-04-08 DIAGNOSIS — N2581 Secondary hyperparathyroidism of renal origin: Secondary | ICD-10-CM | POA: Diagnosis not present

## 2020-04-10 DIAGNOSIS — Z992 Dependence on renal dialysis: Secondary | ICD-10-CM | POA: Diagnosis not present

## 2020-04-10 DIAGNOSIS — N186 End stage renal disease: Secondary | ICD-10-CM | POA: Diagnosis not present

## 2020-04-10 DIAGNOSIS — N2581 Secondary hyperparathyroidism of renal origin: Secondary | ICD-10-CM | POA: Diagnosis not present

## 2020-04-11 DIAGNOSIS — Z992 Dependence on renal dialysis: Secondary | ICD-10-CM | POA: Diagnosis not present

## 2020-04-11 DIAGNOSIS — N186 End stage renal disease: Secondary | ICD-10-CM | POA: Diagnosis not present

## 2020-04-11 DIAGNOSIS — N2581 Secondary hyperparathyroidism of renal origin: Secondary | ICD-10-CM | POA: Diagnosis not present

## 2020-04-13 DIAGNOSIS — Z992 Dependence on renal dialysis: Secondary | ICD-10-CM | POA: Diagnosis not present

## 2020-04-13 DIAGNOSIS — N2581 Secondary hyperparathyroidism of renal origin: Secondary | ICD-10-CM | POA: Diagnosis not present

## 2020-04-13 DIAGNOSIS — N186 End stage renal disease: Secondary | ICD-10-CM | POA: Diagnosis not present

## 2020-04-15 DIAGNOSIS — Z992 Dependence on renal dialysis: Secondary | ICD-10-CM | POA: Diagnosis not present

## 2020-04-15 DIAGNOSIS — N186 End stage renal disease: Secondary | ICD-10-CM | POA: Diagnosis not present

## 2020-04-15 DIAGNOSIS — N2581 Secondary hyperparathyroidism of renal origin: Secondary | ICD-10-CM | POA: Diagnosis not present

## 2020-04-18 DIAGNOSIS — N2581 Secondary hyperparathyroidism of renal origin: Secondary | ICD-10-CM | POA: Diagnosis not present

## 2020-04-18 DIAGNOSIS — N186 End stage renal disease: Secondary | ICD-10-CM | POA: Diagnosis not present

## 2020-04-18 DIAGNOSIS — Z992 Dependence on renal dialysis: Secondary | ICD-10-CM | POA: Diagnosis not present

## 2020-04-20 DIAGNOSIS — Z992 Dependence on renal dialysis: Secondary | ICD-10-CM | POA: Diagnosis not present

## 2020-04-20 DIAGNOSIS — N2581 Secondary hyperparathyroidism of renal origin: Secondary | ICD-10-CM | POA: Diagnosis not present

## 2020-04-20 DIAGNOSIS — N186 End stage renal disease: Secondary | ICD-10-CM | POA: Diagnosis not present

## 2020-04-21 DIAGNOSIS — N186 End stage renal disease: Secondary | ICD-10-CM | POA: Diagnosis not present

## 2020-04-21 DIAGNOSIS — Z940 Kidney transplant status: Secondary | ICD-10-CM

## 2020-04-21 DIAGNOSIS — N2581 Secondary hyperparathyroidism of renal origin: Secondary | ICD-10-CM | POA: Diagnosis not present

## 2020-04-21 DIAGNOSIS — Z992 Dependence on renal dialysis: Secondary | ICD-10-CM | POA: Diagnosis not present

## 2020-04-21 HISTORY — DX: Kidney transplant status: Z94.0

## 2020-04-22 DIAGNOSIS — N186 End stage renal disease: Secondary | ICD-10-CM | POA: Diagnosis not present

## 2020-04-22 DIAGNOSIS — Z992 Dependence on renal dialysis: Secondary | ICD-10-CM | POA: Diagnosis not present

## 2020-04-22 DIAGNOSIS — N2581 Secondary hyperparathyroidism of renal origin: Secondary | ICD-10-CM | POA: Diagnosis not present

## 2020-04-25 DIAGNOSIS — N186 End stage renal disease: Secondary | ICD-10-CM | POA: Diagnosis not present

## 2020-04-25 DIAGNOSIS — Z992 Dependence on renal dialysis: Secondary | ICD-10-CM | POA: Diagnosis not present

## 2020-04-25 DIAGNOSIS — N2581 Secondary hyperparathyroidism of renal origin: Secondary | ICD-10-CM | POA: Diagnosis not present

## 2020-04-27 DIAGNOSIS — N2581 Secondary hyperparathyroidism of renal origin: Secondary | ICD-10-CM | POA: Diagnosis not present

## 2020-04-27 DIAGNOSIS — Z992 Dependence on renal dialysis: Secondary | ICD-10-CM | POA: Diagnosis not present

## 2020-04-27 DIAGNOSIS — N186 End stage renal disease: Secondary | ICD-10-CM | POA: Diagnosis not present

## 2020-04-29 DIAGNOSIS — N2581 Secondary hyperparathyroidism of renal origin: Secondary | ICD-10-CM | POA: Diagnosis not present

## 2020-04-29 DIAGNOSIS — N186 End stage renal disease: Secondary | ICD-10-CM | POA: Diagnosis not present

## 2020-04-29 DIAGNOSIS — Z992 Dependence on renal dialysis: Secondary | ICD-10-CM | POA: Diagnosis not present

## 2020-05-02 DIAGNOSIS — Z992 Dependence on renal dialysis: Secondary | ICD-10-CM | POA: Diagnosis not present

## 2020-05-02 DIAGNOSIS — E119 Type 2 diabetes mellitus without complications: Secondary | ICD-10-CM | POA: Diagnosis not present

## 2020-05-02 DIAGNOSIS — Z794 Long term (current) use of insulin: Secondary | ICD-10-CM | POA: Diagnosis not present

## 2020-05-02 DIAGNOSIS — N186 End stage renal disease: Secondary | ICD-10-CM | POA: Diagnosis not present

## 2020-05-02 DIAGNOSIS — N2581 Secondary hyperparathyroidism of renal origin: Secondary | ICD-10-CM | POA: Diagnosis not present

## 2020-05-04 DIAGNOSIS — Z992 Dependence on renal dialysis: Secondary | ICD-10-CM | POA: Diagnosis not present

## 2020-05-04 DIAGNOSIS — N186 End stage renal disease: Secondary | ICD-10-CM | POA: Diagnosis not present

## 2020-05-04 DIAGNOSIS — N2581 Secondary hyperparathyroidism of renal origin: Secondary | ICD-10-CM | POA: Diagnosis not present

## 2020-05-06 DIAGNOSIS — N2581 Secondary hyperparathyroidism of renal origin: Secondary | ICD-10-CM | POA: Diagnosis not present

## 2020-05-06 DIAGNOSIS — Z992 Dependence on renal dialysis: Secondary | ICD-10-CM | POA: Diagnosis not present

## 2020-05-06 DIAGNOSIS — N186 End stage renal disease: Secondary | ICD-10-CM | POA: Diagnosis not present

## 2020-05-09 ENCOUNTER — Other Ambulatory Visit: Payer: Self-pay

## 2020-05-09 ENCOUNTER — Emergency Department
Admission: EM | Admit: 2020-05-09 | Discharge: 2020-05-09 | Disposition: A | Discharge status: Home / Self Care | Payer: Medicare HMO | Attending: Emergency Medicine | Admitting: Emergency Medicine

## 2020-05-09 DIAGNOSIS — I959 Hypotension, unspecified: Secondary | ICD-10-CM | POA: Diagnosis not present

## 2020-05-09 DIAGNOSIS — I132 Hypertensive heart and chronic kidney disease with heart failure and with stage 5 chronic kidney disease, or end stage renal disease: Secondary | ICD-10-CM | POA: Diagnosis not present

## 2020-05-09 DIAGNOSIS — Z992 Dependence on renal dialysis: Secondary | ICD-10-CM | POA: Insufficient documentation

## 2020-05-09 DIAGNOSIS — N2581 Secondary hyperparathyroidism of renal origin: Secondary | ICD-10-CM | POA: Diagnosis not present

## 2020-05-09 DIAGNOSIS — E1122 Type 2 diabetes mellitus with diabetic chronic kidney disease: Secondary | ICD-10-CM | POA: Insufficient documentation

## 2020-05-09 DIAGNOSIS — R110 Nausea: Secondary | ICD-10-CM | POA: Diagnosis not present

## 2020-05-09 DIAGNOSIS — N186 End stage renal disease: Secondary | ICD-10-CM | POA: Diagnosis not present

## 2020-05-09 DIAGNOSIS — I509 Heart failure, unspecified: Secondary | ICD-10-CM | POA: Diagnosis not present

## 2020-05-09 LAB — COMPREHENSIVE METABOLIC PANEL
ALT: 14 U/L (ref 0–44)
AST: 15 U/L (ref 15–41)
Albumin: 4.1 g/dL (ref 3.5–5.0)
Alkaline Phosphatase: 74 U/L (ref 38–126)
Anion gap: 10 (ref 5–15)
BUN: 37 mg/dL — ABNORMAL HIGH (ref 8–23)
CO2: 30 mmol/L (ref 22–32)
Calcium: 8.1 mg/dL — ABNORMAL LOW (ref 8.9–10.3)
Chloride: 96 mmol/L — ABNORMAL LOW (ref 98–111)
Creatinine, Ser: 6.66 mg/dL — ABNORMAL HIGH (ref 0.61–1.24)
GFR calc Af Amer: 9 mL/min — ABNORMAL LOW (ref >60)
GFR calc non Af Amer: 8 mL/min — ABNORMAL LOW (ref >60)
Glucose, Bld: 151 mg/dL — ABNORMAL HIGH (ref 70–99)
Potassium: 3.7 mmol/L (ref 3.5–5.1)
Sodium: 136 mmol/L (ref 135–145)
Total Bilirubin: 0.7 mg/dL (ref 0.3–1.2)
Total Protein: 7.9 g/dL (ref 6.5–8.1)

## 2020-05-09 LAB — CBC
HCT: 33.7 % — ABNORMAL LOW (ref 39.0–52.0)
Hemoglobin: 11.1 g/dL — ABNORMAL LOW (ref 13.0–17.0)
MCH: 29.6 pg (ref 26.0–34.0)
MCHC: 32.9 g/dL (ref 30.0–36.0)
MCV: 89.9 fL (ref 80.0–100.0)
Platelets: 146 10*3/uL — ABNORMAL LOW (ref 150–400)
RBC: 3.75 MIL/uL — ABNORMAL LOW (ref 4.22–5.81)
RDW: 15.2 % (ref 11.5–15.5)
WBC: 7.7 10*3/uL (ref 4.0–10.5)
nRBC: 0 % (ref 0.0–0.2)

## 2020-05-09 LAB — LIPASE, BLOOD: Lipase: 66 U/L — ABNORMAL HIGH (ref 11–51)

## 2020-05-09 MED ORDER — SODIUM CHLORIDE 0.9% FLUSH: 3.0000 mL | Freq: Once | INTRAVENOUS | Status: DC

## 2020-05-09 MED ORDER — ONDANSETRON 4 MG PO TBDP
4.0000 mg | ORAL_TABLET | Freq: Three times a day (TID) | ORAL | 0 refills | Status: DC | PRN
Start: 2020-05-09 — End: 2020-08-25

## 2020-05-09 MED ORDER — ONDANSETRON 4 MG PO TBDP
4.0000 mg | ORAL_TABLET | Freq: Once | ORAL | Status: AC
  Administered 2020-05-09: 4 mg via ORAL
  Filled 2020-05-09: qty 1
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
## 2020-05-09 NOTE — ED Notes (Signed)
Signature pad not working. 

## 2020-05-09 NOTE — ED Notes (Signed)
Pt discharged home after verbalizing understanding of discharge instructions; nad noted. 

## 2020-05-09 NOTE — ED Triage Notes (Signed)
Pt comes from dialysis with c/o hypotension and nausea. Pt states he was about 30 minutes from finishing treatment and started to not feel well. Pt states they told him his BP was low.  Pt states he just doesn't feel well.   Current BP-157/61

## 2020-05-10 DIAGNOSIS — E119 Type 2 diabetes mellitus without complications: Secondary | ICD-10-CM | POA: Diagnosis not present

## 2020-05-10 LAB — HM DIABETES EYE EXAM

## 2020-05-11 DIAGNOSIS — N186 End stage renal disease: Secondary | ICD-10-CM | POA: Diagnosis not present

## 2020-05-11 DIAGNOSIS — Z992 Dependence on renal dialysis: Secondary | ICD-10-CM | POA: Diagnosis not present

## 2020-05-11 DIAGNOSIS — N2581 Secondary hyperparathyroidism of renal origin: Secondary | ICD-10-CM | POA: Diagnosis not present

## 2020-05-13 DIAGNOSIS — Z992 Dependence on renal dialysis: Secondary | ICD-10-CM | POA: Diagnosis not present

## 2020-05-13 DIAGNOSIS — N186 End stage renal disease: Secondary | ICD-10-CM | POA: Diagnosis not present

## 2020-05-13 DIAGNOSIS — N2581 Secondary hyperparathyroidism of renal origin: Secondary | ICD-10-CM | POA: Diagnosis not present

## 2020-05-14 DIAGNOSIS — N189 Chronic kidney disease, unspecified: Secondary | ICD-10-CM | POA: Diagnosis not present

## 2020-05-14 DIAGNOSIS — E1129 Type 2 diabetes mellitus with other diabetic kidney complication: Secondary | ICD-10-CM | POA: Diagnosis not present

## 2020-05-14 DIAGNOSIS — E875 Hyperkalemia: Secondary | ICD-10-CM | POA: Diagnosis not present

## 2020-05-14 DIAGNOSIS — I5022 Chronic systolic (congestive) heart failure: Secondary | ICD-10-CM | POA: Diagnosis not present

## 2020-05-14 DIAGNOSIS — R739 Hyperglycemia, unspecified: Secondary | ICD-10-CM | POA: Diagnosis not present

## 2020-05-14 DIAGNOSIS — D84821 Immunodeficiency due to drugs: Secondary | ICD-10-CM | POA: Diagnosis not present

## 2020-05-14 DIAGNOSIS — D849 Immunodeficiency, unspecified: Secondary | ICD-10-CM | POA: Diagnosis not present

## 2020-05-14 DIAGNOSIS — E1122 Type 2 diabetes mellitus with diabetic chronic kidney disease: Secondary | ICD-10-CM | POA: Diagnosis not present

## 2020-05-14 DIAGNOSIS — E7800 Pure hypercholesterolemia, unspecified: Secondary | ICD-10-CM | POA: Diagnosis not present

## 2020-05-14 DIAGNOSIS — F1011 Alcohol abuse, in remission: Secondary | ICD-10-CM | POA: Diagnosis not present

## 2020-05-14 DIAGNOSIS — Z20822 Contact with and (suspected) exposure to covid-19: Secondary | ICD-10-CM | POA: Diagnosis not present

## 2020-05-14 DIAGNOSIS — M1990 Unspecified osteoarthritis, unspecified site: Secondary | ICD-10-CM | POA: Diagnosis not present

## 2020-05-14 DIAGNOSIS — Z7682 Awaiting organ transplant status: Secondary | ICD-10-CM | POA: Diagnosis not present

## 2020-05-14 DIAGNOSIS — T8619 Other complication of kidney transplant: Secondary | ICD-10-CM | POA: Diagnosis not present

## 2020-05-14 DIAGNOSIS — Z949 Transplanted organ and tissue status, unspecified: Secondary | ICD-10-CM | POA: Diagnosis not present

## 2020-05-14 DIAGNOSIS — I429 Cardiomyopathy, unspecified: Secondary | ICD-10-CM | POA: Diagnosis not present

## 2020-05-14 DIAGNOSIS — E1165 Type 2 diabetes mellitus with hyperglycemia: Secondary | ICD-10-CM | POA: Diagnosis not present

## 2020-05-14 DIAGNOSIS — M109 Gout, unspecified: Secondary | ICD-10-CM | POA: Diagnosis not present

## 2020-05-14 DIAGNOSIS — E669 Obesity, unspecified: Secondary | ICD-10-CM | POA: Diagnosis not present

## 2020-05-14 DIAGNOSIS — R45850 Homicidal ideations: Secondary | ICD-10-CM | POA: Diagnosis not present

## 2020-05-14 DIAGNOSIS — Z87820 Personal history of traumatic brain injury: Secondary | ICD-10-CM | POA: Diagnosis not present

## 2020-05-14 DIAGNOSIS — N186 End stage renal disease: Secondary | ICD-10-CM | POA: Diagnosis not present

## 2020-05-14 DIAGNOSIS — Z940 Kidney transplant status: Secondary | ICD-10-CM | POA: Diagnosis not present

## 2020-05-14 DIAGNOSIS — I132 Hypertensive heart and chronic kidney disease with heart failure and with stage 5 chronic kidney disease, or end stage renal disease: Secondary | ICD-10-CM | POA: Diagnosis not present

## 2020-05-14 DIAGNOSIS — Z992 Dependence on renal dialysis: Secondary | ICD-10-CM | POA: Diagnosis not present

## 2020-05-14 DIAGNOSIS — T380X5A Adverse effect of glucocorticoids and synthetic analogues, initial encounter: Secondary | ICD-10-CM | POA: Diagnosis not present

## 2020-05-14 DIAGNOSIS — E785 Hyperlipidemia, unspecified: Secondary | ICD-10-CM | POA: Diagnosis not present

## 2020-05-14 DIAGNOSIS — I10 Essential (primary) hypertension: Secondary | ICD-10-CM | POA: Diagnosis not present

## 2020-05-14 DIAGNOSIS — F39 Unspecified mood [affective] disorder: Secondary | ICD-10-CM | POA: Diagnosis not present

## 2020-05-14 HISTORY — PX: KIDNEY TRANSPLANT: SHX239

## 2020-05-16 ENCOUNTER — Telehealth: Payer: Self-pay

## 2020-05-16 NOTE — Telephone Encounter (Signed)
Called pt to see how he was doing after recent ER visit. He is in the hospital after receiving a Kidney Transplant. Stated he is doing well.

## 2020-05-16 NOTE — Telephone Encounter (Signed)
Oh--that is GREAT news! Didn't hear that he had gotten kidney but this is great.

## 2020-05-21 DIAGNOSIS — Z992 Dependence on renal dialysis: Secondary | ICD-10-CM | POA: Diagnosis not present

## 2020-05-21 DIAGNOSIS — N186 End stage renal disease: Secondary | ICD-10-CM | POA: Diagnosis not present

## 2020-05-21 DIAGNOSIS — D849 Immunodeficiency, unspecified: Secondary | ICD-10-CM | POA: Diagnosis not present

## 2020-05-21 DIAGNOSIS — Z940 Kidney transplant status: Secondary | ICD-10-CM | POA: Diagnosis not present

## 2020-05-23 ENCOUNTER — Encounter: Payer: Self-pay | Admitting: Internal Medicine

## 2020-05-23 DIAGNOSIS — Z940 Kidney transplant status: Secondary | ICD-10-CM | POA: Diagnosis not present

## 2020-05-25 DIAGNOSIS — D84821 Immunodeficiency due to drugs: Secondary | ICD-10-CM | POA: Diagnosis not present

## 2020-05-25 DIAGNOSIS — I120 Hypertensive chronic kidney disease with stage 5 chronic kidney disease or end stage renal disease: Secondary | ICD-10-CM | POA: Diagnosis not present

## 2020-05-25 DIAGNOSIS — T8619 Other complication of kidney transplant: Secondary | ICD-10-CM | POA: Diagnosis not present

## 2020-05-25 DIAGNOSIS — Z79899 Other long term (current) drug therapy: Secondary | ICD-10-CM | POA: Diagnosis not present

## 2020-05-25 DIAGNOSIS — E1122 Type 2 diabetes mellitus with diabetic chronic kidney disease: Secondary | ICD-10-CM | POA: Diagnosis not present

## 2020-05-25 DIAGNOSIS — F39 Unspecified mood [affective] disorder: Secondary | ICD-10-CM | POA: Diagnosis not present

## 2020-05-25 DIAGNOSIS — Z4822 Encounter for aftercare following kidney transplant: Secondary | ICD-10-CM | POA: Diagnosis not present

## 2020-05-25 DIAGNOSIS — M109 Gout, unspecified: Secondary | ICD-10-CM | POA: Diagnosis not present

## 2020-05-25 DIAGNOSIS — N400 Enlarged prostate without lower urinary tract symptoms: Secondary | ICD-10-CM | POA: Diagnosis not present

## 2020-05-25 DIAGNOSIS — I10 Essential (primary) hypertension: Secondary | ICD-10-CM | POA: Diagnosis not present

## 2020-05-25 DIAGNOSIS — Z940 Kidney transplant status: Secondary | ICD-10-CM | POA: Diagnosis not present

## 2020-05-25 DIAGNOSIS — D849 Immunodeficiency, unspecified: Secondary | ICD-10-CM | POA: Diagnosis not present

## 2020-05-25 DIAGNOSIS — E785 Hyperlipidemia, unspecified: Secondary | ICD-10-CM | POA: Diagnosis not present

## 2020-05-25 DIAGNOSIS — Z949 Transplanted organ and tissue status, unspecified: Secondary | ICD-10-CM | POA: Diagnosis not present

## 2020-05-25 DIAGNOSIS — E119 Type 2 diabetes mellitus without complications: Secondary | ICD-10-CM | POA: Diagnosis not present

## 2020-05-25 DIAGNOSIS — N186 End stage renal disease: Secondary | ICD-10-CM | POA: Diagnosis not present

## 2020-05-25 DIAGNOSIS — K219 Gastro-esophageal reflux disease without esophagitis: Secondary | ICD-10-CM | POA: Diagnosis not present

## 2020-05-27 ENCOUNTER — Telehealth: Payer: Self-pay | Admitting: Internal Medicine

## 2020-05-27 NOTE — Progress Notes (Signed)
  Chronic Care Management   Note  05/27/2020 Name: Richard Chen MRN: [id number] DOB: 1955-12-26  Richard Chen is a 64 y.o. year old male who is a primary care patient of Letvak, Theophilus Kinds, MD. I reached out to [employer] by phone today in response to a referral sent by [name]'s PCP, Venia Carbon, MD.   Richard Chen was given information about Chronic Care Management services today including:  1. CCM service includes personalized support from designated clinical staff supervised by his physician, including individualized plan of care and coordination with other care providers 2. 24/7 contact phone numbers for assistance for urgent and routine care needs. 3. Service will only be billed when office clinical staff spend 20 minutes or more in a month to coordinate care. 4. Only one practitioner may furnish and bill the service in a calendar month. 5. The patient may stop CCM services at any time (effective at the end of the month) by phone call to the office staff.   Patient agreed to services and verbal consent obtained.   Follow up plan:   Carley Perdue [employer] Scheduler

## 2020-05-31 ENCOUNTER — Telehealth: Payer: Medicare HMO

## 2020-05-31 DIAGNOSIS — E1122 Type 2 diabetes mellitus with diabetic chronic kidney disease: Secondary | ICD-10-CM | POA: Diagnosis not present

## 2020-05-31 DIAGNOSIS — N185 Chronic kidney disease, stage 5: Secondary | ICD-10-CM | POA: Diagnosis not present

## 2020-05-31 DIAGNOSIS — Z794 Long term (current) use of insulin: Secondary | ICD-10-CM | POA: Diagnosis not present

## 2020-05-31 DIAGNOSIS — T380X5A Adverse effect of glucocorticoids and synthetic analogues, initial encounter: Secondary | ICD-10-CM | POA: Diagnosis not present

## 2020-05-31 DIAGNOSIS — R739 Hyperglycemia, unspecified: Secondary | ICD-10-CM | POA: Diagnosis not present

## 2020-05-31 DIAGNOSIS — Z940 Kidney transplant status: Secondary | ICD-10-CM | POA: Diagnosis not present

## 2020-06-02 DIAGNOSIS — Z940 Kidney transplant status: Secondary | ICD-10-CM | POA: Diagnosis not present

## 2020-06-02 DIAGNOSIS — D849 Immunodeficiency, unspecified: Secondary | ICD-10-CM | POA: Diagnosis not present

## 2020-06-02 DIAGNOSIS — I110 Hypertensive heart disease with heart failure: Secondary | ICD-10-CM | POA: Diagnosis not present

## 2020-06-02 DIAGNOSIS — E785 Hyperlipidemia, unspecified: Secondary | ICD-10-CM | POA: Diagnosis not present

## 2020-06-02 DIAGNOSIS — K219 Gastro-esophageal reflux disease without esophagitis: Secondary | ICD-10-CM | POA: Diagnosis not present

## 2020-06-02 DIAGNOSIS — Z792 Long term (current) use of antibiotics: Secondary | ICD-10-CM | POA: Diagnosis not present

## 2020-06-02 DIAGNOSIS — T8619 Other complication of kidney transplant: Secondary | ICD-10-CM | POA: Diagnosis not present

## 2020-06-02 DIAGNOSIS — I5042 Chronic combined systolic (congestive) and diastolic (congestive) heart failure: Secondary | ICD-10-CM | POA: Diagnosis not present

## 2020-06-02 DIAGNOSIS — Z949 Transplanted organ and tissue status, unspecified: Secondary | ICD-10-CM | POA: Diagnosis not present

## 2020-06-02 DIAGNOSIS — Z79899 Other long term (current) drug therapy: Secondary | ICD-10-CM | POA: Diagnosis not present

## 2020-06-02 DIAGNOSIS — Z794 Long term (current) use of insulin: Secondary | ICD-10-CM | POA: Diagnosis not present

## 2020-06-02 DIAGNOSIS — E0821 Diabetes mellitus due to underlying condition with diabetic nephropathy: Secondary | ICD-10-CM | POA: Diagnosis not present

## 2020-06-02 DIAGNOSIS — Z87820 Personal history of traumatic brain injury: Secondary | ICD-10-CM | POA: Diagnosis not present

## 2020-06-08 DIAGNOSIS — D849 Immunodeficiency, unspecified: Secondary | ICD-10-CM | POA: Diagnosis not present

## 2020-06-08 DIAGNOSIS — Z940 Kidney transplant status: Secondary | ICD-10-CM | POA: Diagnosis not present

## 2020-06-16 ENCOUNTER — Ambulatory Visit (INDEPENDENT_AMBULATORY_CARE_PROVIDER_SITE_OTHER): Payer: Medicare HMO | Admitting: Nurse Practitioner

## 2020-06-16 ENCOUNTER — Encounter (INDEPENDENT_AMBULATORY_CARE_PROVIDER_SITE_OTHER): Payer: Medicare HMO

## 2020-06-20 DIAGNOSIS — Z949 Transplanted organ and tissue status, unspecified: Secondary | ICD-10-CM | POA: Diagnosis not present

## 2020-06-20 DIAGNOSIS — T8619 Other complication of kidney transplant: Secondary | ICD-10-CM | POA: Diagnosis not present

## 2020-06-20 DIAGNOSIS — S069X0A Unspecified intracranial injury without loss of consciousness, initial encounter: Secondary | ICD-10-CM | POA: Diagnosis not present

## 2020-06-20 DIAGNOSIS — F39 Unspecified mood [affective] disorder: Secondary | ICD-10-CM | POA: Diagnosis not present

## 2020-06-20 DIAGNOSIS — Z5181 Encounter for therapeutic drug level monitoring: Secondary | ICD-10-CM | POA: Diagnosis not present

## 2020-06-20 DIAGNOSIS — D849 Immunodeficiency, unspecified: Secondary | ICD-10-CM | POA: Diagnosis not present

## 2020-06-20 DIAGNOSIS — Z48298 Encounter for aftercare following other organ transplant: Secondary | ICD-10-CM | POA: Diagnosis not present

## 2020-06-20 DIAGNOSIS — Z298 Encounter for other specified prophylactic measures: Secondary | ICD-10-CM | POA: Diagnosis not present

## 2020-06-20 DIAGNOSIS — Z4822 Encounter for aftercare following kidney transplant: Secondary | ICD-10-CM | POA: Diagnosis not present

## 2020-06-20 DIAGNOSIS — B1920 Unspecified viral hepatitis C without hepatic coma: Secondary | ICD-10-CM | POA: Diagnosis not present

## 2020-06-20 DIAGNOSIS — Z114 Encounter for screening for human immunodeficiency virus [HIV]: Secondary | ICD-10-CM | POA: Diagnosis not present

## 2020-06-20 DIAGNOSIS — Z940 Kidney transplant status: Secondary | ICD-10-CM | POA: Diagnosis not present

## 2020-06-20 DIAGNOSIS — I10 Essential (primary) hypertension: Secondary | ICD-10-CM | POA: Diagnosis not present

## 2020-06-20 DIAGNOSIS — E119 Type 2 diabetes mellitus without complications: Secondary | ICD-10-CM | POA: Diagnosis not present

## 2020-06-20 DIAGNOSIS — Z1159 Encounter for screening for other viral diseases: Secondary | ICD-10-CM | POA: Diagnosis not present

## 2020-06-23 ENCOUNTER — Other Ambulatory Visit: Payer: Self-pay | Admitting: Internal Medicine

## 2020-06-23 DIAGNOSIS — Z949 Transplanted organ and tissue status, unspecified: Secondary | ICD-10-CM | POA: Diagnosis not present

## 2020-06-23 DIAGNOSIS — Z1159 Encounter for screening for other viral diseases: Secondary | ICD-10-CM | POA: Diagnosis not present

## 2020-06-23 DIAGNOSIS — T8619 Other complication of kidney transplant: Secondary | ICD-10-CM | POA: Diagnosis not present

## 2020-06-23 DIAGNOSIS — Z114 Encounter for screening for human immunodeficiency virus [HIV]: Secondary | ICD-10-CM | POA: Diagnosis not present

## 2020-06-28 DIAGNOSIS — Z940 Kidney transplant status: Secondary | ICD-10-CM | POA: Diagnosis not present

## 2020-06-28 DIAGNOSIS — D849 Immunodeficiency, unspecified: Secondary | ICD-10-CM | POA: Diagnosis not present

## 2020-06-29 ENCOUNTER — Other Ambulatory Visit: Payer: Self-pay | Admitting: Urology

## 2020-06-29 DIAGNOSIS — N401 Enlarged prostate with lower urinary tract symptoms: Secondary | ICD-10-CM

## 2020-06-30 ENCOUNTER — Other Ambulatory Visit: Payer: Self-pay

## 2020-06-30 MED ORDER — ACCU-CHEK SOFTCLIX LANCET DEV KIT: 1.0000 | PACK | Freq: Once | 0 refills | Status: AC

## 2020-06-30 MED ORDER — ACCU-CHEK GUIDE W/DEVICE KIT: 1.0000 | PACK | Freq: Once | 0 refills | Status: AC

## 2020-06-30 MED ORDER — ACCU-CHEK SOFTCLIX LANCETS MISC: 4 refills | Status: DC

## 2020-06-30 MED ORDER — ACCU-CHEK GUIDE VI STRP: ORAL_STRIP | 4 refills | Status: AC

## 2020-07-04 DIAGNOSIS — Z949 Transplanted organ and tissue status, unspecified: Secondary | ICD-10-CM | POA: Diagnosis not present

## 2020-07-04 DIAGNOSIS — Z940 Kidney transplant status: Secondary | ICD-10-CM | POA: Diagnosis not present

## 2020-07-04 DIAGNOSIS — Z298 Encounter for other specified prophylactic measures: Secondary | ICD-10-CM | POA: Diagnosis not present

## 2020-07-04 DIAGNOSIS — I10 Essential (primary) hypertension: Secondary | ICD-10-CM | POA: Diagnosis not present

## 2020-07-04 DIAGNOSIS — D849 Immunodeficiency, unspecified: Secondary | ICD-10-CM | POA: Diagnosis not present

## 2020-07-05 DIAGNOSIS — E1122 Type 2 diabetes mellitus with diabetic chronic kidney disease: Secondary | ICD-10-CM | POA: Diagnosis not present

## 2020-07-05 DIAGNOSIS — N185 Chronic kidney disease, stage 5: Secondary | ICD-10-CM | POA: Diagnosis not present

## 2020-07-05 DIAGNOSIS — Z794 Long term (current) use of insulin: Secondary | ICD-10-CM | POA: Diagnosis not present

## 2020-07-06 ENCOUNTER — Other Ambulatory Visit: Payer: Self-pay | Admitting: Internal Medicine

## 2020-07-12 DIAGNOSIS — Z940 Kidney transplant status: Secondary | ICD-10-CM | POA: Diagnosis not present

## 2020-07-12 DIAGNOSIS — D849 Immunodeficiency, unspecified: Secondary | ICD-10-CM | POA: Diagnosis not present

## 2020-07-21 DIAGNOSIS — D849 Immunodeficiency, unspecified: Secondary | ICD-10-CM | POA: Diagnosis not present

## 2020-07-21 DIAGNOSIS — Z940 Kidney transplant status: Secondary | ICD-10-CM | POA: Diagnosis not present

## 2020-08-01 DIAGNOSIS — I5032 Chronic diastolic (congestive) heart failure: Secondary | ICD-10-CM | POA: Diagnosis not present

## 2020-08-01 DIAGNOSIS — R42 Dizziness and giddiness: Secondary | ICD-10-CM | POA: Diagnosis not present

## 2020-08-01 DIAGNOSIS — I10 Essential (primary) hypertension: Secondary | ICD-10-CM | POA: Diagnosis not present

## 2020-08-03 DIAGNOSIS — Z940 Kidney transplant status: Secondary | ICD-10-CM | POA: Diagnosis not present

## 2020-08-03 DIAGNOSIS — D849 Immunodeficiency, unspecified: Secondary | ICD-10-CM | POA: Diagnosis not present

## 2020-08-07 IMAGING — CT CT ABD-PELV W/O CM
2 of 4 series · 15 of 46 positions shown, 17 images · non-contrast
Comparison: 07/26/2017

CLINICAL DATA: Bilateral lower abdominal pain, back pain, dialysis
patient

EXAM:
CT ABDOMEN AND PELVIS WITHOUT CONTRAST
TECHNIQUE: Multidetector CT imaging of the abdomen and pelvis was performed
following the standard protocol without IV contrast.

[Series 2: routine abd/pel wo · axial · 1.27mm/px · z∈[-800,-280]mm · 12 of 114 slices shown, 14 images]
[im 5/114  soft-tissue]
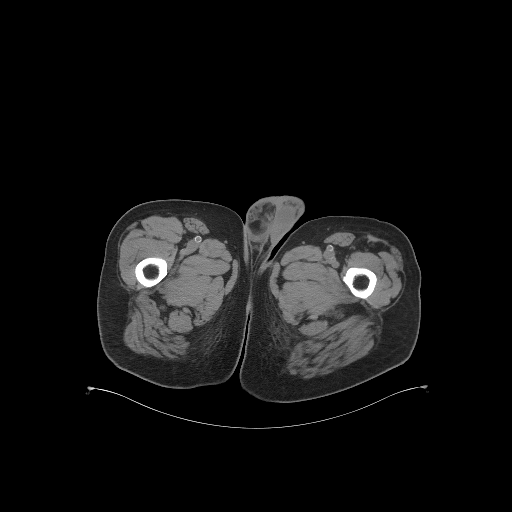
[im 5/114  bone]
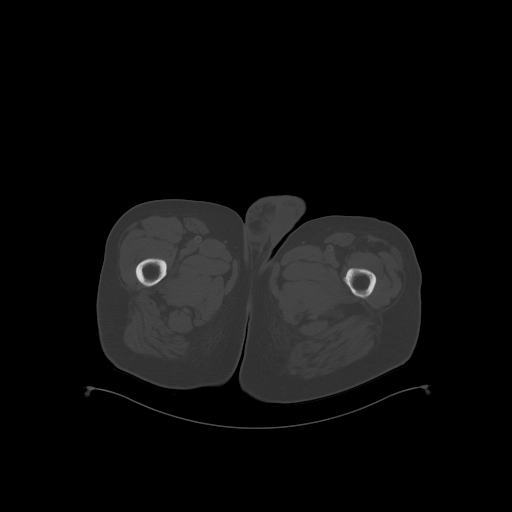
[im 15/114  soft-tissue]
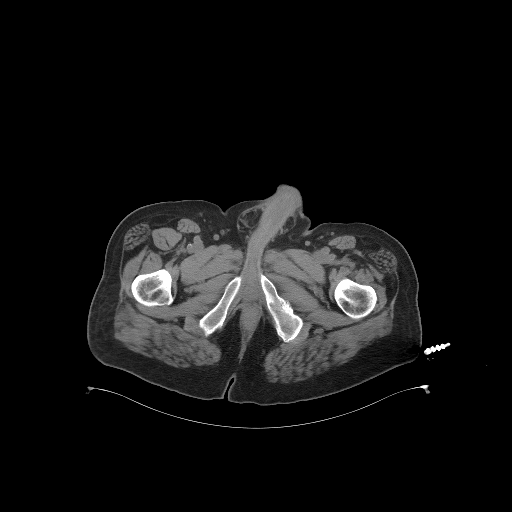
[im 25/114  soft-tissue]
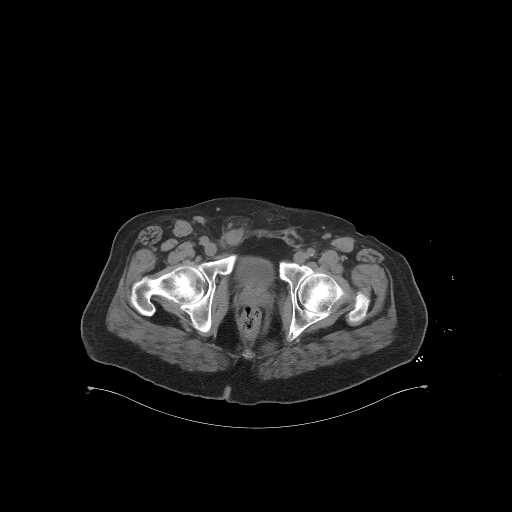
[im 35/114  soft-tissue]
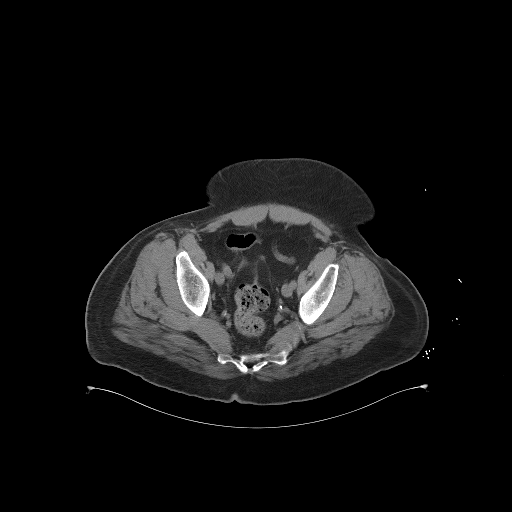
[im 45/114  soft-tissue]
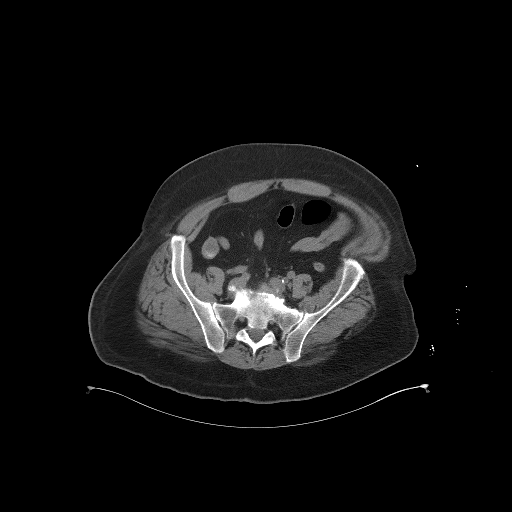
[im 55/114  soft-tissue]
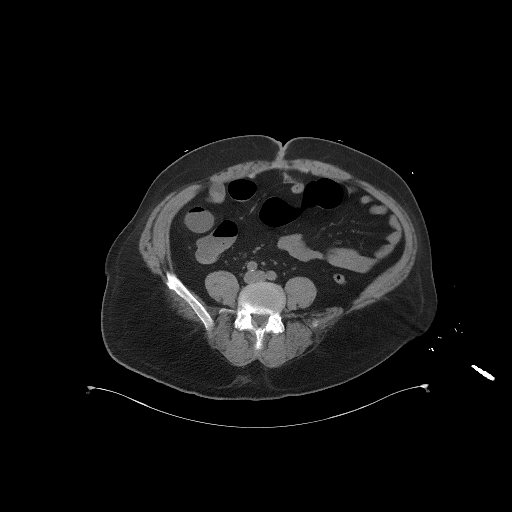
[im 59/114  soft-tissue]
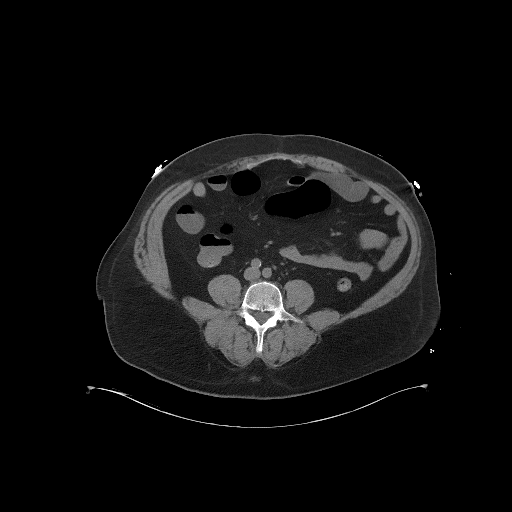
[im 69/114  soft-tissue]
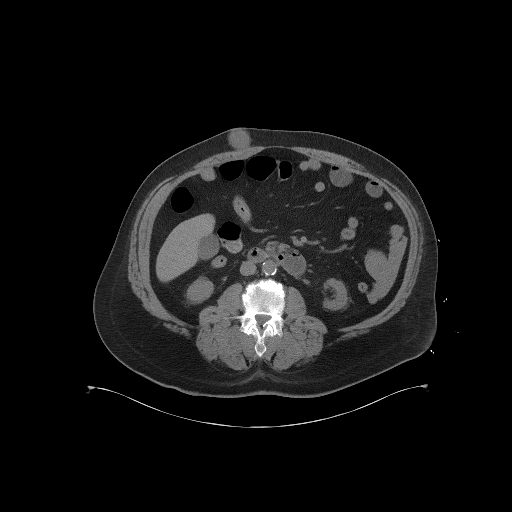
[im 79/114  soft-tissue]
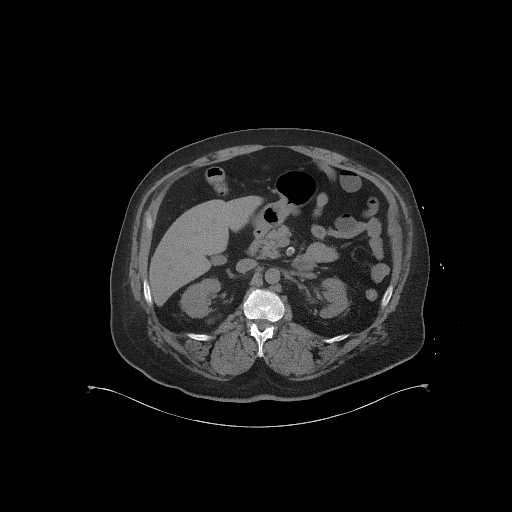
[im 79/114  bone]
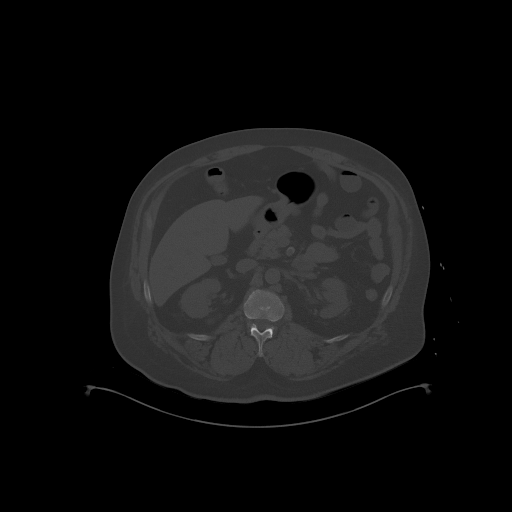
[im 89/114  soft-tissue]
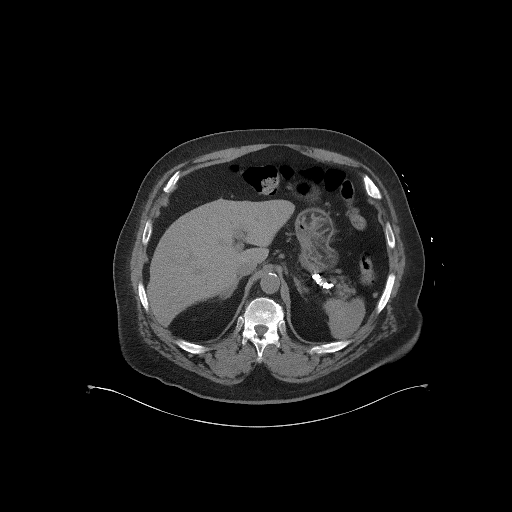
[im 99/114  soft-tissue]
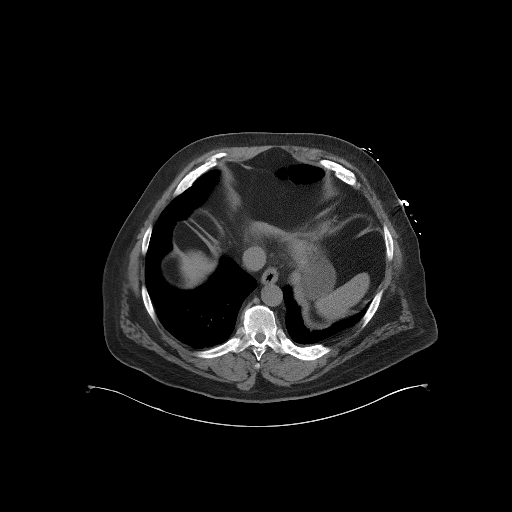
[im 109/114  soft-tissue]
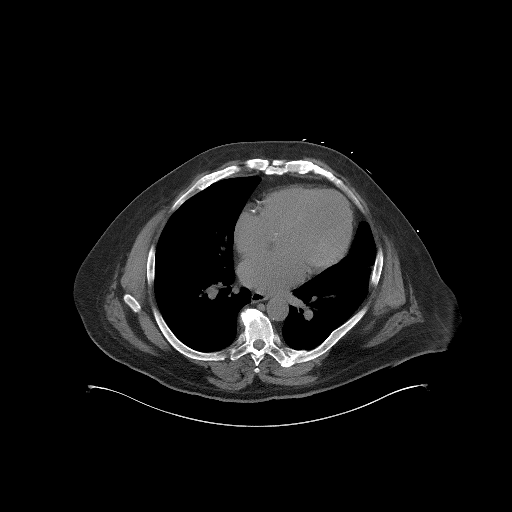

[Series 5: coronal st · coronal · 0.88mm/px · 3 of 117 slices shown]
[im 39/117  soft-tissue]
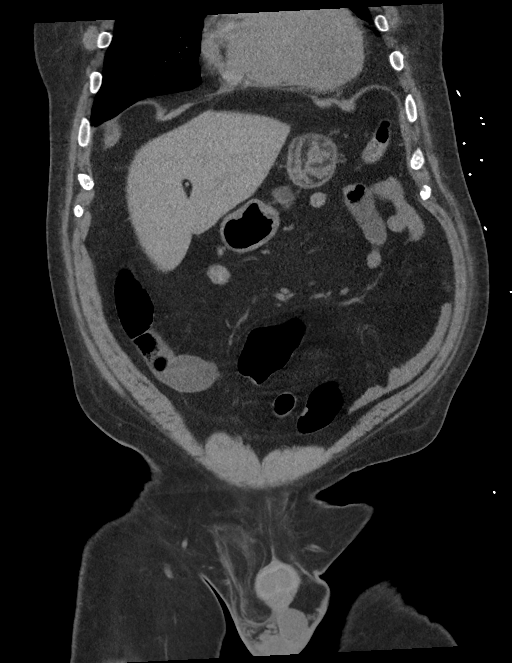
[im 52/117  soft-tissue]
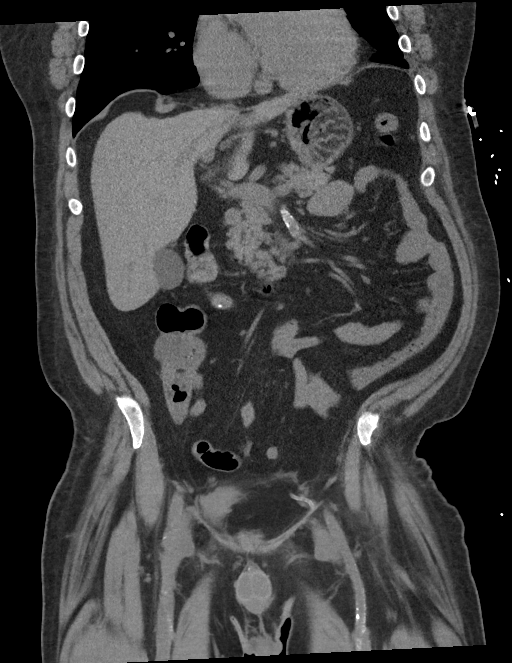
[im 65/117  soft-tissue]
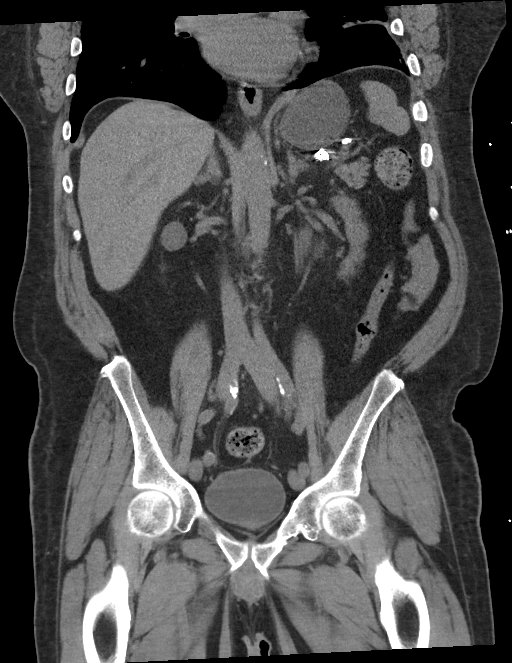

[15 of 46 positions shown; findings below may reference images not displayed]

FINDINGS: Lower chest: Minor basilar atelectasis versus scarring. Several
remote healed left inferior rib fractures with chest wall deformity.
Normal heart size. Native coronary atherosclerosis. No pericardial
or pleural effusion. Degenerative changes of the thoracic spine.

Hepatobiliary: No focal abnormality or biliary dilatation within the
limits of noncontrast imaging. Gallbladder unremarkable. Common bile
duct nondilated. No surrounding free fluid or ascites.

Pancreas: Unremarkable. No pancreatic ductal dilatation or
surrounding inflammatory changes.

Spleen: Stable irregular contour of the spleen. Remote splenic
artery embolization noted. No other acute finding.

Adrenals/Urinary Tract: Normal adrenal glands. Kidneys demonstrate
stable probable renal cysts bilaterally. Minor perinephric strandy
edema as before. No renal obstruction or definite hydronephrosis.
Left extrarenal pelvis noted. No hydroureter or ureteral calculus.
Ureters are symmetric and decompressed.

Bladder is underdistended. A portion of the right bladder dome
appears to extend in to the chronic right inguinal hernia.

Stomach/Bowel: Negative for bowel obstruction, significant
dilatation, ileus, or free air. Slight prominence of the appendix
but no surrounding inflammatory change or enhancement. Cecum and
terminal ileum also unremarkable.

No free fluid, fluid collection, hemorrhage, hematoma, or abscess.

Vascular/Lymphatic: Aortoiliac atherosclerosis noted. No aneurysm or
retroperitoneal hemorrhage. No bulky adenopathy. Iliac
atherosclerosis noted. Limited without IV contrast.

Reproductive: Prostate normal in size.  Seminal vesicles symmetric.

Other: Postop changes from ventral hernia repair. Radiopaque mesh
noted at the umbilical level. No recurrent hernia. Peritoneal
dialysis catheter has been removed right abdomen.

Right mid abdominal wall subcutaneous 3.7 cm superficial cystic
structure is smaller and may represent residual changes from a
chronic lymphocele, hematoma, or sebaceous cyst. No surrounding
inflammatory change.

Musculoskeletal: Degenerative changes noted of the spine. No acute
osseous finding.
IMPRESSION: Right inguinal hernia which now contains a herniated portion of the
right bladder dome and adjacent perivesical fat.

Smaller right abdominal wall subcutaneous complex cystic structure
remains indeterminate. See above comment regarding lymphocele,
hematoma or chronic sebaceous cyst.

Other incidental findings as above

Aortic Atherosclerosis ([id number]).

## 2020-08-15 DIAGNOSIS — D849 Immunodeficiency, unspecified: Secondary | ICD-10-CM | POA: Diagnosis not present

## 2020-08-15 DIAGNOSIS — T8619 Other complication of kidney transplant: Secondary | ICD-10-CM | POA: Diagnosis not present

## 2020-08-15 DIAGNOSIS — Z940 Kidney transplant status: Secondary | ICD-10-CM | POA: Diagnosis not present

## 2020-08-23 ENCOUNTER — Ambulatory Visit: Payer: Self-pay | Admitting: Urology

## 2020-08-24 ENCOUNTER — Other Ambulatory Visit: Payer: Self-pay | Admitting: Internal Medicine
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
## 2020-08-25 ENCOUNTER — Ambulatory Visit (INDEPENDENT_AMBULATORY_CARE_PROVIDER_SITE_OTHER): Payer: Medicare HMO | Admitting: Urology

## 2020-08-25 ENCOUNTER — Other Ambulatory Visit: Payer: Self-pay

## 2020-08-25 ENCOUNTER — Encounter: Payer: Self-pay | Admitting: Urology

## 2020-08-25 VITALS — BP 137/66 | HR 56 | Wt 235.9 lb

## 2020-08-25 DIAGNOSIS — N401 Enlarged prostate with lower urinary tract symptoms: Secondary | ICD-10-CM | POA: Diagnosis not present

## 2020-08-25 DIAGNOSIS — N186 End stage renal disease: Secondary | ICD-10-CM

## 2020-08-25 DIAGNOSIS — R319 Hematuria, unspecified: Secondary | ICD-10-CM | POA: Diagnosis not present

## 2020-08-25 DIAGNOSIS — Z992 Dependence on renal dialysis: Secondary | ICD-10-CM

## 2020-08-25 LAB — BLADDER SCAN AMB NON-IMAGING: Scan Result: 2

## 2020-08-26 LAB — MICROSCOPIC EXAMINATION: Bacteria, UA: NONE SEEN

## 2020-08-26 LAB — URINALYSIS, COMPLETE
Bilirubin, UA: NEGATIVE
Glucose, UA: NEGATIVE
Ketones, UA: NEGATIVE
Nitrite, UA: NEGATIVE
Protein,UA: NEGATIVE
RBC, UA: NEGATIVE
Specific Gravity, UA: 1.02 (ref 1.005–1.030)
Urobilinogen, Ur: 0.2 mg/dL (ref 0.2–1.0)
pH, UA: 5 (ref 5.0–7.5)

## 2020-08-26 LAB — PSA: Prostate Specific Ag, Serum: 0.7 ng/mL (ref 0.0–4.0)

## 2020-08-30 DIAGNOSIS — N185 Chronic kidney disease, stage 5: Secondary | ICD-10-CM | POA: Diagnosis not present

## 2020-08-30 DIAGNOSIS — E1122 Type 2 diabetes mellitus with diabetic chronic kidney disease: Secondary | ICD-10-CM | POA: Diagnosis not present

## 2020-08-30 DIAGNOSIS — Z940 Kidney transplant status: Secondary | ICD-10-CM | POA: Diagnosis not present

## 2020-08-30 DIAGNOSIS — Z794 Long term (current) use of insulin: Secondary | ICD-10-CM | POA: Diagnosis not present

## 2020-08-31 ENCOUNTER — Other Ambulatory Visit: Payer: Self-pay

## 2020-08-31 ENCOUNTER — Ambulatory Visit: Payer: Medicare HMO

## 2020-08-31 DIAGNOSIS — E785 Hyperlipidemia, unspecified: Secondary | ICD-10-CM

## 2020-08-31 DIAGNOSIS — E1121 Type 2 diabetes mellitus with diabetic nephropathy: Secondary | ICD-10-CM

## 2020-08-31 NOTE — Chronic Care Management (AMB) (Signed)
Chronic Care Management Pharmacy  Name: Richard Chen  MRN: [id number] DOB: Mar 04, 1956  Chief Complaint/ HPI  Richard Chen,  64 y.o., male presents for their Initial CCM visit with the clinical pharmacist via telephone.  PCP : Venia Carbon, MD  Their chronic conditions include: HTN, CHF (diastolic), asthma, type 2 diabetes with renal manifestations, ESRD s/p kidney transplant, HLD, gout, anemia, ED, mood disorder  Office Visits:  10/13/19: [place] - HLD on statin; BMI 35 discussed healthy eating, exercise; fixed cognitive deficits due to major head injury; chronic dysthymia on citalopram; DM well controlled; CHF stable; HTN reasonable control  Consult Visit:  08/30/20: Endocrinology: DM - Stop40 Novolog, Start Glipizide 5 mg with breakfast. Send me glucose data in 2 weeks.  08/25/20: Urology: Hematuria - UA negative today, rtc 1 year; urinary retention/BPH - continue tamsulosin 0.4 mg daily; ESRD - kidney transplant July 2021  08/09/20: Transplant medication call - stop acyclovir Hep C PCR negative 07/04/20  08/01/20: Cardiology - CHF, follow up post fluid overload April 2021 LVEF 35-40%; patient reports feeling well, denies edema, reports feeling winded at times. Weight down 10 lbs since previous office visit; HTN controlled on carvedilol, hydralazine, amlodipine, continue current medications, RTC 6 months  CCM consent: 03/17/20  Medications: Outpatient Encounter Medications as of 08/31/2020  Medication Sig   Accu-Chek Softclix Lancets lancets Use to obtain blood sugar dx code E11.29   acetaminophen (TYLENOL) 500 MG tablet Take 500 mg by mouth every 6 (six) hours as needed (pain).    Alcohol Swabs (ALCOHOL PREP) PADS 1 Units by Does not apply route daily.   allopurinol (ZYLOPRIM) 300 MG tablet TAKE 1 TABLET EVERY DAY (Patient taking differently: Take 100 mg by mouth daily. )   amLODipine (NORVASC) 10 MG tablet Take 1 tablet (10 mg total) by mouth daily.   Blood  Glucose Monitoring Suppl (ACCU-CHEK GUIDE) w/Device KIT USE AS DIRECTED   calcitRIOL (ROCALTROL) 0.25 MCG capsule    citalopram (CELEXA) 20 MG tablet TAKE 1 TABLET EVERY DAY   EPCLUSA 400-100 MG TABS daily.   fexofenadine (ALLEGRA) 180 MG tablet Take 180 mg daily by mouth.   fluticasone (FLONASE) 50 MCG/ACT nasal spray USE 2 SPRAYS IN EACH NOSTRIL EVERY DAY   glipiZIDE (GLUCOTROL) 5 MG tablet Take by mouth.   glucose blood (ACCU-CHEK GUIDE) test strip Use to check blood sugar once a day Dx Code E11.29   hydrALAZINE (APRESOLINE) 50 MG tablet Take 1 tablet (50 mg total) by mouth every 8 (eight) hours.   predniSONE (DELTASONE) 5 MG tablet 5 mg daily.   senna-docusate (SENOKOT-S) 8.6-50 MG tablet Take by mouth 2 (two) times daily as needed.   sulfamethoxazole-trimethoprim (BACTRIM DS) 800-160 MG tablet 1 tablet Monday, Wednesday, Friday only   tacrolimus (PROGRAF) 1 MG capsule 6 tablets qAM and 5 tablets qPM   tamsulosin (FLOMAX) 0.4 MG CAPS capsule TAKE 1 CAPSULE (0.4 MG TOTAL) BY MOUTH DAILY.   carvedilol (COREG) 25 MG tablet    docusate sodium (COLACE) 100 MG capsule Take 100 mg by mouth 2 (two) times daily. Dok Plus BID (Patient not taking: Reported on 08/31/2020)   famotidine (PEPCID) 20 MG tablet  (Patient not taking: Reported on 08/31/2020)   hydrALAZINE (APRESOLINE) 100 MG tablet  (Patient not taking: Reported on 08/31/2020)   lactulose (CHRONULAC) 10 GM/15ML solution Take 15 mLs daily as needed by mouth for mild constipation.  (Patient not taking: Reported on 08/31/2020)   lidocaine (LIDODERM) 5 % Place 1 patch  onto the skin every 12 (twelve) hours. Remove & Discard patch within 12 hours or as directed by MD (Patient not taking: Reported on 08/31/2020)   lidocaine-prilocaine (EMLA) cream Apply 1 application topically daily as needed (prior to port access for dialysis). (Patient not taking: Reported on 08/31/2020)   mycophenolate (CELLCEPT) 250 MG capsule Take by mouth.  (Patient not taking: Reported on 08/31/2020)   NOVOLOG FLEXPEN 100 UNIT/ML FlexPen  (Patient not taking: Reported on 08/31/2020)   No facility-administered encounter medications on file as of 08/31/2020.    Current Diagnosis/Assessment:  SDOH Interventions     Most Recent Value  SDOH Interventions  Financial Strain Interventions Intervention Not Indicated     Goals Addressed            This Visit's Progress    Pharmacy Care Plan       CARE PLAN ENTRY (see longitudinal plan of care for additional care plan information)  Current Barriers:   Chronic Disease Management support, education, and care coordination needs related to Hyperlipidemia and Diabetes   Hyperlipidemia Lab Results  Component Value Date/Time   LDLCALC 65 10/13/2019 10:00 AM   LDLDIRECT 166.5 10/26/2013 03:31 PM   Pharmacist Clinical Goal(s): o Over the next 6 months, patient will work with PharmD and providers to maintain LDL goal < 70  Current regimen:  o Atorvastatin 20 mg - 1 tablet daily  Interventions: o Reviewed labs o Recommend daily exercise with goal of 150 minutes per week  Patient self care activities - Over the next 6 months, patient will:  Slowly increase exercise with goal of 30 minutes, 5 days per week  Incorporate a healthy diet high in vegetables, fruits and whole grains with low-fat dairy products, chicken, fish, legumes, non-tropical vegetable oils and nuts. Limit intake of sweets, sugar-sweetened beverages and red meats.  Diabetes  Pharmacist Clinical Goal(s): o Over the next 6 months, patient will work with PharmD and providers to achieve fasting blood glucose goal of 80-130 and 2 hour post-prandial goal < 180  Current regimen:  o Glipizide 5 mg - 1 tablet daily  Interventions: o Reviewed home blood glucose log o Provided telephone contact information for concerns   Patient self care activities - Over the next 6 months, patient will: o Check blood sugar 3-4 times  daily, document, and provide at future appointments o Contact provider with any episodes of hypoglycemia or hyperglycemia   Initial goal documentation      Diabetes   Followed by endocrinology Ronny Flurry) Diagnosed 2010, steroid induced - currently on prednisone 5 mg qAM (tapered since July 2021) Recent Relevant Labs: Lab Results  Component Value Date/Time   HGBA1C 6.2 (H) 02/07/2020 07:23 AM   HGBA1C 6.6 (H) 10/13/2019 10:00 AM   MICROALBUR 37.6 (H) 10/09/2011 10:18 AM   MICROALBUR 8.1 (H) 06/28/2009 09:24 AM  Multiple med changes since above A1c - likely no longer accurate   A1c goal < 7% Fasting BG goal < 130 Post-prandial goal  < 180  Checking BG: four times each day Recent FBG Readings:152, 139, 136, 139 Recent 2hr PP BG readings: lunch --> 198, 196, 258 dinner --> 161, 192, 220  Recent HS BG readings: 96, 131, 144 Patient has failed these meds in past: none reported Patient is currently uncontrolled on the following medications:   Glipizide 5 mg - 1 tablet before breakfast Novolog - 8 units breakfast, 9 units lunch, 6 units dinner--> stopping tomoroow  Lab Results  Component Value Date/Time   HMDIABEYEEXA  No Retinopathy 05/10/2020 12:00 AM    Lab Results  Component Value Date/Time   HMDIABFOOTEX done 10/13/2019 12:00 AM    We discussed: per endocrinology instruction, pt reports he will be switching from insulin TID to glipizide tomorrow and plans to call endo with readings in 2 weeks  Plan: Continue current medications   Hypertension   Managed by nephrology   CMP Latest Ref Rng & Units 05/09/2020 02/12/2020 02/11/2020  Glucose 70 - 99 mg/dL 151(H) 165(H) 175(H)  BUN 8 - 23 mg/dL 37(H) 86(H) 66(H)  Creatinine 0.61 - 1.24 mg/dL 6.66(H) 10.07(H) 8.47(H)  Sodium 135 - 145 mmol/L 136 136 136  Potassium 3.5 - 5.1 mmol/L 3.7 4.7 5.1  Chloride 98 - 111 mmol/L 96(L) 96(L) 98  CO2 22 - 32 mmol/L _0 Calcium 8.9 - 10.3 mg/dL 8.1(L) 8.4(L) 8.4(L)  Total Protein 6.5  - 8.1 g/dL 7.9 - -  Total Bilirubin 0.3 - 1.2 mg/dL 0.7 - -  Alkaline Phos 38 - 126 U/L 74 - -  AST 15 - 41 U/L 15 - -  ALT 0 - 44 U/L 14 - -   Office blood pressures are: BP Readings from Last 3 Encounters:  08/25/20 137/66  05/09/20 (!) 164/63  03/17/20 (!) 162/64   Patient has failed these meds in the past: none reported Patient checks BP at home three times daily  Patient home BP readings are ranging: 150-160/80 in the morning and afternoon, 130/80 at bedtime  Patient is currently uncontrolled on the following medications:   Carvedilol 12.5 mg - 1/2 BID  Amlodipine 10 mg - 1 tablet daily (9 AM)  Hydralazine 50 mg - 1 tablet TID (9AM, 1PM, 9AM)  We discussed: continue to report home BP readings to nephrology  Plan: Continue current medications; Closely follow with nephrology.  Heart Failure   Followed by Cardiology, Clabe Seal, PA  Type: Diastolic and Systolic   Last ejection fraction: 35-40% 01/2020 NYHA Class: II (slight limitation of activity) AHA HF Stage: C (Heart disease and symptoms present)  Patient has failed these meds in past: losartan, metoprolol Patient is currently controlled on the following medications:   Carvedilol 12.5 mg - 1/2 BID  We discussed: denies any symptoms of fluid overload   Plan: Continue current medications   Hyperlipidemia   LDL goal < 70 (aortic atherosclerosis)  Last lipids Lab Results  Component Value Date   CHOL 133 10/13/2019   HDL 42.90 10/13/2019   LDLCALC 65 10/13/2019   LDLDIRECT 166.5 10/26/2013   TRIG 125.0 10/13/2019   CHOLHDL 3 10/13/2019   Hepatic Function Latest Ref Rng & Units 05/09/2020 02/07/2020 10/13/2019  Total Protein 6.5 - 8.1 g/dL 7.9 7.6 6.8  Albumin 3.5 - 5.0 g/dL 4.1 3.7 3.7  AST 15 - 41 U/L _1 ALT 0 - 44 U/L _2 Alk Phosphatase 38 - 126 U/L 74 76 72  Total Bilirubin 0.3 - 1.2 mg/dL 0.7 0.8 0.3  Bilirubin, Direct 0.0 - 0.3 mg/dL - - 0.1    The 10-year ASCVD risk score Mikey Bussing DC  Jr., et al., 2013) is: 30.7%   Values used to calculate the score:     Age: 21 years     Sex: Male     Is Non-Hispanic African American: Yes     Diabetic: Yes     Tobacco smoker: No     Systolic Blood Pressure: 846 mmHg     Is BP treated: Yes  HDL Cholesterol: 42.9 mg/dL     Total Cholesterol: 133 mg/dL   Patient has failed these meds in past: none reported Patient is currently controlled on the following medications:   Atorvastatin 20 mg - 1 tablet daily (9 PM)  Plan: Continue current medications  Depression   Patient has failed these meds in past: none reported Patient is currently controlled on the following medications:   Citalopram 20 mg - 1 tablet daily (9 AM)  We discussed: denies concerns  Plan: Continue current medications  Gout   Patient has failed these meds in past: none reported Patient is currently controlled on the following medications:   Allopurinol 100 mg - 1 tablet daily (9 AM)  We discussed: denies recent gout flares  Plan: Continue current medications   BPH   Followed by urology PSA  Date Value Ref Range Status  10/01/2018 0.56 0.10 - 4.00 ng/ml Final    Comment:    Test performed using Access Hybritech PSA Assay, a parmagnetic partical, chemiluminecent immunoassay.  03/05/2016 0.70 0.10 - 4.00 ng/mL Final  10/26/2013 0.44 0.10 - 4.00 ng/mL Final   S/p TURP Patient has failed these meds in past: none reported  Patient is currently controlled on the following medications:   Tamsulosin 0.4 mg - 1 capsule daily (9 PM)  We discussed: denies concerns  Plan: Continue current medications   Allergies   Patient has failed these meds in past: none reported Patient is currently controlled on the following medications:   Allegra 180 mg - 1 tab daily (9 AM)  Flonase - 1 spray in each nostril daily  Plan: Continue current medications   S/p Kidney transplant   Followed by [facility] Nephrology Patient has failed these meds in past: none  reported; Acyclovir - stopped 10/28 Patient is currently on the following medications:  Bactrim - 1 tab MWF  Prednisone 5 mg - 1 tablet daily (9 AM)  Mycophenolate 1000 mg - 3 capsules BID   Tacrolimus 1 mg - 6 tablets at 9AM, 5 tablets at 9 PM  Epclusa - 1 tablet daily (9 AM for hep C kidney)  Calcitriol 0.25 mcg - 1 cap daily (9 AM)  We discussed: patient aware of medications, dosing, and timing and closely followed for lab work   Plan: Continue current medications   Medication Management  OTCs: tylenol 650 mg PRN (not using often), dok plus 1 BID, calcium carbonate 2 tabs 3 PM  Pharmacy/Benefits: [facility]  Adherence:  fills pillbox every Saturday with wife based on list, denies missed doses   Affordability: denies concerns   CCM Follow Up: 6 months, telephone (02/24/20 at 9:15 AM)  Debbora Dus, PharmD Clinical Pharmacist [facility] at [facility] [phone]

## 2020-08-31 NOTE — Patient Instructions (Addendum)
August 31, 2020  Dear Richard Chen,  It was a pleasure meeting you during our initial appointment on August 31, 2020. Below is a summary of the goals we discussed and components of chronic care management. Please contact me anytime with questions or concerns.   Visit Information  Goals Addressed            This Visit's Progress   . Pharmacy Care Plan       CARE PLAN ENTRY (see longitudinal plan of care for additional care plan information)  Current Barriers:  . Chronic Disease Management support, education, and care coordination needs related to Hyperlipidemia and Diabetes   Hyperlipidemia Lab Results  Component Value Date/Time   LDLCALC 65 10/13/2019 10:00 AM   LDLDIRECT 166.5 10/26/2013 03:31 PM .  Pharmacist Clinical Goal(s): o Over the next 6 months, patient will work with PharmD and providers to maintain LDL goal < 70 . Current regimen:  o Atorvastatin 20 mg - 1 tablet daily . Interventions: o Reviewed labs o Recommend daily exercise with goal of 150 minutes per week . Patient self care activities - Over the next 6 months, patient will: . Slowly increase exercise with goal of 30 minutes, 5 days per week . Incorporate a healthy diet high in vegetables, fruits and whole grains with low-fat dairy products, chicken, fish, legumes, non-tropical vegetable oils and nuts. Limit intake of sweets, sugar-sweetened beverages and red meats.  Diabetes . Pharmacist Clinical Goal(s): o Over the next 6 months, patient will work with PharmD and providers to achieve fasting blood glucose goal of 80-130 and 2 hour post-prandial goal < 180 . Current regimen:  o Glipizide 5 mg - 1 tablet daily . Interventions: o Reviewed home blood glucose log o Provided telephone contact information for concerns  . Patient self care activities - Over the next 6 months, patient will: o Check blood sugar 3-4 times daily, document, and provide at future appointments o Contact provider with any  episodes of hypoglycemia or hyperglycemia   Initial goal documentation       [name] was given information about Chronic Care Management services today including:  1. CCM service includes personalized support from designated clinical staff supervised by his physician, including individualized plan of care and coordination with other care providers 2. 24/7 contact phone numbers for assistance for urgent and routine care needs. 3. Standard insurance, coinsurance, copays and deductibles apply for chronic care management only during months in which we provide at least 20 minutes of these services. Most insurances cover these services at 100%, however patients may be responsible for any copay, coinsurance and/or deductible if applicable. This service may help you avoid the need for more expensive face-to-face services. 4. Only one practitioner may furnish and bill the service in a calendar month. 5. The patient may stop CCM services at any time (effective at the end of the month) by phone call to the office staff.  Patient agreed to services and verbal consent obtained.   Patient verbalizes understanding of instructions provided today.  Telephone follow up appointment with pharmacy team member scheduled for: 02/24/20 at 9:15 AM  Debbora Dus, PharmD Clinical Pharmacist [facility] at [facility] [phone]   Basics of Medicine Management Taking your medicines correctly is an important part of managing or preventing medical problems. Make sure you know what disease or condition your medicine is treating, and how and when to take it. If you do not take your medicine correctly, it may not work  well and may cause unpleasant side effects, including serious health problems. What should I do when I am taking medicines?   Read all the labels and inserts that come with your medicines. Review the information often.  Talk with your pharmacist if you get a refill and notice a change in  the size, color, or shape of your medicines.  Know the potential side effects for each medicine that you take.  Try to get all your medicines from the same pharmacy. The pharmacist will have all your information and will understand how your medicines will affect each other (interact).  Tell your health care provider about all your medicines, including over-the-counter medicines, vitamins, and herbal or dietary supplements. He or she will make sure that nothing will interact with any of your prescribed medicines. How can I take my medicines safely?  Take medicines only as told by your health care provider. ? Do not take more of your medicine than instructed. ? Do not take anyone else's medicines. ? Do not share your medicines with others. ? Do not stop taking your medicines unless your health care provider tells you to do so. ? You may need to avoid alcohol or certain foods or liquids when taking certain medicines. Follow your health care provider's instructions.  Do not split, mash, or chew your medicines unless your health care provider tells you to do so. Tell your health care provider if you have trouble swallowing your medicines.  For liquid medicine, use the dosing container that was provided. How should I organize my medicines?  Know your medicines  Know what each of your medicines looks like. This includes size, color, and shape. Tell your health care provider if you are having trouble recognizing all the medicines that you are taking.  If you cannot tell your medicines apart because they look similar, keep them in original bottles.  If you cannot read the labels on the bottles, tell your pharmacist to put your medicines in containers with large print.  Review your medicines and your schedule with family members, a friend, or a caregiver. Use a pill organizer  Use a tool to organize your medicine schedule. Tools include a weekly pillbox, a written chart, a notebook, or a  calendar.  Your tool should help you remember the following things about each medicine: ? The name of the medicine. ? The amount (dose) to take. ? The schedule. This is the day and time the medicine should be taken. ? The appearance. This includes color, shape, size, and stamp. ? How to take your medicines. This includes instructions to take them with food, without food, with fluids, or with other medicines.  Create reminders for taking your medicines. Use sticky notes, or alarms on your watch, mobile device, or phone calendar.  You may choose to use a more advanced management system. These systems have storage, alarms, and visual and audio prompts.  Some medicines can be taken on an "as-needed" basis. These include medicines for nausea or pain. If you take an as-needed medicine, write down the name and dose, as well as the date and time that you took it. How should I plan for travel?  Take your pillbox, medicines, and organization system with you when traveling.  Have your medicines refilled before you travel. This will ensure that you do not run out of your medicines while you are away from home.  Always carry an updated list of your medicines with you. If there is an emergency, a first responder  can quickly see what medicines you are taking.  Do not pack your medicines in checked luggage in case your luggage is lost or delayed.  If any of your medicines is considered a controlled substance, make sure you bring a letter from your health care provider with you. How should I store and discard my medicines? For safe storage:  Store medicines in a cool, dry area away from light, or as directed by your health care provider. Do not store medicines in the bathroom. Heat and humidity will affect them.  Do not store your medicines with other chemicals, or with medicines for pets or other household members.  Keep medicines away from children and pets. Do not leave them on counters or bedside  tables. Store them in high cabinets or on high shelves. For safe disposal:  Check expiration dates regularly. Do not take expired medicines. Discard medicines that are older than the expiration date.  Learn a safe way to dispose of your medicines. You may: ? Use a local government, hospital, or pharmacy medicine-take-back program. ? Mix the medicines with inedible substances, put them in a sealed bag or empty container, and throw them in the trash. What should I remember?  Tell your health care provider if you: ? Experience side effects. ? Have new symptoms. ? Have other concerns about taking your medicines.  Review your medicines regularly with your health care provider. Other medicines, diet, medical conditions, weight changes, and daily habits can all affect how medicines work. Ask if you need to continue taking each medicine, and discuss how well each one is working.  Refill your medicines early to avoid running out of them.  In case of an accidental overdose, call your local [place] at [phone] or visit your local emergency department immediately. This is important. Summary  Taking your medicines correctly is an important part of managing or preventing medical problems.  You need to make sure that you understand what you are taking a medicine for, as well as how and when you need to take it.  Know your medicines and use a pill organizer to help you take your medicines correctly.  In case of an accidental overdose, call your local [place] at [phone] or visit your local emergency department immediately. This is important. This information is not intended to replace advice given to you by your health care provider. Make sure you discuss any questions you have with your health care provider. Document Revised: 10/03/2017 Document Reviewed: 10/03/2017 Elsevier Patient Education  2020 [employer].

## 2020-09-01 ENCOUNTER — Ambulatory Visit: Payer: Medicare HMO | Admitting: Urology

## 2020-09-01 DIAGNOSIS — I5022 Chronic systolic (congestive) heart failure: Secondary | ICD-10-CM | POA: Diagnosis not present

## 2020-09-01 DIAGNOSIS — D849 Immunodeficiency, unspecified: Secondary | ICD-10-CM | POA: Diagnosis not present

## 2020-09-01 DIAGNOSIS — T8619 Other complication of kidney transplant: Secondary | ICD-10-CM | POA: Diagnosis not present

## 2020-09-01 DIAGNOSIS — Z5181 Encounter for therapeutic drug level monitoring: Secondary | ICD-10-CM | POA: Diagnosis not present

## 2020-09-01 DIAGNOSIS — N186 End stage renal disease: Secondary | ICD-10-CM | POA: Diagnosis not present

## 2020-09-01 DIAGNOSIS — B1710 Acute hepatitis C without hepatic coma: Secondary | ICD-10-CM | POA: Diagnosis not present

## 2020-09-01 DIAGNOSIS — Z4822 Encounter for aftercare following kidney transplant: Secondary | ICD-10-CM | POA: Diagnosis not present

## 2020-09-01 DIAGNOSIS — E1122 Type 2 diabetes mellitus with diabetic chronic kidney disease: Secondary | ICD-10-CM | POA: Diagnosis not present

## 2020-09-01 DIAGNOSIS — Z23 Encounter for immunization: Secondary | ICD-10-CM | POA: Diagnosis not present

## 2020-09-01 DIAGNOSIS — Z792 Long term (current) use of antibiotics: Secondary | ICD-10-CM | POA: Diagnosis not present

## 2020-09-08 DIAGNOSIS — Z940 Kidney transplant status: Secondary | ICD-10-CM | POA: Diagnosis not present

## 2020-09-08 DIAGNOSIS — Z79899 Other long term (current) drug therapy: Secondary | ICD-10-CM | POA: Diagnosis not present

## 2020-09-19 DIAGNOSIS — Z940 Kidney transplant status: Secondary | ICD-10-CM | POA: Diagnosis not present

## 2020-09-19 DIAGNOSIS — Z5181 Encounter for therapeutic drug level monitoring: Secondary | ICD-10-CM | POA: Diagnosis not present

## 2020-09-21 DIAGNOSIS — H43811 Vitreous degeneration, right eye: Secondary | ICD-10-CM | POA: Diagnosis not present

## 2020-09-28 DIAGNOSIS — Z940 Kidney transplant status: Secondary | ICD-10-CM | POA: Diagnosis not present

## 2020-09-28 DIAGNOSIS — T8619 Other complication of kidney transplant: Secondary | ICD-10-CM | POA: Diagnosis not present

## 2020-09-30 DIAGNOSIS — Z5181 Encounter for therapeutic drug level monitoring: Secondary | ICD-10-CM | POA: Diagnosis not present

## 2020-09-30 DIAGNOSIS — Z79899 Other long term (current) drug therapy: Secondary | ICD-10-CM | POA: Diagnosis not present

## 2020-09-30 DIAGNOSIS — T8619 Other complication of kidney transplant: Secondary | ICD-10-CM | POA: Diagnosis not present

## 2020-10-18 ENCOUNTER — Other Ambulatory Visit: Payer: Self-pay

## 2020-10-18 ENCOUNTER — Encounter: Payer: Self-pay | Admitting: Internal Medicine

## 2020-10-18 ENCOUNTER — Ambulatory Visit (INDEPENDENT_AMBULATORY_CARE_PROVIDER_SITE_OTHER): Payer: Medicare HMO | Admitting: Internal Medicine

## 2020-10-18 VITALS — BP 116/64 | HR 53 | Temp 97.7°F | Ht 72.5 in | Wt 240.0 lb

## 2020-10-18 DIAGNOSIS — E1121 Type 2 diabetes mellitus with diabetic nephropathy: Secondary | ICD-10-CM | POA: Diagnosis not present

## 2020-10-18 DIAGNOSIS — Z940 Kidney transplant status: Secondary | ICD-10-CM | POA: Diagnosis not present

## 2020-10-18 DIAGNOSIS — I10 Essential (primary) hypertension: Secondary | ICD-10-CM

## 2020-10-18 DIAGNOSIS — S069X0S Unspecified intracranial injury without loss of consciousness, sequela: Secondary | ICD-10-CM | POA: Diagnosis not present

## 2020-10-18 DIAGNOSIS — F39 Unspecified mood [affective] disorder: Secondary | ICD-10-CM | POA: Diagnosis not present

## 2020-10-18 DIAGNOSIS — I700 Atherosclerosis of aorta: Secondary | ICD-10-CM

## 2020-10-18 DIAGNOSIS — Z0000 Encounter for general adult medical examination without abnormal findings: Secondary | ICD-10-CM | POA: Diagnosis not present

## 2020-10-18 DIAGNOSIS — N400 Enlarged prostate without lower urinary tract symptoms: Secondary | ICD-10-CM

## 2020-10-18 DIAGNOSIS — S069XAS Unspecified intracranial injury with loss of consciousness status unknown, sequela: Secondary | ICD-10-CM

## 2020-10-18 DIAGNOSIS — I5032 Chronic diastolic (congestive) heart failure: Secondary | ICD-10-CM

## 2020-10-18 LAB — POCT GLYCOSYLATED HEMOGLOBIN (HGB A1C): Hemoglobin A1C: 7.6 % — AB (ref 4.0–5.6)

## 2020-10-18 LAB — HM DIABETES FOOT EXAM
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
## 2020-10-18 NOTE — Assessment & Plan Note (Signed)
BP Readings from Last 3 Encounters:  10/18/20 116/64  08/25/20 137/66  05/09/20 (!) 164/63   Good control On amlodipine, hydralazine and bisoprolol

## 2020-10-18 NOTE — Assessment & Plan Note (Signed)
Maintained on anti rejection meds per [facility]

## 2020-10-18 NOTE — Progress Notes (Signed)
Hearing Screening  ° Method: Audiometry  ° 125Hz 250Hz 500Hz 1000Hz 2000Hz 3000Hz 4000Hz 6000Hz 8000Hz  °Right ear:   20 20 20  20    °Left ear:   20 20 20  20    °Vision Screening Comments: July 2021 ° ° °
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
## 2020-10-18 NOTE — Assessment & Plan Note (Signed)
Controlled with tamsulosin

## 2020-10-18 NOTE — Assessment & Plan Note (Signed)
Compensated since the transplant Hydralazine and carvedilol

## 2020-10-18 NOTE — Assessment & Plan Note (Signed)
Chronic dysthymia Is on the citalopram Fatigue related to this---needs purpose and something to do Counseled about this

## 2020-10-18 NOTE — Assessment & Plan Note (Signed)
I have personally reviewed the Medicare Annual Wellness questionnaire and have noted 1. The patient's medical and social history 2. Their use of alcohol, tobacco or illicit drugs 3. Their current medications and supplements 4. The patient's functional ability including ADL's, fall risks, home safety risks and hearing or visual             impairment. 5. Diet and physical activities 6. Evidence for depression or mood disorders  The patients weight, height, BMI and visual acuity have been recorded in the chart I have made referrals, counseling and provided education to the patient based review of the above and I have provided the pt with a written personalized care plan for preventive services.  I have provided you with a copy of your personalized plan for preventive services. Please take the time to review along with your updated medication list.  Had COVID booster and flu vaccine Discussed exercise Colon due next year Has had recent PSA Td if any injury

## 2020-10-18 NOTE — Assessment & Plan Note (Addendum)
Seems to have acceptable control on just the glipizide Had renal transplant  Lab Results  Component Value Date   HGBA1C 7.6 (A) 10/18/2020   Control has slipped but is still acceptable Work on fitness No med changes

## 2020-10-18 NOTE — Assessment & Plan Note (Signed)
On imaging Is on statin (atorvastatin)

## 2020-10-19 DIAGNOSIS — Z949 Transplanted organ and tissue status, unspecified: Secondary | ICD-10-CM | POA: Diagnosis not present

## 2020-10-20 DIAGNOSIS — R252 Cramp and spasm: Secondary | ICD-10-CM | POA: Diagnosis not present

## 2020-10-20 DIAGNOSIS — Z5181 Encounter for therapeutic drug level monitoring: Secondary | ICD-10-CM | POA: Diagnosis not present

## 2020-10-20 DIAGNOSIS — B1710 Acute hepatitis C without hepatic coma: Secondary | ICD-10-CM | POA: Diagnosis not present

## 2020-10-20 DIAGNOSIS — Z79899 Other long term (current) drug therapy: Secondary | ICD-10-CM | POA: Diagnosis not present

## 2020-10-20 DIAGNOSIS — Z992 Dependence on renal dialysis: Secondary | ICD-10-CM | POA: Diagnosis not present

## 2020-10-20 DIAGNOSIS — Z940 Kidney transplant status: Secondary | ICD-10-CM | POA: Diagnosis not present

## 2020-10-20 DIAGNOSIS — Z4822 Encounter for aftercare following kidney transplant: Secondary | ICD-10-CM | POA: Diagnosis not present

## 2020-11-04 DIAGNOSIS — Z5181 Encounter for therapeutic drug level monitoring: Secondary | ICD-10-CM | POA: Diagnosis not present

## 2020-11-04 DIAGNOSIS — Z79899 Other long term (current) drug therapy: Secondary | ICD-10-CM | POA: Diagnosis not present

## 2020-11-04 DIAGNOSIS — Z4822 Encounter for aftercare following kidney transplant: Secondary | ICD-10-CM | POA: Diagnosis not present

## 2020-11-04 DIAGNOSIS — Z949 Transplanted organ and tissue status, unspecified: Secondary | ICD-10-CM | POA: Diagnosis not present

## 2020-11-14 ENCOUNTER — Telehealth: Payer: Self-pay

## 2020-11-14 NOTE — Chronic Care Management (AMB) (Addendum)
Chronic Care Management Pharmacy Assistant   Name: Richard Chen  MRN: [id number] DOB: Feb 21, 1956  Reason for Encounter: Disease State- General  Patient Questions:  1.  Have you seen any other providers since your last visit? Yes 10/20/20- Richard Glatter, MD, Richard Rodriguez, MD- Nephrology -transplant 10/18/20- Richard Simpler, MD- PCP 09/08/20- Dr. Lissa Chen and Dr. Lynnette Chen- Transplant 09/01/20 Richard Liner, MD- Transplant   2.  Any changes in your medicines or health? Yes 09/09/20- Dr. Ricki Chen- Decreased tacrolimus to 4 mg and added Vitamin D3 2000 units daily    PCP : Richard Carbon, MD  Allergies:   Allergies  Allergen Reactions   Aspirin Anaphylaxis   Shellfish Allergy Anaphylaxis   Clonidine Other (See Comments)    Gait imbalance/ dysfunction requiring wheelchair.  Increased lethargy.    Other Hives    Dye when viewed kidney (break out in knots)   Contrast Media [Iodinated Diagnostic Agents] Rash and Hives    Medications: Outpatient Encounter Medications as of 11/14/2020  Medication Sig   Accu-Chek Softclix Lancets lancets Use to obtain blood sugar dx code E11.29   acetaminophen (TYLENOL) 500 MG tablet Take 500 mg by mouth every 6 (six) hours as needed (pain).    Alcohol Swabs (ALCOHOL PREP) PADS 1 Units by Does not apply route daily.   allopurinol (ZYLOPRIM) 300 MG tablet TAKE 1 TABLET EVERY DAY (Patient taking differently: Take 100 mg by mouth daily.)   amLODipine (NORVASC) 10 MG tablet Take 1 tablet (10 mg total) by mouth daily.   atorvastatin (LIPITOR) 20 MG tablet Take 20 mg by mouth daily.   Blood Glucose Monitoring Suppl (ACCU-CHEK GUIDE) w/Device KIT USE AS DIRECTED   calcitRIOL (ROCALTROL) 0.25 MCG capsule    Calcium Carbonate Antacid (CALCIUM CARBONATE PO) Take 2 tablets by mouth.   carvedilol (COREG) 12.5 MG tablet Take 6.25 mg by mouth 2 (two) times daily with a meal.   Cholecalciferol (VITAMIN D3) 50 MCG (2000 UT) CAPS Take by mouth.    citalopram (CELEXA) 20 MG tablet TAKE 1 TABLET EVERY DAY   docusate sodium (COLACE) 100 MG capsule Take 100 mg by mouth 2 (two) times daily. Dok Plus BID   fexofenadine (ALLEGRA) 180 MG tablet Take 180 mg daily by mouth.   fluticasone (FLONASE) 50 MCG/ACT nasal spray USE 2 SPRAYS IN EACH NOSTRIL EVERY DAY   glipiZIDE (GLUCOTROL) 5 MG tablet Take by mouth.   glucose blood (ACCU-CHEK GUIDE) test strip Use to check blood sugar once a day Dx Code E11.29   hydrALAZINE (APRESOLINE) 50 MG tablet Take 1 tablet (50 mg total) by mouth every 8 (eight) hours.   lactulose (CHRONULAC) 10 GM/15ML solution Take 15 mLs by mouth daily as needed for mild constipation.   mycophenolate (CELLCEPT) 250 MG capsule Take by mouth.   predniSONE (DELTASONE) 5 MG tablet 5 mg daily.   senna-docusate (SENOKOT-S) 8.6-50 MG tablet Take by mouth 2 (two) times daily as needed.   sulfamethoxazole-trimethoprim (BACTRIM DS) 800-160 MG tablet 1 tablet Monday, Wednesday, Friday only   tacrolimus (PROGRAF) 1 MG capsule 6 tablets qAM and 5 tablets qPM   tamsulosin (FLOMAX) 0.4 MG CAPS capsule TAKE 1 CAPSULE (0.4 MG TOTAL) BY MOUTH DAILY.   No facility-administered encounter medications on file as of 11/14/2020.    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 for general adherence call. Since last visit with CPP on 08/31/20, the following interventions have been made: Patient states 11/08/20 Dr. Azzie Chen increased his tacrolimus to 5 mg in the morning and 4 mg in the after noon. 09/09/20- Dr. Ricki Chen- Decreased tacrolimus to 4 mg and added Vitamin D3 2000 units daily.  The patient has not had an ED visit since their last CPP follow up. The patients current Chronic and PDC medications are: Atorvastatin 20 mg takes 1 tablet daily, glipizide 5 mg- take 1 tablet in the morning.  Richard Chen, Pharm. D to review if patient has greater than 5 day gap between last fill.The patient has not had any recent problems with their health. Patient is about 6 months out from his kidney transplant. The patient denies any problems with their pharmacy at this time. The patient denies any side effects with their medicines.The patient has no recommendations for improvements in managing care. Patient denied any concerns at this time.              Follow-Up:  Pharmacist Review   Richard Chen, CPP notified  Richard Chen, [place] Assistant [phone]  Reviewed adherence by refill history. Atorvastatin past due - refilled 06/21/20 90 DS. No history of glipizide refill. Pt reported replacing insulin with glipizide starting Nov 2021 at previous visit. Will have Richard Chen call this month to assess adherence and refill history.  Richard Chen, PharmD Clinical Pharmacist [facility] [phone]

## 2020-11-21 DIAGNOSIS — E785 Hyperlipidemia, unspecified: Secondary | ICD-10-CM | POA: Diagnosis not present

## 2020-11-21 DIAGNOSIS — D84821 Immunodeficiency due to drugs: Secondary | ICD-10-CM | POA: Diagnosis not present

## 2020-11-21 DIAGNOSIS — R5383 Other fatigue: Secondary | ICD-10-CM | POA: Diagnosis not present

## 2020-11-21 DIAGNOSIS — E119 Type 2 diabetes mellitus without complications: Secondary | ICD-10-CM | POA: Diagnosis not present

## 2020-11-21 DIAGNOSIS — D849 Immunodeficiency, unspecified: Secondary | ICD-10-CM | POA: Diagnosis not present

## 2020-11-21 DIAGNOSIS — I10 Essential (primary) hypertension: Secondary | ICD-10-CM | POA: Diagnosis not present

## 2020-11-21 DIAGNOSIS — Z4822 Encounter for aftercare following kidney transplant: Secondary | ICD-10-CM | POA: Diagnosis not present

## 2020-11-21 DIAGNOSIS — M109 Gout, unspecified: Secondary | ICD-10-CM | POA: Diagnosis not present

## 2020-11-21 DIAGNOSIS — T8619 Other complication of kidney transplant: Secondary | ICD-10-CM | POA: Diagnosis not present

## 2020-11-21 DIAGNOSIS — Z940 Kidney transplant status: Secondary | ICD-10-CM | POA: Diagnosis not present

## 2020-11-21 DIAGNOSIS — R0683 Snoring: Secondary | ICD-10-CM | POA: Diagnosis not present

## 2020-11-21 DIAGNOSIS — F39 Unspecified mood [affective] disorder: Secondary | ICD-10-CM | POA: Diagnosis not present

## 2020-11-21 DIAGNOSIS — Z792 Long term (current) use of antibiotics: Secondary | ICD-10-CM | POA: Diagnosis not present

## 2020-11-29 ENCOUNTER — Telehealth: Payer: Self-pay

## 2020-11-29 NOTE — Chronic Care Management (AMB) (Addendum)
Chronic Care Management Pharmacy Assistant   Name: Richard Chen  MRN: [id number] DOB: 07/14/1956  Reason for Encounter: Disease State Review - Diabetes  Patient Question:  1.  Have you seen any other providers since your last visit? Yes 11/21/20- Dr. Azzie Glatter- Transplant team  PCP : Venia Carbon, MD  Allergies:   Allergies  Allergen Reactions   Aspirin Anaphylaxis   Shellfish Allergy Anaphylaxis   Clonidine Other (See Comments)    Gait imbalance/ dysfunction requiring wheelchair.  Increased lethargy.    Other Hives    Dye when viewed kidney (break out in knots)   Contrast Media [Iodinated Diagnostic Agents] Rash and Hives    Medications: Outpatient Encounter Medications as of 11/29/2020  Medication Sig   Accu-Chek Softclix Lancets lancets Use to obtain blood sugar dx code E11.29   acetaminophen (TYLENOL) 500 MG tablet Take 500 mg by mouth every 6 (six) hours as needed (pain).    Alcohol Swabs (ALCOHOL PREP) PADS 1 Units by Does not apply route daily.   allopurinol (ZYLOPRIM) 300 MG tablet TAKE 1 TABLET EVERY DAY (Patient taking differently: Take 100 mg by mouth daily.)   amLODipine (NORVASC) 10 MG tablet Take 1 tablet (10 mg total) by mouth daily.   atorvastatin (LIPITOR) 20 MG tablet Take 20 mg by mouth daily.   Blood Glucose Monitoring Suppl (ACCU-CHEK GUIDE) w/Device KIT USE AS DIRECTED   calcitRIOL (ROCALTROL) 0.25 MCG capsule    Calcium Carbonate Antacid (CALCIUM CARBONATE PO) Take 2 tablets by mouth.   carvedilol (COREG) 12.5 MG tablet Take 6.25 mg by mouth 2 (two) times daily with a meal.   Cholecalciferol (VITAMIN D3) 50 MCG (2000 UT) CAPS Take by mouth.   citalopram (CELEXA) 20 MG tablet TAKE 1 TABLET EVERY DAY   docusate sodium (COLACE) 100 MG capsule Take 100 mg by mouth 2 (two) times daily. Dok Plus BID   fexofenadine (ALLEGRA) 180 MG tablet Take 180 mg daily by mouth.   fluticasone (FLONASE) 50 MCG/ACT nasal spray USE 2 SPRAYS IN EACH NOSTRIL EVERY  DAY   glipiZIDE (GLUCOTROL) 5 MG tablet Take by mouth.   glucose blood (ACCU-CHEK GUIDE) test strip Use to check blood sugar once a day Dx Code E11.29   hydrALAZINE (APRESOLINE) 50 MG tablet Take 1 tablet (50 mg total) by mouth every 8 (eight) hours.   lactulose (CHRONULAC) 10 GM/15ML solution Take 15 mLs by mouth daily as needed for mild constipation.   mycophenolate (CELLCEPT) 250 MG capsule Take by mouth.   predniSONE (DELTASONE) 5 MG tablet 5 mg daily.   senna-docusate (SENOKOT-S) 8.6-50 MG tablet Take by mouth 2 (two) times daily as needed.   sulfamethoxazole-trimethoprim (BACTRIM DS) 800-160 MG tablet 1 tablet Monday, Wednesday, Friday only   tacrolimus (PROGRAF) 1 MG capsule 6 tablets qAM and 5 tablets qPM   tamsulosin (FLOMAX) 0.4 MG CAPS capsule TAKE 1 CAPSULE (0.4 MG TOTAL) BY MOUTH DAILY.   No facility-administered encounter medications on file as of 11/29/2020.    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[place]) 07/14/2014   Mood disorder ([place]) 01/12/2013   Routine general medical examination at a  health care facility 10/14/2012   Asthma 06/08/2012   Obesity 06/08/2012   Gout 01/06/2010   Chronic diastolic heart failure ([place]) 11/18/2009   ERECTILE DYSFUNCTION, ORGANIC 02/09/2008   Type 2 diabetes mellitus with renal manifestations, controlled ([place]) 09/29/2007   HLD (hyperlipidemia) 06/20/2007   Essential hypertension 06/20/2007   ALLERGIC RHINITIS 06/20/2007    Recent Relevant Labs: Lab Results  Component Value Date/Time   HGBA1C 7.6 (A) 10/18/2020 10:07 AM   HGBA1C 6.2 (H)  02/07/2020 07:23 AM   HGBA1C 6.6 (H) 10/13/2019 10:00 AM   MICROALBUR 37.6 (H) 10/09/2011 10:18 AM   MICROALBUR 8.1 (H) 06/28/2009 09:24 AM    Kidney Function Lab Results  Component Value Date/Time   CREATININE 6.66 (H) 05/09/2020 10:16 AM   CREATININE 10.07 (H) 02/12/2020 03:36 AM   GFR 17.94 (L) 03/05/2016 12:39 PM   GFRNONAA 8 (L) 05/09/2020 10:16 AM   GFRAA 9 (L) 05/09/2020 10:16 AM    Current antihyperglycemic regimen:  Glipizide 5 mg- 2 tablets in the morning   What recent interventions/DTPs have been made to improve glycemic control:  Glipizide increased to 2 tablets daily 11/22/20 per patient report   Have there been any recent hospitalizations or ED visits since last visit with CPP? No  Patient denies hypoglycemic symptoms, including Pale, Sweaty, Shaky, Hungry, Nervous/irritable and Vision changes  Patient denies hyperglycemic symptoms, including blurry vision, excessive thirst, fatigue, polyuria and weakness  How often are you checking your blood sugar? before breakfast and before dinner  What are your blood sugars ranging?  Fasting:  11/23/20-188 11/24/20-180 11/25/20-168 11/26/20-179 11/27/20-174 11/28/20-157 11/29/20-151 11/30/20-175  Before Dinner: (right before supper) 11/23/20- 211 11/24/20- 259 11/25/20- 191 11/26/20- 275 11/27/20- 174 11/28/20- 296 11/29/20 -237  After meals: N/A Bedtime: N/A  On insulin? No  During the week, how often does your blood glucose drop below 70? Never  Are you checking your feet daily/regularly? Yes- States he checks every day. Feet look good.   Adherence Review: Is the patient currently on a STATIN medication? Yes - Atorvastatin  Is the patient currently on ACE/ARB medication? Yes - Losartan Does the patient have >5 day gap between last estimated fill dates? See below   Patient states he was taken off Atorvastatin last year and was put back on it 09/28/20 so he has an abundance of Atorvastatin at home. He is finishing up the bottle that  was filled 02/04/20. He has a full 90 DS bottle that is dated 04/12/20. His wife also mentions that he was recently started back on Losartan. He is now taking two 5 mg tablets of glipizide each morning. Denies any missed doses. States glipzide was last filled 11/14/20 for 90 DS. He gets his medications from [facility] mail order.   Follow-Up:  Pharmacist Review  Debbora Dus, CPP notified  Margaretmary Dys, [facility] Assistant [phone]   Reviewed last endocrinology note in chart from [facility] on 09/26/20 - "Pt had a questing on new Glipizide dose that was advised. I have advised to take 7.5 mg daily (one and half tablet) and send [place] the BS reading in 2 weeks time. Pt voiced understanding." Contacted patient to see if he has followed up with endo since then.  Spoke with patient and wife on 11/30/20 at 10 AM. Reports patient is scheduled with [facility] endocrinology on 2/31/22. Reports he was instructed to increase glipizide from to 1.5 and 2 tablets 11/22/20. Patient is ok with [place] sending his BG log to endocrinology. Will fax. Also confirms  taking losartan 25 mg - 1 tablet at bedtime. Started by nephrology. Reports blood work next Thursday.   Debbora Dus, PharmD Clinical Pharmacist [facility] [phone]

## 2020-12-08 DIAGNOSIS — Z949 Transplanted organ and tissue status, unspecified: Secondary | ICD-10-CM | POA: Diagnosis not present

## 2020-12-08 DIAGNOSIS — Z940 Kidney transplant status: Secondary | ICD-10-CM | POA: Diagnosis not present

## 2020-12-08 DIAGNOSIS — Z4822 Encounter for aftercare following kidney transplant: Secondary | ICD-10-CM | POA: Diagnosis not present

## 2020-12-08 DIAGNOSIS — Z5181 Encounter for therapeutic drug level monitoring: Secondary | ICD-10-CM | POA: Diagnosis not present

## 2020-12-08 DIAGNOSIS — Z23 Encounter for immunization: Secondary | ICD-10-CM | POA: Diagnosis not present

## 2020-12-08 DIAGNOSIS — B1710 Acute hepatitis C without hepatic coma: Secondary | ICD-10-CM | POA: Diagnosis not present

## 2020-12-08 DIAGNOSIS — E1122 Type 2 diabetes mellitus with diabetic chronic kidney disease: Secondary | ICD-10-CM | POA: Diagnosis not present

## 2020-12-08 DIAGNOSIS — Z79899 Other long term (current) drug therapy: Secondary | ICD-10-CM | POA: Diagnosis not present

## 2020-12-08 DIAGNOSIS — D84821 Immunodeficiency due to drugs: Secondary | ICD-10-CM | POA: Diagnosis not present

## 2020-12-08 DIAGNOSIS — Z792 Long term (current) use of antibiotics: Secondary | ICD-10-CM | POA: Diagnosis not present

## 2020-12-08 DIAGNOSIS — Z298 Encounter for other specified prophylactic measures: Secondary | ICD-10-CM | POA: Diagnosis not present

## 2020-12-15 DIAGNOSIS — Z940 Kidney transplant status: Secondary | ICD-10-CM | POA: Diagnosis not present

## 2020-12-15 DIAGNOSIS — N281 Cyst of kidney, acquired: Secondary | ICD-10-CM | POA: Diagnosis not present

## 2020-12-15 DIAGNOSIS — Z949 Transplanted organ and tissue status, unspecified: Secondary | ICD-10-CM | POA: Diagnosis not present

## 2020-12-15 DIAGNOSIS — N2889 Other specified disorders of kidney and ureter: Secondary | ICD-10-CM | POA: Diagnosis not present

## 2020-12-15 DIAGNOSIS — Z5181 Encounter for therapeutic drug level monitoring: Secondary | ICD-10-CM | POA: Diagnosis not present

## 2020-12-22 DIAGNOSIS — N185 Chronic kidney disease, stage 5: Secondary | ICD-10-CM | POA: Diagnosis not present

## 2020-12-22 DIAGNOSIS — Z940 Kidney transplant status: Secondary | ICD-10-CM | POA: Diagnosis not present

## 2020-12-22 DIAGNOSIS — Z794 Long term (current) use of insulin: Secondary | ICD-10-CM | POA: Diagnosis not present

## 2020-12-22 DIAGNOSIS — E1122 Type 2 diabetes mellitus with diabetic chronic kidney disease: Secondary | ICD-10-CM | POA: Diagnosis not present

## 2020-12-22 DIAGNOSIS — E669 Obesity, unspecified: Secondary | ICD-10-CM | POA: Diagnosis not present

## 2021-01-02 DIAGNOSIS — Z5181 Encounter for therapeutic drug level monitoring: Secondary | ICD-10-CM | POA: Diagnosis not present

## 2021-01-02 DIAGNOSIS — Z940 Kidney transplant status: Secondary | ICD-10-CM | POA: Diagnosis not present

## 2021-01-19 DIAGNOSIS — Z949 Transplanted organ and tissue status, unspecified: Secondary | ICD-10-CM | POA: Diagnosis not present

## 2021-01-19 DIAGNOSIS — M109 Gout, unspecified: Secondary | ICD-10-CM | POA: Diagnosis not present

## 2021-01-19 DIAGNOSIS — Z940 Kidney transplant status: Secondary | ICD-10-CM | POA: Diagnosis not present

## 2021-01-30 DIAGNOSIS — I700 Atherosclerosis of aorta: Secondary | ICD-10-CM | POA: Diagnosis not present

## 2021-01-30 DIAGNOSIS — I5189 Other ill-defined heart diseases: Secondary | ICD-10-CM | POA: Diagnosis not present

## 2021-01-30 DIAGNOSIS — I5042 Chronic combined systolic (congestive) and diastolic (congestive) heart failure: Secondary | ICD-10-CM | POA: Diagnosis not present

## 2021-01-30 DIAGNOSIS — I159 Secondary hypertension, unspecified: Secondary | ICD-10-CM | POA: Diagnosis not present

## 2021-02-01 DIAGNOSIS — Z940 Kidney transplant status: Secondary | ICD-10-CM | POA: Diagnosis not present

## 2021-02-01 DIAGNOSIS — Z5181 Encounter for therapeutic drug level monitoring: Secondary | ICD-10-CM | POA: Diagnosis not present

## 2021-02-14 DIAGNOSIS — N185 Chronic kidney disease, stage 5: Secondary | ICD-10-CM | POA: Diagnosis not present

## 2021-02-14 DIAGNOSIS — Z794 Long term (current) use of insulin: Secondary | ICD-10-CM | POA: Diagnosis not present

## 2021-02-14 DIAGNOSIS — E1122 Type 2 diabetes mellitus with diabetic chronic kidney disease: Secondary | ICD-10-CM | POA: Diagnosis not present

## 2021-02-15 DIAGNOSIS — Z5181 Encounter for therapeutic drug level monitoring: Secondary | ICD-10-CM | POA: Diagnosis not present

## 2021-02-15 DIAGNOSIS — Z940 Kidney transplant status: Secondary | ICD-10-CM | POA: Diagnosis not present

## 2021-02-21 ENCOUNTER — Telehealth: Payer: Self-pay

## 2021-02-21 NOTE — Chronic Care Management (AMB) (Addendum)
    Chronic Care Management Pharmacy Assistant   Name: Richard Chen  MRN: [id number] DOB: 1956-07-05  Reason for Encounter: CCM Appointment Reminder    Medications: Outpatient Encounter Medications as of 02/21/2021  Medication Sig   Accu-Chek Softclix Lancets lancets Use to obtain blood sugar dx code E11.29   acetaminophen (TYLENOL) 500 MG tablet Take 500 mg by mouth every 6 (six) hours as needed (pain).    Alcohol Swabs (ALCOHOL PREP) PADS 1 Units by Does not apply route daily.   allopurinol (ZYLOPRIM) 300 MG tablet TAKE 1 TABLET EVERY DAY (Patient taking differently: Take 100 mg by mouth daily.)   amLODipine (NORVASC) 10 MG tablet Take 1 tablet (10 mg total) by mouth daily.   atorvastatin (LIPITOR) 20 MG tablet Take 20 mg by mouth daily.   Blood Glucose Monitoring Suppl (ACCU-CHEK GUIDE) w/Device KIT USE AS DIRECTED   calcitRIOL (ROCALTROL) 0.25 MCG capsule    Calcium Carbonate Antacid (CALCIUM CARBONATE PO) Take 2 tablets by mouth.   carvedilol (COREG) 12.5 MG tablet Take 6.25 mg by mouth 2 (two) times daily with a meal.   Cholecalciferol (VITAMIN D3) 50 MCG (2000 UT) CAPS Take by mouth.   citalopram (CELEXA) 20 MG tablet TAKE 1 TABLET EVERY DAY   docusate sodium (COLACE) 100 MG capsule Take 100 mg by mouth 2 (two) times daily. Dok Plus BID   fexofenadine (ALLEGRA) 180 MG tablet Take 180 mg daily by mouth.   fluticasone (FLONASE) 50 MCG/ACT nasal spray USE 2 SPRAYS IN EACH NOSTRIL EVERY DAY   glipiZIDE (GLUCOTROL) 5 MG tablet Take by mouth.   glucose blood (ACCU-CHEK GUIDE) test strip Use to check blood sugar once a day Dx Code E11.29   hydrALAZINE (APRESOLINE) 50 MG tablet Take 1 tablet (50 mg total) by mouth every 8 (eight) hours.   lactulose (CHRONULAC) 10 GM/15ML solution Take 15 mLs by mouth daily as needed for mild constipation.   mycophenolate (CELLCEPT) 250 MG capsule Take by mouth.   predniSONE (DELTASONE) 5 MG tablet 5 mg daily.   senna-docusate (SENOKOT-S) 8.6-50 MG  tablet Take by mouth 2 (two) times daily as needed.   sulfamethoxazole-trimethoprim (BACTRIM DS) 800-160 MG tablet 1 tablet Monday, Wednesday, Friday only   tacrolimus (PROGRAF) 1 MG capsule 6 tablets qAM and 5 tablets qPM   tamsulosin (FLOMAX) 0.4 MG CAPS capsule TAKE 1 CAPSULE (0.4 MG TOTAL) BY MOUTH DAILY.   No facility-administered encounter medications on file as of 02/21/2021.   [name] was contacted to remind him of his upcoming telephone visit with Debbora Dus on 02/23/21 at 9:15 .  he was reminded to have all medications, supplements and any blood glucose and blood pressure readings available for review at appointment.  Are you having any problems with your medications? Not at this time  What concerns would you like to discuss with the pharmacist? No concerns to discuss  Star Rating Drugs: Medication:  Last Fill: Day Supply Atorvastatin 20 mg 06/21/20 90 Glipizide 5 mg             06/17/18 [place], CPP notified  Margaretmary Dys, [place] Assistant [phone]  I have reviewed the care management and care coordination activities outlined in this encounter and I am certifying that I agree with the content of this note. No further action required.  Debbora Dus, PharmD Clinical Pharmacist [facility] at [facility] [phone]

## 2021-02-23 ENCOUNTER — Other Ambulatory Visit: Payer: Self-pay

## 2021-02-23 ENCOUNTER — Telehealth: Payer: Self-pay

## 2021-02-23 ENCOUNTER — Ambulatory Visit (INDEPENDENT_AMBULATORY_CARE_PROVIDER_SITE_OTHER): Payer: Medicare HMO

## 2021-02-23 DIAGNOSIS — E1121 Type 2 diabetes mellitus with diabetic nephropathy: Secondary | ICD-10-CM

## 2021-02-23 DIAGNOSIS — I10 Essential (primary) hypertension: Secondary | ICD-10-CM | POA: Diagnosis not present

## 2021-02-23 DIAGNOSIS — E785 Hyperlipidemia, unspecified: Secondary | ICD-10-CM

## 2021-02-23 DIAGNOSIS — F39 Unspecified mood [affective] disorder: Secondary | ICD-10-CM

## 2021-02-23 NOTE — Patient Instructions (Signed)
Dear Richard Chen,  Below is a summary of the goals we discussed during our follow up appointment on Feb 23, 2021. Please contact me anytime with questions or concerns.   Visit Information   Patient Care Plan: CCM Pharmacy Care Plan    Problem Identified: CHL AMB "PATIENT-SPECIFIC PROBLEM"     Long-Range Goal: [name]   Start Date: 02/23/2021  Priority: High  Note:   Current Barriers:  . Unable to independently afford treatment regimen - Trulicity  Pharmacist Clinical Goal(s):  [name] Patient will verbalize ability to afford treatment regimen through collaboration with PharmD and provider.   Interventions: . 1:1 collaboration with Venia Carbon, MD regarding development and update of comprehensive plan of care as evidenced by provider attestation and co-signature . Inter-disciplinary care team collaboration (see longitudinal plan of care) . Comprehensive medication review performed; medication list updated in electronic medical record  Hypertension (BP goal <140/90) -Not ideally controlled - managed by nephrology -Patient reports he is unsure of his target blood pressure  -Current treatment: . Amlodipine 10 mg - 1 tablet daily . Carvedilol 6.25 mg - 1 tablet twice daily (9 AM and 9 PM) . Hydralazine 25 mg - 1 tablet three times a day (9 AM, 1 PM, 9 PM) . Losartan 25 mg  - 1 tablet daily at 9 PM  -Medications previously tried: none reported -Multiple med changes since kidney transplant summer 2021 -Pt reports he has a new BP cuff, verified accurate by with [facility]  -Checks home BP in left arm, seated, with feet flat on the floor, after resting 5 minutes -Current home readings: checking BP three times a day  5/5 9 AM 153/71, 67 5/4 9 AM 153/68, 68 5/4 1 PM 130/67, 72 5/4 9 PM 156/70, 70 5/3 9 AM 159/73, 70 5/3 1 PM 128/68, 77 5/3 9 PM 162/76, 69 5/2 9 AM 152/74, 65 5/2 1 PM 139/58, 72 5/2 9 PM 127/67, 75 -Current dietary habits:   Caffeine:  Drinks diet  coke in the morning after checks BP  Sodium: does not add salt  OTCs - Denies NSAIDs or pseudoephedrine, just Allegra  -Current exercise habits: minimal -Denies hypotensive/hypertensive symptoms -Educated on BP goals and benefits of medications for prevention of heart attack, stroke and kidney damage; Proper BP monitoring technique; -Counseled to monitor BP at home daily, document, and provide log at future appointments -Recommended to continue current medication; Patient to discuss blood pressure target goal with specialist  Hyperlipidemia: (LDL goal < 70) -Controlled - LDL 65 -Current treatment: . Atorvastatin 20 mg - 1 tablet daily (9 PM) -Medications previously tried: none -Educated on Cholesterol goals;  -Recommended to continue current medication  Diabetes (A1c goal <7%) -Not ideally controlled - A1c 7.6% -Followed by Dr. Ronny Flurry -Current medications: . Trulicity 1.5 mg - Inject 3 mg once weekly (Increased to 2 injections (3 mg) Saturday 02/18/21) . Glipizide 10 mg - 1/2 tablet (5 mg) daily in the morning -Medications previously tried: insulin   -Current home glucose readings - checks twice daily 9 AM and 6 PM . fasting glucose:  o 02/23/21 - 103 (9 AM) o 02/22/21 - 120 o 02/21/21 - 128 o 02/20/21 - 120 o 02/19/21 - 113 o 02/18/21 - 118 o 02/17/21 - 131 . post prandial glucose:  o 02/22/21 - 167 (6 PM) o 02/21/21 - 190 o 02/20/21 - 149 o 02/19/21 - 174 o 02/18/21 - 223 o 02/17/21 - 116 -Denies hypoglycemic/hyperglycemic symptoms -Educated on A1c  and blood sugar goals; -Counseled to check feet daily and get yearly eye exams - up to date -Recommended to continue current medication Assessed patient finances. Patient would like to apply for Trulicity assistance program  Depression/Anxiety (Goal: Control symptoms) -Controlled - stable per patient, working well  -Current treatment: . Citalopram 20 mg - 1 tablet daily  -Medications previously tried/failed: none -Patient does not see  psychiatry  -Educated on Benefits of medication for symptom control Benefits of cognitive-behavioral therapy with or without medication -Recommended to continue current medication  OTC -  Vitamin D3 2000 IU Flonase nasal spray PRN  Other -  Allopurinol 100 mg - 1 tablet daily 9 AM  Cellcept 750 mg - 3 in morning and 2 at bedtime Prednisone 5 mg - 1 daily in morning  Tacrolimus 1 mg - 5 in the morning and 4 at bedtime   Patient Goals/Self-Care Activities . Patient will:  - take medications as prescribed  Follow Up Plan: Telephone follow up appointment with care management team member scheduled for: 6 months       The patient verbalized understanding of instructions, educational materials, and care plan provided today and agreed to receive a mailed copy of patient instructions, educational materials, and care plan.   Debbora Dus, PharmD Clinical Pharmacist [facility] at [facility] [phone]   Basics of Medicine Management Taking your medicines correctly is an important part of managing or preventing medical problems. Make sure you know what disease or condition your medicine is treating, and how and when to take it. If you do not take your medicine correctly, it may not work well and may cause unpleasant side effects, including serious health problems. What should I do when I am taking medicines?  Read all the labels and inserts that come with your medicines. Review the information often.  Talk with your pharmacist if you get a refill and notice a change in the size, color, or shape of your medicines.  Know the potential side effects for each medicine that you take.  Try to get all your medicines from the same pharmacy. The pharmacist will have all your information and will understand how your medicines will affect each other (interact).  Tell your health care provider about all your medicines, including over-the-counter medicines, vitamins, and herbal or  dietary supplements. He or she will make sure that nothing will interact with any of your prescribed medicines.   How can I take my medicines safely?  Take medicines only as told by your health care provider. ? Do not take more of your medicine than instructed. ? Do not take anyone else's medicines. ? Do not share your medicines with others. ? Do not stop taking your medicines unless your health care provider tells you to do so. ? You may need to avoid alcohol or certain foods or liquids when taking certain medicines. Follow your health care provider's instructions.  Do not split, mash, or chew your medicines unless your health care provider tells you to do so. Tell your health care provider if you have trouble swallowing your medicines.  For liquid medicine, use the dosing container that was provided. How should I organize my medicines? Know your medicines  Know what each of your medicines looks like. This includes size, color, and shape. Tell your health care provider if you are having trouble recognizing all the medicines that you are taking.  If you cannot tell your medicines apart because they look similar, keep them in original bottles.  If you cannot read the labels on the bottles, tell your pharmacist to put your medicines in containers with large print.  Review your medicines and your schedule with family members, a friend, or a caregiver. Use a pill organizer  Use a tool to organize your medicine schedule. Tools include a weekly pillbox, a written chart, a notebook, or a calendar.  Your tool should help you remember the following things about each medicine: ? The name of the medicine. ? The amount (dose) to take. ? The schedule. This is the day and time the medicine should be taken. ? The appearance. This includes color, shape, size, and stamp. ? How to take your medicines. This includes instructions to take them with food, without food, with fluids, or with other  medicines.  Create reminders for taking your medicines. Use sticky notes, or alarms on your watch, mobile device, or phone calendar.  You may choose to use a more advanced management system. These systems have storage, alarms, and visual and audio prompts.  Some medicines can be taken on an "as-needed" basis. These include medicines for nausea or pain. If you take an as-needed medicine, write down the name and dose, as well as the date and time that you took it.   How should I plan for travel?  Take your pillbox, medicines, and organization system with you when traveling.  Have your medicines refilled before you travel. This will ensure that you do not run out of your medicines while you are away from home.  Always carry an updated list of your medicines with you. If there is an emergency, a first responder can quickly see what medicines you are taking.  Do not pack your medicines in checked luggage in case your luggage is lost or delayed.  If any of your medicines is considered a controlled substance, make sure you bring a letter from your health care provider with you. How should I store and discard my medicines? For safe storage:  Store medicines in a cool, dry area away from light, or as directed by your health care provider. Do not store medicines in the bathroom. Heat and humidity will affect them.  Do not store your medicines with other chemicals, or with medicines for pets or other household members.  Keep medicines away from children and pets. Do not leave them on counters or bedside tables. Store them in high cabinets or on high shelves. For safe disposal:  Check expiration dates regularly. Do not take expired medicines. Discard medicines that are older than the expiration date.  Learn a safe way to dispose of your medicines. You may: ? Use a local government, hospital, or pharmacy medicine-take-back program. ? Mix the medicines with inedible substances, put them in a sealed  bag or empty container, and throw them in the trash. What should I remember?  Tell your health care provider if you: ? Experience side effects. ? Have new symptoms. ? Have other concerns about taking your medicines.  Review your medicines regularly with your health care provider. Other medicines, diet, medical conditions, weight changes, and daily habits can all affect how medicines work. Ask if you need to continue taking each medicine, and discuss how well each one is working.  Refill your medicines early to avoid running out of them.  In case of an accidental overdose, call your local [place] at [phone] or visit your local emergency department immediately. This is important. Summary  Taking your medicines correctly is an important part  of managing or preventing medical problems.  You need to make sure that you understand what you are taking a medicine for, as well as how and when you need to take it.  Know your medicines and use a pill organizer to help you take your medicines correctly.  In case of an accidental overdose, call your local [place] at [phone] or visit your local emergency department immediately. This is important. This information is not intended to replace advice given to you by your health care provider. Make sure you discuss any questions you have with your health care provider. Document Revised: 10/03/2017 Document Reviewed: 10/03/2017 Elsevier Patient Education  2021 [employer].

## 2021-02-23 NOTE — Chronic Care Management (AMB) (Signed)
    Chronic Care Management Pharmacy Assistant   Name: Richard Chen  MRN: [id number] DOB: [birth year]  Reason for Encounter: Patient Assistance Application- Trulicity   Medications: Outpatient Encounter Medications as of 02/23/2021  Medication Sig  . Accu-Chek Softclix Lancets lancets Use to obtain blood sugar dx code E11.29  . acetaminophen (TYLENOL) 500 MG tablet Take 500 mg by mouth every 6 (six) hours as needed (pain).   . Alcohol Swabs (ALCOHOL PREP) PADS 1 Units by Does not apply route daily.  [name] allopurinol (ZYLOPRIM) 300 MG tablet TAKE 1 TABLET EVERY DAY (Patient taking differently: Take 100 mg by mouth daily.)  . amLODipine (NORVASC) 10 MG tablet Take 1 tablet (10 mg total) by mouth daily.  [name] atorvastatin (LIPITOR) 20 MG tablet Take 20 mg by mouth daily.  . Blood Glucose Monitoring Suppl (ACCU-CHEK GUIDE) w/Device KIT USE AS DIRECTED  . calcitRIOL (ROCALTROL) 0.25 MCG capsule  (Patient not taking: Reported on 02/23/2021)  . Calcium Carbonate Antacid (CALCIUM CARBONATE PO) Take 2 tablets by mouth. (Patient not taking: Reported on 02/23/2021)  . carvedilol (COREG) 12.5 MG tablet Take 6.25 mg by mouth 2 (two) times daily with a meal.  . Cholecalciferol (VITAMIN D3) 50 MCG (2000 UT) CAPS Take by mouth.  . citalopram (CELEXA) 20 MG tablet TAKE 1 TABLET EVERY DAY  . docusate sodium (COLACE) 100 MG capsule Take 100 mg by mouth 2 (two) times daily. Dok Plus BID  . Dulaglutide (TRULICITY) 1.5 PQ/2.4SL SOPN Inject 3 mg into the skin once a week.  . fexofenadine (ALLEGRA) 180 MG tablet Take 180 mg daily by mouth.  . fluticasone (FLONASE) 50 MCG/ACT nasal spray USE 2 SPRAYS IN EACH NOSTRIL EVERY DAY  . glipiZIDE (GLUCOTROL) 5 MG tablet Take by mouth.  [name] glucose blood (ACCU-CHEK GUIDE) test strip Use to check blood sugar once a day Dx Code E11.29  . hydrALAZINE (APRESOLINE) 50 MG tablet Take 1 tablet (50 mg total) by mouth every 8 (eight) hours. (Patient taking differently: Take 50 mg by mouth  every 8 (eight) hours. 1/2 tablet (25 mg) TID)  . lactulose (CHRONULAC) 10 GM/15ML solution Take 15 mLs by mouth daily as needed for mild constipation. (Patient not taking: Reported on 02/23/2021)  . losartan (COZAAR) 25 MG tablet Take 25 mg by mouth daily.  . mycophenolate (CELLCEPT) 250 MG capsule Take 750 mg by mouth. Take 3 in the morning and 2 at bedtime  . predniSONE (DELTASONE) 5 MG tablet 5 mg daily.  [name] senna-docusate (SENOKOT-S) 8.6-50 MG tablet Take by mouth 2 (two) times daily as needed. (Patient not taking: Reported on 02/23/2021)  . sulfamethoxazole-trimethoprim (BACTRIM DS) 800-160 MG tablet 1 tablet Monday, Wednesday, Friday only  . tacrolimus (PROGRAF) 1 MG capsule 5 tablets qAM and 4 tablets qPM changed 12/20/20  . tamsulosin (FLOMAX) 0.4 MG CAPS capsule TAKE 1 CAPSULE (0.4 MG TOTAL) BY MOUTH DAILY.   No facility-administered encounter medications on file as of 02/23/2021.   Patient assistance application for Trulicity 3 mg was completed on behalf of the patient and uploaded to [facility] front desk for patient to come in and sign. Application will then need to be sent to Marvene Staff, MSN, NP-C  at [facility] to complete provider portion.    Follow-Up:  Patient Assistance Coordination and Pharmacist Review  Debbora Dus, CPP notified  Margaretmary Dys, [place] Pharmacy Assistant [phone]

## 2021-02-23 NOTE — Progress Notes (Addendum)
Chronic Care Management Pharmacy Note  02/23/2021 Name:  MARQUISE WICKE MRN:  [id number] DOB:  04/13/1956  Subjective: WARRICK LLERA is an 65 y.o. year old male who is a primary patient of Letvak, Theophilus Kinds, MD.  The CCM team was consulted for assistance with disease management and care coordination needs.    Engaged with patient by telephone for follow up visit in response to provider referral for pharmacy case management and/or care coordination services. Patient reported he is seen at [facility] for kidney transplant labs every 2 weeks. Follows with endocrinology. Recently started Trulicity. Reports tolerating well.  Consent to Services:  The patient was given information about Chronic Care Management services, agreed to services, and gave verbal consent prior to initiation of services.  Please see initial visit note for detailed documentation.   Patient Care Team: Venia Carbon, MD as PCP - Claris Gower, [facility] as Pharmacist (Pharmacist)  Recent office visits: 10/18/20 - PCP - DM foot exam completed, A1c ordered - 7.6%, acceptable control. Checks sugars BID, on glipizide only. Kidney transplant July 2021, followed by [facility]. Fell once in yard, no injury.   Recent consult visits: None since last CCM visit 08/31/20  Hospital visits: None in previous 6 months  Objective:  Lab Results  Component Value Date   CREATININE 6.66 (H) 05/09/2020   BUN 37 (H) 05/09/2020   GFR 17.94 (L) 03/05/2016   GFRNONAA 8 (L) 05/09/2020   GFRAA 9 (L) 05/09/2020   NA 136 05/09/2020   K 3.7 05/09/2020   CALCIUM 8.1 (L) 05/09/2020   CO2 30 05/09/2020   GLUCOSE 151 (H) 05/09/2020    Lab Results  Component Value Date/Time   HGBA1C 7.6 (A) 10/18/2020 10:07 AM   HGBA1C 6.2 (H) 02/07/2020 07:23 AM   HGBA1C 6.6 (H) 10/13/2019 10:00 AM   GFR 17.94 (L) 03/05/2016 12:39 PM   GFR 53.30 (L) 02/18/2015 12:25 PM   MICROALBUR 37.6 (H) 10/09/2011 10:18 AM   MICROALBUR 8.1 (H) 06/28/2009 09:24 AM     Last diabetic Eye exam:  Lab Results  Component Value Date/Time   HMDIABEYEEXA No Retinopathy 05/10/2020 12:00 AM    Last diabetic Foot exam:  Lab Results  Component Value Date/Time   HMDIABFOOTEX done 10/18/2020 12:00 AM     Lab Results  Component Value Date   CHOL 133 10/13/2019   HDL 42.90 10/13/2019   LDLCALC 65 10/13/2019   LDLDIRECT 166.5 10/26/2013   TRIG 125.0 10/13/2019   CHOLHDL 3 10/13/2019    Hepatic Function Latest Ref Rng & Units 05/09/2020 02/07/2020 10/13/2019  Total Protein 6.5 - 8.1 g/dL 7.9 7.6 6.8  Albumin 3.5 - 5.0 g/dL 4.1 3.7 3.7  AST 15 - 41 U/L _0 ALT 0 - 44 U/L _1 Alk Phosphatase 38 - 126 U/L 74 76 72  Total Bilirubin 0.3 - 1.2 mg/dL 0.7 0.8 0.3  Bilirubin, Direct 0.0 - 0.3 mg/dL - - 0.1    Lab Results  Component Value Date/Time   TSH 1.79 03/26/2018 09:37 AM   TSH 1.10 07/14/2014 11:43 AM   FREET4 1.02 10/13/2019 10:00 AM   FREET4 1.04 03/26/2018 09:37 AM    CBC Latest Ref Rng & Units 05/09/2020 02/07/2020 10/03/2019  WBC 4.0 - 10.5 K/uL 7.7 6.6 6.8  Hemoglobin 13.0 - 17.0 g/dL 11.1(L) 11.4(L) 10.1(L)  Hematocrit 39.0 - 52.0 % 33.7(L) 37.0(L) 32.2(L)  Platelets 150 - 400 K/uL 146(L) 161 152    No results  found for: VD25OH  Clinical ASCVD: Yes  The 10-year ASCVD risk score Mikey Bussing DC Jr., et al., 2013) is: 26.4%   Values used to calculate the score:     Age: 76 years     Sex: Male     Is Non-Hispanic African American: Yes     Diabetic: Yes     Tobacco smoker: No     Systolic Blood Pressure: 354 mmHg     Is BP treated: Yes     HDL Cholesterol: 46 mg/dL     Total Cholesterol: 181 mg/dL    Social History   Tobacco Use  Smoking Status Never Smoker  Smokeless Tobacco Never Used   BP Readings from Last 3 Encounters:  10/18/20 116/64  08/25/20 137/66  05/09/20 (!) 164/63   Pulse Readings from Last 3 Encounters:  10/18/20 (!) 53  08/25/20 (!) 56  05/09/20 (!) 50   Wt Readings from Last 3 Encounters:  10/18/20  240 lb (108.9 kg)  08/25/20 235 lb 14.3 oz (107 kg)  05/09/20 250 lb (113.4 kg)   BMI Readings from Last 3 Encounters:  10/18/20 32.10 kg/m  08/25/20 31.12 kg/m  05/09/20 32.98 kg/m    Assessment/Interventions: Review of patient past medical history, allergies, medications, health status, including review of consultants reports, laboratory and other test data, was performed as part of comprehensive evaluation and provision of chronic care management services.   SDOH:  (Social Determinants of Health) assessments and interventions performed: Yes SDOH Interventions   Flowsheet Row Most Recent Value  SDOH Interventions   Financial Strain Interventions Other (Comment)  [Apply for Trulicity assistance]     SDOH Screenings   Alcohol Screen: Not on file  Depression (SFK8-1): Not on file  Financial Resource Strain: Medium Risk  . Difficulty of Paying Living Expenses: Somewhat hard  Food Insecurity: Not on file  Housing: Not on file  Physical Activity: Not on file  Social Connections: Not on file  Stress: Not on file  Tobacco Use: Low Risk   . Smoking Tobacco Use: Never Smoker  . Smokeless Tobacco Use: Never Used  Transportation Needs: Not on file    CCM Care Plan  Allergies  Allergen Reactions  . Aspirin Anaphylaxis  . Shellfish Allergy Anaphylaxis  . Clonidine Other (See Comments)    Gait imbalance/ dysfunction requiring wheelchair.  Increased lethargy.   . Other Hives    Dye when viewed kidney (break out in knots)  . Contrast Media [Iodinated Diagnostic Agents] Rash and Hives    Medications Reviewed Today    Reviewed by Venia Carbon, MD (Physician) on 10/18/20 at [phone]  Med List Status: <None>  Medication Order Taking? Sig Documenting Provider Last Dose Status Informant  Accu-Chek Softclix Lancets lancets [id number] Yes Use to obtain blood sugar dx code E11.29 Venia Carbon, MD Taking Active   acetaminophen (TYLENOL) 500 MG tablet [id number] Yes Take 500 mg by  mouth every 6 (six) hours as needed (pain).  [provider] Taking Active Spouse/Significant Other  Alcohol Swabs (ALCOHOL PREP) PADS [id number] Yes 1 Units by Does not apply route daily. Venia Carbon, MD Taking Active Spouse/Significant Other  allopurinol (ZYLOPRIM) 300 MG tablet [id number] Yes TAKE 1 TABLET EVERY DAY  Patient taking differently: Take 100 mg by mouth daily.   Venia Carbon, MD Taking Active Spouse/Significant Other  amLODipine (NORVASC) 10 MG tablet [id number] Yes Take 1 tablet (10 mg total) by mouth daily. Debbe Odea, MD Taking Active   atorvastatin (LIPITOR) 20  MG tablet [id number] Yes Take 20 mg by mouth daily. [provider] Taking Active   Blood Glucose Monitoring Suppl (ACCU-CHEK GUIDE) w/Device KIT [id number] Yes USE AS DIRECTED Venia Carbon, MD Taking Active   calcitRIOL (ROCALTROL) 0.25 MCG capsule [id number] Yes  [provider] Taking Active   Calcium Carbonate Antacid (CALCIUM CARBONATE PO) [id number] Yes Take 2 tablets by mouth. [provider] Taking Active   carvedilol (COREG) 12.5 MG tablet [id number] Yes Take 6.25 mg by mouth 2 (two) times daily with a meal. [provider] Taking Active   Cholecalciferol (VITAMIN D3) 50 MCG (2000 UT) CAPS [id number] Yes Take by mouth. [provider] Taking Active   citalopram (CELEXA) 20 MG tablet [id number] Yes TAKE 1 TABLET EVERY DAY Venia Carbon, MD Taking Active   docusate sodium (COLACE) 100 MG capsule [id number] Yes Take 100 mg by mouth 2 (two) times daily. Dok Plus BID [provider] Taking Active Spouse/Significant Other  fexofenadine (ALLEGRA) 180 MG tablet [id number] Yes Take 180 mg daily by mouth. [provider] Taking Active Spouse/Significant Other  fluticasone (FLONASE) 50 MCG/ACT nasal spray [id number] Yes USE 2 SPRAYS IN EACH NOSTRIL EVERY DAY Venia Carbon, MD Taking Active   glipiZIDE (GLUCOTROL) 5 MG tablet [id number] Yes  Take by mouth. [provider] Taking Active   glucose blood (ACCU-CHEK GUIDE) test strip [id number] Yes Use to check blood sugar once a day Dx Code E11.29 Venia Carbon, MD Taking Active   hydrALAZINE (APRESOLINE) 50 MG tablet [id number] Yes Take 1 tablet (50 mg total) by mouth every 8 (eight) hours. Debbe Odea, MD Taking Active   lactulose [facility]) 10 GM/15ML solution [id number] Yes Take 15 mLs by mouth daily as needed for mild constipation. [provider] Taking Active Spouse/Significant Other  mycophenolate (CELLCEPT) 250 MG capsule [id number] Yes Take by mouth. [provider] Taking Active   predniSONE (DELTASONE) 5 MG tablet [id number] Yes 5 mg daily. [provider] Taking Active   senna-docusate (SENOKOT-S) 8.6-50 MG tablet [id number] Yes Take by mouth 2 (two) times daily as needed. [provider] Taking Active   sulfamethoxazole-trimethoprim (BACTRIM DS) 800-160 MG tablet [id number] Yes 1 tablet Monday, Wednesday, Friday only [provider] Taking Active   tacrolimus (PROGRAF) 1 MG capsule [id number] Yes 6 tablets qAM and 5 tablets qPM [provider] Taking Active   tamsulosin (FLOMAX) 0.4 MG CAPS capsule [id number] Yes TAKE 1 CAPSULE (0.4 MG TOTAL) BY MOUTH DAILY. Nori Riis, PA-C Taking Active           Patient Active Problem List   Diagnosis Date Noted  . Kidney transplant status 10/18/2020  . BPH without obstruction/lower urinary tract symptoms 10/18/2020  . History of anemia due to chronic kidney disease 02/07/2020  . Aortic atherosclerosis ([place]) 10/13/2019  . Abdominal wall bulge 08/28/2017  . Sinus bradycardia 02/06/2017  . Pure hypercholesterolemia 06/21/2016  . 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[place]) 07/14/2014  . Mood disorder ([place]) 01/12/2013  . Routine general medical examination at a health care facility 10/14/2012  . Asthma 06/08/2012  . Obesity 06/08/2012  . Gout  01/06/2010  . Chronic diastolic heart failure ([place]) 11/18/2009  . ERECTILE DYSFUNCTION, ORGANIC 02/09/2008  . Type 2 diabetes mellitus with renal manifestations, controlled ([place]) 09/29/2007  . HLD (hyperlipidemia) 06/20/2007  . Essential hypertension 06/20/2007  . ALLERGIC RHINITIS 06/20/2007    Immunization History  Administered Date(s) Administered  . Influenza Split 07/18/2011, 08/22/2012  . Influenza Whole 07/24/2007,  07/26/2008, 08/25/2009  . Influenza, High Dose Seasonal PF 09/01/2020  . Influenza, Seasonal, Injecte, Preservative Fre 07/04/2016  . Influenza,inj,Quad PF,6+ Mos 07/16/2013, 07/14/2014, 08/23/2015  . Influenza-Unspecified 08/06/2017, 07/22/2018  . Moderna Sars-Covid-2 Vaccination 11/27/2019, 12/25/2019, 09/01/2020  . PPD Test 03/26/2016, 04/09/2016, 03/22/2017, 04/04/2018, 04/03/2019  . Pneumococcal Conjugate-13 04/05/2016  . Pneumococcal Polysaccharide-23 10/09/2011, 03/06/2017  . Td 06/28/2009  . Zoster 03/12/2016  . Zoster Recombinat (Shingrix) 12/31/2018, 05/14/2019    Conditions to be addressed/monitored:  Hypertension, Hyperlipidemia, Diabetes and Depression  Care Plan : [place]  Updates made by Debbora Dus, [facility] since 02/23/2021 12:00 AM    Problem: CHL AMB "PATIENT-SPECIFIC PROBLEM"     Long-Range Goal: [place]   Start Date: 02/23/2021  Priority: High  Note:   Current Barriers:  . Unable to independently afford treatment regimen - Trulicity  Pharmacist Clinical Goal(s):  [name] Patient will verbalize ability to afford treatment regimen through collaboration with PharmD and provider.   Interventions: . 1:1 collaboration with Venia Carbon, MD regarding development and update of comprehensive plan of care as evidenced by provider attestation and co-signature . Inter-disciplinary care team collaboration (see longitudinal plan of care) . Comprehensive medication review performed; medication list updated in electronic  medical record  Hypertension (BP goal <140/90) -Not ideally controlled - managed by nephrology -Patient reports he is unsure of his target blood pressure  -Current treatment: . Amlodipine 10 mg - 1 tablet daily . Carvedilol 6.25 mg - 1 tablet twice daily (9 AM and 9 PM) . Hydralazine 25 mg - 1 tablet three times a day (9 AM, 1 PM, 9 PM) . Losartan 25 mg  - 1 tablet daily at 9 PM  -Medications previously tried: none reported -Multiple med changes since kidney transplant summer 2021 -Pt reports he has a new BP cuff, verified accurate by with [facility]  -Checks home BP in left arm, seated, with feet flat on the floor, after resting 5 minutes -Current home readings: checking BP three times a day  5/5 9 AM 153/71, 67 5/4 9 AM 153/68, 68 5/4 1 PM 130/67, 72 5/4 9 PM 156/70, 70 5/3 9 AM 159/73, 70 5/3 1 PM 128/68, 77 5/3 9 PM 162/76, 69 5/2 9 AM 152/74, 65 5/2 1 PM 139/58, 72 5/2 9 PM 127/67, 75 -Current dietary habits:   Caffeine:  Drinks diet coke in the morning after checks BP  Sodium: does not add salt  OTCs - Denies NSAIDs or pseudoephedrine, just Allegra  -Current exercise habits: minimal -Denies hypotensive/hypertensive symptoms -Educated on BP goals and benefits of medications for prevention of heart attack, stroke and kidney damage; Proper BP monitoring technique; -Counseled to monitor BP at home daily, document, and provide log at future appointments -Recommended to continue current medication; Patient to discuss blood pressure target goal with specialist  Hyperlipidemia: (LDL goal < 70) -Controlled - LDL 65 -Current treatment: . Atorvastatin 20 mg - 1 tablet daily (9 PM) -Medications previously tried: none -Educated on Cholesterol goals;  -Recommended to continue current medication  Diabetes (A1c goal <7%) -Not ideally controlled - A1c 7.6% -Followed by Dr. Ronny Flurry -Current medications: . Trulicity 1.5 mg - Inject 3 mg once weekly (Increased to 2 injections (3 mg)  Saturday 02/18/21) . Glipizide 10 mg - 1/2 tablet (5 mg) daily in the morning -Medications previously tried: insulin   -Current home glucose readings - checks twice daily 9 AM and 6 PM . fasting glucose:  o 02/23/21 - 103 (9  AM) o 02/22/21 - 120 o 02/21/21 - 128 o 02/20/21 - 120 o 02/19/21 - 113 o 02/18/21 - 118 o 02/17/21 - 131 . post prandial glucose:  o 02/22/21 - 167 (6 PM) o 02/21/21 - 190 o 02/20/21 - 149 o 02/19/21 - 174 o 02/18/21 - 223 o 02/17/21 - 116 -Denies hypoglycemic/hyperglycemic symptoms -Educated on A1c and blood sugar goals; -Counseled to check feet daily and get yearly eye exams - up to date -Recommended to continue current medication Assessed patient finances. Patient would like to apply for Trulicity assistance program  Depression/Anxiety (Goal: Control symptoms) -Controlled - stable per patient, working well  -Current treatment: . Citalopram 20 mg - 1 tablet daily  -Medications previously tried/failed: none -Patient does not see psychiatry  -Educated on Benefits of medication for symptom control Benefits of cognitive-behavioral therapy with or without medication -Recommended to continue current medication  OTC -  Vitamin D3 2000 IU Flonase nasal spray PRN  Other -  Allopurinol 100 mg - 1 tablet daily 9 AM  Cellcept 750 mg - 3 in morning and 2 at bedtime Prednisone 5 mg - 1 daily in morning  Tacrolimus 1 mg - 5 in the morning and 4 at bedtime   Patient Goals/Self-Care Activities . Patient will:  - take medications as prescribed  Follow Up Plan: Telephone follow up appointment with care management team member scheduled for: 6 months       Medication Assistance: Application for Trulicity  medication assistance program. in process.  Anticipated assistance start date 03/22/21.  See plan of care for additional detail. Patient will come in office on 02/24/21 to sign paperwork which will then be faxed to Dr. Ronny Flurry to sign.  Patient's preferred pharmacy is: [facility]  [address] [place] - [address] [place] [address] Phone: [phone] Fax: [facility] - [place], [place] [address] Phone: [phone] Fax: [phone]  Maintenance medications through [facility]  Uses pill box? Yes Pt endorses 100% compliance  We discussed: Current pharmacy is preferred with insurance plan and patient is satisfied with pharmacy services Patient decided to: Continue current medication management strategy  Care Plan and Follow Up Patient Decision:  Patient agrees to Care Plan and Follow-up.  Debbora Dus, PharmD Clinical Pharmacist [facility] at [facility] [phone]   Encounter details: CCM Time Spent      Value Time User   Time spent with patient (minutes)  35 02/23/2021 10:26 PM Debbora Dus, [facility]   Time spent performing Chart review  30 02/23/2021 10:26 PM Debbora Dus, [facility]   Total time (minutes)  65 02/23/2021 10:26 PM Debbora Dus, RPH     Moderate to High Complex Decision Making      Value Time User   Moderate to High complex decision making  Yes 02/23/2021 10:24 PM Debbora Dus, [facility]     CCM Services: Complex  Prior to outreach and patient consent for Chronic Care Management, I referred this patient for services after reviewing the nominated patient list or from a personal encounter with the patient.  I have personally reviewed this encounter including the documentation in this note and have collaborated with the care management provider regarding care management and care coordination activities to include development and update of the comprehensive care plan. I am certifying that I agree with the content of this note and encounter as supervising physician.

## 2021-03-08 DIAGNOSIS — Z940 Kidney transplant status: Secondary | ICD-10-CM | POA: Diagnosis not present

## 2021-03-08 DIAGNOSIS — Z5181 Encounter for therapeutic drug level monitoring: Secondary | ICD-10-CM | POA: Diagnosis not present

## 2021-03-14 NOTE — Progress Notes (Signed)
CCM care plan from [place] appointment on 02/23/21 with Debbora Dus, Pharm. D, was mailed to him per his request.   Debbora Dus, CPP notified  Margaretmary Dys, [place] Pharmacy Assistant [phone]

## 2021-03-17 ENCOUNTER — Telehealth: Payer: Self-pay

## 2021-03-17 MED ORDER — ATORVASTATIN CALCIUM 20 MG PO TABS: 20.0000 mg | ORAL_TABLET | Freq: Every day | ORAL | 3 refills | Status: DC

## 2021-03-17 NOTE — Telephone Encounter (Signed)
Rx sent electronically.  

## 2021-03-30 DIAGNOSIS — Z794 Long term (current) use of insulin: Secondary | ICD-10-CM | POA: Diagnosis not present

## 2021-03-30 DIAGNOSIS — E1122 Type 2 diabetes mellitus with diabetic chronic kidney disease: Secondary | ICD-10-CM | POA: Diagnosis not present

## 2021-03-30 DIAGNOSIS — N185 Chronic kidney disease, stage 5: Secondary | ICD-10-CM | POA: Diagnosis not present

## 2021-03-31 DIAGNOSIS — Z949 Transplanted organ and tissue status, unspecified: Secondary | ICD-10-CM | POA: Diagnosis not present

## 2021-03-31 DIAGNOSIS — D849 Immunodeficiency, unspecified: Secondary | ICD-10-CM | POA: Diagnosis not present

## 2021-03-31 DIAGNOSIS — Z940 Kidney transplant status: Secondary | ICD-10-CM | POA: Diagnosis not present

## 2021-04-05 DIAGNOSIS — I159 Secondary hypertension, unspecified: Secondary | ICD-10-CM | POA: Diagnosis not present

## 2021-04-05 DIAGNOSIS — I5189 Other ill-defined heart diseases: Secondary | ICD-10-CM | POA: Diagnosis not present

## 2021-04-06 ENCOUNTER — Telehealth: Payer: Self-pay

## 2021-04-06 NOTE — Progress Notes (Addendum)
    Chronic Care Management Pharmacy Assistant   Name: MAXIMO SPRATLING  MRN: [id number] DOB: Sep 06, 1956  Reason for Encounter: Patient Assistance Update- Trulicity  Medications: Outpatient Encounter Medications as of 04/06/2021  Medication Sig   Accu-Chek Softclix Lancets lancets Use to obtain blood sugar dx code E11.29   acetaminophen (TYLENOL) 500 MG tablet Take 500 mg by mouth every 6 (six) hours as needed (pain).    Alcohol Swabs (ALCOHOL PREP) PADS 1 Units by Does not apply route daily.   allopurinol (ZYLOPRIM) 300 MG tablet TAKE 1 TABLET EVERY DAY (Patient taking differently: Take 100 mg by mouth daily.)   amLODipine (NORVASC) 10 MG tablet Take 1 tablet (10 mg total) by mouth daily.   atorvastatin (LIPITOR) 20 MG tablet Take 1 tablet (20 mg total) by mouth daily.   Blood Glucose Monitoring Suppl (ACCU-CHEK GUIDE) w/Device KIT USE AS DIRECTED   calcitRIOL (ROCALTROL) 0.25 MCG capsule  (Patient not taking: Reported on 02/23/2021)   Calcium Carbonate Antacid (CALCIUM CARBONATE PO) Take 2 tablets by mouth. (Patient not taking: Reported on 02/23/2021)   carvedilol (COREG) 12.5 MG tablet Take 6.25 mg by mouth 2 (two) times daily with a meal.   Cholecalciferol (VITAMIN D3) 50 MCG (2000 UT) CAPS Take by mouth.   citalopram (CELEXA) 20 MG tablet TAKE 1 TABLET EVERY DAY   docusate sodium (COLACE) 100 MG capsule Take 100 mg by mouth 2 (two) times daily. Dok Plus BID   Dulaglutide (TRULICITY) 1.5 IR/4.4RX SOPN Inject 3 mg into the skin once a week.   fexofenadine (ALLEGRA) 180 MG tablet Take 180 mg daily by mouth.   fluticasone (FLONASE) 50 MCG/ACT nasal spray USE 2 SPRAYS IN EACH NOSTRIL EVERY DAY   glipiZIDE (GLUCOTROL) 5 MG tablet Take by mouth.   glucose blood (ACCU-CHEK GUIDE) test strip Use to check blood sugar once a day Dx Code E11.29   hydrALAZINE (APRESOLINE) 50 MG tablet Take 1 tablet (50 mg total) by mouth every 8 (eight) hours. (Patient taking differently: Take 50 mg by mouth every 8  (eight) hours. 1/2 tablet (25 mg) TID)   lactulose (CHRONULAC) 10 GM/15ML solution Take 15 mLs by mouth daily as needed for mild constipation. (Patient not taking: Reported on 02/23/2021)   losartan (COZAAR) 25 MG tablet Take 25 mg by mouth daily.   mycophenolate (CELLCEPT) 250 MG capsule Take 750 mg by mouth. Take 3 in the morning and 2 at bedtime   predniSONE (DELTASONE) 5 MG tablet 5 mg daily.   senna-docusate (SENOKOT-S) 8.6-50 MG tablet Take by mouth 2 (two) times daily as needed. (Patient not taking: Reported on 02/23/2021)   sulfamethoxazole-trimethoprim (BACTRIM DS) 800-160 MG tablet 1 tablet Monday, Wednesday, Friday only   tacrolimus (PROGRAF) 1 MG capsule 5 tablets qAM and 4 tablets qPM changed 12/20/20   tamsulosin (FLOMAX) 0.4 MG CAPS capsule TAKE 1 CAPSULE (0.4 MG TOTAL) BY MOUTH DAILY.   No facility-administered encounter medications on file as of 04/06/2021.    [place]  to follow up on patient assistance application for Trulicity. Per representative at [facility] states patient has been approved starting 03/31/21 - 10/21/21   Follow-Up:  Patient Assistance Coordination and Pharmacist Review  Debbora Dus, CPP notified  Margaretmary Dys, [place] Pharmacy Assistant [phone]

## 2021-04-10 DIAGNOSIS — I5032 Chronic diastolic (congestive) heart failure: Secondary | ICD-10-CM | POA: Diagnosis not present

## 2021-04-10 DIAGNOSIS — Z6832 Body mass index (BMI) 32.0-32.9, adult: Secondary | ICD-10-CM | POA: Diagnosis not present

## 2021-04-10 DIAGNOSIS — E6609 Other obesity due to excess calories: Secondary | ICD-10-CM | POA: Diagnosis not present

## 2021-04-10 DIAGNOSIS — I10 Essential (primary) hypertension: Secondary | ICD-10-CM | POA: Diagnosis not present

## 2021-04-20 ENCOUNTER — Ambulatory Visit (INDEPENDENT_AMBULATORY_CARE_PROVIDER_SITE_OTHER): Payer: Medicare HMO | Admitting: Internal Medicine

## 2021-04-20 ENCOUNTER — Encounter: Payer: Self-pay | Admitting: Internal Medicine

## 2021-04-20 ENCOUNTER — Other Ambulatory Visit: Payer: Self-pay

## 2021-04-20 VITALS — BP 108/62 | HR 66 | Temp 98.8°F | Ht 72.0 in | Wt 245.8 lb

## 2021-04-20 DIAGNOSIS — E1121 Type 2 diabetes mellitus with diabetic nephropathy: Secondary | ICD-10-CM

## 2021-04-20 DIAGNOSIS — S069X0S Unspecified intracranial injury without loss of consciousness, sequela: Secondary | ICD-10-CM | POA: Diagnosis not present

## 2021-04-20 DIAGNOSIS — I5032 Chronic diastolic (congestive) heart failure: Secondary | ICD-10-CM

## 2021-04-20 LAB — POCT GLYCOSYLATED HEMOGLOBIN (HGB A1C): Hemoglobin A1C: 7 % — AB (ref 4.0–5.6)

## 2021-04-20 NOTE — Assessment & Plan Note (Signed)
Doing well--weaning off glipizide Will be increasing trulicity ---should help weight without hypoglycemia Lab Results  Component Value Date   HGBA1C 7.0 (A) 04/20/2021   improved
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
## 2021-04-20 NOTE — Assessment & Plan Note (Signed)
Compensated with losartan, carvedilol and renal transplant

## 2021-05-03 DIAGNOSIS — Z940 Kidney transplant status: Secondary | ICD-10-CM | POA: Diagnosis not present

## 2021-05-24 DIAGNOSIS — Z940 Kidney transplant status: Secondary | ICD-10-CM | POA: Diagnosis not present

## 2021-05-31 ENCOUNTER — Telehealth: Payer: Self-pay

## 2021-05-31 NOTE — Chronic Care Management (AMB) (Addendum)
Chronic Care Management Pharmacy Assistant   Name: Richard Chen  MRN: [id number] DOB: 02/15/1956  Reason for Encounter: Diabetes   Recent office visits:  04/20/21 [place] PCP - Patient presented for follow up of diabetes and other chronic health conditions. He is seeing [place] endocrinology and if off glipizide and will be increasing Trulicity to 3 mg. A1c improving 7%.  Recent consult visits:  04/10/21- Cardiology - Patient presented for follow up visit HTN. Continue to monitor BP at home. Follow DASH diet. Follow up 4 months.  Hospital visits:  None in previous 6 months  Medications: Outpatient Encounter Medications as of 05/31/2021  Medication Sig   Accu-Chek Softclix Lancets lancets Use to obtain blood sugar dx code E11.29   acetaminophen (TYLENOL) 500 MG tablet Take 500 mg by mouth every 6 (six) hours as needed (pain).    Alcohol Swabs (ALCOHOL PREP) PADS 1 Units by Does not apply route daily.   allopurinol (ZYLOPRIM) 300 MG tablet TAKE 1 TABLET EVERY DAY (Patient taking differently: Take 100 mg by mouth daily.)   amLODipine (NORVASC) 10 MG tablet Take 1 tablet (10 mg total) by mouth daily.   atorvastatin (LIPITOR) 20 MG tablet Take 1 tablet (20 mg total) by mouth daily.   Blood Glucose Monitoring Suppl (ACCU-CHEK GUIDE) w/Device KIT USE AS DIRECTED   carvedilol (COREG) 12.5 MG tablet Take 6.25 mg by mouth 2 (two) times daily with a meal.   Cholecalciferol (VITAMIN D3) 50 MCG (2000 UT) CAPS Take by mouth.   citalopram (CELEXA) 20 MG tablet TAKE 1 TABLET EVERY DAY   docusate sodium (COLACE) 100 MG capsule Take 100 mg by mouth 2 (two) times daily. Dok Plus BID   Dulaglutide (TRULICITY) 1.5 GY/5.6LS SOPN Inject 3 mg into the skin once a week.   fexofenadine (ALLEGRA) 180 MG tablet Take 180 mg daily by mouth.   fluticasone (FLONASE) 50 MCG/ACT nasal spray USE 2 SPRAYS IN EACH NOSTRIL EVERY DAY   glucose blood (ACCU-CHEK GUIDE) test strip Use to check blood sugar once a day Dx  Code E11.29   hydrALAZINE (APRESOLINE) 50 MG tablet Take 1 tablet (50 mg total) by mouth every 8 (eight) hours. (Patient taking differently: Take 50 mg by mouth every 8 (eight) hours. 1/2 tablet (25 mg) TID)   losartan (COZAAR) 25 MG tablet Take 25 mg by mouth daily.   mycophenolate (CELLCEPT) 250 MG capsule Take 750 mg by mouth. Take 3 in the morning and 2 at bedtime   predniSONE (DELTASONE) 5 MG tablet 5 mg daily.   senna-docusate (SENOKOT-S) 8.6-50 MG tablet Take by mouth 2 (two) times daily as needed.   sulfamethoxazole-trimethoprim (BACTRIM DS) 800-160 MG tablet 1 tablet Monday, Wednesday, Friday only   tacrolimus (PROGRAF) 1 MG capsule 5 tablets qAM and 4 tablets qPM changed 12/20/20   tamsulosin (FLOMAX) 0.4 MG CAPS capsule TAKE 1 CAPSULE (0.4 MG TOTAL) BY MOUTH DAILY.   No facility-administered encounter medications on file as of 05/31/2021.      Recent Relevant Labs: Lab Results  Component Value Date/Time   HGBA1C 7.0 (A) 04/20/2021 02:47 PM   HGBA1C 7.6 (A) 10/18/2020 10:07 AM   HGBA1C 6.2 (H) 02/07/2020 07:23 AM   HGBA1C 6.6 (H) 10/13/2019 10:00 AM   MICROALBUR 37.6 (H) 10/09/2011 10:18 AM   MICROALBUR 8.1 (H) 06/28/2009 09:24 AM    Kidney Function Lab Results  Component Value Date/Time   CREATININE 6.66 (H) 05/09/2020 10:16 AM   CREATININE 10.07 (H) 02/12/2020 03:36  AM   GFR 17.94 (L) 03/05/2016 12:39 PM   GFRNONAA 8 (L) 05/09/2020 10:16 AM   GFRAA 9 (L) 05/09/2020 10:16 AM    Contacted patient on 05/31/2021 to discuss diabetes disease state.   Current antihyperglycemic regimen:   Trulicity 3 mg - Inject 3 mg once weekly (Saturdays)  Patient verbally confirms he is taking the above medications as directed. Yes  What diet changes have been made to improve diabetes control? The patient reports home cooked meals without added sugar,salads and grilled lean meats,some loss of appetite   What recent interventions/DTPs have been made to improve glycemic control: 04/21/21  the patient reports he stopped Glipizide as of this day. Increased Trulicity to 44m injection once a week and the patient injects on Saturday nights.  Have there been any recent hospitalizations or ED visits since last visit with CPP? No  Patient denies hypoglycemic symptoms, including Pale, Sweaty, Shaky, Hungry, Nervous/irritable, and Vision changes  Patient denies hyperglycemic symptoms, including blurry vision, excessive thirst, fatigue, polyuria, and weakness  How often are you checking your blood sugar? twice daily  What are your blood sugars ranging?  Fasting: 05/31/21 137          05/30/21 127          05/29/21 150          05/28/21 116      After meals:  6:00pm  05/28/21   158         05/27/21   160         05/26/21   140   During the week, how often does your blood glucose drop below 70? Never  Are you checking your feet daily/regularly? Yes  Adherence Review: Is the patient currently on a STATIN medication? Yes Is the patient currently on ACE/ARB medication? Yes Does the patient have >5 day gap between last estimated fill dates? No  Care Gaps: Last annual wellness visit: 10/18/20 Last eye exam / retinopathy screening:05/10/21 Last diabetic foot exam: 10/18/20  Counseled patient on importance of annual eye and foot exam.   Star Rating Drugs:  Medication:  Last Fill: Day Supply Atorvastatin 216XW59/60/4590 Trulicity   Gets from PAP Losartan 243m7/24/22 30  The patient uses [facility] for Losartan due to resuming the medication recently. The patient uses [facility] mail order for most other medications and the patient read the last bottle of atorvastatin he received - 03/17/21 The patient is 1 year now from Kidney transplant and is feeling very well, [facility].  No appointments scheduled within the next 30 days.  MiDebbora DusCPP notified  VeAvel SensorCCMorovisssistant [phone] have reviewed the care management and  care coordination activities outlined in this encounter and I am certifying that I agree with the content of this note. No further action required.  MiDebbora DusPharmD Clinical Pharmacist [phone]

## 2021-06-04 DIAGNOSIS — Z949 Transplanted organ and tissue status, unspecified: Secondary | ICD-10-CM | POA: Diagnosis not present

## 2021-06-05 DIAGNOSIS — N390 Urinary tract infection, site not specified: Secondary | ICD-10-CM | POA: Diagnosis not present

## 2021-06-05 DIAGNOSIS — R319 Hematuria, unspecified: Secondary | ICD-10-CM | POA: Diagnosis not present

## 2021-06-05 DIAGNOSIS — Z940 Kidney transplant status: Secondary | ICD-10-CM | POA: Diagnosis not present

## 2021-06-12 DIAGNOSIS — Z940 Kidney transplant status: Secondary | ICD-10-CM | POA: Diagnosis not present

## 2021-06-12 DIAGNOSIS — D849 Immunodeficiency, unspecified: Secondary | ICD-10-CM | POA: Diagnosis not present

## 2021-06-21 DIAGNOSIS — D84821 Immunodeficiency due to drugs: Secondary | ICD-10-CM | POA: Diagnosis not present

## 2021-06-21 DIAGNOSIS — E1122 Type 2 diabetes mellitus with diabetic chronic kidney disease: Secondary | ICD-10-CM | POA: Diagnosis not present

## 2021-06-21 DIAGNOSIS — I120 Hypertensive chronic kidney disease with stage 5 chronic kidney disease or end stage renal disease: Secondary | ICD-10-CM | POA: Diagnosis not present

## 2021-06-21 DIAGNOSIS — I10 Essential (primary) hypertension: Secondary | ICD-10-CM | POA: Diagnosis not present

## 2021-06-21 DIAGNOSIS — Z949 Transplanted organ and tissue status, unspecified: Secondary | ICD-10-CM | POA: Diagnosis not present

## 2021-06-21 DIAGNOSIS — Z7952 Long term (current) use of systemic steroids: Secondary | ICD-10-CM | POA: Diagnosis not present

## 2021-06-21 DIAGNOSIS — Z5181 Encounter for therapeutic drug level monitoring: Secondary | ICD-10-CM | POA: Diagnosis not present

## 2021-06-21 DIAGNOSIS — N186 End stage renal disease: Secondary | ICD-10-CM | POA: Diagnosis not present

## 2021-06-21 DIAGNOSIS — T8619 Other complication of kidney transplant: Secondary | ICD-10-CM | POA: Diagnosis not present

## 2021-06-21 DIAGNOSIS — D849 Immunodeficiency, unspecified: Secondary | ICD-10-CM | POA: Diagnosis not present

## 2021-06-21 DIAGNOSIS — Z79899 Other long term (current) drug therapy: Secondary | ICD-10-CM | POA: Diagnosis not present

## 2021-06-21 DIAGNOSIS — Z940 Kidney transplant status: Secondary | ICD-10-CM | POA: Diagnosis not present

## 2021-07-04 DIAGNOSIS — E1122 Type 2 diabetes mellitus with diabetic chronic kidney disease: Secondary | ICD-10-CM | POA: Diagnosis not present

## 2021-07-04 DIAGNOSIS — Z5181 Encounter for therapeutic drug level monitoring: Secondary | ICD-10-CM | POA: Diagnosis not present

## 2021-07-04 DIAGNOSIS — N185 Chronic kidney disease, stage 5: Secondary | ICD-10-CM | POA: Diagnosis not present

## 2021-07-04 DIAGNOSIS — Z940 Kidney transplant status: Secondary | ICD-10-CM | POA: Diagnosis not present

## 2021-07-04 DIAGNOSIS — Z794 Long term (current) use of insulin: Secondary | ICD-10-CM | POA: Diagnosis not present

## 2021-07-17 DIAGNOSIS — Z5181 Encounter for therapeutic drug level monitoring: Secondary | ICD-10-CM | POA: Diagnosis not present

## 2021-07-17 DIAGNOSIS — Z940 Kidney transplant status: Secondary | ICD-10-CM | POA: Diagnosis not present

## 2021-07-19 DIAGNOSIS — Z940 Kidney transplant status: Secondary | ICD-10-CM | POA: Diagnosis not present

## 2021-07-19 DIAGNOSIS — Z79899 Other long term (current) drug therapy: Secondary | ICD-10-CM | POA: Diagnosis not present

## 2021-08-02 DIAGNOSIS — I10 Essential (primary) hypertension: Secondary | ICD-10-CM | POA: Diagnosis not present

## 2021-08-02 DIAGNOSIS — Z940 Kidney transplant status: Secondary | ICD-10-CM | POA: Diagnosis not present

## 2021-08-02 DIAGNOSIS — E1122 Type 2 diabetes mellitus with diabetic chronic kidney disease: Secondary | ICD-10-CM | POA: Diagnosis not present

## 2021-08-02 DIAGNOSIS — I5032 Chronic diastolic (congestive) heart failure: Secondary | ICD-10-CM | POA: Diagnosis not present

## 2021-08-02 DIAGNOSIS — R600 Localized edema: Secondary | ICD-10-CM | POA: Diagnosis not present

## 2021-08-02 DIAGNOSIS — R001 Bradycardia, unspecified: Secondary | ICD-10-CM | POA: Diagnosis not present

## 2021-08-02 DIAGNOSIS — R0602 Shortness of breath: Secondary | ICD-10-CM | POA: Diagnosis not present

## 2021-08-02 DIAGNOSIS — Z23 Encounter for immunization: Secondary | ICD-10-CM | POA: Diagnosis not present

## 2021-08-02 DIAGNOSIS — E7800 Pure hypercholesterolemia, unspecified: Secondary | ICD-10-CM | POA: Diagnosis not present

## 2021-08-14 DIAGNOSIS — Z940 Kidney transplant status: Secondary | ICD-10-CM | POA: Diagnosis not present

## 2021-08-24 ENCOUNTER — Other Ambulatory Visit: Payer: Self-pay

## 2021-08-24 DIAGNOSIS — N138 Other obstructive and reflux uropathy: Secondary | ICD-10-CM

## 2021-08-25 ENCOUNTER — Other Ambulatory Visit: Payer: Medicare HMO

## 2021-08-25 ENCOUNTER — Other Ambulatory Visit: Payer: Self-pay

## 2021-08-25 DIAGNOSIS — N138 Other obstructive and reflux uropathy: Secondary | ICD-10-CM | POA: Diagnosis not present

## 2021-08-25 DIAGNOSIS — N401 Enlarged prostate with lower urinary tract symptoms: Secondary | ICD-10-CM | POA: Diagnosis not present

## 2021-08-26 LAB — PSA: Prostate Specific Ag, Serum: 0.5 ng/mL (ref 0.0–4.0)

## 2021-08-28 ENCOUNTER — Other Ambulatory Visit: Payer: Self-pay | Admitting: Urology

## 2021-08-28 DIAGNOSIS — N401 Enlarged prostate with lower urinary tract symptoms: Secondary | ICD-10-CM

## 2021-08-29 ENCOUNTER — Telehealth: Payer: Self-pay

## 2021-08-29 NOTE — Chronic Care Management (AMB) (Addendum)
Chronic Care Management Pharmacy Assistant   Name: Richard Chen  MRN: [id number] DOB: 14-Nov-1955  Reason for Encounter: Reminder Call   Conditions to be addressed/monitored: HTN, HLD, and DMII   Medications: Outpatient Encounter Medications as of 08/29/2021  Medication Sig   Accu-Chek Softclix Lancets lancets Use to obtain blood sugar dx code E11.29   acetaminophen (TYLENOL) 500 MG tablet Take 500 mg by mouth every 6 (six) hours as needed (pain).    Alcohol Swabs (ALCOHOL PREP) PADS 1 Units by Does not apply route daily.   allopurinol (ZYLOPRIM) 300 MG tablet TAKE 1 TABLET EVERY DAY (Patient taking differently: Take 100 mg by mouth daily.)   amLODipine (NORVASC) 10 MG tablet Take 1 tablet (10 mg total) by mouth daily.   atorvastatin (LIPITOR) 20 MG tablet Take 1 tablet (20 mg total) by mouth daily.   Blood Glucose Monitoring Suppl (ACCU-CHEK GUIDE) w/Device KIT USE AS DIRECTED   carvedilol (COREG) 12.5 MG tablet Take 6.25 mg by mouth 2 (two) times daily with a meal.   Cholecalciferol (VITAMIN D3) 50 MCG (2000 UT) CAPS Take by mouth.   citalopram (CELEXA) 20 MG tablet TAKE 1 TABLET EVERY DAY   docusate sodium (COLACE) 100 MG capsule Take 100 mg by mouth 2 (two) times daily. Dok Plus BID   Dulaglutide (TRULICITY) 1.5 IP/3.8SN SOPN Inject 3 mg into the skin once a week.   fexofenadine (ALLEGRA) 180 MG tablet Take 180 mg daily by mouth.   fluticasone (FLONASE) 50 MCG/ACT nasal spray USE 2 SPRAYS IN EACH NOSTRIL EVERY DAY   glucose blood (ACCU-CHEK GUIDE) test strip Use to check blood sugar once a day Dx Code E11.29   hydrALAZINE (APRESOLINE) 50 MG tablet Take 1 tablet (50 mg total) by mouth every 8 (eight) hours. (Patient taking differently: Take 50 mg by mouth every 8 (eight) hours. 1/2 tablet (25 mg) TID)   losartan (COZAAR) 25 MG tablet Take 25 mg by mouth daily.   mycophenolate (CELLCEPT) 250 MG capsule Take 750 mg by mouth. Take 3 in the morning and 2 at bedtime   predniSONE  (DELTASONE) 5 MG tablet 5 mg daily.   senna-docusate (SENOKOT-S) 8.6-50 MG tablet Take by mouth 2 (two) times daily as needed.   sulfamethoxazole-trimethoprim (BACTRIM DS) 800-160 MG tablet 1 tablet Monday, Wednesday, Friday only   tacrolimus (PROGRAF) 1 MG capsule 5 tablets qAM and 4 tablets qPM changed 12/20/20   tamsulosin (FLOMAX) 0.4 MG CAPS capsule TAKE 1 CAPSULE EVERY DAY   No facility-administered encounter medications on file as of 08/29/2021.   Richard Chen  returned phone call to reschedule  his upcoming telephone visit with Richard Chen  to 09/12/21 at 4:00pm Patient was reminded to have all medications, supplements and any blood glucose and blood pressure readings available for review at appointment.  Star Rating Drugs: Medication:  Last Fill: Day Supply Atorvastatin 20mg  0/53/97 90 Trulicity  -  - Losartan 25mg  -  -  Patient unavailable to discuss adherence with medications, he uses [facility] order.  The wife called to reschedule appointment and we found another date that works for him.  Richard Chen, CPP notified  Richard Chen, [place] Assistant [phone]  CPA: 10 min.   I have reviewed the care management and care coordination activities outlined in this encounter and I am certifying that I agree with the content of this note. No further action required.  Richard Chen, PharmD Clinical Pharmacist [facility] at [facility]  [phone]  
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
## 2021-08-31 ENCOUNTER — Other Ambulatory Visit: Payer: Self-pay

## 2021-08-31 ENCOUNTER — Ambulatory Visit: Payer: Medicare HMO | Admitting: Urology

## 2021-08-31 ENCOUNTER — Encounter: Payer: Self-pay | Admitting: Urology

## 2021-08-31 VITALS — BP 149/73 | HR 67 | Ht 73.0 in | Wt 240.0 lb

## 2021-08-31 DIAGNOSIS — N138 Other obstructive and reflux uropathy: Secondary | ICD-10-CM

## 2021-08-31 DIAGNOSIS — R399 Unspecified symptoms and signs involving the genitourinary system: Secondary | ICD-10-CM | POA: Diagnosis not present

## 2021-08-31 DIAGNOSIS — R319 Hematuria, unspecified: Secondary | ICD-10-CM | POA: Diagnosis not present

## 2021-08-31 DIAGNOSIS — N5201 Erectile dysfunction due to arterial insufficiency: Secondary | ICD-10-CM | POA: Diagnosis not present

## 2021-08-31 LAB — BLADDER SCAN AMB NON-IMAGING

## 2021-08-31 MED ORDER — SILDENAFIL CITRATE 100 MG PO TABS: 100.0000 mg | ORAL_TABLET | Freq: Every day | ORAL | 3 refills | Status: DC | PRN

## 2021-09-04 ENCOUNTER — Telehealth: Payer: Medicare HMO

## 2021-09-04 DIAGNOSIS — Z940 Kidney transplant status: Secondary | ICD-10-CM | POA: Diagnosis not present

## 2021-09-12 ENCOUNTER — Other Ambulatory Visit: Payer: Self-pay

## 2021-09-12 ENCOUNTER — Ambulatory Visit (INDEPENDENT_AMBULATORY_CARE_PROVIDER_SITE_OTHER): Payer: Medicare HMO

## 2021-09-12 DIAGNOSIS — E1121 Type 2 diabetes mellitus with diabetic nephropathy: Secondary | ICD-10-CM

## 2021-09-12 DIAGNOSIS — I10 Essential (primary) hypertension: Secondary | ICD-10-CM

## 2021-09-12 DIAGNOSIS — E785 Hyperlipidemia, unspecified: Secondary | ICD-10-CM

## 2021-09-12 NOTE — Progress Notes (Signed)
Chronic Care Management Pharmacy Note  09/12/2021 Name:  Richard Chen MRN:  [id number] DOB:  [birth year]  Summary: Reviewed medication list, updated per patient report. He has been taken off all BP meds except carvedilol. Discussed chronic health conditions. HTN, home BP is controlled on carvedilol only. No further hypotension. HLD, due for lipid panel update. DM controlled A1c 7%. Home readings within goal. Need to apply for Trulicity PAP renewal.  Recommendations: Recommend updating lipid panel at physical 10/27/20.   Plan: CCM 6 months Mail Trulicity application renewal forms  Subjective: Richard Chen is an 65 y.o. year old male who is a primary patient of Letvak, Theophilus Kinds, MD.  The CCM team was consulted for assistance with disease management and care coordination needs.    Engaged with patient by telephone for follow up visit in response to provider referral for pharmacy case management and/or care coordination services.   Consent to Services:  The patient was given information about Chronic Care Management services, agreed to services, and gave verbal consent prior to initiation of services.  Please see initial visit note for detailed documentation.   Patient Care Team: Venia Carbon, MD as PCP - Claris Gower, [facility] as Pharmacist (Pharmacist)  Recent office visits: 04/20/21 Silvio Pate, PCP - Pt presented for diabetes and CHF follow up. Improving. Follow up 6 months.   Recent consult visits: 08/31/21 - Urology - Pt presented for BPH follow up. Start sildenafil 100 mg PRN for ED. Continue tamsulosin 0.4 mg daily. Follow up 1 year.  08/02/21 - Cardiology - Pt presented for Diastolic CHF and HTN follow up. Resume carvedilol 3.125 mg p.o. twice daily. 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 Follow up 3 months. 07/19/21 - Infusion therapy - Pt presented for Evusheld injection.  06/21/21 - Transplant team - Pt presented for s/p kidney transplant follow up. Stop Norvasc at this time. Continue to check your vitals twice per day- call if top blood pressure is less than 115 or more than 140, dizzy, lightheaded. Please maintain follow-up primary care, Endocrinology, cardiology. Dermatology consult placed. Follow up 3 months.  Hospital visits: None in previous 6 months  Objective:  Lab Results  Component Value Date   CREATININE 6.66 (H) 05/09/2020   BUN 37 (H) 05/09/2020   GFR 17.94 (L) 03/05/2016   GFRNONAA 8 (L) 05/09/2020   GFRAA 9 (L) 05/09/2020   NA 136 05/09/2020   K 3.7 05/09/2020   CALCIUM 8.1 (L) 05/09/2020   CO2 30 05/09/2020   GLUCOSE 151 (H) 05/09/2020    Lab Results  Component Value Date/Time   HGBA1C 7.0 (A) 04/20/2021 02:47 PM   HGBA1C 7.6 (A) 10/18/2020 10:07 AM   HGBA1C 6.2 (H) 02/07/2020 07:23 AM   HGBA1C 6.6 (H) 10/13/2019 10:00 AM   GFR 17.94 (L) 03/05/2016 12:39 PM   GFR 53.30 (L) 02/18/2015 12:25 PM   MICROALBUR 37.6 (H) 10/09/2011 10:18 AM   MICROALBUR 8.1 (H) 06/28/2009 09:24 AM    Last diabetic Eye exam:  Lab Results  Component Value Date/Time   HMDIABEYEEXA No Retinopathy 05/10/2020 12:00 AM    Last diabetic Foot exam:  Lab Results  Component Value Date/Time   HMDIABFOOTEX done 10/18/2020 12:00 AM     Lab Results  Component Value Date   CHOL 133 10/13/2019   HDL 42.90  10/13/2019   LDLCALC 65 10/13/2019   LDLDIRECT 166.5 10/26/2013   TRIG 125.0 10/13/2019   CHOLHDL 3 10/13/2019    Hepatic Function Latest Ref Rng & Units 05/09/2020 02/07/2020 10/13/2019  Total Protein 6.5 - 8.1 g/dL 7.9 7.6 6.8  Albumin 3.5 - 5.0 g/dL 4.1 3.7 3.7  AST 15 - 41 U/L _0 ALT 0 - 44 U/L _1 Alk Phosphatase 38 - 126 U/L 74 76 72  Total Bilirubin 0.3 - 1.2 mg/dL 0.7 0.8 0.3  Bilirubin, Direct 0.0 - 0.3 mg/dL - - 0.1    Lab  Results  Component Value Date/Time   TSH 1.79 03/26/2018 09:37 AM   TSH 1.10 07/14/2014 11:43 AM   FREET4 1.02 10/13/2019 10:00 AM   FREET4 1.04 03/26/2018 09:37 AM    CBC Latest Ref Rng & Units 05/09/2020 02/07/2020 10/03/2019  WBC 4.0 - 10.5 K/uL 7.7 6.6 6.8  Hemoglobin 13.0 - 17.0 g/dL 11.1(L) 11.4(L) 10.1(L)  Hematocrit 39.0 - 52.0 % 33.7(L) 37.0(L) 32.2(L)  Platelets 150 - 400 K/uL 146(L) 161 152    No results found for: VD25OH  Clinical ASCVD: Yes  The 10-year ASCVD risk score (Arnett DK, et al., 2019) is: 38.1%   Values used to calculate the score:     Age: 1 years     Sex: Male     Is Non-Hispanic African American: Yes     Diabetic: Yes     Tobacco smoker: No     Systolic Blood Pressure: 779 mmHg     Is BP treated: Yes     HDL Cholesterol: 46 mg/dL     Total Cholesterol: 181 mg/dL    Social History   Tobacco Use  Smoking Status Never  Smokeless Tobacco Never   BP Readings from Last 3 Encounters:  08/31/21 (!) 149/73  04/20/21 108/62  10/18/20 116/64   Pulse Readings from Last 3 Encounters:  08/31/21 67  04/20/21 66  10/18/20 (!) 53   Wt Readings from Last 3 Encounters:  08/31/21 240 lb (108.9 kg)  04/20/21 245 lb 12 oz (111.5 kg)  10/18/20 240 lb (108.9 kg)   BMI Readings from Last 3 Encounters:  08/31/21 31.66 kg/m  04/20/21 33.33 kg/m  10/18/20 32.10 kg/m    Assessment/Interventions: Review of patient past medical history, allergies, medications, health status, including review of consultants reports, laboratory and other test data, was performed as part of comprehensive evaluation and provision of chronic care management services.   SDOH:  (Social Determinants of Health) assessments and interventions performed: Yes SDOH Interventions    Flowsheet Row Most Recent Value  SDOH Interventions   Financial Strain Interventions Other (Comment)  [Renew assistance for Trulicity 3 mg for 3903]       SDOH Screenings   Alcohol Screen: Not on file   Depression (PHQ2-9): Not on file  Financial Resource Strain: High Risk   Difficulty of Paying Living Expenses: Hard  Food Insecurity: Not on file  Housing: Not on file  Physical Activity: Not on file  Social Connections: Not on file  Stress: Not on file  Tobacco Use: Low Risk    Smoking Tobacco Use: Never   Smokeless Tobacco Use: Never   Passive Exposure: Not on file  Transportation Needs: Not on file    CCM Care Plan  Allergies  Allergen Reactions   Aspirin Anaphylaxis   Shellfish Allergy Anaphylaxis   Clonidine Other (See Comments)    Gait imbalance/ dysfunction requiring wheelchair.  Increased  lethargy.    Other Hives    Dye when viewed kidney (break out in knots)   Contrast Media [Iodinated Diagnostic Agents] Rash and Hives    Medications Reviewed Today     Reviewed by Debbora Dus, [facility] (Pharmacist) on 09/12/21 at 1613  Med List Status: <None>   Medication Order Taking? Sig Documenting Provider Last Dose Status Informant  Accu-Chek Softclix Lancets lancets [id number] Yes Use to obtain blood sugar dx code E11.29 Venia Carbon, MD Taking Active   acetaminophen (TYLENOL) 500 MG tablet [id number] Yes Take 500 mg by mouth every 6 (six) hours as needed (pain).  [provider] Taking Active Spouse/Significant Other  Alcohol Swabs (ALCOHOL PREP) PADS [id number] Yes 1 Units by Does not apply route daily. Venia Carbon, MD Taking Active Spouse/Significant Other  allopurinol (ZYLOPRIM) 300 MG tablet [id number] Yes TAKE 1 TABLET EVERY DAY  Patient taking differently: Take 100 mg by mouth daily.   Venia Carbon, MD Taking Active   atorvastatin (LIPITOR) 20 MG tablet [id number] Yes Take 1 tablet (20 mg total) by mouth daily. Venia Carbon, MD Taking Active   Blood Glucose Monitoring Suppl (ACCU-CHEK GUIDE) w/Device Drucie Opitz [id number] Yes USE AS DIRECTED Venia Carbon, MD Taking Active   carvedilol (COREG) 12.5 MG tablet [id number] Yes Take 6.25 mg by mouth 2  (two) times daily with a meal. [provider] Taking Active   Cholecalciferol (VITAMIN D3) 50 MCG (2000 UT) CAPS [id number] Yes Take by mouth. [provider] Taking Active   citalopram (CELEXA) 20 MG tablet [id number] Yes TAKE 1 TABLET EVERY DAY Venia Carbon, MD Taking Active   Dulaglutide (TRULICITY) 1.5 YB/0.1BP SOPN [id number] Yes Inject 3 mg into the skin once a week. [provider] Taking Active   famotidine (PEPCID) 20 MG tablet [id number] Yes Take 20 mg by mouth daily. [provider] Taking Active   fexofenadine (ALLEGRA) 180 MG tablet [id number] Yes Take 180 mg daily by mouth. [provider] Taking Active Spouse/Significant Other  fluticasone (FLONASE) 50 MCG/ACT nasal spray [id number] Yes USE 2 SPRAYS IN EACH NOSTRIL EVERY DAY Venia Carbon, MD Taking Active   glucose blood (ACCU-CHEK GUIDE) test strip [id number] Yes Use to check blood sugar once a day Dx Code E11.29 Venia Carbon, MD Taking Active   mycophenolate (CELLCEPT) 250 MG capsule [id number] Yes Take 250 mg by mouth. Take 1 in the morning and 1 at bedtime (08/18/21) [provider] Taking Active Self  omeprazole (PRILOSEC) 20 MG capsule [id number] Yes Take 20 mg by mouth daily. [provider] Taking Active   predniSONE (DELTASONE) 5 MG tablet [id number] Yes 5 mg daily. [provider] Taking Active   senna-docusate (SENOKOT-S) 8.6-50 MG tablet [id number] Yes Take by mouth 2 (two) times daily as needed. [provider] Taking Active   sildenafil (VIAGRA) 100 MG tablet [id number] Yes Take 1 tablet (100 mg total) by mouth daily as needed for erectile dysfunction. Take two hours prior to intercourse on an empty stomach McGowan, Larene [place] A, PA-C Taking Active   tacrolimus (PROGRAF) 1 MG capsule [id number] Yes 4 tablets qAM and 4 tablets qPM changed 07/06/21 [provider] Taking Active Self  tamsulosin (FLOMAX) 0.4 MG CAPS capsule [id number] Yes  TAKE 1 CAPSULE EVERY DAY McGowan, Hunt Oris, PA-C Taking Active             Patient Active Problem List   Diagnosis Date Noted   Kidney transplant status 10/18/2020   BPH without  obstruction/lower urinary tract symptoms 10/18/2020   History of anemia due to chronic kidney disease 02/07/2020   Aortic atherosclerosis ([place]) 10/13/2019   Abdominal wall bulge 08/28/2017   Sinus bradycardia 02/06/2017   Pure hypercholesterolemia 06/21/2016   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[place]) 07/14/2014   Mood disorder ([place]) 01/12/2013   Routine general medical examination at a health care facility 10/14/2012   Asthma 06/08/2012   Obesity 06/08/2012   Gout 01/06/2010   Chronic diastolic heart failure ([place]) 11/18/2009   ERECTILE DYSFUNCTION, ORGANIC 02/09/2008   Type 2 diabetes mellitus with renal manifestations, controlled ([place]) 09/29/2007   HLD (hyperlipidemia) 06/20/2007   Essential hypertension 06/20/2007   ALLERGIC RHINITIS 06/20/2007    Immunization History  Administered Date(s) Administered   Hepatitis B, adult 09/25/2016, 10/23/2016, 11/23/2016, 03/22/2017, 05/22/2017, 06/22/2017, 06/27/2017, 07/24/2017, 08/24/2017, 11/27/2017, 08/20/2018   Influenza Split 07/18/2011, 08/22/2012   Influenza Whole 07/24/2007, 07/26/2008, 08/25/2009   Influenza, High Dose Seasonal PF 07/14/2018, 09/01/2020   Influenza, Seasonal, Injecte, Preservative Fre 07/04/2016   Influenza,inj,Quad PF,6+ Mos 07/16/2013, 07/14/2014, 08/23/2015   Influenza-Unspecified 08/06/2017, 07/22/2018   Moderna Sars-Covid-2 Vaccination 11/27/2019, 12/25/2019, 02/22/2021   PPD Test 03/26/2016, 04/09/2016, 03/22/2017, 04/04/2018, 04/03/2019, 03/25/2020   Pneumococcal Conjugate-13 04/05/2016   Pneumococcal Polysaccharide-23 10/09/2011, 04/13/2016, 03/06/2017   Td 06/28/2009   Tdap 12/08/2020   Zoster Recombinat (Shingrix) 12/31/2018, 05/14/2019   Zoster, Live 03/12/2016    Conditions to be  addressed/monitored:  Hypertension, Hyperlipidemia, and Diabetes  Care Plan : [place]  Updates made by Debbora Dus, [place] since 09/18/2021 12:00 AM     Problem: CHL AMB "PATIENT-SPECIFIC PROBLEM"      Long-Range Goal: [place]   Start Date: 02/23/2021  This Visit's Progress: On track  Priority: High  Note:   Current Barriers:  Due for annual lipid panel   Pharmacist Clinical Goal(s): Patient will contact provider office for questions/concerns as evidenced notation of same in electronic health record through collaboration with PharmD and provider.   Interventions: 1:1 collaboration with Venia Carbon, MD regarding development and update of comprehensive plan of care as evidenced by provider attestation and co-signature Inter-disciplinary care team collaboration (see longitudinal plan of care) Comprehensive medication review performed; medication list updated in electronic medical record  Hypertension (BP goal <140/90) -Controlled - patient has been taken off all BP medication per transplant team due to hypotension/dizziness . Cardiology resumed carvedilol with plan for 3 month follow up 10/2021 -Current treatment: Carvedilol 6.25 mg - 1 tablet twice daily (9 AM and 9 PM) -Medications previously tried: hydralazine, losartan, amlodipine -Pt's BP is closely following by transplant team and cardiology. -Pt has a home BP cuff, verified accurate with [facility]  -Checks home BP in left arm, seated, with feet flat on the floor, after resting 5 minutes -Current home readings: 133/75, 68(11/22 morning); 168/81, 63 (last night); 127/77, 74 (11/22 morning) -Current dietary habits:  Caffeine: Drinks diet coke in the morning after checks BP Sodium: does not add salt OTCs - Denies NSAIDs or pseudoephedrine, just Allegra  -Current exercise habits: minimal -Denies hypotensive/hypertensive symptoms -Educated on BP goals and benefits of medications for prevention of  heart attack, stroke and kidney damage; Proper BP monitoring technique; -Counseled to monitor BP at home daily, document, and provide log at future appointments -Recommended to schedule follow up with cardiology   Hyperlipidemia: (LDL goal < 70) -Controlled - LDL 65 (past due for update on lipid panel) -Current treatment: Atorvastatin 20 mg - 1 tablet daily (9  PM) -Medications previously tried: none -Educated on Cholesterol goals;  -Recommended to continue current medication; Recommend updating lipid panel at [facility] 10/27/20.  Diabetes (A1c goal <7%) -Controlled - A1c 7.0% (improved 03/2021) -Followed by Dr. Ronny Flurry -Current medications: Trulicity 3 mg - Inject 3 mg once weekly  Glipizide 10 mg - 1/2 tablet (5 mg) daily in the morning (started back October 2022) -Medications previously tried: insulin   -Current home glucose readings - checks twice daily 9 AM and 6 PM Fasting glucose: 11/22 - 146 11/21 - 122  11/20 - 143 11/19 - 136 Before supper: 172, 188, 203 -Denies hypoglycemic/hyperglycemic symptoms -Diet - reports snacks on little debbie, nekot crackers have resulted in higher readings before supper -Educated on A1c and blood sugar goals; -Counseled to check feet daily and get yearly eye exams - up to date -Recommended to continue current medication; Try to replace snacks with low carb alternatives such as nuts, cheese, sugar free yogurt, or small apple.   Patient Goals/Self-Care Activities Patient will:  - take medications as prescribed  Follow Up Plan: Telephone follow up appointment with care management team member scheduled for: 6 months      Medication Assistance:  We need to apply for renewal of assistance for Trulicity for 7445. Will mail forms to patient.   Patient's preferred pharmacy is: [facility] [address] [place] - [address] [place] [address] Phone: [phone] Fax: [facility] - [place], [place] [address] Phone: [phone] Fax: [phone]  Maintenance medications through [facility]  Uses pill box? Yes Pt endorses 100% compliance  Care Plan and Follow Up Patient Decision:  Patient agrees to Care Plan and Follow-up.  Debbora Dus, PharmD Clinical Pharmacist [facility] at [facility] [phone]

## 2021-09-18 ENCOUNTER — Telehealth: Payer: Self-pay

## 2021-09-18 NOTE — Telephone Encounter (Signed)
Patient needs to renew application for Trulicity 1.5 mg assistance. Can you please contact patient to confirm dose (1.5 mg or 3 mg) weekly.   Prescribed by: Marvene Staff, NP   [address]   [place], [place] [address]   [phone] (Work)     Please complete and send back to me for review/mail.  Debbora Dus, PharmD Clinical Pharmacist [facility] at [facility] [phone]

## 2021-09-18 NOTE — Progress Notes (Addendum)
Spoke with patient. He confirmed he is taking Trulicity 3 mg weekly. Patient would like his patient assistance renewal form for Trulicity mailed to his home. Patient stated he will then mail forms back to office when completed. Patient assistance forms have been completed and uploaded.  Debbora Dus, CPP notified  Marijean Niemann, [place] Clinical Pharmacy Assistant [phone]  Time Spent: 30 Minutes

## 2021-09-18 NOTE — Patient Instructions (Signed)
Dear Morrell Riddle,  Below is a summary of the goals we discussed during our follow up appointment on September 12, 2021. Please contact me anytime with questions or concerns.   Visit Information Patient Care Plan: CCM Pharmacy Care Plan     Problem Identified: CHL AMB "PATIENT-SPECIFIC PROBLEM"      Long-Range Goal: [place]   Start Date: 02/23/2021  This Visit's Progress: On track  Priority: High  Note:   Current Barriers:  Due for annual lipid panel   Pharmacist Clinical Goal(s): Patient will contact provider office for questions/concerns as evidenced notation of same in electronic health record through collaboration with PharmD and provider.   Interventions: 1:1 collaboration with Venia Carbon, MD regarding development and update of comprehensive plan of care as evidenced by provider attestation and co-signature Inter-disciplinary care team collaboration (see longitudinal plan of care) Comprehensive medication review performed; medication list updated in electronic medical record  Hypertension (BP goal <140/90) -Controlled - patient has been taken off all BP medication per transplant team due to hypotension/dizziness . Cardiology resumed carvedilol with plan for 3 month follow up 10/2021 -Current treatment: Carvedilol 6.25 mg - 1 tablet twice daily (9 AM and 9 PM) -Medications previously tried: hydralazine, losartan, amlodipine -Pt's BP is closely following by transplant team and cardiology. -Pt has a home BP cuff, verified accurate with [facility]  -Checks home BP in left arm, seated, with feet flat on the floor, after resting 5 minutes -Current home readings: 133/75, 68(11/22 morning); 168/81, 63 (last night); 127/77, 74 (11/22 morning) -Current dietary habits:  Caffeine: Drinks diet coke in the morning after checks BP Sodium: does not add salt OTCs - Denies NSAIDs or pseudoephedrine, just Allegra  -Current exercise habits: minimal -Denies  hypotensive/hypertensive symptoms -Educated on BP goals and benefits of medications for prevention of heart attack, stroke and kidney damage; Proper BP monitoring technique; -Counseled to monitor BP at home daily, document, and provide log at future appointments -Recommended to schedule follow up with cardiology   Hyperlipidemia: (LDL goal < 70) -Controlled - LDL 65 (past due for update on lipid panel) -Current treatment: Atorvastatin 20 mg - 1 tablet daily (9 PM) -Medications previously tried: none -Educated on Cholesterol goals;  -Recommended to continue current medication; Recommend updating lipid panel at [facility] 10/27/20.  Diabetes (A1c goal <7%) -Controlled - A1c 7.0% (improved 03/2021) -Followed by Dr. Ronny Flurry -Current medications: Trulicity 3 mg - Inject 3 mg once weekly  Glipizide 10 mg - 1/2 tablet (5 mg) daily in the morning (started back October 2022) -Medications previously tried: insulin   -Current home glucose readings - checks twice daily 9 AM and 6 PM Fasting glucose: 11/22 - 146 11/21 - 122  11/20 - 143 11/19 - 136 Before supper: 172, 188, 203 -Denies hypoglycemic/hyperglycemic symptoms -Diet - reports snacks on little debbie, nekot crackers have resulted in higher readings before supper -Educated on A1c and blood sugar goals; -Counseled to check feet daily and get yearly eye exams - up to date -Recommended to continue current medication; Try to replace snacks with low carb alternatives such as nuts, cheese, sugar free yogurt, or small apple.   Patient Goals/Self-Care Activities Patient will:  - take medications as prescribed  Follow Up Plan: Telephone follow up appointment with care management team member scheduled for: 6 months       Patient verbalizes understanding of instructions provided today and agrees to view in [place].   Debbora Dus, PharmD Clinical Pharmacist [facility]  at [place]

## 2021-09-19 ENCOUNTER — Other Ambulatory Visit: Payer: Self-pay | Admitting: Internal Medicine

## 2021-09-19 NOTE — Telephone Encounter (Signed)
Trulicity 3 mg PAP renewal forms have been reviewed and moved to print folder. Please print and mail to pt along with renewal instructions. Please attach a sticky note instructing patient to take or mail to Dr. Ronny Flurry once completed for signature.  Debbora Dus, PharmD Clinical Pharmacist [place] [facility] at [facility] [phone]

## 2021-09-20 DIAGNOSIS — Z940 Kidney transplant status: Secondary | ICD-10-CM | POA: Diagnosis not present

## 2021-09-20 DIAGNOSIS — Z794 Long term (current) use of insulin: Secondary | ICD-10-CM | POA: Diagnosis not present

## 2021-09-20 DIAGNOSIS — E119 Type 2 diabetes mellitus without complications: Secondary | ICD-10-CM | POA: Diagnosis not present

## 2021-09-20 DIAGNOSIS — Z6831 Body mass index (BMI) 31.0-31.9, adult: Secondary | ICD-10-CM | POA: Diagnosis not present

## 2021-09-20 DIAGNOSIS — E1121 Type 2 diabetes mellitus with diabetic nephropathy: Secondary | ICD-10-CM | POA: Diagnosis not present

## 2021-09-20 DIAGNOSIS — Z4822 Encounter for aftercare following kidney transplant: Secondary | ICD-10-CM | POA: Diagnosis not present

## 2021-09-20 DIAGNOSIS — I10 Essential (primary) hypertension: Secondary | ICD-10-CM

## 2021-09-20 DIAGNOSIS — Z7984 Long term (current) use of oral hypoglycemic drugs: Secondary | ICD-10-CM | POA: Diagnosis not present

## 2021-09-20 DIAGNOSIS — I132 Hypertensive heart and chronic kidney disease with heart failure and with stage 5 chronic kidney disease, or end stage renal disease: Secondary | ICD-10-CM | POA: Diagnosis not present

## 2021-09-20 DIAGNOSIS — Z5181 Encounter for therapeutic drug level monitoring: Secondary | ICD-10-CM | POA: Diagnosis not present

## 2021-09-20 DIAGNOSIS — D849 Immunodeficiency, unspecified: Secondary | ICD-10-CM | POA: Diagnosis not present

## 2021-09-20 DIAGNOSIS — E785 Hyperlipidemia, unspecified: Secondary | ICD-10-CM

## 2021-09-20 DIAGNOSIS — N186 End stage renal disease: Secondary | ICD-10-CM | POA: Diagnosis not present

## 2021-09-20 DIAGNOSIS — Z23 Encounter for immunization: Secondary | ICD-10-CM | POA: Diagnosis not present

## 2021-09-20 DIAGNOSIS — I509 Heart failure, unspecified: Secondary | ICD-10-CM | POA: Diagnosis not present

## 2021-09-20 NOTE — Progress Notes (Signed)
Patient assistance forms for Trulicity mailed to the patient with instructions to get the providers signature and then return to [facility] .  Debbora Dus, CPP notified  Avel Sensor, [place] Assistant [phone]  Total time spent for month CPA: 10 min.

## 2021-09-20 NOTE — Telephone Encounter (Signed)
[place], Dr. Gwendolyn Fill office can fax the application to [facility], it does not have to brought back to [facility]. Thanks!

## 2021-09-25 ENCOUNTER — Telehealth: Payer: Self-pay

## 2021-09-25 NOTE — Telephone Encounter (Signed)
Patient called to confirm he received PAP forms for Trulicity. He has completed and will mail to Dr. Ronny Flurry for signature and fax.

## 2021-10-04 DIAGNOSIS — Z940 Kidney transplant status: Secondary | ICD-10-CM | POA: Diagnosis not present

## 2021-10-04 DIAGNOSIS — Z5181 Encounter for therapeutic drug level monitoring: Secondary | ICD-10-CM | POA: Diagnosis not present

## 2021-10-10 DIAGNOSIS — N185 Chronic kidney disease, stage 5: Secondary | ICD-10-CM | POA: Diagnosis not present

## 2021-10-10 DIAGNOSIS — E1122 Type 2 diabetes mellitus with diabetic chronic kidney disease: Secondary | ICD-10-CM | POA: Diagnosis not present

## 2021-10-10 DIAGNOSIS — Z794 Long term (current) use of insulin: Secondary | ICD-10-CM | POA: Diagnosis not present

## 2021-10-20 ENCOUNTER — Encounter: Payer: Medicare HMO | Admitting: Internal Medicine

## 2021-10-27 ENCOUNTER — Encounter: Payer: Self-pay | Admitting: Internal Medicine

## 2021-10-27 ENCOUNTER — Other Ambulatory Visit: Payer: Self-pay

## 2021-10-27 ENCOUNTER — Ambulatory Visit (INDEPENDENT_AMBULATORY_CARE_PROVIDER_SITE_OTHER): Payer: Medicare HMO | Admitting: Internal Medicine

## 2021-10-27 VITALS — BP 124/74 | HR 72 | Temp 98.3°F | Ht 72.5 in | Wt 239.0 lb

## 2021-10-27 DIAGNOSIS — F39 Unspecified mood [affective] disorder: Secondary | ICD-10-CM | POA: Diagnosis not present

## 2021-10-27 DIAGNOSIS — I10 Essential (primary) hypertension: Secondary | ICD-10-CM

## 2021-10-27 DIAGNOSIS — Z940 Kidney transplant status: Secondary | ICD-10-CM

## 2021-10-27 DIAGNOSIS — I5032 Chronic diastolic (congestive) heart failure: Secondary | ICD-10-CM

## 2021-10-27 DIAGNOSIS — S069X0S Unspecified intracranial injury without loss of consciousness, sequela: Secondary | ICD-10-CM | POA: Diagnosis not present

## 2021-10-27 DIAGNOSIS — Z0000 Encounter for general adult medical examination without abnormal findings: Secondary | ICD-10-CM

## 2021-10-27 DIAGNOSIS — E1121 Type 2 diabetes mellitus with diabetic nephropathy: Secondary | ICD-10-CM

## 2021-10-27 DIAGNOSIS — I700 Atherosclerosis of aorta: Secondary | ICD-10-CM | POA: Diagnosis not present
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
## 2021-10-27 NOTE — Assessment & Plan Note (Signed)
Chronic dysthymia Continues on the citalopram Discussed exercise and social engagement--he has been reluctant because he fears COVID

## 2021-10-27 NOTE — Assessment & Plan Note (Signed)
I have personally reviewed the Medicare Annual Wellness questionnaire and have noted 1. The patient's medical and social history 2. Their use of alcohol, tobacco or illicit drugs 3. Their current medications and supplements 4. The patient's functional ability including ADL's, fall risks, home safety risks and hearing or visual             impairment. 5. Diet and physical activities 6. Evidence for depression or mood disorders  The patients weight, height, BMI and visual acuity have been recorded in the chart I have made referrals, counseling and provided education to the patient based review of the above and I have provided the pt with a written personalized care plan for preventive services.  I have provided you with a copy of your personalized plan for preventive services. Please take the time to review along with your updated medication list.  Due for colonoscopy now--they will contact Dr Gustavo Lah Recent PSA normal Discussed DASH eating and exercise Due for bivalent COVID Had flu vaccine
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
## 2021-10-27 NOTE — Assessment & Plan Note (Signed)
On imaging Is on the atorvastatin 

## 2021-10-27 NOTE — Assessment & Plan Note (Signed)
BP Readings from Last 3 Encounters:  10/27/21 124/74  08/31/21 (!) 149/73  04/20/21 108/62   Was taken off BP meds  Dr Saralyn Pilar restarted carvedilol

## 2021-10-27 NOTE — Assessment & Plan Note (Signed)
Compensated on the carvedilol

## 2021-10-27 NOTE — Assessment & Plan Note (Signed)
Continues on immunosuppression regimen

## 2021-10-27 NOTE — Patient Instructions (Addendum)
Please call Dr Gustavo Lah at [facility] about getting your colonoscopy scheduled.  DASH Eating Plan DASH stands for Dietary Approaches to Stop Hypertension. The DASH eating plan is a healthy eating plan that has been shown to: Reduce high blood pressure (hypertension). Reduce your risk for type 2 diabetes, heart disease, and stroke. Help with weight loss. What are tips for following this plan? Reading food labels Check food labels for the amount of salt (sodium) per serving. Choose foods with less than 5 percent of the Daily Value of sodium. Generally, foods with less than 300 milligrams (mg) of sodium per serving fit into this eating plan. To find whole grains, look for the word "whole" as the first word in the ingredient list. Shopping Buy products labeled as "low-sodium" or "no salt added." Buy fresh foods. Avoid canned foods and pre-made or frozen meals. Cooking Avoid adding salt when cooking. Use salt-free seasonings or herbs instead of table salt or sea salt. Check with your health care provider or pharmacist before using salt substitutes. Do not fry foods. Cook foods using healthy methods such as baking, boiling, grilling, roasting, and broiling instead. Cook with heart-healthy oils, such as olive, canola, avocado, soybean, or sunflower oil. Meal planning  Eat a balanced diet that includes: 4 or more servings of fruits and 4 or more servings of vegetables each day. Try to fill one-half of your plate with fruits and vegetables. 6-8 servings of whole grains each day. Less than 6 oz (170 g) of lean meat, poultry, or fish each day. A 3-oz (85-g) serving of meat is about the same size as a deck of cards. One egg equals 1 oz (28 g). 2-3 servings of low-fat dairy each day. One serving is 1 cup (237 mL). 1 serving of nuts, seeds, or beans 5 times each week. 2-3 servings of heart-healthy fats. Healthy fats called omega-3 fatty acids are found in foods such as walnuts, flaxseeds, fortified  milks, and eggs. These fats are also found in cold-water fish, such as sardines, salmon, and mackerel. Limit how much you eat of: Canned or prepackaged foods. Food that is high in trans fat, such as some fried foods. Food that is high in saturated fat, such as fatty meat. Desserts and other sweets, sugary drinks, and other foods with added sugar. Full-fat dairy products. Do not salt foods before eating. Do not eat more than 4 egg yolks a week. Try to eat at least 2 vegetarian meals a week. Eat more home-cooked food and less restaurant, buffet, and fast food. Lifestyle When eating at a restaurant, ask that your food be prepared with less salt or no salt, if possible. If you drink alcohol: Limit how much you use to: 0-1 drink a day for women who are not pregnant. 0-2 drinks a day for men. Be aware of how much alcohol is in your drink. In the [place] one drink equals one 12 oz bottle of beer (355 mL), one 5 oz glass of wine (148 mL), or one 1 oz glass of hard liquor (44 mL). General information Avoid eating more than 2,300 mg of salt a day. If you have hypertension, you may need to reduce your sodium intake to 1,500 mg a day. Work with your health care provider to maintain a healthy body weight or to lose weight. Ask what an ideal weight is for you. Get at least 30 minutes of exercise that causes your heart to beat faster (aerobic exercise) most days of the week. Activities may include  walking, swimming, or biking. Work with your health care provider or dietitian to adjust your eating plan to your individual calorie needs. What foods should I eat? Fruits All fresh, dried, or frozen fruit. Canned fruit in natural juice (without added sugar). Vegetables Fresh or frozen vegetables (raw, steamed, roasted, or grilled). Low-sodium or reduced-sodium tomato and vegetable juice. Low-sodium or reduced-sodium tomato sauce and tomato paste. Low-sodium or reduced-sodium canned  vegetables. Grains Whole-grain or whole-wheat bread. Whole-grain or whole-wheat pasta. Brown rice. [place]. Bulgur. Whole-grain and low-sodium cereals. Pita bread. Low-fat, low-sodium crackers. Whole-wheat flour tortillas. Meats and other proteins Skinless chicken or [place]. Ground chicken or [place]. Pork with fat trimmed off. Fish and seafood. Egg whites. Dried beans, peas, or lentils. Unsalted nuts, nut butters, and seeds. Unsalted canned beans. Lean cuts of beef with fat trimmed off. Low-sodium, lean precooked or cured meat, such as sausages or meat loaves. Dairy Low-fat (1%) or fat-free (skim) milk. Reduced-fat, low-fat, or fat-free cheeses. Nonfat, low-sodium ricotta or cottage cheese. Low-fat or nonfat yogurt. Low-fat, low-sodium cheese. Fats and oils Soft margarine without trans fats. Vegetable oil. Reduced-fat, low-fat, or light mayonnaise and salad dressings (reduced-sodium). Canola, safflower, olive, avocado, soybean, and sunflower oils. Avocado. Seasonings and condiments Herbs. Spices. Seasoning mixes without salt. Other foods Unsalted popcorn and pretzels. Fat-free sweets. The items listed above may not be a complete list of foods and beverages you can eat. Contact a dietitian for more information. What foods should I avoid? Fruits Canned fruit in a light or heavy syrup. Fried fruit. Fruit in cream or butter sauce. Vegetables Creamed or fried vegetables. Vegetables in a cheese sauce. Regular canned vegetables (not low-sodium or reduced-sodium). Regular canned tomato sauce and paste (not low-sodium or reduced-sodium). Regular tomato and vegetable juice (not low-sodium or reduced-sodium). Angie Fava. Olives. Grains Baked goods made with fat, such as croissants, muffins, or some breads. Dry pasta or rice meal packs. Meats and other proteins Fatty cuts of meat. Ribs. Fried meat. Berniece Salines. Bologna, salami, and other precooked or cured meats, such as sausages or meat loaves. Fat from  the back of a pig (fatback). Bratwurst. Salted nuts and seeds. Canned beans with added salt. Canned or smoked fish. Whole eggs or egg yolks. Chicken or [place] with skin. Dairy Whole or 2% milk, cream, and half-and-half. Whole or full-fat cream cheese. Whole-fat or sweetened yogurt. Full-fat cheese. Nondairy creamers. Whipped toppings. Processed cheese and cheese spreads. Fats and oils Butter. Stick margarine. Lard. Shortening. Ghee. Bacon fat. Tropical oils, such as coconut, palm kernel, or palm oil. Seasonings and condiments Onion salt, garlic salt, seasoned salt, table salt, and sea salt. Worcestershire sauce. Tartar sauce. Barbecue sauce. Teriyaki sauce. Soy sauce, including reduced-sodium. Steak sauce. Canned and packaged gravies. Fish sauce. Oyster sauce. Cocktail sauce. Store-bought horseradish. Ketchup. Mustard. Meat flavorings and tenderizers. Bouillon cubes. Hot sauces. Pre-made or packaged marinades. Pre-made or packaged taco seasonings. Relishes. Regular salad dressings. Other foods Salted popcorn and pretzels. The items listed above may not be a complete list of foods and beverages you should avoid. Contact a dietitian for more information. Where to find more information National Heart, Lung, and Blood Institute: https://wilson-eaton.com/ American Heart Association: www.heart.org Academy of Nutrition and Dietetics: www.eatright.[place]: www.kidney.org Summary The DASH eating plan is a healthy eating plan that has been shown to reduce high blood pressure (hypertension). It may also reduce your risk for type 2 diabetes, heart disease, and stroke. When on the DASH eating plan, aim to eat more fresh fruits and  vegetables, whole grains, lean proteins, low-fat dairy, and heart-healthy fats. With the DASH eating plan, you should limit salt (sodium) intake to 2,300 mg a day. If you have hypertension, you may need to reduce your sodium intake to 1,500 mg a day. Work with your  health care provider or dietitian to adjust your eating plan to your individual calorie needs. This information is not intended to replace advice given to you by your health care provider. Make sure you discuss any questions you have with your health care provider. Document Revised: 09/11/2019 Document Reviewed: 09/11/2019 Elsevier Patient Education  2022 [employer].

## 2021-10-27 NOTE — Assessment & Plan Note (Signed)
Last A1c over 8% Did increase trulicity to 4.5mg  weekly Probably needs to restart glipizide 2.5mg  every morning

## 2021-10-27 NOTE — Progress Notes (Signed)
Hearing Screening - Comments:: Passed whisper test Vision Screening - Comments:: April 2022

## 2021-11-01 DIAGNOSIS — Z5181 Encounter for therapeutic drug level monitoring: Secondary | ICD-10-CM | POA: Diagnosis not present

## 2021-11-01 DIAGNOSIS — I10 Essential (primary) hypertension: Secondary | ICD-10-CM | POA: Diagnosis not present

## 2021-11-01 DIAGNOSIS — Z940 Kidney transplant status: Secondary | ICD-10-CM | POA: Diagnosis not present

## 2021-11-01 DIAGNOSIS — E1122 Type 2 diabetes mellitus with diabetic chronic kidney disease: Secondary | ICD-10-CM | POA: Diagnosis not present

## 2021-11-01 DIAGNOSIS — R0602 Shortness of breath: Secondary | ICD-10-CM | POA: Diagnosis not present

## 2021-11-01 DIAGNOSIS — R600 Localized edema: Secondary | ICD-10-CM | POA: Diagnosis not present

## 2021-11-01 DIAGNOSIS — E7800 Pure hypercholesterolemia, unspecified: Secondary | ICD-10-CM | POA: Diagnosis not present

## 2021-11-01 DIAGNOSIS — R001 Bradycardia, unspecified: Secondary | ICD-10-CM | POA: Diagnosis not present

## 2021-11-15 ENCOUNTER — Telehealth: Payer: Self-pay

## 2021-11-15 NOTE — Progress Notes (Addendum)
° ° ° ° ° °  Chronic Care Management Pharmacy Assistant   Name: Richard Chen  MRN: [id number] DOB: 02/10/1956  Reason for Encounter: CCM (Trulicity 0802 PAP Update)  Called patient in regards to his Trulicity patient assistance paperwork for 2023. Patient was to complete and mail forms to Dr. Ronny Flurry for signature. [employer] has not received any forms for the patient. Patient stated he mailed it back to Dr. Ronny Flurry 2-3 weeks ago. Please re-fax forms to [phone]. Please include full name, date of birth and ID #: [id number] on fax cover sheet.   Debbora Dus, CPP notified  Marijean Niemann, [place] Clinical Pharmacy Assistant [phone]  Time Spent: 10 Minutes

## 2021-11-15 NOTE — Telephone Encounter (Signed)
Amy, could you let patient know forms have not been turned in. Patient may need to contact Dr. Ronny Flurry to push forms forward.  Endocrinology Marvene Staff, NP [address] [place], [place] [address] [phone] (Work) [phone]

## 2021-11-16 NOTE — Progress Notes (Signed)
Called Marvene Staff, NP office to follow up on Trulicity patient assistance forms. I had to leave a message. This provider's phone number is [phone].   Debbora Dus, CPP notified  Marijean Niemann, [place] Clinical Pharmacy Assistant [phone]  Time Spent: 15 Minutes

## 2021-11-28 NOTE — Telephone Encounter (Addendum)
Contacted the patient to inform that endocrinology office has been notified [employer] still does not have re-enrollment forms for Trulicity 4.5mg  weekly injection, The patient is down to 2 pens remaining. [employer] fax # [phone]  Debbora Dus, CPP notified  Avel Sensor, [place] Assistant [phone]  Total time spent for month CPA: 10 min.

## 2021-11-28 NOTE — Telephone Encounter (Signed)
Left voicemail for Richard Flurry, NP regarding status of PAP forms for Trulicity 4.5 mg. Requested a call back.  Debbora Dus, PharmD Clinical Pharmacist Practitioner [facility] [phone]

## 2021-11-30 ENCOUNTER — Telehealth: Payer: Self-pay

## 2021-11-30 NOTE — Progress Notes (Signed)
I did not; Richard Chen's office was going to follow up.

## 2021-11-30 NOTE — Progress Notes (Addendum)
Please see follow up for Trulicity encounter dated 11/30/2021.

## 2021-11-30 NOTE — Progress Notes (Signed)
° ° °  Chronic Care Management Pharmacy Assistant   Name: Richard Chen  MRN: [id number] DOB: 22-Dec-1955  Reason for Encounter: CCM (Trulicity Patient Assistance Update)   Called Richard Staff, NP office for updated status on patient's Trulicity patient assistance forms for 2023. I spoke with Richard Chen who is Lexicographer. Richard Chen stated they have not received any paperwork for the patient. Richard Chen is going to have the providers patient assistance department work on completing the PAP forms. I gave Richard Chen my direct number in case they should need anything.  Richard Chen, CPP notified  Richard Chen, [place] Clinical Pharmacy Assistant [phone]  Time Spent: 33 Minutes

## 2021-12-01 ENCOUNTER — Telehealth: Payer: Self-pay

## 2021-12-01 NOTE — Chronic Care Management (AMB) (Addendum)
Chronic Care Management Pharmacy Assistant   Name: Richard Chen  MRN: [id number] DOB: [birth year]  Reason for Encounter:  Cholesterol Disease State   Recent office visits:  10/27/21 - Viviana Simpler, MD, PCP - Patient presented for AWV. Discussed screenings, vaccines, exercise and social engagement. Last A1c 8%. Endocrinology increase Trulicity to 4.5 mg weekly. Cardiology restarted carvedilol. Updated medication list. Patient needs to schedule colonoscopy.  Recent consult visits:  11/01/21 - Isaias Cowman, MD, [facility] - Pt presented for CHF, HTN. Uptitrate carvedilol to 6.25 mg p.o. twice daily. 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    Alcohol Swabs (ALCOHOL PREP) PADS 1 Units by Does not apply route daily.   allopurinol (ZYLOPRIM) 100 MG tablet Take 100 mg by mouth daily.   atorvastatin (LIPITOR) 20 MG tablet Take 1 tablet (20 mg total) by mouth daily.   Blood Glucose Monitoring Suppl (ACCU-CHEK GUIDE) w/Device KIT USE AS DIRECTED   carvedilol (COREG) 3.125 MG tablet Take 3.125 mg by mouth 2 (two) times daily.   Cholecalciferol (VITAMIN D3) 50 MCG (2000 UT) CAPS Take by mouth.   citalopram (CELEXA) 20 MG tablet TAKE 1 TABLET EVERY DAY   Dulaglutide 4.5 MG/0.5ML SOPN Inject into the skin. Inject into skin weekly   famotidine (PEPCID) 20 MG tablet Take 20 mg by mouth daily.   fexofenadine (ALLEGRA) 180 MG tablet Take 180 mg daily by mouth.   fluticasone (FLONASE) 50 MCG/ACT nasal spray USE 2 SPRAYS IN EACH  NOSTRIL EVERY DAY   glipiZIDE (GLUCOTROL XL) 2.5 MG 24 hr tablet Take 2.5 mg by mouth daily with breakfast.   glucose blood (ACCU-CHEK GUIDE) test strip Use to check blood sugar once a day Dx Code E11.29   mycophenolate (CELLCEPT) 250 MG capsule Take 250 mg by mouth. Take 1 in the morning and 1 at bedtime (08/18/21)   predniSONE (DELTASONE) 5 MG tablet 5 mg daily.   senna-docusate (SENOKOT-S) 8.6-50 MG tablet Take by mouth 2 (two) times daily as needed.   sildenafil (VIAGRA) 100 MG tablet Take 1 tablet (100 mg total) by mouth daily as needed for erectile dysfunction. Take two hours prior to intercourse on an empty stomach   tacrolimus (PROGRAF) 1 MG capsule 4 tablets qAM and 4 tablets qPM changed 07/06/21   tamsulosin (FLOMAX) 0.4 MG CAPS capsule TAKE 1 CAPSULE EVERY DAY   No facility-administered encounter medications on file as of 12/01/2021.   12/01/2021 Name: Richard Chen MRN: [id number] DOB: Dec 06, 1955 Richard Chen is a 66 y.o. year old male who is a primary care patient of Venia Carbon, MD.  Comprehensive medication review performed; Spoke to patient regarding cholesterol.   Contacted patient on 12/01/21 to discuss hyperlipidemia.  Lipid Panel    Component Value Date/Time   CHOL 133 10/13/2019 1000   TRIG 125.0 10/13/2019 1000   HDL 42.90 10/13/2019 1000   LDLCALC 65 10/13/2019 1000   LDLDIRECT 166.5 10/26/2013 1531    10-year ASCVD risk score: The 10-year ASCVD risk score (Arnett DK, et al., 2019)  is: 33.2%   Values used to calculate the score:     Age: 66 years     Sex: Male     Is Non-Hispanic African American: Yes     Diabetic: Yes     Tobacco smoker: No     Systolic Blood Pressure: 594 mmHg     Is BP treated: Yes     HDL Cholesterol: 46 mg/dL     Total Cholesterol: 181 mg/dL  Current antihyperlipidemic regimen:  Atorvastatin 20 mg - 1 tablet daily (9 PM)- confirmed with the patient   Previous antihyperlipidemic medications tried: none  ASCVD risk  enhancing conditions: age >8, DM, and HTN  What recent interventions/DTPs have been made by any provider to improve Cholesterol control since last CPP Visit:  none identified, last lipid profile 09/08/2020  Any recent hospitalizations or ED visits since last visit with CPP? No  What diet changes have been made to improve Cholesterol?   Patient doing DASH diet  What exercise is being done to improve Cholesterol?    None identified, however, the patient is not sedentary with lifestyle  Adherence Review: Does the patient have >5 day gap between last estimated fill dates? CPP to review  Annual wellness visit in last year? Yes Most Recent BP reading:124/74  72-P 10/27/21  If Diabetic: Most recent A1C reading:  8.5%  09/20/21 Last eye exam / retinopathy screening:2021 Last diabetic foot exam:  2021  Upcoming appointments: CCM appointment on 03/13/22  Star Rating Drugs Medication Name/Dose: Last fill date Days supply Atorvastatin 64m  11/24/21  90 Glipizide 131m Ordered  1/5/8/59rulicity 4.2.9WK                Patient Assistance  MiDebbora DusCPP notified  VeAvel SensorCCHahirassistant [phone] have reviewed the care management and care coordination activities outlined in this encounter and I am certifying that I agree with the content of this note. No further action required.  MiDebbora DusPharmD Clinical Pharmacist [phone]

## 2021-12-14 DIAGNOSIS — Z940 Kidney transplant status: Secondary | ICD-10-CM | POA: Diagnosis not present

## 2021-12-14 DIAGNOSIS — E669 Obesity, unspecified: Secondary | ICD-10-CM | POA: Diagnosis not present

## 2021-12-14 DIAGNOSIS — Z794 Long term (current) use of insulin: Secondary | ICD-10-CM | POA: Diagnosis not present

## 2021-12-14 DIAGNOSIS — Z949 Transplanted organ and tissue status, unspecified: Secondary | ICD-10-CM | POA: Diagnosis not present

## 2021-12-14 DIAGNOSIS — Z5181 Encounter for therapeutic drug level monitoring: Secondary | ICD-10-CM | POA: Diagnosis not present

## 2021-12-14 DIAGNOSIS — E1122 Type 2 diabetes mellitus with diabetic chronic kidney disease: Secondary | ICD-10-CM | POA: Diagnosis not present

## 2021-12-14 DIAGNOSIS — N185 Chronic kidney disease, stage 5: Secondary | ICD-10-CM | POA: Diagnosis not present

## 2021-12-15 ENCOUNTER — Encounter: Payer: Self-pay | Admitting: Internal Medicine

## 2021-12-15 DIAGNOSIS — E1121 Type 2 diabetes mellitus with diabetic nephropathy: Secondary | ICD-10-CM

## 2021-12-22 ENCOUNTER — Other Ambulatory Visit: Payer: Self-pay

## 2021-12-22 ENCOUNTER — Encounter: Payer: Self-pay | Admitting: Internal Medicine

## 2021-12-22 ENCOUNTER — Ambulatory Visit (INDEPENDENT_AMBULATORY_CARE_PROVIDER_SITE_OTHER): Payer: Medicare HMO | Admitting: Internal Medicine

## 2021-12-22 VITALS — BP 130/82 | HR 67 | Temp 97.7°F | Ht 72.5 in | Wt 240.0 lb

## 2021-12-22 DIAGNOSIS — E1121 Type 2 diabetes mellitus with diabetic nephropathy: Secondary | ICD-10-CM

## 2021-12-22 MED ORDER — GLIPIZIDE ER 5 MG PO TB24: 5.0000 mg | ORAL_TABLET | Freq: Every day | ORAL | 3 refills | Status: DC

## 2021-12-22 NOTE — Patient Instructions (Signed)
Please increase the glipizde to 5mg  with breakfast. If your afternoon sugars aren't under 200 in 2-3 weeks, we can increase it to 10mg  with breakfast. ?

## 2021-12-22 NOTE — Assessment & Plan Note (Addendum)
Now with poor control---last A1c 9.6% ?On dulaglutide 4.5mg  weekly ?On glipizide XL 2.5mg  --1/2 daily and then increased to bid. Notes his sugar really goes down with this ?Would hold off on SGLT-2 inhibitor with his renal function ? ?Will increase glipizide to 5mg  with breakfast. Consider increase to 10mg  if 5 isn't enough ?
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
## 2021-12-27 NOTE — Telephone Encounter (Signed)
Several unsuccessful attempts to reach the patient.  ? ?Called patient again today. Pt does not want to go to [facility] at this - Requests to hold off on TOC.  ?Referral has been cancelled. ? ?FYI ?

## 2022-01-08 ENCOUNTER — Telehealth: Payer: Self-pay

## 2022-01-08 NOTE — Chronic Care Management (AMB) (Signed)
? ? ?  Chronic Care Management ?Pharmacy Assistant  ? ?Name: Richard Chen  MRN: [id number] DOB: Jun 25, 1956 ? ? ?Reason for Encounter: Reminder Call ?  ?Conditions to be addressed/monitored: ?HTN, HLD, and DMII ? ? ? ?Medications: ?Outpatient Encounter Medications as of 01/08/2022  ?Medication Sig  ? Accu-Chek Softclix Lancets lancets Use to obtain blood sugar dx code E11.29  ? acetaminophen (TYLENOL) 500 MG tablet Take 500 mg by mouth every 6 (six) hours as needed (pain).   ? Alcohol Swabs (ALCOHOL PREP) PADS 1 Units by Does not apply route daily.  ? allopurinol (ZYLOPRIM) 100 MG tablet Take 100 mg by mouth daily.  ? atorvastatin (LIPITOR) 20 MG tablet Take 1 tablet (20 mg total) by mouth daily.  ? Blood Glucose Monitoring Suppl (ACCU-CHEK GUIDE) w/Device KIT USE AS DIRECTED  ? carvedilol (COREG) 3.125 MG tablet Take 3.125 mg by mouth 2 (two) times daily.  ? Cholecalciferol (VITAMIN D3) 50 MCG (2000 UT) CAPS Take by mouth.  ? citalopram (CELEXA) 20 MG tablet TAKE 1 TABLET EVERY DAY  ? Dulaglutide 4.5 MG/0.5ML SOPN Inject into the skin. Inject into skin weekly  ? famotidine (PEPCID) 20 MG tablet Take 20 mg by mouth daily.  ? fexofenadine (ALLEGRA) 180 MG tablet Take 180 mg daily by mouth.  ? fluticasone (FLONASE) 50 MCG/ACT nasal spray USE 2 SPRAYS IN EACH NOSTRIL EVERY DAY  ? glipiZIDE (GLUCOTROL XL) 5 MG 24 hr tablet Take 1 tablet (5 mg total) by mouth daily with breakfast.  ? glucose blood (ACCU-CHEK GUIDE) test strip Use to check blood sugar once a day Dx Code E11.29  ? mycophenolate (CELLCEPT) 250 MG capsule Take 250 mg by mouth. Take 1 in the morning and 1 at bedtime (08/18/21)  ? predniSONE (DELTASONE) 5 MG tablet 5 mg daily.  ? senna-docusate (SENOKOT-S) 8.6-50 MG tablet Take by mouth 2 (two) times daily as needed.  ? sildenafil (VIAGRA) 100 MG tablet Take 1 tablet (100 mg total) by mouth daily as needed for erectile dysfunction. Take two hours prior to intercourse on an empty stomach  ? tacrolimus (PROGRAF) 1  MG capsule 4 tablets qAM and 4 tablets qPM changed 07/06/21  ? tamsulosin (FLOMAX) 0.4 MG CAPS capsule TAKE 1 CAPSULE EVERY DAY  ? ?No facility-administered encounter medications on file as of 01/08/2022.  ? ? ?Ow[place] Hara Portnoy was contacted to remind of upcoming telephone visit with Charlene Brooke on 01/09/22 at 11:45 am. Patient was reminded to have any blood glucose and blood pressure readings available for review at appointment.  ? ?Patient confirmed appointment. ? ? ?Are you having any problems with your medications? Yes Patient used last trulicity pen 5/68/12- specialist office took over [place] told patient no records received, I spoke with specialist office, confirmed delivery of forms and stated manufacturer behind . Patient reports he cannot afford a local prescription to [facility]. Multiple contacts with manufacturer and specialist on our behalf. ? ?Do you have any concerns you like to discuss with the pharmacist? Yes  He just is unable to afford medication ? ? ? ?Star Rating Drugs: ?Medication:  Last Fill: Day Supply ?Atorvastatin 20mg  11/24/21  90 ?Glipizide 5mg   02/07/21 90 ?Trulicity  Manufacturer ? ?Charlene Brooke, CPP notified ? ?Elya Tarquinio, CCMA ?Health concierge  ?[phone]  ?

## 2022-01-09 ENCOUNTER — Other Ambulatory Visit: Payer: Self-pay

## 2022-01-09 ENCOUNTER — Ambulatory Visit (INDEPENDENT_AMBULATORY_CARE_PROVIDER_SITE_OTHER): Payer: Medicare HMO | Admitting: Pharmacist

## 2022-01-09 DIAGNOSIS — E785 Hyperlipidemia, unspecified: Secondary | ICD-10-CM

## 2022-01-09 DIAGNOSIS — Z940 Kidney transplant status: Secondary | ICD-10-CM

## 2022-01-09 DIAGNOSIS — E1121 Type 2 diabetes mellitus with diabetic nephropathy: Secondary | ICD-10-CM

## 2022-01-09 DIAGNOSIS — I700 Atherosclerosis of aorta: Secondary | ICD-10-CM

## 2022-01-09 DIAGNOSIS — I10 Essential (primary) hypertension: Secondary | ICD-10-CM

## 2022-01-09 NOTE — Progress Notes (Signed)
? ?Chronic Care Management ?Pharmacy Note ? ?01/10/2022 ?Name:  Richard Chen MRN:  [id number] DOB:  1955/12/02 ? ?Summary:  CCM F/U visit ?-Trulicity PAP through Endocrine office is delayed - explained [employer] processing delay, they are ~1 month behind on processing applications ?-Last Trulicity dose 3/81. Next dose due 3/25. [facility] does have Rx ready for $45/month. ?-Pt is up to glipizide 10 mg (2 x 5 mg) in AM. Fasting BG 135-160, pre-dinner BG 178-194 ?-Last LDL 65 from 09/2019, pt is due for repeat lipid panel. ? ?Recommendations/Changes made from today's visit: ?-Advised to stay in touch with endocrine office for Trulicity PAP approval; may have to pick up 1 month supply at [facility] as bridge ?-Recommend repeat lipid panel at next PCP visit ? ?Plan: ?-[facility] will call patient 1 month for DM update ?-Pharmacist follow up televisit scheduled for 3 months ?-PCP F/U 04/27/22 ? ? ? ?Subjective: ?Richard Chen is an 66 y.o. year old male who is a primary patient of Letvak, Theophilus Kinds, MD.  The CCM team was consulted for assistance with disease management and care coordination needs.   ? ?Engaged with patient by telephone for follow up visit in response to provider referral for pharmacy case management and/or care coordination services.  ? ?Consent to Services:  ?The patient was given information about Chronic Care Management services, agreed to services, and gave verbal consent prior to initiation of services.  Please see initial visit note for detailed documentation.  ? ?Patient Care Team: ?Venia Carbon, MD as PCP - General ?Charlton Haws, [facility] as Pharmacist (Pharmacist) ? ?Recent office visits: ?12/22/21 Dr Silvio Pate OV: elevated BG PM; A1c 9.6%; increase glipizide to 5 mg AM. Recent referral to [facility] endocrine (closer than [facility]), however [facility] not accepting pts. Referral cancelled. ? ?Recent consult visits: ?12/14/21 Dr Tally Due ([facility]): Kidney Tx 04/2020. Refer to nutrition  for wt loss. Sch f/u with endo ASAP.[name] ? ?Hospital visits: ?None in previous 6 months ? ? ?Objective: ? ?Lab Results  ?Component Value Date  ? CREATININE 6.66 (H) 05/09/2020  ? BUN 37 (H) 05/09/2020  ? GFR 17.94 (L) 03/05/2016  ? GFRNONAA 8 (L) 05/09/2020  ? GFRAA 9 (L) 05/09/2020  ? NA 136 05/09/2020  ? K 3.7 05/09/2020  ? CALCIUM 8.1 (L) 05/09/2020  ? CO2 30 05/09/2020  ? GLUCOSE 151 (H) 05/09/2020  ? ? ?Lab Results  ?Component Value Date/Time  ? HGBA1C 7.0 (A) 04/20/2021 02:47 PM  ? HGBA1C 7.6 (A) 10/18/2020 10:07 AM  ? HGBA1C 6.2 (H) 02/07/2020 07:23 AM  ? HGBA1C 6.6 (H) 10/13/2019 10:00 AM  ? GFR 17.94 (L) 03/05/2016 12:39 PM  ? GFR 53.30 (L) 02/18/2015 12:25 PM  ? MICROALBUR 37.6 (H) 10/09/2011 10:18 AM  ? MICROALBUR 8.1 (H) 06/28/2009 09:24 AM  ?  ?Last diabetic Eye exam:  ?Lab Results  ?Component Value Date/Time  ? HMDIABEYEEXA No Retinopathy 05/10/2020 12:00 AM  ?  ?Last diabetic Foot exam:  ?Lab Results  ?Component Value Date/Time  ? HMDIABFOOTEX done 10/18/2020 12:00 AM  ?  ? ?Lab Results  ?Component Value Date  ? CHOL 133 10/13/2019  ? HDL 42.90 10/13/2019  ? [place] 65 10/13/2019  ? LDLDIRECT 166.5 10/26/2013  ? TRIG 125.0 10/13/2019  ? CHOLHDL 3 10/13/2019  ? ? ? ?  Latest Ref Rng & Units 05/09/2020  ? 10:16 AM 02/07/2020  ?  7:23 AM 10/13/2019  ? 10:00 AM  ?Hepatic Function  ?Total Protein 6.5 - 8.1  g/dL 7.9   7.6   6.8    ?Albumin 3.5 - 5.0 g/dL 4.1   3.7   3.7    ?AST 15 - 41 U/L _0 ?ALT 0 - 44 U/L _1 ?Alk Phosphatase 38 - 126 U/L 74   76   72    ?Total Bilirubin 0.3 - 1.2 mg/dL 0.7   0.8   0.3    ?Bilirubin, Direct 0.0 - 0.3 mg/dL   0.1    ? ? ?Lab Results  ?Component Value Date/Time  ? TSH 1.79 03/26/2018 09:37 AM  ? TSH 1.10 07/14/2014 11:43 AM  ? FREET4 1.02 10/13/2019 10:00 AM  ? FREET4 1.04 03/26/2018 09:37 AM  ? ? ? ?  Latest Ref Rng & Units 05/09/2020  ? 10:16 AM 02/07/2020  ?  7:23 AM 10/03/2019  ? 10:55 AM  ?CBC  ?WBC 4.0 - 10.5 K/uL 7.7   6.6   6.8    ?Hemoglobin 13.0  - 17.0 g/dL 11.1   11.4   10.1    ?Hematocrit 39.0 - 52.0 % 33.7   37.0   32.2    ?Platelets 150 - 400 K/uL 146   161   152    ? ? ?No results found for: VD25OH ? ?Clinical ASCVD: Yes  - aortic atherosclerosis ?The 10-year ASCVD risk score (Arnett DK, et al., 2019) is: 31.8% ?  Values used to calculate the score: ?    Age: 28 years ?    Sex: Male ?    Is Non-Hispanic African American: Yes ?    Diabetic: Yes ?    Tobacco smoker: No ?    Systolic Blood Pressure: 158 mmHg ?    Is BP treated: Yes ?    HDL Cholesterol: 46 mg/dL ?    Total Cholesterol: 181 mg/dL   ? ?   ? View : No data to display.  ?  ?  ?  ?  ? ? ?Social History  ? ?Tobacco Use  ?Smoking Status Never  ?Smokeless Tobacco Never  ? ?BP Readings from Last 3 Encounters:  ?12/22/21 130/82  ?10/27/21 124/74  ?08/31/21 (!) 149/73  ? ?Pulse Readings from Last 3 Encounters:  ?12/22/21 67  ?10/27/21 72  ?08/31/21 67  ? ?Wt Readings from Last 3 Encounters:  ?12/22/21 240 lb (108.9 kg)  ?10/27/21 239 lb (108.4 kg)  ?08/31/21 240 lb (108.9 kg)  ? ?BMI Readings from Last 3 Encounters:  ?12/22/21 32.10 kg/m?  ?10/27/21 31.97 kg/m?  ?08/31/21 31.66 kg/m?  ? ? ?Assessment/Interventions: Review of patient past medical history, allergies, medications, health status, including review of consultants reports, laboratory and other test data, was performed as part of comprehensive evaluation and provision of chronic care management services.  ? ?SDOH:  (Social Determinants of Health) assessments and interventions performed: No ? ?SDOH Screenings  ? ?Alcohol Screen: Not on file  ?Depression (PHQ2-9): Not on file  ?Financial Resource Strain: High Risk  ? Difficulty of Paying Living Expenses: Hard  ?Food Insecurity: Not on file  ?Housing: Not on file  ?Physical Activity: Not on file  ?Social Connections: Not on file  ?Stress: Not on file  ?Tobacco Use: Low Risk   ? Smoking Tobacco Use: Never  ? Smokeless Tobacco Use: Never  ? Passive Exposure: Not on file  ?Transportation Needs:  Not on file  ? ? ?[place] ? ?Allergies  ?Allergen  Reactions  ? Aspirin Anaphylaxis  ? Shellfish Allergy Anaphylaxis  ? Clonidine Other (See Comments)  ?  Gait imbalance/ dysfunction requiring wheelchair.  ?Increased lethargy.   ? Other Hives  ?  Dye when viewed kidney (break out in knots)  ? Contrast Media [Iodinated Contrast Media] Rash and Hives  ? ? ?Medications Reviewed Today   ? ? Reviewed by Charlton Haws, [facility] (Pharmacist) on 01/09/22 at 1208  Med List Status: <None>  ? ?Medication Order Taking? Sig Documenting Provider Last Dose Status Informant  ?Accu-Chek Softclix Lancets lancets [id number] Yes Use to obtain blood sugar dx code E11.29 Venia Carbon, MD Taking Active   ?acetaminophen (TYLENOL) 500 MG tablet [id number] Yes Take 500 mg by mouth every 6 (six) hours as needed (pain).  [provider] Taking Active Spouse/Significant Other  ?Alcohol Swabs (ALCOHOL PREP) PADS [id number] Yes 1 Units by Does not apply route daily. Venia Carbon, MD Taking Active Spouse/Significant Other  ?allopurinol (ZYLOPRIM) 100 MG tablet [id number] Yes Take 100 mg by mouth daily. [provider] Taking Active   ?atorvastatin (LIPITOR) 20 MG tablet [id number] Yes Take 1 tablet (20 mg total) by mouth daily. Venia Carbon, MD Taking Active   ?Blood Glucose Monitoring Suppl (ACCU-CHEK GUIDE) w/Device KIT [id number] Yes USE AS DIRECTED Venia Carbon, MD Taking Active   ?carvedilol (COREG) 3.125 MG tablet [id number] Yes Take 3.125 mg by mouth 2 (two) times daily. [provider] Taking Active   ?Cholecalciferol (VITAMIN D3) 50 MCG (2000 UT) CAPS [id number] Yes Take by mouth. [provider] Taking Active   ?citalopram (CELEXA) 20 MG tablet [id number] Yes TAKE 1 TABLET EVERY DAY Venia Carbon, MD Taking Active   ?Dulaglutide 4.5 MG/0.5ML SOPN [id number] Yes Inject into the skin. Inject into skin weekly [provider] Taking Active   ?famotidine (PEPCID) 20 MG tablet  [id number] Yes Take 20 mg by mouth daily. [provider] Taking Active   ?fexofenadine (ALLEGRA) 180 MG tablet [id number] Yes Take 180 mg daily by mouth. [provider] Taking Active S

## 2022-01-10 NOTE — Patient Instructions (Addendum)
Visit Information ? ?Phone number for Pharmacist: [phone] ? ? Goals Addressed   ? ?  ?  ?  ?  ? This Visit's Progress  ?  Monitor and Manage My Blood Sugar-Diabetes Type 2     ?  Timeframe:  Long-Range Goal ?Priority:  Medium ?Start Date:    01/10/22                         ?Expected End Date: 01/11/23                    ? ?Follow Up Date June 2023 ?  ?- check blood sugar at prescribed times ?- check blood sugar if I feel it is too high or too low ?- enter blood sugar readings and medication or insulin into daily log ?- take the blood sugar log to all doctor visits  ?  ?Why is this important?   ?Checking your blood sugar at home helps to keep it from getting very high or very low.  ?Writing the results in a diary or log helps the doctor know how to care for you.  ?Your blood sugar log should have the time, date and the results.  ?Also, write down the amount of insulin or other medicine that you take.  ?Other information, like what you ate, exercise done and how you were feeling, will also be helpful.   ?  ?Notes:  ?  ? ?  ? ? ?Care Plan : [place]  ?Updates made by Charlton Haws, RPH since 01/10/2022 12:00 AM  ?  ? ?Problem: Hypertension, Hyperlipidemia, Diabetes, and Chronic Kidney Disease   ?Priority: High  ?  ? ?Long-Range Goal: Disease mgmt   ?Start Date: 02/23/2021  ?Expected End Date: 01/11/2023  ?This Visit's Progress: On track  ?Recent Progress: On track  ?Priority: High  ?Note:   ?Current Barriers:  ?Unable to independently afford treatment regimen ? ?Pharmacist Clinical Goal(s):  ?Patient will verbalize ability to afford treatment regimen through collaboration with PharmD and provider.  ? ?Interventions: ?1:1 collaboration with Venia Carbon, MD regarding development and update of comprehensive plan of care as evidenced by provider attestation and co-signature ?Inter-disciplinary care team collaboration (see longitudinal plan of care) ?Comprehensive medication review performed;  medication list updated in electronic medical record ? ?Hypertension (BP goal <140/90) ?-Controlled - patient has been taken off all BP medication per transplant team due to hypotension/dizziness ?-Pt's BP is closely following by transplant team and cardiology. ?-Pt has a home BP cuff, verified accurate with [facility]  ?-Checks home BP in left arm, seated, with feet flat on the floor, after resting 5 minutes ?-Current home readings: 133/75, 68(11/22 morning); 168/81, 63 (last night); 127/77, 74 (11/22 morning) ?-Current treatment: ?Carvedilol 6.25 mg BID - Appropriate, Effective, Safe, Accessible ?-Medications previously tried: hydralazine, losartan, amlodipine ?-Educated on BP goals and benefits of medications for prevention of heart attack, stroke and kidney damage; Proper BP monitoring technique; ?-Counseled to monitor BP at home daily ?-Recommend to continue current medication ? ?Hyperlipidemia: (LDL goal < 70) ?-Controlled - LDL 65 (09/2019) at goal; pt endorses compliance with statin ?-Hx aortic atherosclerosis ?-Current treatment: ?Atorvastatin 20 mg daily HS - Appropriate, Query Effective ?-Medications previously tried: none ?-Educated on Cholesterol goals;  ?-Recommended to continue current medication; Recommend updating lipid panel at next PCP visit (04/2022) ? ?Diabetes (A1c goal <7%) ?-Uncontrolled - A1c 9.6% (11/2021), worsening. Pt recently added glipizide back on, 10 mg  in the mornings. Trulicity PAP is still in processing per endocrine office, [facility]  ?-Followed by Dr. YRonny Flurry?-Current home glucose readings - checks twice daily 9 AM and 6 PM ?Fasting glucose: 171, 160, 145, 135 ?Before supper: 178, 194, 182 (lunch around 1:30) ?-Current medications: ?Trulicity 4.5 mg weekly - Appropriate, Effective, Safe, Query Accessible ?Glipizide 5 mg - 2 tab AM - Appropriate, Effective, Safe, Accessible ?-Medications previously tried:  insulin   ?-Denies hypoglycemic/hyperglycemic symptoms ?-Diet - reports snacks on little debbie, nekot crackers have resulted in higher readings before supper ?-Educated on A1c and blood sugar goals; ?-Discussed processing delays with [employer] - they are about a month behind, they do have his application but are still processing, no timeline available for approval; advised pt to stay in contact with endocrine office about approval, but he may have to buy 177-monthupply at [facility] ($45) as bridge ? ?CKD Stage 3b (Goal: maintain kidney function) ?-Follows with [facility] (s/p transplant 04/2020) ? ? ?Patient Goals/Self-Care Activities ?Patient will:  ?- take medications as prescribed as evidenced by patient report and record review ?focus on medication adherence by routine ?check glucose twice daily, document, and provide at future appointments ?collaborate with provider on medication access solutions ? ?  ?  ? ?Patient verbalizes understanding of instructions and care plan provided today and agrees to view in [facility] MyChart status confirmed with patient.   ?Telephone follow up appointment with pharmacy team member scheduled for: 3 months ? ?LiCharlene BrookePharmD, BCACP ?Clinical Pharmacist ?[phone]  ?

## 2022-01-17 DIAGNOSIS — D225 Melanocytic nevi of trunk: Secondary | ICD-10-CM | POA: Diagnosis not present

## 2022-01-17 DIAGNOSIS — Z1283 Encounter for screening for malignant neoplasm of skin: Secondary | ICD-10-CM | POA: Diagnosis not present

## 2022-01-17 DIAGNOSIS — D1801 Hemangioma of skin and subcutaneous tissue: Secondary | ICD-10-CM | POA: Diagnosis not present

## 2022-01-17 DIAGNOSIS — L814 Other melanin hyperpigmentation: Secondary | ICD-10-CM | POA: Diagnosis not present

## 2022-01-17 DIAGNOSIS — L578 Other skin changes due to chronic exposure to nonionizing radiation: Secondary | ICD-10-CM | POA: Diagnosis not present

## 2022-01-17 DIAGNOSIS — L821 Other seborrheic keratosis: Secondary | ICD-10-CM | POA: Diagnosis not present

## 2022-01-17 DIAGNOSIS — D2270 Melanocytic nevi of unspecified lower limb, including hip: Secondary | ICD-10-CM | POA: Diagnosis not present

## 2022-01-17 DIAGNOSIS — D2260 Melanocytic nevi of unspecified upper limb, including shoulder: Secondary | ICD-10-CM | POA: Diagnosis not present

## 2022-01-19 DIAGNOSIS — N1832 Chronic kidney disease, stage 3b: Secondary | ICD-10-CM | POA: Diagnosis not present

## 2022-01-19 DIAGNOSIS — I129 Hypertensive chronic kidney disease with stage 1 through stage 4 chronic kidney disease, or unspecified chronic kidney disease: Secondary | ICD-10-CM

## 2022-01-19 DIAGNOSIS — E785 Hyperlipidemia, unspecified: Secondary | ICD-10-CM | POA: Diagnosis not present

## 2022-01-19 DIAGNOSIS — Z7984 Long term (current) use of oral hypoglycemic drugs: Secondary | ICD-10-CM | POA: Diagnosis not present

## 2022-01-19 DIAGNOSIS — E1122 Type 2 diabetes mellitus with diabetic chronic kidney disease: Secondary | ICD-10-CM | POA: Diagnosis not present

## 2022-01-23 DIAGNOSIS — E1122 Type 2 diabetes mellitus with diabetic chronic kidney disease: Secondary | ICD-10-CM | POA: Diagnosis not present

## 2022-01-23 DIAGNOSIS — N185 Chronic kidney disease, stage 5: Secondary | ICD-10-CM | POA: Diagnosis not present

## 2022-01-23 DIAGNOSIS — Z794 Long term (current) use of insulin: Secondary | ICD-10-CM | POA: Diagnosis not present

## 2022-01-31 DIAGNOSIS — Z940 Kidney transplant status: Secondary | ICD-10-CM | POA: Diagnosis not present

## 2022-01-31 DIAGNOSIS — I10 Essential (primary) hypertension: Secondary | ICD-10-CM | POA: Diagnosis not present

## 2022-01-31 DIAGNOSIS — R001 Bradycardia, unspecified: Secondary | ICD-10-CM | POA: Diagnosis not present

## 2022-01-31 DIAGNOSIS — I5032 Chronic diastolic (congestive) heart failure: Secondary | ICD-10-CM | POA: Diagnosis not present

## 2022-01-31 DIAGNOSIS — E7800 Pure hypercholesterolemia, unspecified: Secondary | ICD-10-CM | POA: Diagnosis not present

## 2022-01-31 DIAGNOSIS — R0602 Shortness of breath: Secondary | ICD-10-CM | POA: Diagnosis not present

## 2022-01-31 DIAGNOSIS — R600 Localized edema: Secondary | ICD-10-CM | POA: Diagnosis not present

## 2022-01-31 DIAGNOSIS — E1122 Type 2 diabetes mellitus with diabetic chronic kidney disease: Secondary | ICD-10-CM | POA: Diagnosis not present

## 2022-01-31 DIAGNOSIS — I161 Hypertensive emergency: Secondary | ICD-10-CM | POA: Diagnosis not present

## 2022-02-02 DIAGNOSIS — Z86010 Personal history of colonic polyps: Secondary | ICD-10-CM | POA: Diagnosis not present

## 2022-02-07 ENCOUNTER — Other Ambulatory Visit: Payer: Self-pay | Admitting: Internal Medicine

## 2022-02-07 ENCOUNTER — Other Ambulatory Visit: Payer: Self-pay

## 2022-02-07 NOTE — Telephone Encounter (Signed)
Pt's wife called in and states that pt is now taking 2 Glipizide a day due to his blood sugar staying at or above 200. Pt tried to refill rx at [place] but they said it was too soon and he will need a new script written for them to refill. Please advise.  ?

## 2022-02-08 MED ORDER — GLIPIZIDE ER 10 MG PO TB24: 10.0000 mg | ORAL_TABLET | Freq: Every day | ORAL | 3 refills | Status: DC

## 2022-02-14 ENCOUNTER — Other Ambulatory Visit: Payer: Self-pay | Admitting: Internal Medicine

## 2022-03-05 DIAGNOSIS — R0602 Shortness of breath: Secondary | ICD-10-CM | POA: Diagnosis not present

## 2022-03-05 DIAGNOSIS — I5032 Chronic diastolic (congestive) heart failure: Secondary | ICD-10-CM | POA: Diagnosis not present

## 2022-03-05 DIAGNOSIS — Z940 Kidney transplant status: Secondary | ICD-10-CM | POA: Diagnosis not present

## 2022-03-05 DIAGNOSIS — I10 Essential (primary) hypertension: Secondary | ICD-10-CM | POA: Diagnosis not present

## 2022-03-05 DIAGNOSIS — E7800 Pure hypercholesterolemia, unspecified: Secondary | ICD-10-CM | POA: Diagnosis not present

## 2022-03-12 ENCOUNTER — Telehealth: Payer: Medicare HMO

## 2022-03-14 DIAGNOSIS — Z79899 Other long term (current) drug therapy: Secondary | ICD-10-CM | POA: Diagnosis not present

## 2022-03-14 DIAGNOSIS — Z4822 Encounter for aftercare following kidney transplant: Secondary | ICD-10-CM | POA: Diagnosis not present

## 2022-03-14 DIAGNOSIS — D849 Immunodeficiency, unspecified: Secondary | ICD-10-CM | POA: Diagnosis not present

## 2022-03-14 DIAGNOSIS — I10 Essential (primary) hypertension: Secondary | ICD-10-CM | POA: Diagnosis not present

## 2022-03-14 DIAGNOSIS — R059 Cough, unspecified: Secondary | ICD-10-CM | POA: Diagnosis not present

## 2022-03-14 DIAGNOSIS — N186 End stage renal disease: Secondary | ICD-10-CM | POA: Diagnosis not present

## 2022-03-14 DIAGNOSIS — I120 Hypertensive chronic kidney disease with stage 5 chronic kidney disease or end stage renal disease: Secondary | ICD-10-CM | POA: Diagnosis not present

## 2022-03-14 DIAGNOSIS — Z940 Kidney transplant status: Secondary | ICD-10-CM | POA: Diagnosis not present

## 2022-03-14 DIAGNOSIS — D84821 Immunodeficiency due to drugs: Secondary | ICD-10-CM | POA: Diagnosis not present

## 2022-03-14 DIAGNOSIS — E1122 Type 2 diabetes mellitus with diabetic chronic kidney disease: Secondary | ICD-10-CM | POA: Diagnosis not present

## 2022-03-14 DIAGNOSIS — Z6831 Body mass index (BMI) 31.0-31.9, adult: Secondary | ICD-10-CM | POA: Diagnosis not present

## 2022-03-14 DIAGNOSIS — Z23 Encounter for immunization: Secondary | ICD-10-CM | POA: Diagnosis not present

## 2022-03-14 DIAGNOSIS — R42 Dizziness and giddiness: Secondary | ICD-10-CM | POA: Diagnosis not present

## 2022-03-27 DIAGNOSIS — Z940 Kidney transplant status: Secondary | ICD-10-CM | POA: Diagnosis not present

## 2022-03-27 DIAGNOSIS — Z0189 Encounter for other specified special examinations: Secondary | ICD-10-CM | POA: Diagnosis not present

## 2022-03-28 DIAGNOSIS — B259 Cytomegaloviral disease, unspecified: Secondary | ICD-10-CM | POA: Diagnosis not present

## 2022-03-28 DIAGNOSIS — Z5181 Encounter for therapeutic drug level monitoring: Secondary | ICD-10-CM | POA: Diagnosis not present

## 2022-03-28 DIAGNOSIS — Z940 Kidney transplant status: Secondary | ICD-10-CM | POA: Diagnosis not present

## 2022-03-28 DIAGNOSIS — B349 Viral infection, unspecified: Secondary | ICD-10-CM | POA: Diagnosis not present

## 2022-04-06 ENCOUNTER — Telehealth: Payer: Self-pay

## 2022-04-06 NOTE — Chronic Care Management (AMB) (Signed)
    Chronic Care Management Pharmacy Assistant   Name: Richard Chen  MRN: [id number] DOB: [birth year]   Reason for Encounter: Reminder Call   Conditions to be addressed/monitored: HTN, HLD, and DMII  Medications: Outpatient Encounter Medications as of 04/06/2022  Medication Sig   Accu-Chek Softclix Lancets lancets Use to obtain blood sugar dx code E11.29   acetaminophen (TYLENOL) 500 MG tablet Take 500 mg by mouth every 6 (six) hours as needed (pain).    Alcohol Swabs (ALCOHOL PREP) PADS 1 Units by Does not apply route daily.   allopurinol (ZYLOPRIM) 100 MG tablet Take 100 mg by mouth daily.   atorvastatin (LIPITOR) 20 MG tablet TAKE 1 TABLET EVERY DAY   Blood Glucose Monitoring Suppl (ACCU-CHEK GUIDE) w/Device KIT USE AS DIRECTED   carvedilol (COREG) 3.125 MG tablet Take 3.125 mg by mouth 2 (two) times daily.   Cholecalciferol (VITAMIN D3) 50 MCG (2000 UT) CAPS Take by mouth.   citalopram (CELEXA) 20 MG tablet TAKE 1 TABLET EVERY DAY   Dulaglutide 4.5 MG/0.5ML SOPN Inject into the skin. Inject into skin weekly   famotidine (PEPCID) 20 MG tablet Take 20 mg by mouth daily.   fexofenadine (ALLEGRA) 180 MG tablet Take 180 mg daily by mouth.   fluticasone (FLONASE) 50 MCG/ACT nasal spray USE 2 SPRAYS IN EACH NOSTRIL EVERY DAY   glipiZIDE (GLUCOTROL XL) 10 MG 24 hr tablet Take 1 tablet (10 mg total) by mouth daily with breakfast.   glucose blood (ACCU-CHEK GUIDE) test strip Use to check blood sugar once a day Dx Code E11.29   mycophenolate (CELLCEPT) 250 MG capsule Take 250 mg by mouth. Take 1 in the morning and 1 at bedtime (08/18/21)   predniSONE (DELTASONE) 5 MG tablet 5 mg daily.   senna-docusate (SENOKOT-S) 8.6-50 MG tablet Take by mouth 2 (two) times daily as needed.   sildenafil (VIAGRA) 100 MG tablet Take 1 tablet (100 mg total) by mouth daily as needed for erectile dysfunction. Take two hours prior to intercourse on an empty stomach   tacrolimus (PROGRAF) 1 MG capsule 4 tablets  qAM and 4 tablets qPM changed 07/06/21   tamsulosin (FLOMAX) 0.4 MG CAPS capsule TAKE 1 CAPSULE EVERY DAY   No facility-administered encounter medications on file as of 04/06/2022.    Drew Lips Diedrich was contacted to remind of upcoming telephone visit with Charlene Brooke on 04/11/22 at 8:45am. Patient was reminded to have any blood glucose and blood pressure readings available for review at appointment.   Patient confirmed appointment.   Are you having any problems with your medications? No   Do you have any concerns you like to discuss with the pharmacist? No   CCM referral has been placed prior to visit?  No    Star Rating Drugs: Medication:  Last Fill: Day Supply Atorvastatin 20mg  0/34/03 90 Trulicity 4.5mg   01/05/22 28 Glipizide 10mg  02/08/22 [facility], CPP notified  Avel Sensor, [place]  [phone]

## 2022-04-11 ENCOUNTER — Ambulatory Visit: Payer: Medicare HMO | Admitting: Pharmacist

## 2022-04-11 DIAGNOSIS — Z0100 Encounter for examination of eyes and vision without abnormal findings: Secondary | ICD-10-CM | POA: Diagnosis not present

## 2022-04-11 DIAGNOSIS — N1832 Chronic kidney disease, stage 3b: Secondary | ICD-10-CM

## 2022-04-11 DIAGNOSIS — E785 Hyperlipidemia, unspecified: Secondary | ICD-10-CM

## 2022-04-11 DIAGNOSIS — E1121 Type 2 diabetes mellitus with diabetic nephropathy: Secondary | ICD-10-CM

## 2022-04-11 DIAGNOSIS — I5032 Chronic diastolic (congestive) heart failure: Secondary | ICD-10-CM

## 2022-04-11 DIAGNOSIS — H25013 Cortical age-related cataract, bilateral: Secondary | ICD-10-CM | POA: Diagnosis not present

## 2022-04-11 DIAGNOSIS — I10 Essential (primary) hypertension: Secondary | ICD-10-CM

## 2022-04-11 DIAGNOSIS — F39 Unspecified mood [affective] disorder: Secondary | ICD-10-CM

## 2022-04-11 DIAGNOSIS — Z940 Kidney transplant status: Secondary | ICD-10-CM

## 2022-04-11 DIAGNOSIS — E119 Type 2 diabetes mellitus without complications: Secondary | ICD-10-CM | POA: Diagnosis not present

## 2022-04-11 DIAGNOSIS — I700 Atherosclerosis of aorta: Secondary | ICD-10-CM

## 2022-04-11 DIAGNOSIS — H43811 Vitreous degeneration, right eye: Secondary | ICD-10-CM | POA: Diagnosis not present

## 2022-04-11 LAB — HM DIABETES EYE EXAM

## 2022-04-11 NOTE — Patient Instructions (Signed)
Visit Information  Phone number for Pharmacist: [phone]   Goals Addressed   None     Care Plan : [place]  Updates made by Charlton Haws, RPH since 04/11/2022 12:00 AM     Problem: Hypertension, Hyperlipidemia, Diabetes, and Chronic Kidney Disease   Priority: High     Long-Range Goal: Disease mgmt   Start Date: 02/23/2021  Expected End Date: 01/11/2023  Recent Progress: On track  Priority: High  Note:   Current Barriers:  None identified  Pharmacist Clinical Goal(s):  Patient will contact provider office for questions/concerns as evidenced notation of same in electronic health record through collaboration with PharmD and provider.   Interventions: 1:1 collaboration with Venia Carbon, MD regarding development and update of comprehensive plan of care as evidenced by provider attestation and co-signature Inter-disciplinary care team collaboration (see longitudinal plan of care) Comprehensive medication review performed; medication list updated in electronic medical record  Hypertension / Heart Failure(BP goal <140/90) -Controlled - per home readings; pt does endorse dizziness with standing sometimes, he has discussed this with cardiology and they have advised no changes -Current home readings: 130-150s/60-70 -Last ejection fraction: 50-55% (04/05/21); previously 35-40% (02/08/20) -HF type: Combined Systolic and Diastolic; NYHA Class: II (slight limitation of activity) -Follows with cardiology (Dr Saralyn Pilar) -Current treatment: Carvedilol 6.25 mg BID - Appropriate, Effective, Safe, Accessible Losartan 25 mg daily - Appropriate, Effective, Safe, Accessible -Medications previously tried: hydralazine, amlodipine, metoprolol -Educated on BP goals and benefits of medications for prevention of heart attack, stroke and kidney damage; Proper BP monitoring technique -Reviewed hydration - pt has been told to drink 4-16oz bottles per day -Counseled to monitor  BP at home daily -Recommend to continue current medication  Hyperlipidemia: (LDL goal < 70) -Controlled - LDL 65 (09/2019) at goal; pt endorses compliance with statin -Hx aortic atherosclerosis -Current treatment: Atorvastatin 20 mg daily HS - Appropriate, Query Effective -Medications previously tried: none -Educated on Cholesterol goals;  -Recommended to continue current medication; Recommend updating lipid panel at next PCP visit (04/2022)  Diabetes (A1c goal <7%) -Query controlled - A1c 9.6% (11/2021), however control is improved based on home readings -Followed by endocrine, Dr. Ronny Flurry. Pt is participating in a research study at [facility] with remote monitoring -Current home glucose readings - checks twice daily 9 AM and 6 PM Fasting glucose: 120, 107, 113 Before supper: 150-180 -Denies hypoglycemic/hyperglycemic symptoms -Current medications: Trulicity 4.5 mg weekly (PAP) - Appropriate, Query Effective Glipizide 10 mg daily- Appropriate, Query Effective -Medications previously tried: insulin   -Educated on A1c and blood sugar goals; -Recommend to continue current medication  Hx Kidney Transplant / CKD Stage 3b (Goal: maintain kidney function) -All medications assessed for renal dosing and appropriateness in chronic kidney disease. -Follows with [facility] (s/p transplant 04/2020) -Current medications: Tacrolimus 1 mg - 4 tab BID - Appropriate, Effective, Safe, Accessible Mycophenolate 250 mg BID - Appropriate, Effective, Safe, Accessible -Recommend to continue current medication  Depression/Anxiety (Goal: manage symptoms) -Controlled - per pt report -PHQ9, GAD7 not on file -Connected with PCP for mental health support -Current treatment: Citalopram 20 mg daily - Appropriate, Effective, Safe, Accessible -Medications previously tried/failed: n/a -Educated on Benefits of medication for symptom control -Recommended to continue current medication  Patient Goals/Self-Care  Activities Patient will:  - take medications as prescribed as evidenced by patient report and record review focus on medication adherence by routine check glucose twice daily, document, and provide at future appointments collaborate with provider on medication access solutions  Patient verbalizes understanding of instructions and care plan provided today and agrees to view in [place]. Active MyChart status and patient understanding of how to access instructions and care plan via MyChart confirmed with patient.    Telephone follow up appointment with pharmacy team member scheduled for: 6 months  Charlene Brooke, PharmD, [facility] Clinical Pharmacist [facility] [phone]

## 2022-04-11 NOTE — Progress Notes (Signed)
Chronic Care Management Pharmacy Note  04/11/2022 Name:  Richard Chen MRN:  [id number] DOB:  May 09, 1956  Summary:  CCM F/U visit -Reviewed medications; pt affirms compliance; losartan was added a few months ago per cardiology, updated med list -HTN: home BP 130-150/60-70 per pt report; he does endorse dizziness upon standing occasionally, he has discussed with cardiology and they have advised no changes -DM: last A1c 9.6% (11/2021), home readings range 107-180 throughout the day so DM control has improved; pt is due for repeat A1c -HLD: Last LDL 103 from 08/2020 (per [place] records), above goal of < 70 (given high ASCVD risk (25%) and aortic atherosclerosis on imaging); pt is due for repeat lipid panel.  Recommendations/Changes made from today's visit: -Recommend A1c and lipid panel at next office visit -No med changes  Plan: -[place] will call patient 3 months for DM update -Pharmacist follow up televisit scheduled for 6 months -PCP F/U 04/27/22     Subjective: Richard Chen is an 66 y.o. year old male who is a primary patient of Venia Carbon, MD.  The CCM team was consulted for assistance with disease management and care coordination needs.    Engaged with patient by telephone for follow up visit in response to provider referral for pharmacy case management and/or care coordination services.   Consent to Services:  The patient was given information about Chronic Care Management services, agreed to services, and gave verbal consent prior to initiation of services.  Please see initial visit note for detailed documentation.   Patient Care Team: Venia Carbon, MD as PCP - General Lenord Fellers Cleaster Corin, [facility] as Pharmacist (Pharmacist)  Recent office visits: 12/22/21 Dr Silvio Pate OV: elevated BG PM; A1c 9.6%; increase glipizide to 5 mg AM. Recent referral to [facility] endocrine (closer than [facility]), however [facility] not accepting pts. Referral cancelled.  Recent  consult visits: 03/05/22 Dr Saralyn Pilar (Cardiology): f/u HF. Restarted Losartan 2-3 wks ago. No med changes.  02/02/22 NP [place] (GI): schedule colonoscopy. 01/31/22 Dr Saralyn Pilar (cardiology): f/u HF.  01/23/22 Dr Ronny Flurry (Endocrine): f/u DM. A1c unreliable due to anemia. Will switch Trulicity to Ozempic 1 mg.   12/14/21 Dr Tally Due ([facility]): Kidney Tx 04/2020. Refer to nutrition for wt loss. Sch f/u with endo ASAP.[name]  Hospital visits: None in previous 6 months   Objective:  Lab Results  Component Value Date   CREATININE 6.66 (H) 05/09/2020   BUN 37 (H) 05/09/2020   GFR 17.94 (L) 03/05/2016   GFRNONAA 8 (L) 05/09/2020   GFRAA 9 (L) 05/09/2020   NA 136 05/09/2020   K 3.7 05/09/2020   CALCIUM 8.1 (L) 05/09/2020   CO2 30 05/09/2020   GLUCOSE 151 (H) 05/09/2020    Lab Results  Component Value Date/Time   HGBA1C 7.0 (A) 04/20/2021 02:47 PM   HGBA1C 7.6 (A) 10/18/2020 10:07 AM   HGBA1C 6.2 (H) 02/07/2020 07:23 AM   HGBA1C 6.6 (H) 10/13/2019 10:00 AM   GFR 17.94 (L) 03/05/2016 12:39 PM   GFR 53.30 (L) 02/18/2015 12:25 PM   MICROALBUR 37.6 (H) 10/09/2011 10:18 AM   MICROALBUR 8.1 (H) 06/28/2009 09:24 AM    Last diabetic Eye exam:  Lab Results  Component Value Date/Time   HMDIABEYEEXA No Retinopathy 05/10/2020 12:00 AM    Last diabetic Foot exam:  Lab Results  Component Value Date/Time   HMDIABFOOTEX done 10/18/2020 12:00 AM     Lab Results  Component Value Date   CHOL 133 10/13/2019   HDL  42.90 10/13/2019   LDLCALC 65 10/13/2019   LDLDIRECT 166.5 10/26/2013   TRIG 125.0 10/13/2019   CHOLHDL 3 10/13/2019       Latest Ref Rng & Units 05/09/2020   10:16 AM 02/07/2020    7:23 AM 10/13/2019   10:00 AM  Hepatic Function  Total Protein 6.5 - 8.1 g/dL 7.9  7.6  6.8   Albumin 3.5 - 5.0 g/dL 4.1  3.7  3.7   AST 15 - 41 U/L _0 ALT 0 - 44 U/L _1 Alk Phosphatase 38 - 126 U/L 74  76  72   Total Bilirubin 0.3 - 1.2 mg/dL 0.7  0.8  0.3   Bilirubin,  Direct 0.0 - 0.3 mg/dL   0.1     Lab Results  Component Value Date/Time   TSH 1.79 03/26/2018 09:37 AM   TSH 1.10 07/14/2014 11:43 AM   FREET4 1.02 10/13/2019 10:00 AM   FREET4 1.04 03/26/2018 09:37 AM       Latest Ref Rng & Units 05/09/2020   10:16 AM 02/07/2020    7:23 AM 10/03/2019   10:55 AM  CBC  WBC 4.0 - 10.5 K/uL 7.7  6.6  6.8   Hemoglobin 13.0 - 17.0 g/dL 11.1  11.4  10.1   Hematocrit 39.0 - 52.0 % 33.7  37.0  32.2   Platelets 150 - 400 K/uL 146  161  152     No results found for: "VD25OH"  Clinical ASCVD: Yes  - aortic atherosclerosis The 10-year ASCVD risk score (Arnett DK, et al., 2019) is: 25.4%   Values used to calculate the score:     Age: 60 years     Sex: Male     Is Non-Hispanic African American: Yes     Diabetic: Yes     Tobacco smoker: No     Systolic Blood Pressure: 379 mmHg     Is BP treated: Yes     HDL Cholesterol: 46 mg/dL     Total Cholesterol: 181 mg/dL        No data to display            Social History   Tobacco Use  Smoking Status Never  Smokeless Tobacco Never   BP Readings from Last 3 Encounters:  12/22/21 130/82  10/27/21 124/74  08/31/21 (!) 149/73   Pulse Readings from Last 3 Encounters:  12/22/21 67  10/27/21 72  08/31/21 67   Wt Readings from Last 3 Encounters:  12/22/21 240 lb (108.9 kg)  10/27/21 239 lb (108.4 kg)  08/31/21 240 lb (108.9 kg)   BMI Readings from Last 3 Encounters:  12/22/21 32.10 kg/m  10/27/21 31.97 kg/m  08/31/21 31.66 kg/m    Assessment/Interventions: Review of patient past medical history, allergies, medications, health status, including review of consultants reports, laboratory and other test data, was performed as part of comprehensive evaluation and provision of chronic care management services.   SDOH:  (Social Determinants of Health) assessments and interventions performed: No  SDOH Screenings   Alcohol Screen: Not on file  Depression (KWI0-9): Not on file  Financial  Resource Strain: High Risk (09/18/2021)   Overall Financial Resource Strain (CARDIA)    Difficulty of Paying Living Expenses: Hard  Food Insecurity: No Food Insecurity (07/01/2019)   Hunger Vital Sign    Worried About Running Out of Food in the Last Year: Never true    Ran Out of Food in  the Last Year: Never true  Housing: Not on file  Physical Activity: Not on file  Social Connections: Unknown (07/01/2019)   Social Connection and Isolation Panel [NHANES]    Frequency of Communication with Friends and Family: More than three times a week    Frequency of Social Gatherings with Friends and Family: Not on file    Attends Religious Services: Not on file    Active Member of Clubs or Organizations: Not on file    Attends Archivist Meetings: Not on file    Marital Status: Married  Stress: No Stress Concern Present (07/01/2019)   [employer] [place]    Feeling of Stress : Only a little  Tobacco Use: Low Risk  (12/22/2021)   Patient History    Smoking Tobacco Use: Never    Smokeless Tobacco Use: Never    Passive Exposure: Not on file  Transportation Needs: No Transportation Needs (07/01/2019)   PRAPARE - Transportation    Lack of Transportation (Medical): No    Lack of Transportation (Non-Medical): No    CCM Care Plan  Allergies  Allergen Reactions   Aspirin Anaphylaxis   Shellfish Allergy Anaphylaxis   Clonidine Other (See Comments)    Gait imbalance/ dysfunction requiring wheelchair.  Increased lethargy.    Other Hives    Dye when viewed kidney (break out in knots)   Contrast Media [Iodinated Contrast Media] Rash and Hives    Medications Reviewed Today     Reviewed by Charlton Haws, [facility] (Pharmacist) on 04/11/22 at [place] List Status: <None>   Medication Order Taking? Sig Documenting Provider Last Dose Status Informant  Accu-Chek Softclix Lancets lancets [id number] Yes Use to obtain blood sugar dx code E11.29  Venia Carbon, MD Taking Active   acetaminophen (TYLENOL) 500 MG tablet [id number] Yes Take 500 mg by mouth every 6 (six) hours as needed (pain).  [provider] Taking Active Spouse/Significant Other  Alcohol Swabs (ALCOHOL PREP) PADS [id number] Yes 1 Units by Does not apply route daily. Venia Carbon, MD Taking Active Spouse/Significant Other  allopurinol (ZYLOPRIM) 100 MG tablet [id number] Yes Take 100 mg by mouth daily. [provider] Taking Active   atorvastatin (LIPITOR) 20 MG tablet [id number] Yes TAKE 1 TABLET EVERY DAY Venia Carbon, MD Taking Active   Blood Glucose Monitoring Suppl (ACCU-CHEK GUIDE) w/Device Drucie Opitz [id number] Yes USE AS DIRECTED Venia Carbon, MD Taking Active   carvedilol (COREG) 3.125 MG tablet [id number] Yes Take 3.125 mg by mouth 2 (two) times daily. [provider] Taking Active   Cholecalciferol (VITAMIN D3) 50 MCG (2000 UT) CAPS [id number] Yes Take by mouth. [provider] Taking Active   citalopram (CELEXA) 20 MG tablet [id number] Yes TAKE 1 TABLET EVERY DAY Venia Carbon, MD Taking Active   Dulaglutide 4.5 MG/0.5ML SOPN [id number] Yes Inject into the skin. Inject into skin weekly [provider] Taking Active   famotidine (PEPCID) 20 MG tablet [id number] Yes Take 20 mg by mouth daily. [provider] Taking Active   fexofenadine (ALLEGRA) 180 MG tablet [id number] Yes Take 180 mg daily by mouth. [provider] Taking Active Spouse/Significant Other  fluticasone (FLONASE) 50 MCG/ACT nasal spray [id number] Yes USE 2 SPRAYS IN EACH NOSTRIL EVERY DAY Venia Carbon, MD Taking Active   glipiZIDE (GLUCOTROL XL) 10 MG 24 hr tablet [id number] Yes Take 1 tablet (10 mg total) by mouth daily with breakfast. Venia Carbon, MD Taking Active  glucose blood (ACCU-CHEK GUIDE) test strip [id number] Yes Use to check blood sugar once a day Dx Code E11.29 Venia Carbon, MD Taking Active   losartan (COZAAR)  25 MG tablet [id number] Yes Take 25 mg by mouth at bedtime. Paraschos, Alexander, MD Taking Active   mycophenolate (CELLCEPT) 250 MG capsule [id number] Yes Take 250 mg by mouth. Take 1 in the morning and 1 at bedtime (08/18/21) [provider] Taking Active Self  predniSONE (DELTASONE) 5 MG tablet [id number] Yes 5 mg daily. [provider] Taking Active   senna-docusate (SENOKOT-S) 8.6-50 MG tablet [id number] Yes Take by mouth 2 (two) times daily as needed. [provider] Taking Active   sildenafil (VIAGRA) 100 MG tablet [id number] Yes Take 1 tablet (100 mg total) by mouth daily as needed for erectile dysfunction. Take two hours prior to intercourse on an empty stomach McGowan, Larene Beach A, PA-C Taking Active   tacrolimus (PROGRAF) 1 MG capsule [id number] Yes 4 tablets qAM and 4 tablets qPM changed 07/06/21 [provider] Taking Active Self  tamsulosin (FLOMAX) 0.4 MG CAPS capsule [id number] Yes TAKE 1 CAPSULE EVERY DAY McGowan, Hunt Oris, PA-C Taking Active             Patient Active Problem List   Diagnosis Date Noted   Kidney transplant status 10/18/2020   BPH without obstruction/lower urinary tract symptoms 10/18/2020   History of anemia due to chronic kidney disease 02/07/2020   Aortic atherosclerosis ([place]) 10/13/2019   Abdominal wall bulge 08/28/2017   Sinus bradycardia 02/06/2017   Pure hypercholesterolemia 06/21/2016   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[place]) 07/14/2014   Mood disorder ([place]) 01/12/2013   Routine general medical examination at a health care facility 10/14/2012   Asthma 06/08/2012   Obesity 06/08/2012   Gout 01/06/2010   Chronic diastolic heart failure ([place]) 11/18/2009   ERECTILE DYSFUNCTION, ORGANIC 02/09/2008   Type 2 diabetes mellitus with renal manifestations, controlled ([place]) 09/29/2007   HLD (hyperlipidemia) 06/20/2007   Essential hypertension 06/20/2007   ALLERGIC RHINITIS 06/20/2007    Immunization History   Administered Date(s) Administered   Hepatitis B, adult 09/25/2016, 10/23/2016, 11/23/2016, 03/22/2017, 05/22/2017, 06/22/2017, 06/27/2017, 07/24/2017, 08/24/2017, 11/27/2017, 08/20/2018   Influenza Split 07/18/2011, 08/22/2012   Influenza Whole 07/24/2007, 07/26/2008, 08/25/2009   Influenza, High Dose Seasonal PF 07/14/2018, 09/01/2020   Influenza, Seasonal, Injecte, Preservative Fre 07/04/2016   Influenza,inj,Quad PF,6+ Mos 07/16/2013, 07/14/2014, 08/23/2015   Influenza-Unspecified 08/06/2017, 07/22/2018   Moderna Sars-Covid-2 Vaccination 11/27/2019, 12/25/2019, 02/22/2021   PPD Test 03/26/2016, 04/09/2016, 03/22/2017, 04/04/2018, 04/03/2019, 03/25/2020   Pneumococcal Conjugate-13 04/05/2016   Pneumococcal Polysaccharide-23 10/09/2011, 04/13/2016, 03/06/2017   Td 06/28/2009   Tdap 12/08/2020   Zoster Recombinat (Shingrix) 12/31/2018, 05/14/2019   Zoster, Live 03/12/2016    Conditions to be addressed/monitored:  Hypertension, Hyperlipidemia, Diabetes, and Chronic Kidney Disease  Care Plan : [place]  Updates made by Charlton Haws, [place] since 04/11/2022 12:00 AM     Problem: Hypertension, Hyperlipidemia, Diabetes, and Chronic Kidney Disease   Priority: High     Long-Range Goal: Disease mgmt   Start Date: 02/23/2021  Expected End Date: 01/11/2023  Recent Progress: On track  Priority: High  Note:   Current Barriers:  None identified  Pharmacist Clinical Goal(s):  Patient will contact provider office for questions/concerns as evidenced notation of same in electronic health record through collaboration with PharmD and provider.   Interventions: 1:1 collaboration with Venia Carbon, MD regarding development and update of comprehensive plan of care  as evidenced by provider attestation and co-signature Inter-disciplinary care team collaboration (see longitudinal plan of care) Comprehensive medication review performed; medication list updated in electronic  medical record  Hypertension / Heart Failure(BP goal <140/90) -Controlled - per home readings; pt does endorse dizziness with standing sometimes, he has discussed this with cardiology and they have advised no changes -Current home readings: 130-150s/60-70 -Last ejection fraction: 50-55% (04/05/21); previously 35-40% (02/08/20) -HF type: Combined Systolic and Diastolic; NYHA Class: II (slight limitation of activity) -Follows with cardiology (Dr Saralyn Pilar) -Current treatment: Carvedilol 6.25 mg BID - Appropriate, Effective, Safe, Accessible Losartan 25 mg daily - Appropriate, Effective, Safe, Accessible -Medications previously tried: hydralazine, amlodipine, metoprolol -Educated on BP goals and benefits of medications for prevention of heart attack, stroke and kidney damage; Proper BP monitoring technique -Reviewed hydration - pt has been told to drink 4-16oz bottles per day -Counseled to monitor BP at home daily -Recommend to continue current medication  Hyperlipidemia: (LDL goal < 70) -Query controlled - LDL 103 (08/2020 - [facility]) above goal; pt endorses compliance with statin -Hx aortic atherosclerosis; ASCVD risk 25.4% - high -Current treatment: Atorvastatin 20 mg daily HS - Appropriate, Query Effective -Medications previously tried: none -Educated on Cholesterol goals - more aggressive LDL goal < 70 chosen due to high ASCVD risk and aortic atherosclerosis on imaging -Recommended to continue current medication; Recommend updating lipid panel at next PCP visit (04/2022)  Diabetes (A1c goal <7%) -Query controlled - A1c 9.6% (11/2021), however control is improved based on home readings -Followed by endocrine, Dr. Ronny Flurry. Pt is participating in a research study at [facility] with remote monitoring -Current home glucose readings - checks twice daily 9 AM and 6 PM Fasting glucose: 120, 107, 113 Before supper: 150-180 -Denies hypoglycemic/hyperglycemic symptoms -Current medications: Trulicity 4.5 mg  weekly (PAP) - Appropriate, Query Effective Glipizide 10 mg daily- Appropriate, Query Effective -Medications previously tried: insulin   -Educated on A1c and blood sugar goals; -Recommend to continue current medication  Hx Kidney Transplant / CKD Stage 3b (Goal: maintain kidney function) -All medications assessed for renal dosing and appropriateness in chronic kidney disease. -Follows with [facility] (s/p transplant 04/2020) -Current medications: Tacrolimus 1 mg - 4 tab BID - Appropriate, Effective, Safe, Accessible Mycophenolate 250 mg BID - Appropriate, Effective, Safe, Accessible -Recommend to continue current medication  Depression/Anxiety (Goal: manage symptoms) -Controlled - per pt report -PHQ9, GAD7 not on file -Connected with PCP for mental health support -Current treatment: Citalopram 20 mg daily - Appropriate, Effective, Safe, Accessible -Medications previously tried/failed: n/a -Educated on Benefits of medication for symptom control -Recommended to continue current medication  Patient Goals/Self-Care Activities Patient will:  - take medications as prescribed as evidenced by patient report and record review focus on medication adherence by routine check glucose twice daily, document, and provide at future appointments collaborate with provider on medication access solutions     Medication Assistance:  [place] cares approved 2023 (via [facility] endocrine)  Compliance/Adherence/Medication fill history: Care Gaps: Foot exam (due 10/18/21) Eye exam (due 05/10/21)  Star-Rating Drugs: Atorvastatin - PDC 100% Glipizide - PDC 65%; LF 05/30/97 x 90 ds Trulicity - PDC inaccurate; [employer] Cares Losartan - PDC 100%  Medication Access: Within the past 30 days, how often has patient missed a dose of medication? 0 Is a pillbox or other method used to improve adherence? Yes  Factors that may affect medication adherence? no barriers identified Are meds synced by current  pharmacy? No  Are meds delivered by current pharmacy?  Yes  Does patient experience delays in picking up medications due to transportation concerns? No   [facility] Services Reviewed: Is patient disadvantaged to use [facility]?: Yes  Current Rx insurance plan: [employer] [place] Name and location of Current pharmacy:  [facility] [address] [place] - [place] [address] Phone: [phone] Fax: [phone]  [facility] [place] [address] Phone: [phone] Fax: [phone]  [facility] services reviewed with patient today?: No  Patient requests to transfer care to [facility]?: No  Reason patient declined to change pharmacies: Disadvantaged due to insurance/mail order   Care Plan and Follow Up Patient Decision:  Patient agrees to Care Plan and Follow-up.  Plan: Telephone follow up appointment with care management team member scheduled for:  6 months  Charlene Brooke, PharmD, BCACP Clinical Pharmacist [facility] at [facility] [phone]

## 2022-04-16 DIAGNOSIS — Z940 Kidney transplant status: Secondary | ICD-10-CM | POA: Diagnosis not present

## 2022-04-16 DIAGNOSIS — Z5181 Encounter for therapeutic drug level monitoring: Secondary | ICD-10-CM | POA: Diagnosis not present

## 2022-04-20 ENCOUNTER — Emergency Department
Admission: EM | Admit: 2022-04-20 | Discharge: 2022-04-20 | Disposition: A | Discharge status: Home / Self Care | Payer: Medicare HMO | Attending: Emergency Medicine | Admitting: Emergency Medicine

## 2022-04-20 ENCOUNTER — Emergency Department: Payer: Medicare HMO

## 2022-04-20 ENCOUNTER — Other Ambulatory Visit: Payer: Self-pay

## 2022-04-20 DIAGNOSIS — M1612 Unilateral primary osteoarthritis, left hip: Secondary | ICD-10-CM | POA: Diagnosis not present

## 2022-04-20 DIAGNOSIS — S299XXA Unspecified injury of thorax, initial encounter: Secondary | ICD-10-CM | POA: Diagnosis present

## 2022-04-20 DIAGNOSIS — M79652 Pain in left thigh: Secondary | ICD-10-CM | POA: Diagnosis not present

## 2022-04-20 DIAGNOSIS — S2242XA Multiple fractures of ribs, left side, initial encounter for closed fracture: Secondary | ICD-10-CM | POA: Diagnosis not present

## 2022-04-20 DIAGNOSIS — W19XXXA Unspecified fall, initial encounter: Secondary | ICD-10-CM

## 2022-04-20 DIAGNOSIS — E119 Type 2 diabetes mellitus without complications: Secondary | ICD-10-CM | POA: Diagnosis not present

## 2022-04-20 DIAGNOSIS — Z940 Kidney transplant status: Secondary | ICD-10-CM | POA: Insufficient documentation

## 2022-04-20 DIAGNOSIS — S8012XA Contusion of left lower leg, initial encounter: Secondary | ICD-10-CM

## 2022-04-20 DIAGNOSIS — S20212A Contusion of left front wall of thorax, initial encounter: Secondary | ICD-10-CM | POA: Diagnosis not present

## 2022-04-20 DIAGNOSIS — Y92009 Unspecified place in unspecified non-institutional (private) residence as the place of occurrence of the external cause: Secondary | ICD-10-CM | POA: Insufficient documentation

## 2022-04-20 DIAGNOSIS — I509 Heart failure, unspecified: Secondary | ICD-10-CM | POA: Diagnosis not present

## 2022-04-20 DIAGNOSIS — I110 Hypertensive heart disease with heart failure: Secondary | ICD-10-CM | POA: Insufficient documentation

## 2022-04-20 DIAGNOSIS — W01198A Fall on same level from slipping, tripping and stumbling with subsequent striking against other object, initial encounter: Secondary | ICD-10-CM | POA: Insufficient documentation

## 2022-04-20 NOTE — ED Provider Triage Note (Signed)
Emergency Medicine Provider Triage Evaluation Note  Richard Chen , a 66 y.o. male  was evaluated in triage.  Pt complains of rib and left upper thigh pain after mechanical, non-syncopal fall while carrying a [place] bike down a flight of stairs. He fell down 9 steps. Denies striking his head or loss of consciousness. No neck or back pain.[name] Physical Exam  There were no vitals taken for this visit. Gen:   Awake, no distress   Resp:  Normal effort  MSK:   Moves extremities without difficulty  Other:    Medical Decision Making  Medically screening exam initiated at 2:36 PM.  Appropriate orders placed.  Richard Chen was informed that the remainder of the evaluation will be completed by another provider, this initial triage assessment does not replace that evaluation, and the importance of remaining in the ED until their evaluation is complete.   Richard Chen, [place] 04/20/22 1437

## 2022-04-20 NOTE — ED Provider Notes (Signed)
[facility] Emergency Department Provider Note     Event Date/Time   First MD Initiated Contact with Patient 04/20/22 1553     (approximate)   History   Fall   HPI  Richard Chen is a 66 y.o. male who is a kidney transplant patient, hypertension, diabetes, CHF, presenting with complaints of injury following mechanical fall.  Patient reportedly slipped and fell hitting his left chest wall and left leg.  Denies any head injury or LOC.  Patient denies any blood thinner use.     Physical Exam   Triage Vital Signs: ED Triage Vitals  Enc Vitals Group     BP 04/20/22 1436 (!) 145/80     Pulse Rate 04/20/22 1436 64     Resp 04/20/22 1436 18     Temp 04/20/22 1436 97.8 F (36.6 C)     Temp Source 04/20/22 1436 Oral     SpO2 04/20/22 1436 98 %     Weight 04/20/22 1437 235 lb (106.6 kg)     Height 04/20/22 1437 '6\' 1"'$  (1.854 m)     Head Circumference --      Peak Flow --      Pain Score 04/20/22 1437 5     Pain Loc --      Pain Edu? --      Excl. in [place]? --     Most recent vital signs: Vitals:   04/20/22 1436  BP: (!) 145/80  Pulse: 64  Resp: 18  Temp: 97.8 F (36.6 C)  SpO2: 98%    General Awake, no distress. NAD CV:  Good peripheral perfusion.  RESP:  Normal effort.  ABD:  No distention.  MSK:  Normal active range of motion of all extremities.   ED Results / Procedures / Treatments   Labs (all labs ordered are listed, but only abnormal results are displayed) Labs Reviewed - No data to display   EKG   RADIOLOGY  I personally viewed and evaluated these images as part of my medical decision making, as well as reviewing the written report by the radiologist.  ED Provider Interpretation: no acute findings}  DG Femur Min 2 Views Left  Result Date: 04/20/2022 CLINICAL DATA:  Pain post fall EXAM: LEFT FEMUR 2 VIEWS COMPARISON:  CT pelvis 10/03/2019 FINDINGS: Chronic fracture deformity of the inferior left ischiopubic ramus. No  acute fracture or dislocation. There is narrowing of the articular cartilage in the medial compartment of the knee. Marginal spurring from the medial tibial plateau, and lateral femoral condyle. No effusion. Extensive femoropopliteal arterial calcifications. IMPRESSION: 1. No acute findings. 2. Degenerative changes as above Electronically Signed   By: Lucrezia Europe M.D.   On: 04/20/2022 15:11   DG Ribs Unilateral W/Chest Left  Result Date: 04/20/2022 CLINICAL DATA:  Status post fall.  Left-sided rib pain. EXAM: LEFT RIBS AND CHEST - 3+ VIEW COMPARISON:  None Available. FINDINGS: No acute fracture or other bone lesions are seen involving the ribs. Multiple old left posterior rib fractures. There is no evidence of pneumothorax or pleural effusion. Both lungs are clear. Heart size and mediastinal contours are within normal limits. IMPRESSION: 1. No acute osseous injury of the left ribs. 2. No acute cardiopulmonary disease. Electronically Signed   By: Kathreen Devoid M.D.   On: 04/20/2022 15:09     PROCEDURES:  Critical Care performed: No  Procedures   MEDICATIONS ORDERED IN ED: Medications - No data to display   IMPRESSION /  MDM / ASSESSMENT AND PLAN / ED COURSE  I reviewed the triage vital signs and the nursing notes.                              Differential diagnosis includes, but is not limited to, as rib contusion, rib fracture, pneumothorax, fracture, hematoma, contusion  Patient's presentation is most consistent with acute complicated illness / injury requiring diagnostic workup.  Patient's diagnosis is consistent with chemical fall resulting in chest wall injury and leg contusion.  Patient without any radiologic evidence of any acute fracture, pneumothorax, or rib injury based on my review of images.  Patient will be discharged home with prescriptions for directions to take OTC Tylenol. Patient is to follow up with his primary provider as needed or otherwise directed. Patient is given ED  precautions to return to the ED for any worsening or new symptoms.    FINAL CLINICAL IMPRESSION(S) / ED DIAGNOSES   Final diagnoses:  Fall in home, initial encounter  Chest wall contusion, left, initial encounter  Contusion of left leg, initial encounter     Rx / DC Orders   ED Discharge Orders     None        Note:  This document was prepared using Dragon voice recognition software and may include unintentional dictation errors.    Melvenia Needles, PA-C 04/20/22 1627    Lucrezia Starch, MD 04/20/22 [phone]

## 2022-04-20 NOTE — Discharge Instructions (Addendum)
Your exam and x-rays are normal and reassuring at this time.  No signs of serious injury related to your fall.  Take OTC Tylenol as needed.  You may also use your over-the-counter topical pain relieving gel as needed.

## 2022-04-20 NOTE — ED Notes (Signed)
Dc ppw provided. Questions, followup and rx information reviewed as needed. pt declines vs and provides verbal consent at this time. Pt alert and oriented to lobby.

## 2022-04-20 NOTE — ED Triage Notes (Signed)
Pt reports he was helping his daughter move and fell forward and hit is left side of rib and left leg. Pt denies LOC or head trauma. Pt denies blood thinners.   Pt is kidney transplant pt.

## 2022-04-23 ENCOUNTER — Telehealth: Payer: Self-pay

## 2022-04-23 NOTE — Telephone Encounter (Signed)
Called to see how pt was doing after recent ER visit on 04-20-22 for a fall. He said he is doing okay. Just a bit sore.

## 2022-04-27 ENCOUNTER — Ambulatory Visit (INDEPENDENT_AMBULATORY_CARE_PROVIDER_SITE_OTHER): Payer: Medicare HMO | Admitting: Internal Medicine

## 2022-04-27 ENCOUNTER — Encounter: Payer: Self-pay | Admitting: Internal Medicine

## 2022-04-27 VITALS — BP 138/82 | HR 72 | Wt 239.6 lb

## 2022-04-27 DIAGNOSIS — I5032 Chronic diastolic (congestive) heart failure: Secondary | ICD-10-CM

## 2022-04-27 DIAGNOSIS — E1121 Type 2 diabetes mellitus with diabetic nephropathy: Secondary | ICD-10-CM | POA: Diagnosis not present

## 2022-04-27 DIAGNOSIS — I10 Essential (primary) hypertension: Secondary | ICD-10-CM | POA: Diagnosis not present

## 2022-04-27 LAB — POCT GLYCOSYLATED HEMOGLOBIN (HGB A1C): Hemoglobin A1C: 8 % — AB (ref 4.0–5.6)

## 2022-04-27 MED ORDER — GLIPIZIDE ER 5 MG PO TB24
5.0000 mg | ORAL_TABLET | Freq: Two times a day (BID) | ORAL | 3 refills | Status: DC
Start: 2022-04-27 — End: 2022-04-27

## 2022-04-27 MED ORDER — GLIPIZIDE 5 MG PO TABS
5.0000 mg | ORAL_TABLET | Freq: Every day | ORAL | 3 refills | Status: DC
Start: 2022-04-27 — End: 2022-05-25

## 2022-04-27 NOTE — Assessment & Plan Note (Signed)
BP Readings from Last 3 Encounters:  04/27/22 138/82  04/20/22 (!) 145/80  12/22/21 130/82   Still okay without the losartan
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
## 2022-04-27 NOTE — Assessment & Plan Note (Signed)
Lab Results  Component Value Date   HGBA1C 8.0 (A) 04/27/2022   Much better now On dulaglutide 4.'5mg'$  weekly--has really helped his appetite Some low sugars in AM---they have been holding the glipizide 10 Will change to short acting '5mg'$  bid---hold in AM if sugars under 100

## 2022-04-27 NOTE — Assessment & Plan Note (Signed)
Fluid neutral He stopped the losartan due to severe orthostatic dizziness This was appropriate Is on carvedilol 3.125 bid

## 2022-05-03 ENCOUNTER — Encounter: Payer: Self-pay | Admitting: *Deleted

## 2022-05-04 ENCOUNTER — Encounter: Payer: Self-pay | Admitting: *Deleted

## 2022-05-04 ENCOUNTER — Ambulatory Visit: Payer: Medicare HMO | Admitting: Anesthesiology

## 2022-05-04 ENCOUNTER — Ambulatory Visit
Admission: RE | Admit: 2022-05-04 | Discharge: 2022-05-04 | Disposition: A | Discharge status: Home / Self Care | Payer: Medicare HMO | Attending: Gastroenterology | Admitting: Gastroenterology

## 2022-05-04 ENCOUNTER — Encounter
Admission: RE | Disposition: A | Discharge status: Home / Self Care | Payer: Self-pay | Source: Home / Self Care | Attending: Gastroenterology

## 2022-05-04 DIAGNOSIS — F32A Depression, unspecified: Secondary | ICD-10-CM | POA: Insufficient documentation

## 2022-05-04 DIAGNOSIS — Z940 Kidney transplant status: Secondary | ICD-10-CM | POA: Insufficient documentation

## 2022-05-04 DIAGNOSIS — J45909 Unspecified asthma, uncomplicated: Secondary | ICD-10-CM | POA: Insufficient documentation

## 2022-05-04 DIAGNOSIS — M109 Gout, unspecified: Secondary | ICD-10-CM | POA: Insufficient documentation

## 2022-05-04 DIAGNOSIS — I509 Heart failure, unspecified: Secondary | ICD-10-CM | POA: Insufficient documentation

## 2022-05-04 DIAGNOSIS — D759 Disease of blood and blood-forming organs, unspecified: Secondary | ICD-10-CM | POA: Diagnosis not present

## 2022-05-04 DIAGNOSIS — E1122 Type 2 diabetes mellitus with diabetic chronic kidney disease: Secondary | ICD-10-CM | POA: Diagnosis not present

## 2022-05-04 DIAGNOSIS — Z7985 Long-term (current) use of injectable non-insulin antidiabetic drugs: Secondary | ICD-10-CM | POA: Diagnosis not present

## 2022-05-04 DIAGNOSIS — Z1211 Encounter for screening for malignant neoplasm of colon: Secondary | ICD-10-CM | POA: Insufficient documentation

## 2022-05-04 DIAGNOSIS — I132 Hypertensive heart and chronic kidney disease with heart failure and with stage 5 chronic kidney disease, or end stage renal disease: Secondary | ICD-10-CM | POA: Insufficient documentation

## 2022-05-04 DIAGNOSIS — Z992 Dependence on renal dialysis: Secondary | ICD-10-CM | POA: Diagnosis not present

## 2022-05-04 DIAGNOSIS — K648 Other hemorrhoids: Secondary | ICD-10-CM | POA: Diagnosis not present

## 2022-05-04 DIAGNOSIS — D124 Benign neoplasm of descending colon: Secondary | ICD-10-CM | POA: Insufficient documentation

## 2022-05-04 DIAGNOSIS — K640 First degree hemorrhoids: Secondary | ICD-10-CM | POA: Insufficient documentation

## 2022-05-04 DIAGNOSIS — K6389 Other specified diseases of intestine: Secondary | ICD-10-CM | POA: Insufficient documentation

## 2022-05-04 DIAGNOSIS — N186 End stage renal disease: Secondary | ICD-10-CM | POA: Diagnosis not present

## 2022-05-04 DIAGNOSIS — Z87820 Personal history of traumatic brain injury: Secondary | ICD-10-CM | POA: Diagnosis not present

## 2022-05-04 DIAGNOSIS — K635 Polyp of colon: Secondary | ICD-10-CM | POA: Diagnosis not present

## 2022-05-04 DIAGNOSIS — Z86010 Personal history of colonic polyps: Secondary | ICD-10-CM | POA: Diagnosis not present

## 2022-05-04 DIAGNOSIS — D649 Anemia, unspecified: Secondary | ICD-10-CM | POA: Diagnosis not present

## 2022-05-04 HISTORY — DX: Unspecified asthma, uncomplicated: J45.909

## 2022-05-04 HISTORY — DX: Unspecified osteoarthritis, unspecified site: M19.90

## 2022-05-04 HISTORY — PX: COLONOSCOPY WITH PROPOFOL: SHX5780

## 2022-05-04 SURGERY — COLONOSCOPY WITH PROPOFOL
Anesthesia: General

## 2022-05-04 MED ORDER — FENTANYL CITRATE (PF) 100 MCG/2ML IJ SOLN
INTRAMUSCULAR | Status: AC
  Filled 2022-05-04: qty 2

## 2022-05-04 MED ORDER — PROPOFOL 1000 MG/100ML IV EMUL
INTRAVENOUS | Status: AC
  Filled 2022-05-04: qty 100

## 2022-05-04 MED ORDER — PROPOFOL 500 MG/50ML IV EMUL
INTRAVENOUS | Status: DC | PRN
  Administered 2022-05-04: 150 ug/kg/min via INTRAVENOUS

## 2022-05-04 MED ORDER — EPHEDRINE SULFATE (PRESSORS) 50 MG/ML IJ SOLN
INTRAMUSCULAR | Status: DC | PRN
  Administered 2022-05-04: 7.5 mg via INTRAVENOUS

## 2022-05-04 MED ORDER — LIDOCAINE HCL (CARDIAC) PF 100 MG/5ML IV SOSY
PREFILLED_SYRINGE | INTRAVENOUS | Status: DC | PRN
  Administered 2022-05-04: 50 mg via INTRAVENOUS

## 2022-05-04 MED ORDER — FENTANYL CITRATE (PF) 100 MCG/2ML IJ SOLN
INTRAMUSCULAR | Status: DC | PRN
  Administered 2022-05-04: 50 ug via INTRAVENOUS

## 2022-05-04 MED ORDER — SODIUM CHLORIDE 0.9 % IV SOLN
INTRAVENOUS | Status: DC
Start: 2022-05-04 — End: 2022-05-04

## 2022-05-04 MED ORDER — SUCCINYLCHOLINE CHLORIDE 200 MG/10ML IV SOSY
PREFILLED_SYRINGE | INTRAVENOUS | Status: DC | PRN
  Administered 2022-05-04: 120 mg via INTRAVENOUS

## 2022-05-04 MED ORDER — PROPOFOL 10 MG/ML IV BOLUS
INTRAVENOUS | Status: DC | PRN
  Administered 2022-05-04: 160 mg via INTRAVENOUS

## 2022-05-04 NOTE — Anesthesia Procedure Notes (Signed)
Procedure Name: Intubation Date/Time: 05/04/2022 8:49 AM  Performed by: Johnna Acosta, CRNAPre-anesthesia Checklist: Patient identified, Emergency Drugs available, Suction available, Patient being monitored and Timeout performed Patient Re-evaluated:Patient Re-evaluated prior to induction Oxygen Delivery Method: Circle system utilized Preoxygenation: Pre-oxygenation with 100% oxygen Induction Type: IV induction, Rapid sequence and Cricoid Pressure applied Laryngoscope Size: McGraph and 3 Grade View: Grade II Tube type: Oral Tube size: 7.0 mm Number of attempts: 1 Airway Equipment and Method: Stylet and Video-laryngoscopy Placement Confirmation: ETT inserted through vocal cords under direct vision, positive ETCO2 and breath sounds checked- equal and bilateral Secured at: 19 cm Tube secured with: Tape Dental Injury: Teeth and Oropharynx as per pre-operative assessment
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
## 2022-05-04 NOTE — Anesthesia Postprocedure Evaluation (Signed)
Anesthesia Post Note  Patient: Richard Chen  Procedure(s) Performed: COLONOSCOPY WITH PROPOFOL  Patient location during evaluation: PACU Anesthesia Type: General Level of consciousness: awake and alert, oriented and patient cooperative Pain management: pain level controlled Vital Signs Assessment: post-procedure vital signs reviewed and stable Respiratory status: spontaneous breathing, nonlabored ventilation and respiratory function stable Cardiovascular status: blood pressure returned to baseline and stable Postop Assessment: adequate PO intake Anesthetic complications: no   No notable events documented.   Last Vitals:  Vitals:   05/04/22 0952 05/04/22 1000  BP: (!) 144/63   Pulse: 72   Resp: 16 15  Temp:    SpO2: 100% 100%    Last Pain:  Vitals:   05/04/22 0937  TempSrc: Temporal  PainSc:                  Darrin Nipper

## 2022-05-04 NOTE — Transfer of Care (Signed)
Immediate Anesthesia Transfer of Care Note  Patient: Richard Chen  Procedure(s) Performed: COLONOSCOPY WITH PROPOFOL  Patient Location: PACU  Anesthesia Type:General  Level of Consciousness: awake and alert   Airway & Oxygen Therapy: Patient Spontanous Breathing and Patient connected to nasal cannula oxygen  Post-op Assessment: Report given to RN and Post -op Vital signs reviewed and stable  Post vital signs: Reviewed and stable  Last Vitals:  Vitals Value Taken Time  BP 148/69 05/04/22 0930  Temp    Pulse 79 05/04/22 0930  Resp 17 05/04/22 0930  SpO2 100 % 05/04/22 0930    Last Pain:  Vitals:   05/04/22 0816  TempSrc: Temporal  PainSc: 0-No pain         Complications: No notable events documented.

## 2022-05-04 NOTE — Interval H&P Note (Signed)
History and Physical Interval Note:  05/04/2022 8:44 AM  Richard Chen  has presented today for surgery, with the diagnosis of HX OF ADENOMATOUS POLYP OF COLON.  The various methods of treatment have been discussed with the patient and family. After consideration of risks, benefits and other options for treatment, the patient has consented to  Procedure(s) with comments: COLONOSCOPY WITH PROPOFOL (N/A) - DM as a surgical intervention.  The patient's history has been reviewed, patient examined, no change in status, stable for surgery.  I have reviewed the patient's chart and labs.  Questions were answered to the patient's satisfaction.     Lesly Rubenstein  Ok to proceed with colonoscopy

## 2022-05-04 NOTE — Anesthesia Preprocedure Evaluation (Addendum)
Anesthesia Evaluation  Patient identified by MRN, date of birth, ID band Patient awake    Reviewed: Allergy & Precautions, NPO status , Patient's Chart, lab work & pertinent test results  History of Anesthesia Complications Negative for: history of anesthetic complications  Airway Mallampati: IV   Neck ROM: Full    Dental  (+) Upper Dentures, Lower Dentures   Pulmonary asthma ,    Pulmonary exam normal breath sounds clear to auscultation       Cardiovascular hypertension, +CHF  Normal cardiovascular exam Rhythm:Regular Rate:Normal  Echo 04/05/21:  NORMAL LEFT VENTRICULAR SYSTOLIC FUNCTION WITH AN ESTIMATED EF = 50-55 %  NORMAL RIGHT VENTRICULAR SYSTOLIC FUNCTION  TRIVIAL REGURGITATION NOTED  NO VALVULAR STENOSIS  MILD RV ENLARGEMENT  MILD BIATRIAL ENLARGEMENT  MILD LVH    Neuro/Psych  Headaches, PSYCHIATRIC DISORDERS Anxiety Depression Hx TBI with memory impairment    GI/Hepatic negative GI ROS,   Endo/Other  diabetes, Type 2Obesity; on Trulicity, last dose 05/27/87  Renal/GU Renal disease (ESRD s/p kidney transplant 05/14/20)     Musculoskeletal  (+) Arthritis , Gout    Abdominal   Peds  Hematology  (+) Blood dyscrasia, anemia ,   Anesthesia Other Findings Cardiology note 03/05/22:  66 y.o. male with  1. Essential hypertension  2. Chronic diastolic heart failure (CMS-HCC)  3. SOB (shortness of breath) on exertion  4. Pure hypercholesterolemia  5. S/P kidney transplant   66 year old gentleman initially referred for preoperative cardiovascular assessment prior to kidney transplant, without chest pain, with normal stress echocardiogram. The patient has essential hypertension, blood pressure mildly elevated today. After restarting losartan 2-3 weeks ago, he initially experienced dizziness, weakness, and shortness of breath, which has resolved in the last 6 days. The patient was hospitalized for fluid overload, with  diagnosis of acute on chronic systolic/diastolic congestive heart failure, improved after emergent dialysis. 2D echocardiogram revealed mild to moderate reduced left ventricular function, which was reduced from previous echo in 2020. He is status post kidney transplant 04/2020. The patient denies feeling fluid-overloaded. Repeat 2D echocardiogram 04/05/2021 revealed normal LV function with LVEF 50-55%.  Plan   1. Continue current medications 2. Counseled patient about low-sodium diet 3. DASH diet printed instructions given to the patient 4. Recommend Mediterranean Diet 5. Continue atorvastatin for hyperlipidemia management 6. Continue BP diary 7. Return to clinic for follow-up in 4 months   Reproductive/Obstetrics                            Anesthesia Physical Anesthesia Plan  ASA: 3  Anesthesia Plan: General   Post-op Pain Management:    Induction: Intravenous and Rapid sequence  PONV Risk Score and Plan: 2 and Propofol infusion, TIVA and Treatment may vary due to age or medical condition  Airway Management Planned: Oral ETT  Additional Equipment:   Intra-op Plan:   Post-operative Plan: Extubation in OR  Informed Consent: I have reviewed the patients History and Physical, chart, labs and discussed the procedure including the risks, benefits and alternatives for the proposed anesthesia with the patient or authorized representative who has indicated his/her understanding and acceptance.     Dental advisory given  Plan Discussed with: CRNA  Anesthesia Plan Comments: (Patient consented for risks of anesthesia including but not limited to:  - adverse reactions to medications - damage to eyes, teeth, lips or other oral mucosa - nerve damage due to positioning  - sore throat or hoarseness - damage to heart,  brain, nerves, lungs, other parts of body or loss of life  Informed patient about role of CRNA in peri- and intra-operative care.  Patient voiced  understanding.)       Anesthesia Quick Evaluation

## 2022-05-04 NOTE — Op Note (Signed)
[facility] Gastroenterology Patient Name: Richard Chen Procedure Date: 05/04/2022 8:24 AM MRN: [id number] Account #: [id number] Date of Birth: [birth year] Admit Type: Outpatient Age: 66 Room: [facility] ENDO ROOM 3 Gender: Male Note Status: Finalized Instrument Name: Jasper Riling [id number] Procedure:             Colonoscopy Indications:           Surveillance: Personal history of adenomatous polyps                         on last colonoscopy > 5 years ago Providers:             Andrey Farmer MD, MD Referring MD:          Venia Carbon (Referring MD) Medicines:             Monitored Anesthesia Care Complications:         No immediate complications. Estimated blood loss:                         Minimal. Procedure:             Pre-Anesthesia Assessment:                        - Prior to the procedure, a History and Physical was                         performed, and patient medications and allergies were                         reviewed. The patient is competent. The risks and                         benefits of the procedure and the sedation options and                         risks were discussed with the patient. All questions                         were answered and informed consent was obtained.                         Patient identification and proposed procedure were                         verified by the physician, the nurse, the                         anesthesiologist, the anesthetist and the technician                         in the endoscopy suite. Mental Status Examination:                         alert and oriented. Airway Examination: normal                         oropharyngeal airway and neck mobility. Respiratory  Examination: clear to auscultation. CV Examination:                         normal. Prophylactic Antibiotics: The patient does not                         require prophylactic antibiotics. Prior                          Anticoagulants: The patient has taken no previous                         anticoagulant or antiplatelet agents. ASA Grade                         Assessment: III - A patient with severe systemic                         disease. After reviewing the risks and benefits, the                         patient was deemed in satisfactory condition to                         undergo the procedure. The anesthesia plan was to use                         general anesthesia. Immediately prior to                         administration of medications, the patient was                         re-assessed for adequacy to receive sedatives. The                         heart rate, respiratory rate, oxygen saturations,                         blood pressure, adequacy of pulmonary ventilation, and                         response to care were monitored throughout the                         procedure. The physical status of the patient was                         re-assessed after the procedure.                        After obtaining informed consent, the colonoscope was                         passed under direct vision. Throughout the procedure,                         the patient's blood pressure, pulse, and oxygen  saturations were monitored continuously. The                         Colonoscope was introduced through the anus and                         advanced to the the cecum, identified by appendiceal                         orifice and ileocecal valve. The colonoscopy was                         performed without difficulty. The patient tolerated                         the procedure well. The quality of the bowel                         preparation was good. Findings:      The perianal and digital rectal examinations were normal.      A diffuse area of moderate melanosis was found in the entire colon.      A 3 mm polyp was found in the descending colon. The polyp was sessile.        The polyp was removed with a cold snare. Resection and retrieval were       complete. Estimated blood loss was minimal.      Internal hemorrhoids were found during retroflexion. The hemorrhoids       were Grade I (internal hemorrhoids that do not prolapse).      The exam was otherwise without abnormality on direct and retroflexion       views. Impression:            - Melanosis in the colon.                        - One 3 mm polyp in the descending colon, removed with                         a cold snare. Resected and retrieved.                        - Internal hemorrhoids.                        - The examination was otherwise normal on direct and                         retroflexion views. Recommendation:        - Discharge patient to home.                        - Resume previous diet.                        - Continue present medications.                        - Await pathology results.                        -  Repeat colonoscopy in 7 years for surveillance.                        - Return to referring physician as previously                         scheduled. Procedure Code(s):     --- Professional ---                        [phone], Colonoscopy, flexible; with removal of                         tumor(s), polyp(s), or other lesion(s) by snare                         technique Diagnosis Code(s):     --- Professional ---                        Z86.010, Personal history of colonic polyps                        K63.89, Other specified diseases of intestine                        K63.5, Polyp of colon                        K64.0, First degree hemorrhoids CPT copyright 2019 American [employer]. All rights reserved. The codes documented in this report are preliminary and upon coder review may  be revised to meet current compliance requirements. Andrey Farmer MD, MD 05/04/2022 9:18:15 AM Number of Addenda: 0 Note Initiated On: 05/04/2022 8:24 AM Scope Withdrawal  Time: 0 hours 9 minutes 51 seconds  Total Procedure Duration: 0 hours 15 minutes 42 seconds  Estimated Blood Loss:  Estimated blood loss was minimal.      [facility]

## 2022-05-07 ENCOUNTER — Encounter: Payer: Self-pay | Admitting: Gastroenterology

## 2022-05-07 LAB — SURGICAL PATHOLOGY

## 2022-05-16 ENCOUNTER — Other Ambulatory Visit: Payer: Self-pay

## 2022-05-16 NOTE — Patient Outreach (Signed)
[facility]) Care Management  05/16/2022  Richard Chen 05/07/1956 [id number]   Telephone Screen    Outreach call to patient to introduce [facility] services and assess care needs as part of benefit of PCP office and insurance plan. Noted in EMR patient active and engaged with transplant CM program.   Main healthcare issue/concern today: None reported by patient. Reviewed health maintenance measures.      Plan: RN CM will close case.   Enzo Montgomery, RN,BSN,CCM [place] [facility] Telephonic Care Management Coordinator Direct Phone: [phone] Toll Free: [phone] Fax: [phone]

## 2022-05-25 ENCOUNTER — Other Ambulatory Visit: Payer: Self-pay | Admitting: Internal Medicine

## 2022-05-25 MED ORDER — GLIPIZIDE 5 MG PO TABS: 5.0000 mg | ORAL_TABLET | Freq: Two times a day (BID) | ORAL | 3 refills | Status: DC

## 2022-06-06 DIAGNOSIS — R0602 Shortness of breath: Secondary | ICD-10-CM | POA: Diagnosis not present

## 2022-06-06 DIAGNOSIS — I700 Atherosclerosis of aorta: Secondary | ICD-10-CM | POA: Diagnosis not present

## 2022-06-06 DIAGNOSIS — E7800 Pure hypercholesterolemia, unspecified: Secondary | ICD-10-CM | POA: Diagnosis not present

## 2022-06-06 DIAGNOSIS — R600 Localized edema: Secondary | ICD-10-CM | POA: Diagnosis not present

## 2022-06-06 DIAGNOSIS — R001 Bradycardia, unspecified: Secondary | ICD-10-CM | POA: Diagnosis not present

## 2022-06-06 DIAGNOSIS — I161 Hypertensive emergency: Secondary | ICD-10-CM | POA: Diagnosis not present

## 2022-06-06 DIAGNOSIS — Z940 Kidney transplant status: Secondary | ICD-10-CM | POA: Diagnosis not present

## 2022-06-06 DIAGNOSIS — E1122 Type 2 diabetes mellitus with diabetic chronic kidney disease: Secondary | ICD-10-CM | POA: Diagnosis not present

## 2022-06-06 DIAGNOSIS — I10 Essential (primary) hypertension: Secondary | ICD-10-CM | POA: Diagnosis not present

## 2022-06-15 DIAGNOSIS — D849 Immunodeficiency, unspecified: Secondary | ICD-10-CM | POA: Diagnosis not present

## 2022-06-15 DIAGNOSIS — Z940 Kidney transplant status: Secondary | ICD-10-CM | POA: Diagnosis not present

## 2022-07-04 ENCOUNTER — Telehealth: Payer: Self-pay

## 2022-07-04 NOTE — Chronic Care Management (AMB) (Signed)
Chronic Care Management Pharmacy Assistant   Name: Richard Chen  MRN: [id number] DOB: Jul 01, 1956  Reason for Encounter:Diabetes Disease State   Recent office visits:  04/27/22-Richard Letvak,MD(PCP)-f/u DM-recently restarted on losartan 68m daily-Now he is having significant dizziness with this--now better since stopping it (only taking it if his BP goes up a lot)This was appropriate f/u 6 months  Recent consult visits:  06/15/22-Richard Ellis,MD(Kidney Transplant)-The episodes of dizziness that are orthostatic may be a side off of the Flomax. Would consider coming off of it to help with these symptoms if possible. Reduce Stool Softener to once a day,labs , f/u 6 months  Hospital visits:  None in previous 6 months 05/04/22-Richard GalasLocklear,MD(gastro)-colonoscopy- [facility] fall,xrays, contusion, discharged to home . Medications: Outpatient Encounter Medications as of 07/04/2022  Medication Sig   Accu-Chek Softclix Lancets lancets Use to obtain blood sugar dx code E11.29   acetaminophen (TYLENOL) 500 MG tablet Take 500 mg by mouth every 6 (six) hours as needed (pain).    Alcohol Swabs (ALCOHOL PREP) PADS 1 Units by Does not apply route daily.   allopurinol (ZYLOPRIM) 100 MG tablet Take 100 mg by mouth daily.   atorvastatin (LIPITOR) 20 MG tablet TAKE 1 TABLET EVERY DAY   Blood Glucose Monitoring Suppl (ACCU-CHEK GUIDE) w/Device KIT USE AS DIRECTED   carvedilol (COREG) 3.125 MG tablet Take 3.125 mg by mouth 2 (two) times daily.   Cholecalciferol (VITAMIN D3) 50 MCG (2000 UT) CAPS Take by mouth.   citalopram (CELEXA) 20 MG tablet TAKE 1 TABLET EVERY DAY   Dulaglutide 4.5 MG/0.5ML SOPN Inject into the skin. Inject into skin weekly   famotidine (PEPCID) 20 MG tablet Take 20 mg by mouth daily.   fexofenadine (ALLEGRA) 180 MG tablet Take 180 mg daily by mouth.   fluticasone (FLONASE) 50 MCG/ACT nasal spray USE 2 SPRAYS IN EACH NOSTRIL EVERY DAY    glipiZIDE (GLUCOTROL) 5 MG tablet Take 1 tablet (5 mg total) by mouth 2 (two) times daily before a meal.   glucose blood (ACCU-CHEK GUIDE) test strip Use to check blood sugar once a day Dx Code E11.29   losartan (COZAAR) 25 MG tablet Take 25 mg by mouth daily.   mycophenolate (CELLCEPT) 250 MG capsule Take 250 mg by mouth. Take 1 in the morning and 1 at bedtime (08/18/21)   omeprazole (PRILOSEC) 20 MG capsule Take 20 mg by mouth daily.   predniSONE (DELTASONE) 5 MG tablet 5 mg daily. (Patient not taking: Reported on 05/04/2022)   senna-docusate (SENOKOT-S) 8.6-50 MG tablet Take by mouth 2 (two) times daily as needed.   sildenafil (VIAGRA) 100 MG tablet Take 1 tablet (100 mg total) by mouth daily as needed for erectile dysfunction. Take two hours prior to intercourse on an empty stomach   tacrolimus (PROGRAF) 1 MG capsule 4 tablets qAM and 4 tablets qPM changed 07/06/21   tamsulosin (FLOMAX) 0.4 MG CAPS capsule TAKE 1 CAPSULE EVERY DAY   No facility-administered encounter medications on file as of 07/04/2022.     Recent Relevant Labs: Lab Results  Component Value Date/Time   HGBA1C 8.0 (A) 04/27/2022 09:05 AM   HGBA1C 7.0 (A) 04/20/2021 02:47 PM   HGBA1C 6.2 (H) 02/07/2020 07:23 AM   HGBA1C 6.6 (H) 10/13/2019 10:00 AM   MICROALBUR 37.6 (H) 10/09/2011 10:18 AM   MICROALBUR 8.1 (H) 06/28/2009 09:24 AM    Kidney Function Lab Results  Component Value Date/Time   CREATININE 6.66 (H) 05/09/2020  10:16 AM   CREATININE 10.07 (H) 02/12/2020 03:36 AM   GFR 17.94 (L) 03/05/2016 12:39 PM   GFRNONAA 8 (L) 05/09/2020 10:16 AM   GFRAA 9 (L) 05/09/2020 10:16 AM     Contacted patient on 07/06/22 to discuss diabetes disease state.   Current antihyperglycemic regimen:  Trulicity 4.5 mg weekly (PAP)  Glipizide 84m - take 1 tablet am, take 1 tablet pm    Patient verbally confirms he is taking the above medications as directed. Yes  What diet changes have been made to improve diabetes control?  Patient drinks plenty water,chooses grilled meats, still enjoys potatoes in smaller portions   What recent interventions/DTPs have been made to improve glycemic control:  Patient is kidney transplant patient. Patient has not resumed losartan due to continued dizziness. Will change to short acting Glipizide 529mbid---hold in AM if sugars under 100 Patient receiving Trulicity through manufacturer  Have there been any recent hospitalizations or ED visits since last visit with CPP? Yes  05/04/22- [facility]  colonoscopy. 04/20/22-[facility] ED- fall, chest wall contusion and leg contusion. No admission   Patient denies hypoglycemic symptoms, including Pale, Sweaty, Shaky, Hungry, Nervous/irritable, and Vision changes  Patient denies hyperglycemic symptoms, including blurry vision, excessive thirst, fatigue, polyuria, and weakness  How often are you checking your blood sugar? twice daily  What are your blood sugars ranging?  Fasting: 06/22/22-131,137,112,129,143,118,125,108,120,116,136 After meals: 06/22/22-255,251,124,193,135,237,204,180   During the week, how often does your blood glucose drop below 70? Never  Are you checking your feet daily/regularly? Yes  Adherence Review: Is the patient currently on a STATIN medication? Yes Is the patient currently on ACE/ARB medication? Yes Does the patient have >5 day gap between last estimated fill dates? No  Care Gaps: Annual wellness visit in last year? Yes Most recent A1C reading:7.8  06/15/22 Most Recent BP reading:124/70  06/15/22  Last eye exam / retinopathy screening:UTD Last diabetic foot exam:UTD  Counseled patient on importance of annual eye and foot exam. UTD  Summary of recommendations from last [facility] [place] (Date:04/11/22) Summary:  CCM F/U visit -Reviewed medications; pt affirms compliance; losartan was added a few months ago per cardiology, updated med list -HTN: home BP 130-150/60-70 per pt report; he does endorse dizziness upon  standing occasionally, he has discussed with cardiology and they have advised no changes -DM: last A1c 9.6% (11/2021), home readings range 107-180 throughout the day so DM control has improved; pt is due for repeat A1c -HLD: Last LDL 103 from 08/2020 (per [facility]), above goal of < 70 (given high ASCVD risk (25%) and aortic atherosclerosis on imaging); pt is due for repeat lipid panel.   Recommendations/Changes made from today's visit: -Recommend A1c and lipid panel at next office visit -No med changes  Star Rating Drugs:  Medication:  Last Fill: Day Supply Atorvastatin 2061JU/10/23/2288 Trulicity   manufacturer Glipizide 41m32m8/16/23 90 Losartan 241m28m7/23  90  CCM appointment on 10/11/22  LindCharlene BrookeP notified  [place] [phone]

## 2022-07-18 ENCOUNTER — Other Ambulatory Visit: Payer: Medicare HMO

## 2022-07-18 ENCOUNTER — Telehealth: Payer: Self-pay

## 2022-07-18 NOTE — Telephone Encounter (Addendum)
Started [employer] on [employer]. Case number [id number]. Pt is aware to look for an email. Also, handicap placard form is signed and up front ready for pickup. Pt is aware.

## 2022-07-18 NOTE — Telephone Encounter (Signed)
Pt and wife would like to do pt assistance for his Trulicity. I will work on that and let them know what I need.

## 2022-08-20 ENCOUNTER — Encounter (INDEPENDENT_AMBULATORY_CARE_PROVIDER_SITE_OTHER): Payer: Self-pay

## 2022-08-22 ENCOUNTER — Other Ambulatory Visit: Payer: Self-pay

## 2022-08-22 DIAGNOSIS — N138 Other obstructive and reflux uropathy: Secondary | ICD-10-CM

## 2022-08-28 ENCOUNTER — Other Ambulatory Visit: Payer: Medicare HMO

## 2022-08-28 DIAGNOSIS — N401 Enlarged prostate with lower urinary tract symptoms: Secondary | ICD-10-CM | POA: Diagnosis not present

## 2022-08-28 DIAGNOSIS — N138 Other obstructive and reflux uropathy: Secondary | ICD-10-CM

## 2022-08-29 LAB — PSA: Prostate Specific Ag, Serum: 0.6 ng/mL (ref 0.0–4.0)
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
## 2022-08-31 ENCOUNTER — Ambulatory Visit: Payer: Medicare HMO | Admitting: Urology

## 2022-08-31 VITALS — BP 138/73 | HR 68 | Ht 73.0 in | Wt 231.0 lb

## 2022-08-31 DIAGNOSIS — N138 Other obstructive and reflux uropathy: Secondary | ICD-10-CM | POA: Diagnosis not present

## 2022-08-31 DIAGNOSIS — R319 Hematuria, unspecified: Secondary | ICD-10-CM

## 2022-08-31 DIAGNOSIS — N5201 Erectile dysfunction due to arterial insufficiency: Secondary | ICD-10-CM | POA: Diagnosis not present

## 2022-08-31 DIAGNOSIS — N401 Enlarged prostate with lower urinary tract symptoms: Secondary | ICD-10-CM | POA: Diagnosis not present

## 2022-08-31 LAB — MICROSCOPIC EXAMINATION

## 2022-08-31 LAB — URINALYSIS, COMPLETE
Bilirubin, UA: NEGATIVE
Glucose, UA: NEGATIVE
Ketones, UA: NEGATIVE
Leukocytes,UA: NEGATIVE
Nitrite, UA: NEGATIVE
Protein,UA: NEGATIVE
RBC, UA: NEGATIVE
Specific Gravity, UA: 1.015 (ref 1.005–1.030)
Urobilinogen, Ur: 0.2 mg/dL (ref 0.2–1.0)
pH, UA: 5 (ref 5.0–7.5)

## 2022-08-31 NOTE — Patient Instructions (Signed)
Discontinue the tamsulosin (Flomax)

## 2022-09-02 ENCOUNTER — Encounter: Payer: Self-pay | Admitting: Urology

## 2022-09-18 DIAGNOSIS — Z794 Long term (current) use of insulin: Secondary | ICD-10-CM | POA: Diagnosis not present

## 2022-09-18 DIAGNOSIS — E119 Type 2 diabetes mellitus without complications: Secondary | ICD-10-CM | POA: Diagnosis not present

## 2022-09-18 DIAGNOSIS — Z940 Kidney transplant status: Secondary | ICD-10-CM | POA: Diagnosis not present

## 2022-09-18 DIAGNOSIS — Z5181 Encounter for therapeutic drug level monitoring: Secondary | ICD-10-CM | POA: Diagnosis not present

## 2022-09-24 ENCOUNTER — Other Ambulatory Visit: Payer: Self-pay | Admitting: Urology

## 2022-09-24 DIAGNOSIS — N401 Enlarged prostate with lower urinary tract symptoms: Secondary | ICD-10-CM
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
## 2022-10-05 ENCOUNTER — Encounter: Payer: Self-pay | Admitting: Urology

## 2022-10-05 ENCOUNTER — Ambulatory Visit: Payer: Medicare HMO | Admitting: Urology

## 2022-10-05 VITALS — BP 151/72 | HR 65 | Ht 73.0 in | Wt 232.0 lb

## 2022-10-05 DIAGNOSIS — R319 Hematuria, unspecified: Secondary | ICD-10-CM

## 2022-10-05 DIAGNOSIS — N401 Enlarged prostate with lower urinary tract symptoms: Secondary | ICD-10-CM | POA: Diagnosis not present

## 2022-10-05 DIAGNOSIS — N138 Other obstructive and reflux uropathy: Secondary | ICD-10-CM | POA: Diagnosis not present

## 2022-10-05 DIAGNOSIS — N5201 Erectile dysfunction due to arterial insufficiency: Secondary | ICD-10-CM | POA: Diagnosis not present

## 2022-10-05 LAB — BLADDER SCAN AMB NON-IMAGING

## 2022-10-08 ENCOUNTER — Telehealth: Payer: Self-pay

## 2022-10-08 NOTE — Chronic Care Management (AMB) (Signed)
Chronic Care Management Pharmacy Assistant   Name: LANG ZINGG  MRN: [id number] DOB: 27-Feb-1956  Reason for Encounter: Reminder Call    Hospital visits:  None in previous 6 months 7/14/23Lysbeth Galas Locklear,MD(gastro)-colonoscopy- no admission  [facility] 04/20/22-[facility] ED)- fall,xrays, contusion, discharged to home Medications: Outpatient Encounter Medications as of 10/08/2022  Medication Sig Note   Accu-Chek Softclix Lancets lancets Use to obtain blood sugar dx code E11.29    acetaminophen (TYLENOL) 500 MG tablet Take 500 mg by mouth every 6 (six) hours as needed (pain).     Alcohol Swabs (ALCOHOL PREP) PADS 1 Units by Does not apply route daily.    allopurinol (ZYLOPRIM) 100 MG tablet Take 100 mg by mouth daily.    atorvastatin (LIPITOR) 20 MG tablet TAKE 1 TABLET EVERY DAY    Blood Glucose Monitoring Suppl (ACCU-CHEK GUIDE) w/Device KIT USE AS DIRECTED    carvedilol (COREG) 3.125 MG tablet Take 3.125 mg by mouth 2 (two) times daily.    Cholecalciferol (VITAMIN D3) 50 MCG (2000 UT) CAPS Take by mouth.    citalopram (CELEXA) 20 MG tablet TAKE 1 TABLET EVERY DAY    Dulaglutide 4.5 MG/0.5ML SOPN Inject into the skin. Inject into skin weekly    famotidine (PEPCID) 20 MG tablet Take 20 mg by mouth daily.    fexofenadine (ALLEGRA) 180 MG tablet Take 180 mg daily by mouth.    fluticasone (FLONASE) 50 MCG/ACT nasal spray USE 2 SPRAYS IN EACH NOSTRIL EVERY DAY    glipiZIDE (GLUCOTROL) 5 MG tablet Take 1 tablet (5 mg total) by mouth 2 (two) times daily before a meal.    glucose blood (ACCU-CHEK GUIDE) test strip Use to check blood sugar once a day Dx Code E11.29    losartan (COZAAR) 25 MG tablet Take 25 mg by mouth daily.    mycophenolate (CELLCEPT) 250 MG capsule Take 250 mg by mouth. Take 1 in the morning and 1 at bedtime (08/18/21)    omeprazole (PRILOSEC) 20 MG capsule Take 20 mg by mouth daily.    predniSONE (DELTASONE) 5 MG tablet 5 mg daily.    senna-docusate  (SENOKOT-S) 8.6-50 MG tablet Take by mouth 2 (two) times daily as needed.    sildenafil (VIAGRA) 100 MG tablet Take 1 tablet (100 mg total) by mouth daily as needed for erectile dysfunction. Take two hours prior to intercourse on an empty stomach    tacrolimus (PROGRAF) 1 MG capsule 4 tablets qAM and 4 tablets qPM changed 07/06/21    tamsulosin (FLOMAX) 0.4 MG CAPS capsule TAKE 1 CAPSULE EVERY DAY (Patient not taking: Reported on 10/05/2022) 10/05/2022: On hold for 1 month   No facility-administered encounter medications on file as of 10/08/2022.    Asia Favata Kappes was contacted to remind of upcoming telephone visit with Charlene Brooke on 10/11/22 at 58:45. Patient was reminded to have any blood glucose and blood pressure readings available for review at appointment.   Patient confirmed appointment.  Are you having any problems with your medications? No Patient reports off tamsulosin per Urology 10/05/22.  Do you have any concerns you like to discuss with the pharmacist? Yes Patient reports his afternoon BG's are running high 200's and he is concerned.  10/07/22- am fasting 119   CCM referral has been placed prior to visit?  No   Star Rating Drugs: Medication:  Last Fill: Day Supply Atorvastatin 88LN 79/7/28 90 Trulicity   manufacturer Glipizide 37m  09/03/22 90 Losartan 271m stopped the losartan  due to severe orthostatic       Dizziness.     04/27/22  Charlene Brooke, CPP notified  Avel Sensor, [place]  [phone]

## 2022-10-11 ENCOUNTER — Ambulatory Visit: Payer: Medicare HMO | Admitting: Pharmacist

## 2022-10-11 NOTE — Progress Notes (Signed)
Chronic Care Management Pharmacy Note  10/11/2022 Name:  Richard Chen MRN:  [id number] DOB:  [birth year]  Summary:  CCM F/U visit -DM: A1c 8.1% (08/2022 - [facility] records), home readings range 130-215 before breakfast and dinner; reviewed 24-hr diet recall and identified carbs; he does report he is no longer seeing endocrine and would like PCP to manage DM -HLD: Last LDL 103 from 08/2020 (per [facility] records), above goal of < 70 (given high ASCVD risk (25%) and aortic atherosclerosis on imaging); pt is due for repeat lipid panel. -HTN: BP reasonable at home, 138/70 today  Recommendations/Changes made from today's visit: -No med changes; renew Trulicity PAP for 8502 -Advised to increase protein/vegetable in diet, reduce carbs -Recommend lipid panel at CPE  Plan: -Pharmacist follow up televisit scheduled for 2 months -PCP appt 10/31/22 (CPE)    Subjective: Richard Chen is an 66 y.o. year old male who is a primary patient of Venia Carbon, MD.  The CCM team was consulted for assistance with disease management and care coordination needs.    Engaged with patient by telephone for follow up visit in response to provider referral for pharmacy case management and/or care coordination services.   Consent to Services:  The patient was given information about Chronic Care Management services, agreed to services, and gave verbal consent prior to initiation of services.  Please see initial visit note for detailed documentation.   Patient Care Team: Venia Carbon, MD as PCP - General Lenord Fellers Cleaster Corin, [facility] as Pharmacist (Pharmacist)  Recent office visits: 04/27/22 Dr Silvio Pate OV: f/u - A1c 8.0%; d/c losartan (dizziness). Change glipizide 10 to short acting glipizide 5 mg.  12/22/21 Dr Silvio Pate OV: elevated BG PM; A1c 9.6%; increase glipizide to 5 mg AM. Recent referral to [facility] endocrine (closer than [facility]), however [facility] not accepting pts. Referral cancelled.  Recent consult  visits: 06/15/22 Dr Lysle Rubens (Transplant): advised d/w urologist - flomax may be contributing to orthostasis. Reduce stool softener to once a day.  06/06/22 Dr Saralyn Pilar (Cardiology): f/u - no changes.  03/05/22 Dr Saralyn Pilar (Cardiology): f/u HF. Restarted Losartan 2-3 wks ago. No med changes.  02/02/22 NP [place] (GI): schedule colonoscopy. 01/31/22 Dr Saralyn Pilar (cardiology): f/u HF.  01/23/22 Dr Ronny Flurry (Endocrine): f/u DM. A1c unreliable due to anemia. Will switch Trulicity to Ozempic 1 mg.   12/14/21 Dr Tally Due ([facility]): Kidney Tx 04/2020. Refer to nutrition for wt loss. Sch f/u with endo ASAP.[name]  Hospital visits: 04/20/22 ED visit [facility]): Fall in home. Workup wnl. F/u pcp.   Objective:  Lab Results  Component Value Date   CREATININE 6.66 (H) 05/09/2020   BUN 37 (H) 05/09/2020   GFR 17.94 (L) 03/05/2016   GFRNONAA 8 (L) 05/09/2020   GFRAA 9 (L) 05/09/2020   NA 136 05/09/2020   K 3.7 05/09/2020   CALCIUM 8.1 (L) 05/09/2020   CO2 30 05/09/2020   GLUCOSE 151 (H) 05/09/2020    Lab Results  Component Value Date/Time   HGBA1C 8.0 (A) 04/27/2022 09:05 AM   HGBA1C 7.0 (A) 04/20/2021 02:47 PM   HGBA1C 6.2 (H) 02/07/2020 07:23 AM   HGBA1C 6.6 (H) 10/13/2019 10:00 AM   GFR 17.94 (L) 03/05/2016 12:39 PM   GFR 53.30 (L) 02/18/2015 12:25 PM   MICROALBUR 37.6 (H) 10/09/2011 10:18 AM   MICROALBUR 8.1 (H) 06/28/2009 09:24 AM    Last diabetic Eye exam:  Lab Results  Component Value Date/Time   HMDIABEYEEXA No Retinopathy 04/11/2022 12:00 AM  Last diabetic Foot exam:  Lab Results  Component Value Date/Time   HMDIABFOOTEX done 10/18/2020 12:00 AM     Lab Results  Component Value Date   CHOL 133 10/13/2019   HDL 42.90 10/13/2019   LDLCALC 65 10/13/2019   LDLDIRECT 166.5 10/26/2013   TRIG 125.0 10/13/2019   CHOLHDL 3 10/13/2019       Latest Ref Rng & Units 05/09/2020   10:16 AM 02/07/2020    7:23 AM 10/13/2019   10:00 AM  Hepatic Function  Total Protein 6.5 - 8.1 g/dL  7.9  7.6  6.8   Albumin 3.5 - 5.0 g/dL 4.1  3.7  3.7   AST 15 - 41 U/L _0 ALT 0 - 44 U/L _1 Alk Phosphatase 38 - 126 U/L 74  76  72   Total Bilirubin 0.3 - 1.2 mg/dL 0.7  0.8  0.3   Bilirubin, Direct 0.0 - 0.3 mg/dL   0.1     Lab Results  Component Value Date/Time   TSH 1.79 03/26/2018 09:37 AM   TSH 1.10 07/14/2014 11:43 AM   FREET4 1.02 10/13/2019 10:00 AM   FREET4 1.04 03/26/2018 09:37 AM       Latest Ref Rng & Units 05/09/2020   10:16 AM 02/07/2020    7:23 AM 10/03/2019   10:55 AM  CBC  WBC 4.0 - 10.5 K/uL 7.7  6.6  6.8   Hemoglobin 13.0 - 17.0 g/dL 11.1  11.4  10.1   Hematocrit 39.0 - 52.0 % 33.7  37.0  32.2   Platelets 150 - 400 K/uL 146  161  152     No results found for: "VD25OH"  Clinical ASCVD: Yes  - aortic atherosclerosis The 10-year ASCVD risk score (Arnett DK, et al., 2019) is: 34.9%   Values used to calculate the score:     Age: 86 years     Sex: Male     Is Non-Hispanic African American: Yes     Diabetic: Yes     Tobacco smoker: No     Systolic Blood Pressure: 993 mmHg     Is BP treated: Yes     HDL Cholesterol: 46 mg/dL     Total Cholesterol: 181 mg/dL     Social History   Tobacco Use  Smoking Status Never  Smokeless Tobacco Never   BP Readings from Last 3 Encounters:  10/11/22 138/70  10/05/22 (!) 151/72  08/31/22 138/73   Pulse Readings from Last 3 Encounters:  10/05/22 65  08/31/22 68  05/04/22 72   Wt Readings from Last 3 Encounters:  10/05/22 232 lb (105.2 kg)  08/31/22 231 lb (104.8 kg)  05/04/22 232 lb (105.2 kg)   BMI Readings from Last 3 Encounters:  10/05/22 30.61 kg/m  08/31/22 30.48 kg/m  05/04/22 30.61 kg/m    Assessment/Interventions: Review of patient past medical history, allergies, medications, health status, including review of consultants reports, laboratory and other test data, was performed as part of comprehensive evaluation and provision of chronic care management services.   SDOH:   (Social Determinants of Health) assessments and interventions performed: Yes SDOH Interventions    Flowsheet Row Chronic Care Management from 10/11/2022 in [place] at [place] Management from 09/12/2021 in [place] at [place] Management from 02/23/2021 in [place] at [place] Management from 08/31/2020 in [place] at [place]  SDOH Interventions  Housing Interventions Intervention Not Indicated -- -- --  Transportation Interventions Intervention Not Indicated -- -- --  Financial Strain Interventions Other (Comment)  [Trulicity PAP] Other (Comment)  [Renew assistance for Trulicity 3 mg for 6160] Other (Comment)  [Apply for Trulicity assistance] Intervention Not Indicated      SDOH Screenings   Food Insecurity: No Food Insecurity (07/01/2019)  Housing: Low Risk  (10/11/2022)  Transportation Needs: No Transportation Needs (10/11/2022)  Financial Resource Strain: High Risk (10/11/2022)  Social Connections: Unknown (07/01/2019)  Stress: No Stress Concern Present (07/01/2019)  Tobacco Use: Low Risk  (10/05/2022)    CCM Care Plan  Allergies  Allergen Reactions   Aspirin Anaphylaxis   Shellfish Allergy Anaphylaxis   Clonidine Other (See Comments)    Gait imbalance/ dysfunction requiring wheelchair.  Increased lethargy.    Other Hives    Dye when viewed kidney (break out in knots)   Contrast Media [Iodinated Contrast Media] Rash and Hives    Medications Reviewed Today     Reviewed by Charlton Haws, [facility] (Pharmacist) on 10/11/22 at 0904  Med List Status: <None>   Medication Order Taking? Sig Documenting Provider Last Dose Status Informant  Accu-Chek Softclix Lancets lancets [id number] Yes Use to obtain blood sugar dx code E11.29 Venia Carbon, MD Taking Active   acetaminophen (TYLENOL) 500 MG tablet [id number] Yes Take 500 mg by mouth every 6 (six) hours as needed (pain).  [provider] Taking Active Spouse/Significant Other  Alcohol Swabs (ALCOHOL PREP) PADS [id number] Yes 1 Units by Does not apply route daily. Venia Carbon, MD Taking Active Spouse/Significant Other  allopurinol (ZYLOPRIM) 100 MG tablet [id number] Yes Take 100 mg by mouth daily. [provider] Taking Active   atorvastatin (LIPITOR) 20 MG tablet [id number] Yes TAKE 1 TABLET EVERY DAY Venia Carbon, MD Taking Active   Blood Glucose Monitoring Suppl (ACCU-CHEK GUIDE) w/Device Drucie Opitz [id number] Yes USE AS DIRECTED Venia Carbon, MD Taking Active   carvedilol (COREG) 3.125 MG tablet [id number] Yes Take 3.125 mg by mouth 2 (two) times daily. [provider] Taking Active   Cholecalciferol (VITAMIN D3) 50 MCG (2000 UT) CAPS [id number] Yes Take by mouth. [provider] Taking Active   citalopram (CELEXA) 20 MG tablet [id number] Yes TAKE 1 TABLET EVERY DAY Venia Carbon, MD Taking Active   Dulaglutide 4.5 MG/0.5ML SOPN [id number] Yes Inject 4.5 mg into the skin once a week. Inject into skin weekly [provider] Taking Active   famotidine (PEPCID) 20 MG tablet [id number] Yes Take 20 mg by mouth daily. [provider] Taking Active   fexofenadine (ALLEGRA) 180 MG tablet [id number] Yes Take 180 mg daily by mouth. [provider] Taking Active Spouse/Significant Other  fluticasone (FLONASE) 50 MCG/ACT nasal spray [id number] Yes USE 2 SPRAYS IN EACH NOSTRIL EVERY DAY Venia Carbon, MD Taking Active   glipiZIDE (GLUCOTROL) 5 MG tablet [id number] Yes Take 1 tablet (5 mg total) by mouth 2 (two) times daily before a meal. Venia Carbon, MD Taking Active   glucose blood (ACCU-CHEK GUIDE) test strip [id number] Yes Use to check blood sugar once a day Dx Code E11.29 Venia Carbon, MD Taking Active   mycophenolate (CELLCEPT) 250 MG capsule [id number] Yes Take 250 mg by mouth. Take 1 in the morning and 1 at bedtime (08/18/21) [provider] Taking  Active Self  omeprazole (PRILOSEC) 20 MG capsule [id number] Yes Take 20 mg by mouth daily. [provider] Taking Active  predniSONE (DELTASONE) 5 MG tablet [id number] Yes 5 mg daily. [provider] Taking Active Self  senna-docusate (SENOKOT-S) 8.6-50 MG tablet [id number] Yes Take by mouth 2 (two) times daily as needed. [provider] Taking Active   sildenafil (VIAGRA) 100 MG tablet [id number] Yes Take 1 tablet (100 mg total) by mouth daily as needed for erectile dysfunction. Take two hours prior to intercourse on an empty stomach McGowan, Larene [place] A, PA-C Taking Active   tacrolimus (PROGRAF) 1 MG capsule [id number] Yes 4 tablets qAM and 4 tablets qPM changed 07/06/21 [provider] Taking Active Self  tamsulosin (FLOMAX) 0.4 MG CAPS capsule [id number] No TAKE 1 CAPSULE EVERY DAY  Patient not taking: Reported on 10/11/2022   Nori Riis, PA-C Not Taking Active            Med Note Georgiann Cocker, ANASTASIYA V   Fri Oct 05, 2022 10:22 AM) On hold for 1 month            Patient Active Problem List   Diagnosis Date Noted   Kidney transplant status 10/18/2020   BPH without obstruction/lower urinary tract symptoms 10/18/2020   History of anemia due to chronic kidney disease 02/07/2020   Aortic atherosclerosis ([place]) 10/13/2019   Abdominal wall bulge 08/28/2017   Sinus bradycardia 02/06/2017   Pure hypercholesterolemia 06/21/2016   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[place]) 07/14/2014   Mood disorder ([place]) 01/12/2013   Routine general medical examination at a health care facility 10/14/2012   Asthma 06/08/2012   Obesity 06/08/2012   Gout 01/06/2010   Chronic diastolic heart failure ([place]) 11/18/2009   ERECTILE DYSFUNCTION, ORGANIC 02/09/2008   Type 2 diabetes mellitus with renal manifestations, controlled ([place]) 09/29/2007   HLD (hyperlipidemia) 06/20/2007   Essential hypertension 06/20/2007   ALLERGIC RHINITIS 06/20/2007    Immunization  History  Administered Date(s) Administered   Hepatitis B, adult 09/25/2016, 10/23/2016, 11/23/2016, 03/22/2017, 05/22/2017, 06/22/2017, 06/27/2017, 07/24/2017, 08/24/2017, 11/27/2017, 08/20/2018   Influenza Split 07/18/2011, 08/22/2012   Influenza Whole 07/24/2007, 07/26/2008, 08/25/2009   Influenza, High Dose Seasonal PF 07/14/2018, 09/01/2020   Influenza, Seasonal, Injecte, Preservative Fre 07/04/2016   Influenza,inj,Quad PF,6+ Mos 07/16/2013, 07/14/2014, 08/23/2015   Influenza-Unspecified 08/06/2017, 07/22/2018   Moderna Sars-Covid-2 Vaccination 11/27/2019, 12/25/2019, 09/01/2020, 02/22/2021   PPD Test 03/26/2016, 04/09/2016, 03/22/2017, 04/04/2018, 04/03/2019, 03/25/2020   Pneumococcal Conjugate-13 04/05/2016   Pneumococcal Polysaccharide-23 10/09/2011, 04/13/2016, 03/06/2017   Td 06/28/2009   Tdap 12/08/2020   Zoster Recombinat (Shingrix) 12/31/2018, 05/14/2019   Zoster, Live 03/12/2016    Conditions to be addressed/monitored:  Hypertension, Hyperlipidemia, Diabetes, and Chronic Kidney Disease  Care Plan : [place]  Updates made by Charlton Haws, [place] since 10/11/2022 12:00 AM     Problem: Hypertension, Hyperlipidemia, Diabetes, and Chronic Kidney Disease   Priority: High     Long-Range Goal: Disease mgmt   Start Date: 02/23/2021  Expected End Date: 10/12/2023  This Visit's Progress: On track  Recent Progress: On track  Priority: High  Note:   Current Barriers:  Unable to independently afford treatment regimen Unable to maintain control of DM  Pharmacist Clinical Goal(s):  Patient will verbalize ability to afford treatment regimen adhere to plan to optimize therapeutic regimen for DM as evidenced by report of adherence to recommended medication management changes through collaboration with PharmD and provider.   Interventions: 1:1 collaboration with Venia Carbon, MD regarding development and update of comprehensive plan of care as evidenced by  provider attestation and co-signature Inter-disciplinary care team  collaboration (see longitudinal plan of care) Comprehensive medication review performed; medication list updated in electronic medical record  Hypertension / Heart Failure(BP goal <140/90) -Controlled - per home readings; pt reports he is off losartan and dizziness has improved -Current home readings: 138/70 -Last ejection fraction: 50-55% (04/05/21); previously 35-40% (02/08/20) -HF type: HFimpEF (EF improved from <40% to > 40%) NYHA Class: II (slight limitation of activity) -Follows with cardiology (Dr Saralyn Pilar) -Current treatment: Carvedilol 6.25 mg BID - Appropriate, Effective, Safe, Accessible -Medications previously tried: hydralazine, amlodipine, metoprolol, losartan -Educated on BP goals and benefits of medications for prevention of heart attack, stroke and kidney damage; Proper BP monitoring technique -Reviewed hydration - pt has been told to drink 4-16oz bottles per day -Counseled to monitor BP at home daily -Recommend to continue current medication  Hyperlipidemia: (LDL goal < 70) -Query controlled - LDL 103 (08/2020 - [facility]) above goal; pt endorses compliance with statin -Hx aortic atherosclerosis; ASCVD risk 40% - high -Current treatment: Atorvastatin 20 mg daily HS - Appropriate, Query Effective -Medications previously tried: none -Educated on Cholesterol goals - more aggressive LDL goal < 70 chosen due to high ASCVD risk and aortic atherosclerosis on imaging -Recommended to continue current medication; Recommend updating lipid panel at next PCP visit  Diabetes (A1c goal <7%) -Not ideally controlled - A1c 8.1% (08/2022) -Pt reports he is no longer seeing endocrine, PCP will manage DM -Current home glucose readings - checks twice daily 9 AM and 5-6 PM (before eating) Fasting glucose: 138, 174 Before supper: 212, 210 -Denies hypoglycemic/hyperglycemic symptoms -24 hr diet recall: -Breakfast: eggs, grits,  meat -Lunch: burger, fries ([employer]) -Current medications: Trulicity 4.5 mg weekly (PAP) - Appropriate, Query Effective Glipizide 5 mg BID- Appropriate, Query Effective -Medications previously tried: insulin   -Educated on A1c and blood sugar goals; -Discussed which foods in his 24-hr diet recall were carbs; advised to increase vegetables, proteins in his diet -Recommend to continue current medication; renew Trulicity PAP for 9935  Hx Kidney Transplant / CKD Stage 3b (Goal: maintain kidney function) -All medications assessed for renal dosing and appropriateness in chronic kidney disease. -Follows with [facility] (s/p transplant 04/2020) -Current medications: Tacrolimus 1 mg - 4 tab BID - Appropriate, Effective, Safe, Accessible Mycophenolate 250 mg BID - Appropriate, Effective, Safe, Accessible -Recommend to continue current medication  Depression/Anxiety (Goal: manage symptoms) -Controlled - per pt report -PHQ9, GAD7 not on file -Connected with PCP for mental health support -Current treatment: Citalopram 20 mg daily - Appropriate, Effective, Safe, Accessible -Medications previously tried/failed: n/a -Educated on Benefits of medication for symptom control -Recommended to continue current medication  Patient Goals/Self-Care Activities Patient will:  - take medications as prescribed as evidenced by patient report and record review focus on medication adherence by routine check glucose twice daily, document, and provide at future appointments collaborate with provider on medication access solutions      Medication Assistance:  [place] cares approved 2023 (via [facility] endocrine)  Compliance/Adherence/Medication fill history: Care Gaps: Foot exam  - done at [facility] UACR, GFR - done at [facility] Drugs: Atorvastatin - PDC 100% Glipizide - PDC 701% Trulicity - PDC inaccurate; [employer] Losartan - PDC 47% (stopped July 2023?)  Medication Access: Within  the past 30 days, how often has patient missed a dose of medication? 0 Is a pillbox or other method used to improve adherence? Yes  Factors that may affect medication adherence? no barriers identified Are meds synced by current pharmacy? No  Are  meds delivered by current pharmacy? Yes  Does patient experience delays in picking up medications due to transportation concerns? No   [facility] Reviewed: Is patient disadvantaged to use [facility]?: Yes  Current Rx insurance plan: [employer] [place] Name and location of Current pharmacy:  [facility] [address], [place] - [place] [address] Phone: [phone] Fax: [phone]  [facility] [place], [place] [address] Phone: [phone] Fax: [phone]  [facility] services reviewed with patient today?: No  Patient requests to transfer care to [facility]?: No  Reason patient declined to change pharmacies: Disadvantaged due to insurance/mail order   Care Plan and Follow Up Patient Decision:  Patient agrees to Care Plan and Follow-up.  Plan: Telephone follow up appointment with care management team member scheduled for:  2 months  Charlene Brooke, PharmD, [facility] Clinical Pharmacist [facility] [phone]

## 2022-10-12 ENCOUNTER — Telehealth: Payer: Self-pay

## 2022-10-12 NOTE — Progress Notes (Addendum)
Patient is currently enrolled in [employer] patient assistance program for the medication Trulicity until date: 10/21/22 Patient would like to re-enroll in patient assistance for the next calendar year.  Renewal forms were completed on behalf of the patient. Patient has requested to sign forms in the office. Forms will be printed and placed at front desk.  Lindsey Foltanski, CPP notified  Megan Hayduk, RMA Clinical Pharmacy Assistant [phone]   

## 2022-10-12 NOTE — Telephone Encounter (Signed)
Forms printed and placed in front office for pt signature. 

## 2022-10-17 NOTE — Telephone Encounter (Signed)
Patient has been made aware.  Charlene Brooke, CPP notified  Marijean Niemann, [place] Clinical Pharmacy Assistant [phone]

## 2022-10-24 NOTE — Telephone Encounter (Signed)
Patient came by and signed paperwork. Put in your box.

## 2022-10-29 NOTE — Telephone Encounter (Signed)
Faxed completed Trulicity application to [employer]. Will await response from manufacturer.

## 2022-10-30 NOTE — Telephone Encounter (Signed)
Patient has been approved for Trulicity assistance via [employer] through 10/22/23.

## 2022-10-31 ENCOUNTER — Encounter: Payer: Self-pay | Admitting: Internal Medicine

## 2022-10-31 ENCOUNTER — Ambulatory Visit (INDEPENDENT_AMBULATORY_CARE_PROVIDER_SITE_OTHER): Payer: Medicare HMO | Admitting: Internal Medicine

## 2022-10-31 VITALS — BP 132/84 | HR 76 | Temp 97.9°F | Ht 72.25 in | Wt 232.0 lb

## 2022-10-31 DIAGNOSIS — I700 Atherosclerosis of aorta: Secondary | ICD-10-CM

## 2022-10-31 DIAGNOSIS — S069X0S Unspecified intracranial injury without loss of consciousness, sequela: Secondary | ICD-10-CM

## 2022-10-31 DIAGNOSIS — Z0000 Encounter for general adult medical examination without abnormal findings: Secondary | ICD-10-CM | POA: Diagnosis not present

## 2022-10-31 DIAGNOSIS — R419 Unspecified symptoms and signs involving cognitive functions and awareness: Secondary | ICD-10-CM | POA: Diagnosis not present

## 2022-10-31 DIAGNOSIS — E1121 Type 2 diabetes mellitus with diabetic nephropathy: Secondary | ICD-10-CM | POA: Diagnosis not present

## 2022-10-31 DIAGNOSIS — N1832 Chronic kidney disease, stage 3b: Secondary | ICD-10-CM | POA: Insufficient documentation

## 2022-10-31 DIAGNOSIS — I5032 Chronic diastolic (congestive) heart failure: Secondary | ICD-10-CM | POA: Diagnosis not present

## 2022-10-31 DIAGNOSIS — F39 Unspecified mood [affective] disorder: Secondary | ICD-10-CM

## 2022-10-31 LAB — HM DIABETES FOOT EXAM

## 2022-10-31 NOTE — Progress Notes (Signed)
Hearing Screening - Comments:: Passed whisper test Vision Screening - Comments:: June 2023?  

## 2022-10-31 NOTE — Assessment & Plan Note (Signed)
I have personally reviewed the Medicare Annual Wellness questionnaire and have noted 1. The patient's medical and social history 2. Their use of alcohol, tobacco or illicit drugs 3. Their current medications and supplements 4. The patient's functional ability including ADL's, fall risks, home safety risks and hearing or visual             impairment. 5. Diet and physical activities 6. Evidence for depression or mood disorders  The patients weight, height, BMI and visual acuity have been recorded in the chart I have made referrals, counseling and provided education to the patient based review of the above and I have provided the pt with a written personalized care plan for preventive services.  I have provided you with a copy of your personalized plan for preventive services. Please take the time to review along with your updated medication list.  Recent colonoscopy--repeat 7 years Recent PSA normal Urged him to exercise Had updated COVID and flu vaccines

## 2022-10-31 NOTE — Telephone Encounter (Cosign Needed)
Patient has been informed.  Charlene Brooke, PharmD notified  Marijean Niemann, [place] Clinical Pharmacy Assistant [phone]

## 2022-10-31 NOTE — Assessment & Plan Note (Signed)
On imaging
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
## 2022-10-31 NOTE — Assessment & Plan Note (Addendum)
Dysthymia No MDD Will continue the citalopram 20

## 2022-10-31 NOTE — Assessment & Plan Note (Signed)
Lab Results  Component Value Date   HGBA1C 8.0 (A) 04/27/2022   Clearly related to lifestyle issues  Asked him to stop or cut down carbs/snacks On trulicity 4.'5mg'$  weekly, glipizide 5 bid
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
## 2022-10-31 NOTE — Assessment & Plan Note (Addendum)
Compensated now Mostly related to his renal failure in the past 'carvedilol 3.125 mg bid

## 2022-10-31 NOTE — Assessment & Plan Note (Signed)
Monitored by transplant team

## 2022-11-02 ENCOUNTER — Encounter: Payer: Medicare HMO | Admitting: Internal Medicine

## 2022-12-10 ENCOUNTER — Telehealth: Payer: Self-pay

## 2022-12-10 NOTE — Progress Notes (Signed)
Care Management & Coordination Services Pharmacy Team  Reason for Encounter: Appointment Reminder  Contacted patient to confirm telephone appointment with Charlene Brooke , PharmD on 12/13/22 at 8:45. Spoke with patient on 12/10/2022   Do you have any problems getting your medications? No   What is your top health concern you would like to discuss at your upcoming visit? Some concern over high BG's in the afternoon  Have you seen any other providers since your last visit with PCP? No  Hospital visits:  None in previous 6 months   Star Rating Drugs:  Medication:  Last Fill: Day Supply Atorvastatin [id number] Trulicity   manufacturer Glipizide 28m  11/30/22  90    Care Gaps: Annual wellness visit in last year? Yes  If Diabetic: Last eye exam / retinopathy screening:UTD Last diabetic foot exam:UTD   LCharlene Brooke PharmD notified  [place]Assistant [phone]

## 2022-12-11 ENCOUNTER — Telehealth: Payer: Self-pay

## 2022-12-11 NOTE — Patient Outreach (Addendum)
  Care Coordination   Initial Visit Note   12/11/2022 Name: Richard Chen MRN: [id number] DOB: 1956-04-18  Richard Chen is a 67 y.o. year old male who sees Venia Carbon, MD for primary care. I  spoke with Tana Felts, wife/ dpr  What matters to the patients health and wellness today?  Wife states patient does not have any nursing or community resource needs.    Goals Addressed             This Visit's Progress    COMPLETED: care coordination - no follow up needed       Interventions Today    Flowsheet Row Most Recent Value  Chronic Disease   Chronic disease during today's visit Congestive Heart Failure (CHF)  General Interventions   General Interventions Discussed/Reviewed General Interventions Reviewed  [Care coordination program/ servies discussed. SDOH survey completed, AWV discussed and patient advised to schedule as needed.  Patient advised to contact provider office if care coordination services needed in the future.]              SDOH assessments and interventions completed:  Yes  SDOH Interventions Today    Flowsheet Row Most Recent Value  SDOH Interventions   Food Insecurity Interventions Intervention Not Indicated  Housing Interventions Intervention Not Indicated  Transportation Interventions Intervention Not Indicated        Care Coordination Interventions:  No, not indicated   Follow up plan: No further follow up needed  Encounter Outcome:  Pt. Visit Completed   Quinn Plowman RN,BSN,CCM [place] [phone] direct line

## 2022-12-12 DIAGNOSIS — I161 Hypertensive emergency: Secondary | ICD-10-CM | POA: Diagnosis not present

## 2022-12-12 DIAGNOSIS — I5032 Chronic diastolic (congestive) heart failure: Secondary | ICD-10-CM | POA: Diagnosis not present

## 2022-12-12 DIAGNOSIS — R001 Bradycardia, unspecified: Secondary | ICD-10-CM | POA: Diagnosis not present

## 2022-12-12 DIAGNOSIS — I10 Essential (primary) hypertension: Secondary | ICD-10-CM | POA: Diagnosis not present

## 2022-12-12 DIAGNOSIS — R0602 Shortness of breath: Secondary | ICD-10-CM | POA: Diagnosis not present

## 2022-12-12 DIAGNOSIS — R600 Localized edema: Secondary | ICD-10-CM | POA: Diagnosis not present

## 2022-12-12 DIAGNOSIS — E7800 Pure hypercholesterolemia, unspecified: Secondary | ICD-10-CM | POA: Diagnosis not present

## 2022-12-13 ENCOUNTER — Ambulatory Visit: Payer: Medicare HMO | Admitting: Pharmacist

## 2022-12-13 NOTE — Patient Instructions (Signed)
Visit Information  Phone number for Pharmacist: [phone]  Thank you for meeting with me to discuss your medications! Below is a summary of what we talked about during the visit:   Recommendations/Changes made from today's visit: -No med changes -Advised to check BG 2-hr after meal occasionally -Advised to increase protein/vegetable in diet, reduce carbs -Recommend lipid panel at CPE  Follow up plan: -[place] will call patient 1 month for DM update -Pharmacist follow up televisit scheduled for 4 months -PCP appt 03/06/23   Charlene Brooke, PharmD, BCACP Clinical Pharmacist [facility] [phone]

## 2022-12-13 NOTE — Progress Notes (Signed)
Care Management & Coordination Services Pharmacy Note  12/13/2022 Name:  KARLE MASSE MRN:  [id number] DOB:  1955-12-07  Summary: -DM: A1c 8.1% (08/2022 - [facility] records), home readings range 108-200 before breakfast and dinner; reviewed optimal timing of BG checks -HLD: Last LDL 103 from 08/2020 (per [facility] records), above goal of < 70 (given high ASCVD risk (31%) and aortic atherosclerosis on imaging); pt is due for repeat lipid panel   Recommendations/Changes made from today's visit: -No med changes -Advised to check BG 2-hr after meal occasionally -Advised to increase protein/vegetable in diet, reduce carbs -Recommend lipid panel at CPE  Follow up plan: -[place] will call patient 1 month for DM update -Pharmacist follow up televisit scheduled for 4 months -PCP appt 03/06/23    Subjective: Richard Chen is an 67 y.o. year old male who is a primary patient of Venia Carbon, MD.  The care coordination team was consulted for assistance with disease management and care coordination needs.    Engaged with patient by telephone for follow up visit.  Recent office visits: 10/31/22 Dr Silvio Pate OV: annual - d/c omeprazole, tamsulosin (pt preference)  04/27/22 Dr Silvio Pate OV: f/u - A1c 8.0%; d/c losartan (dizziness). Change glipizide 10 to short acting glipizide 5 mg.   Recent consult visits: 12/12/22 Dr Saralyn Pilar (Cardiology): f/u HF, bradycardia. No changes.  06/15/22 Dr Lysle Rubens (Transplant): advised d/w urologist - flomax may be contributing to orthostasis. Reduce stool softener to once a day   01/23/22 Dr Ronny Flurry (Endocrine): f/u DM. A1c unreliable due to anemia. Will switch Trulicity to Ozempic 1 mg.   Hospital visits: None in previous 6 months   Objective:  Lab Results  Component Value Date   CREATININE 6.66 (H) 05/09/2020   BUN 37 (H) 05/09/2020   GFR 17.94 (L) 03/05/2016   GFRNONAA 8 (L) 05/09/2020   GFRAA 9 (L) 05/09/2020   NA 136 05/09/2020   K 3.7 05/09/2020    CALCIUM 8.1 (L) 05/09/2020   CO2 30 05/09/2020   GLUCOSE 151 (H) 05/09/2020    Lab Results  Component Value Date/Time   HGBA1C 8.0 (A) 04/27/2022 09:05 AM   HGBA1C 7.0 (A) 04/20/2021 02:47 PM   HGBA1C 6.2 (H) 02/07/2020 07:23 AM   HGBA1C 6.6 (H) 10/13/2019 10:00 AM   GFR 17.94 (L) 03/05/2016 12:39 PM   GFR 53.30 (L) 02/18/2015 12:25 PM   MICROALBUR 37.6 (H) 10/09/2011 10:18 AM   MICROALBUR 8.1 (H) 06/28/2009 09:24 AM    Last diabetic Eye exam:  Lab Results  Component Value Date/Time   HMDIABEYEEXA No Retinopathy 04/11/2022 12:00 AM    Last diabetic Foot exam:  Lab Results  Component Value Date/Time   HMDIABFOOTEX done 10/31/2022 12:00 AM     Lab Results  Component Value Date   CHOL 133 10/13/2019   HDL 42.90 10/13/2019   LDLCALC 65 10/13/2019   LDLDIRECT 166.5 10/26/2013   TRIG 125.0 10/13/2019   CHOLHDL 3 10/13/2019       Latest Ref Rng & Units 05/09/2020   10:16 AM 02/07/2020    7:23 AM 10/13/2019   10:00 AM  Hepatic Function  Total Protein 6.5 - 8.1 g/dL 7.9  7.6  6.8   Albumin 3.5 - 5.0 g/dL 4.1  3.7  3.7   AST 15 - 41 U/L 15  30  13   $ ALT 0 - 44 U/L 14  30  14   $ Alk Phosphatase 38 - 126 U/L 74  76  72  Total Bilirubin 0.3 - 1.2 mg/dL 0.7  0.8  0.3   Bilirubin, Direct 0.0 - 0.3 mg/dL   0.1     Lab Results  Component Value Date/Time   TSH 1.79 03/26/2018 09:37 AM   TSH 1.10 07/14/2014 11:43 AM   FREET4 1.02 10/13/2019 10:00 AM   FREET4 1.04 03/26/2018 09:37 AM       Latest Ref Rng & Units 05/09/2020   10:16 AM 02/07/2020    7:23 AM 10/03/2019   10:55 AM  CBC  WBC 4.0 - 10.5 K/uL 7.7  6.6  6.8   Hemoglobin 13.0 - 17.0 g/dL 11.1  11.4  10.1   Hematocrit 39.0 - 52.0 % 33.7  37.0  32.2   Platelets 150 - 400 K/uL 146  161  152     Lab Results  Component Value Date/Time   VITAMINB12 1,086 (H) 07/14/2014 11:43 AM    Clinical ASCVD: Yes  The 10-year ASCVD risk score (Arnett DK, et al., 2019) is: 31.8%   Values used to calculate the score:      Age: 67 years     Sex: Male     Is Non-Hispanic African American: Yes     Diabetic: Yes     Tobacco smoker: No     Systolic Blood Pressure: [id number]     Is BP treated: Yes     HDL Cholesterol: 46 mg/dL     Total Cholesterol: 181 mg/dL        10/31/2022   11:53 AM  Depression screen PHQ 2/9  Decreased Interest 0  Down, Depressed, Hopeless 0  PHQ - 2 Score 0     Social History   Tobacco Use  Smoking Status Never   Passive exposure: Never  Smokeless Tobacco Never   BP Readings from Last 3 Encounters:  10/31/22 132/84  10/11/22 138/70  10/05/22 (!) 151/72   Pulse Readings from Last 3 Encounters:  10/31/22 76  10/05/22 65  08/31/22 68   Wt Readings from Last 3 Encounters:  10/31/22 232 lb (105.2 kg)  10/05/22 232 lb (105.2 kg)  08/31/22 231 lb (104.8 kg)   BMI Readings from Last 3 Encounters:  10/31/22 31.25 kg/m  10/05/22 30.61 kg/m  08/31/22 30.48 kg/m    Allergies  Allergen Reactions   Aspirin Anaphylaxis   Shellfish Allergy Anaphylaxis   Clonidine Other (See Comments)    Gait imbalance/ dysfunction requiring wheelchair.  Increased lethargy.    Other Hives    Dye when viewed kidney (break out in knots)   Contrast Media [Iodinated Contrast Media] Rash and Hives    Medications Reviewed Today     Reviewed by Charlton Haws, [facility] (Pharmacist) on 12/13/22 at [place] List Status: <None>   Medication Order Taking? Sig Documenting Provider Last Dose Status Informant  Accu-Chek Softclix Lancets lancets [id number] Yes Use to obtain blood sugar dx code E11.29 Venia Carbon, MD Taking Active   acetaminophen (TYLENOL) 500 MG tablet [id number] Yes Take 500 mg by mouth every 6 (six) hours as needed (pain).  [provider] Taking Active Spouse/Significant Other  Alcohol Swabs (ALCOHOL PREP) PADS [id number] Yes 1 Units by Does not apply route daily. Venia Carbon, MD Taking Active Spouse/Significant Other  allopurinol (ZYLOPRIM) 100 MG tablet  [id number] Yes Take 100 mg by mouth daily. [provider] Taking Active   atorvastatin (LIPITOR) 20 MG tablet [id number] Yes TAKE 1 TABLET EVERY DAY Venia Carbon, MD Taking Active   Blood  Glucose Monitoring Suppl (ACCU-CHEK GUIDE) w/Device KIT [id number] Yes USE AS DIRECTED Venia Carbon, MD Taking Active   carvedilol (COREG) 3.125 MG tablet [id number] Yes Take 3.125 mg by mouth 2 (two) times daily. [provider] Taking Active   Cholecalciferol (VITAMIN D3) 50 MCG (2000 UT) CAPS [id number] Yes Take by mouth. [provider] Taking Active   citalopram (CELEXA) 20 MG tablet [id number] Yes TAKE 1 TABLET EVERY DAY Venia Carbon, MD Taking Active   Dulaglutide 4.5 MG/0.5ML SOPN [id number] Yes Inject 4.5 mg into the skin once a week. Inject into skin weekly [provider] Taking Active            Med Note Rolan Bucco Dec 13, 2022  9:00 AM) Ralph Leyden Cares PAP  famotidine (PEPCID) 20 MG tablet [id number] Yes Take 20 mg by mouth daily. [provider] Taking Active   fexofenadine (ALLEGRA) 180 MG tablet [id number] Yes Take 180 mg daily by mouth. [provider] Taking Active Spouse/Significant Other  fluticasone (FLONASE) 50 MCG/ACT nasal spray [id number] Yes USE 2 SPRAYS IN EACH NOSTRIL EVERY DAY Venia Carbon, MD Taking Active   glipiZIDE (GLUCOTROL) 5 MG tablet [id number] Yes Take 1 tablet (5 mg total) by mouth 2 (two) times daily before a meal. Venia Carbon, MD Taking Active   glucose blood (ACCU-CHEK GUIDE) test strip [id number] Yes Use to check blood sugar once a day Dx Code E11.29 Venia Carbon, MD Taking Active   mycophenolate (CELLCEPT) 250 MG capsule [id number] Yes Take 250 mg by mouth. Take 1 in the morning and 1 at bedtime (08/18/21) [provider] Taking Active Self  predniSONE (DELTASONE) 5 MG tablet [id number] Yes 5 mg daily. [provider] Taking Active Self  senna-docusate (SENOKOT-S) 8.6-50  MG tablet [id number] Yes Take by mouth 2 (two) times daily as needed. [provider] Taking Active   sildenafil (VIAGRA) 100 MG tablet [id number] Yes Take 1 tablet (100 mg total) by mouth daily as needed for erectile dysfunction. Take two hours prior to intercourse on an empty stomach McGowan, Larene [place] A, PA-C Taking Active   tacrolimus (PROGRAF) 1 MG capsule [id number] Yes 4 tablets qAM and 4 tablets qPM changed 07/06/21 [provider] Taking Active Self            SDOH:  (Social Determinants of Health) assessments and interventions performed: No SDOH Interventions    Flowsheet Row Telephone from 12/11/2022 in [place] Management from 10/11/2022 in [place] at [place] Management from 09/12/2021 in [place] at [place] Management from 02/23/2021 in [place] at [place] Management from 08/31/2020 in [place] at [place] Interventions Intervention Not Indicated -- -- -- --  Housing Interventions Intervention Not Indicated Intervention Not Indicated -- -- --  Transportation Interventions Intervention Not Indicated Intervention Not Indicated -- -- --  Financial Strain Interventions -- Other (Comment)  [Trulicity PAP] Other (Comment)  [Renew assistance for Trulicity 3 mg for 0000000 Other (Comment)  [Apply for Trulicity assistance] Intervention Not Indicated       Medication Assistance:  [place] approved through 10/22/23  Medication Access: Within the past 30 days, how often has patient missed a dose of medication? 0 Is a pillbox or other method used to improve adherence? Yes  Factors that may affect medication adherence? no barriers identified Are meds synced by current pharmacy? No  Are meds delivered by current pharmacy?  Yes  Does patient experience delays in picking up medications due to transportation concerns? No   [facility] Services Reviewed: Is patient disadvantaged to use [facility]?: Yes  Current Rx insurance plan: [employer] Name and location of Current pharmacy:  [facility] [address] [place] [address] Phone: [phone] Fax: [phone]  [facility] [place], [place] [address] Phone: [phone] Fax: [phone]  [facility] services reviewed with patient today?: No  Patient requests to transfer care to [facility] Pharmacy?: No  Reason patient declined to change pharmacies: Disadvantaged due to insurance/mail order  Compliance/Adherence/Medication fill history: Care Gaps: UACR - done 05/2022 @ [facility] A1C (due 10/2022) - done 08/2022 @ [facility]  Star-Rating Drugs: Atorvastatin - PDC 123XX123 Trulicity - PAP Glipizide - PDC 100%   ASSESSMENT / PLAN  Hypertension / Heart Failure(BP goal <140/90) -Controlled - per home readings; pt reports he is off losartan and dizziness has improved -Current home readings: 138/70 -Last ejection fraction: 50-55% (04/05/21); previously 35-40% (02/08/20) -HF type: HFimpEF (EF improved from <40% to > 40%); NYHA Class: II (slight limitation of activity) -Follows with cardiology (Dr Saralyn Pilar) -Current treatment: Carvedilol 6.25 mg BID - Appropriate, Effective, Safe, Accessible -Medications previously tried: hydralazine, amlodipine, metoprolol, losartan -Educated on BP goals and benefits of medications for prevention of heart attack, stroke and kidney damage; Proper BP monitoring technique -Reviewed hydration - pt has been told to drink 4-16oz bottles per day -Counseled to monitor BP at home daily -Recommend to continue current medication  Hyperlipidemia: (LDL goal < 70) -Query controlled - LDL 103 (08/2020 - [facility]) above goal; pt  endorses compliance with statin -Hx aortic atherosclerosis; ASCVD risk 31% - high -Current treatment: Atorvastatin 20 mg daily HS - Appropriate, Query Effective -Medications previously tried: none -Educated on Cholesterol goals - more aggressive LDL goal < 70 chosen due to high ASCVD risk and aortic atherosclerosis on imaging -Recommended to continue current medication; Recommend updating lipid panel at next PCP visit  Diabetes (A1c goal <7%) -Not ideally controlled - A1c 8.1% (08/2022) -Pt reports he is no longer seeing endocrine, PCP will manage DM -Current home glucose readings - checks twice daily 9 AM and 5-6 PM (before eating) Fasting glucose: 112,122, 132, 108 Before supper: 173, 200 -Denies hypoglycemic/hyperglycemic symptoms -24 hr diet recall: -Breakfast: eggs, grits, meat -Lunch: burger, fries ([employer]) -Current medications: Trulicity 4.5 mg weekly (PAP) - Appropriate, Query Effective Glipizide 5 mg BID- Appropriate, Query Effective -Medications previously tried: insulin, Ozempic (cost) -Educated on A1c and blood sugar goals; reviewed optimal timing of BG monitoring (fasting and 2-hr post-prandial) -Discussed which foods in his 24-hr diet recall were carbs; advised to increase vegetables, proteins in his diet -Recommend to continue current medication  Hx Kidney Transplant / CKD Stage 3b (Goal: maintain kidney function) -All medications assessed for renal dosing and appropriateness in chronic kidney disease. -Follows with [facility] (s/p transplant 04/2020) -UACR 43 (08/2022 - [facility]) -Current medications: Tacrolimus 1 mg - 4 tab BID - Appropriate, Effective, Safe, Accessible Mycophenolate 250 mg BID - Appropriate, Effective, Safe, Accessible -Recommend to continue current medication  Depression/Anxiety (Goal: manage symptoms) -Controlled - per pt report -PHQ9, GAD7 not on file -Connected with PCP for mental health support -Current treatment: Citalopram 20 mg  daily -  Appropriate, Effective, Safe, Accessible -Medications previously tried/failed: n/a -Educated on Benefits of medication for symptom control -Recommended to continue current medication    Charlene Brooke, PharmD, BCACP Clinical Pharmacist [facility] at [facility] [phone]

## 2022-12-20 DIAGNOSIS — Z6831 Body mass index (BMI) 31.0-31.9, adult: Secondary | ICD-10-CM | POA: Diagnosis not present

## 2022-12-20 DIAGNOSIS — E119 Type 2 diabetes mellitus without complications: Secondary | ICD-10-CM | POA: Diagnosis not present

## 2022-12-20 DIAGNOSIS — D849 Immunodeficiency, unspecified: Secondary | ICD-10-CM | POA: Diagnosis not present

## 2022-12-20 DIAGNOSIS — I10 Essential (primary) hypertension: Secondary | ICD-10-CM | POA: Diagnosis not present

## 2022-12-20 DIAGNOSIS — Z5181 Encounter for therapeutic drug level monitoring: Secondary | ICD-10-CM | POA: Diagnosis not present

## 2022-12-20 DIAGNOSIS — Z794 Long term (current) use of insulin: Secondary | ICD-10-CM | POA: Diagnosis not present

## 2022-12-20 DIAGNOSIS — Z940 Kidney transplant status: Secondary | ICD-10-CM | POA: Diagnosis not present

## 2023-01-08 ENCOUNTER — Telehealth: Payer: Self-pay

## 2023-01-08 NOTE — Progress Notes (Signed)
Care Management & Coordination Services Pharmacy Team  Reason for Encounter: Diabetes  Contacted patient to discuss diabetes disease state. Spoke with patient on 01/09/2023   Current antihyperglycemic regimen:  Trulicity 4.5 mg weekly (PAP)  Glipizide 5 mg BID Jardiance 10mg   daily    Patient verbally confirms he is taking the above medications as directed. Yes  What diet changes have been made to improve diabetes control? Patient eating more green vetetables,beans,cutting down on sweets  What recent interventions/DTPs have been made to improve glycemic control:   MD at [facility] started  Jardiance   Have there been any recent hospitalizations or ED visits since last visit with PharmD? No  Patient denies hypoglycemic symptoms, including Pale, Sweaty, Shaky, Hungry, Nervous/irritable, and Vision changes  Patient denies hyperglycemic symptoms, including blurry vision, excessive thirst, fatigue, polyuria, and weakness  How often are you checking your blood sugar? twice daily  What are your blood sugars ranging?  Fasting:  12/31/22-101  pm after eating -181 01/01/23  am-125  pm-276  01/02/23  am-108  pm-143 01/03/23  am- 100  pm-136   01/04/23  am-115  pm-163 01/05/23  am-124  pm-136    01/06/23  am-128  pm-196 01/07/23  am-101  pm-244   01/08/23  am-121  pm-175 01/09/23  am -116   During the week, how often does your blood glucose drop below 70? Never- lowest this past month-98  Are you checking your feet daily/regularly? Yes  Adherence Review: Is the patient currently on a STATIN medication? Yes Is the patient currently on ACE/ARB medication? No Does the patient have >5 day gap between last estimated fill dates? No   Chart Updates:  Recent office visits:  None since last contact  Recent consult visits:  12/20/22-Matthew Ellis,MD([facility] transplant)-Labs,Start SGLT2 inhibitor,f/u 6 months   Hospital visits:  None in previous 6 months  Medications: Outpatient Encounter Medications  as of 01/08/2023  Medication Sig Note   Accu-Chek Softclix Lancets lancets Use to obtain blood sugar dx code E11.29    acetaminophen (TYLENOL) 500 MG tablet Take 500 mg by mouth every 6 (six) hours as needed (pain).     Alcohol Swabs (ALCOHOL PREP) PADS 1 Units by Does not apply route daily.    allopurinol (ZYLOPRIM) 100 MG tablet Take 100 mg by mouth daily.    atorvastatin (LIPITOR) 20 MG tablet TAKE 1 TABLET EVERY DAY    Blood Glucose Monitoring Suppl (ACCU-CHEK GUIDE) w/Device KIT USE AS DIRECTED    carvedilol (COREG) 3.125 MG tablet Take 3.125 mg by mouth 2 (two) times daily.    Cholecalciferol (VITAMIN D3) 50 MCG (2000 UT) CAPS Take by mouth.    citalopram (CELEXA) 20 MG tablet TAKE 1 TABLET EVERY DAY    Dulaglutide 4.5 MG/0.5ML SOPN Inject 4.5 mg into the skin once a week. Inject into skin weekly 12/13/2022: [facility] Cares PAP   famotidine (PEPCID) 20 MG tablet Take 20 mg by mouth daily.    fexofenadine (ALLEGRA) 180 MG tablet Take 180 mg daily by mouth.    fluticasone (FLONASE) 50 MCG/ACT nasal spray USE 2 SPRAYS IN EACH NOSTRIL EVERY DAY    glipiZIDE (GLUCOTROL) 5 MG tablet Take 1 tablet (5 mg total) by mouth 2 (two) times daily before a meal.    glucose blood (ACCU-CHEK GUIDE) test strip Use to check blood sugar once a day Dx Code E11.29    mycophenolate (CELLCEPT) 250 MG capsule Take 250 mg by mouth. Take 1 in the morning and 1 at bedtime (  08/18/21)    predniSONE (DELTASONE) 5 MG tablet 5 mg daily.    senna-docusate (SENOKOT-S) 8.6-50 MG tablet Take by mouth 2 (two) times daily as needed.    sildenafil (VIAGRA) 100 MG tablet Take 1 tablet (100 mg total) by mouth daily as needed for erectile dysfunction. Take two hours prior to intercourse on an empty stomach    tacrolimus (PROGRAF) 1 MG capsule 4 tablets qAM and 4 tablets qPM changed 07/06/21    No facility-administered encounter medications on file as of 01/08/2023.    Recent Relevant Labs: Lab Results  Component Value Date/Time    HGBA1C 8.0 (A) 04/27/2022 09:05 AM   HGBA1C 7.0 (A) 04/20/2021 02:47 PM   HGBA1C 6.2 (H) 02/07/2020 07:23 AM   HGBA1C 6.6 (H) 10/13/2019 10:00 AM   MICROALBUR 37.6 (H) 10/09/2011 10:18 AM   MICROALBUR 8.1 (H) 06/28/2009 09:24 AM    Kidney Function Lab Results  Component Value Date/Time   CREATININE 6.66 (H) 05/09/2020 10:16 AM   CREATININE 10.07 (H) 02/12/2020 03:36 AM   GFR 17.94 (L) 03/05/2016 12:39 PM   GFRNONAA 8 (L) 05/09/2020 10:16 AM   GFRAA 9 (L) 05/09/2020 10:16 AM    Star Rating Drugs:  Medication:  Last Fill: Day Supply Atorvastatin 20mg  [id number] Trulicity   manufacturer Glipizide 5mg   11/30/22  90 Jardiance 10mg  12/24/22  90   Care Gaps: Annual wellness visit in last year? Yes Last eye exam / retinopathy screening:UTD Last diabetic foot exam:UTD   Charlene Brooke, PharmD notified  Avel Sensor, [place] Assistant [phone]

## 2023-01-11 NOTE — Addendum Note (Signed)
Addended by: Charlton Haws on: 01/11/2023 03:49 PM   Modules accepted: Orders

## 2023-01-11 NOTE — Telephone Encounter (Signed)
A1c was 7.7% 12/20/22 at [facility] appt. Jardiance 10 mg was added. Updated med list.

## 2023-02-22 ENCOUNTER — Other Ambulatory Visit: Payer: Self-pay | Admitting: Internal Medicine

## 2023-02-22 DIAGNOSIS — Z940 Kidney transplant status: Secondary | ICD-10-CM | POA: Diagnosis not present

## 2023-02-22 DIAGNOSIS — Z5181 Encounter for therapeutic drug level monitoring: Secondary | ICD-10-CM | POA: Diagnosis not present

## 2023-03-06 ENCOUNTER — Encounter: Payer: Self-pay | Admitting: Internal Medicine

## 2023-03-06 ENCOUNTER — Ambulatory Visit (INDEPENDENT_AMBULATORY_CARE_PROVIDER_SITE_OTHER): Payer: Medicare HMO | Admitting: Internal Medicine

## 2023-03-06 VITALS — BP 142/77 | HR 66 | Temp 97.4°F | Ht 72.25 in | Wt 235.0 lb

## 2023-03-06 DIAGNOSIS — E1121 Type 2 diabetes mellitus with diabetic nephropathy: Secondary | ICD-10-CM | POA: Diagnosis not present

## 2023-03-06 DIAGNOSIS — Z7985 Long-term (current) use of injectable non-insulin antidiabetic drugs: Secondary | ICD-10-CM

## 2023-03-06 DIAGNOSIS — I10 Essential (primary) hypertension: Secondary | ICD-10-CM | POA: Diagnosis not present

## 2023-03-06 DIAGNOSIS — F39 Unspecified mood [affective] disorder: Secondary | ICD-10-CM | POA: Diagnosis not present

## 2023-03-06 DIAGNOSIS — Z7984 Long term (current) use of oral hypoglycemic drugs: Secondary | ICD-10-CM | POA: Diagnosis not present

## 2023-03-06 LAB — POCT GLYCOSYLATED HEMOGLOBIN (HGB A1C): Hemoglobin A1C: 7.3 % — AB (ref 4.0–5.6)
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
## 2023-03-06 NOTE — Assessment & Plan Note (Signed)
BP Readings from Last 3 Encounters:  03/06/23 (!) 150/80  10/31/22 132/84  10/11/22 138/70   Up some today--but generally okay He monitors at home--and usually 140s/70's (and his team is okay with that) Carvediol 3.125 bid,

## 2023-03-06 NOTE — Assessment & Plan Note (Addendum)
Last 7.7%--but since then started jardiance 10 daily by transplant team On trulicity 4.5mg  weekly and glipizide 5 bid Will recheck his control  Lab Results  Component Value Date   HGBA1C 7.3 (A) 03/06/2023   Much better Continue current Rx

## 2023-03-06 NOTE — Assessment & Plan Note (Signed)
Still with dysthymia--but seems better Continues on the citalopram 20

## 2023-04-04 ENCOUNTER — Telehealth: Payer: Self-pay | Admitting: Pharmacist

## 2023-04-04 NOTE — Progress Notes (Signed)
Contacted patient to cancel upcoming appointment with [facility] Pharmacist. He denies any medication needs at this time. Will outreach later in the year for 2025 patient assistance re-enrollment.   Catie Eppie Gibson, PharmD, BCACP, CPP [facility] [phone]

## 2023-04-09 ENCOUNTER — Encounter: Payer: Medicare HMO | Admitting: Pharmacist

## 2023-04-12 ENCOUNTER — Encounter: Payer: Medicare HMO | Admitting: Pharmacist

## 2023-04-17 ENCOUNTER — Other Ambulatory Visit: Payer: Self-pay | Admitting: Ophthalmology

## 2023-04-17 DIAGNOSIS — E119 Type 2 diabetes mellitus without complications: Secondary | ICD-10-CM | POA: Diagnosis not present

## 2023-04-17 DIAGNOSIS — Z0100 Encounter for examination of eyes and vision without abnormal findings: Secondary | ICD-10-CM | POA: Diagnosis not present

## 2023-04-17 DIAGNOSIS — G43109 Migraine with aura, not intractable, without status migrainosus: Secondary | ICD-10-CM | POA: Diagnosis not present

## 2023-04-17 DIAGNOSIS — H25013 Cortical age-related cataract, bilateral: Secondary | ICD-10-CM | POA: Diagnosis not present

## 2023-04-17 LAB — HM DIABETES EYE EXAM

## 2023-04-29 ENCOUNTER — Ambulatory Visit
Admission: RE | Admit: 2023-04-29 | Discharge: 2023-04-29 | Disposition: A | Discharge status: Home / Self Care | Payer: Medicare HMO | Source: Ambulatory Visit | Attending: Ophthalmology | Admitting: Ophthalmology

## 2023-04-29 DIAGNOSIS — G43109 Migraine with aura, not intractable, without status migrainosus: Secondary | ICD-10-CM | POA: Insufficient documentation

## 2023-04-29 DIAGNOSIS — I6782 Cerebral ischemia: Secondary | ICD-10-CM | POA: Diagnosis not present

## 2023-04-29 DIAGNOSIS — R9089 Other abnormal findings on diagnostic imaging of central nervous system: Secondary | ICD-10-CM | POA: Diagnosis not present

## 2023-04-29 DIAGNOSIS — G9389 Other specified disorders of brain: Secondary | ICD-10-CM | POA: Diagnosis not present

## 2023-05-06 DIAGNOSIS — H2512 Age-related nuclear cataract, left eye: Secondary | ICD-10-CM | POA: Diagnosis not present

## 2023-05-08 ENCOUNTER — Encounter: Payer: Self-pay | Admitting: Ophthalmology

## 2023-05-09 ENCOUNTER — Encounter: Payer: Self-pay | Admitting: Ophthalmology

## 2023-05-09 NOTE — Anesthesia Preprocedure Evaluation (Addendum)
Anesthesia Evaluation  Patient identified by MRN, date of birth, ID band Patient awake    Reviewed: Allergy & Precautions, NPO status , Patient's Chart, lab work & pertinent test results  History of Anesthesia Complications Negative for: history of anesthetic complications  Airway Mallampati: IV   Neck ROM: Full    Dental  (+) Edentulous Upper, Edentulous Lower   Pulmonary former smoker   Pulmonary exam normal breath sounds clear to auscultation       Cardiovascular hypertension, +CHF  Normal cardiovascular exam Rhythm:Regular Rate:Normal  Echo 04/05/21:  NORMAL LEFT VENTRICULAR SYSTOLIC FUNCTION WITH AN ESTIMATED EF = 50-55 %  NORMAL RIGHT VENTRICULAR SYSTOLIC FUNCTION  TRIVIAL REGURGITATION NOTED  NO VALVULAR STENOSIS  MILD RV ENLARGEMENT  MILD BIATRIAL ENLARGEMENT  MILD LVH       Neuro/Psych  Headaches PSYCHIATRIC DISORDERS Anxiety Depression       GI/Hepatic negative GI ROS,,,  Endo/Other  diabetes    Renal/GU Renal disease (ESRD s/p kidney transplant 2021)     Musculoskeletal  (+) Arthritis ,  Gout    Abdominal   Peds  Hematology  (+) Blood dyscrasia, anemia   Anesthesia Other Findings Cardiology note 12/12/22:  67 year old gentleman initially referred for preoperative cardiovascular assessment prior to kidney transplant, without chest pain, with normal stress echocardiogram. The patient has essential hypertension, blood pressure mildly elevated today. After restarting losartan 2-3 weeks ago, he initially experienced dizziness, weakness, and shortness of breath, which has resolved in the last 6 days. The patient was hospitalized for fluid overload, with diagnosis of acute on chronic systolic/diastolic congestive heart failure, improved after emergent dialysis. 2D echocardiogram revealed mild to moderate reduced left ventricular function, which was reduced from previous echo in 2020. He is status post kidney  transplant 04/2020. The patient denies feeling fluid-overloaded. Repeat 2D echocardiogram 04/05/2021 revealed normal LV function with LVEF 50-55%.  Plan   1. Continue current medications 2. Counseled patient about low-sodium diet 3. DASH diet printed instructions given to the patient 4. Patient about low-cholesterol diet 5. Continue atorvastatin for hyper lipid management 6. Low-fat and cholesterol diet printed instructions given to the patient 7. Diabetes diet printed instructions given to the patient 8. Return to clinic for follow-up in 6 months  No orders of the defined types were placed in this encounter.  Return in about 6 months (around 06/12/2023).    Reproductive/Obstetrics                             Anesthesia Physical Anesthesia Plan  ASA: 3  Anesthesia Plan: MAC   Post-op Pain Management:    Induction: Intravenous  PONV Risk Score and Plan: 1 and Treatment may vary due to age or medical condition, Midazolam and TIVA  Airway Management Planned: Natural Airway and Nasal Cannula  Additional Equipment:   Intra-op Plan:   Post-operative Plan:   Informed Consent: I have reviewed the patients History and Physical, chart, labs and discussed the procedure including the risks, benefits and alternatives for the proposed anesthesia with the patient or authorized representative who has indicated his/her understanding and acceptance.     Dental advisory given  Plan Discussed with: CRNA  Anesthesia Plan Comments: (LMA/GETA backup discussed.  Patient consented for risks of anesthesia including but not limited to:  - adverse reactions to medications - damage to eyes, teeth, lips or other oral mucosa - nerve damage due to positioning  - sore throat or hoarseness - damage to  heart, brain, nerves, lungs, other parts of body or loss of life  Informed patient about role of CRNA in peri- and intra-operative care.  Patient voiced understanding.)         Anesthesia Quick Evaluation

## 2023-05-10 NOTE — Discharge Instructions (Signed)

## 2023-05-14 ENCOUNTER — Other Ambulatory Visit: Payer: Self-pay

## 2023-05-14 ENCOUNTER — Ambulatory Visit
Admission: RE | Admit: 2023-05-14 | Discharge: 2023-05-14 | Disposition: A | Discharge status: Home / Self Care | Payer: Medicare HMO | Attending: Ophthalmology | Admitting: Ophthalmology

## 2023-05-14 ENCOUNTER — Encounter: Payer: Self-pay | Admitting: Ophthalmology

## 2023-05-14 ENCOUNTER — Ambulatory Visit: Payer: Medicare HMO | Admitting: Anesthesiology

## 2023-05-14 ENCOUNTER — Encounter
Admission: RE | Disposition: A | Discharge status: Home / Self Care | Payer: Self-pay | Source: Home / Self Care | Attending: Ophthalmology

## 2023-05-14 DIAGNOSIS — E1136 Type 2 diabetes mellitus with diabetic cataract: Secondary | ICD-10-CM | POA: Diagnosis not present

## 2023-05-14 DIAGNOSIS — E1122 Type 2 diabetes mellitus with diabetic chronic kidney disease: Secondary | ICD-10-CM | POA: Diagnosis not present

## 2023-05-14 DIAGNOSIS — F419 Anxiety disorder, unspecified: Secondary | ICD-10-CM | POA: Insufficient documentation

## 2023-05-14 DIAGNOSIS — Z940 Kidney transplant status: Secondary | ICD-10-CM | POA: Insufficient documentation

## 2023-05-14 DIAGNOSIS — Z7985 Long-term (current) use of injectable non-insulin antidiabetic drugs: Secondary | ICD-10-CM | POA: Insufficient documentation

## 2023-05-14 DIAGNOSIS — M109 Gout, unspecified: Secondary | ICD-10-CM | POA: Diagnosis not present

## 2023-05-14 DIAGNOSIS — M1990 Unspecified osteoarthritis, unspecified site: Secondary | ICD-10-CM | POA: Diagnosis not present

## 2023-05-14 DIAGNOSIS — H2589 Other age-related cataract: Secondary | ICD-10-CM | POA: Diagnosis not present

## 2023-05-14 DIAGNOSIS — F32A Depression, unspecified: Secondary | ICD-10-CM | POA: Diagnosis not present

## 2023-05-14 DIAGNOSIS — N1832 Chronic kidney disease, stage 3b: Secondary | ICD-10-CM | POA: Diagnosis not present

## 2023-05-14 DIAGNOSIS — I130 Hypertensive heart and chronic kidney disease with heart failure and stage 1 through stage 4 chronic kidney disease, or unspecified chronic kidney disease: Secondary | ICD-10-CM | POA: Diagnosis not present

## 2023-05-14 DIAGNOSIS — I5042 Chronic combined systolic (congestive) and diastolic (congestive) heart failure: Secondary | ICD-10-CM | POA: Diagnosis not present

## 2023-05-14 DIAGNOSIS — I110 Hypertensive heart disease with heart failure: Secondary | ICD-10-CM | POA: Diagnosis not present

## 2023-05-14 DIAGNOSIS — Z7984 Long term (current) use of oral hypoglycemic drugs: Secondary | ICD-10-CM | POA: Diagnosis not present

## 2023-05-14 DIAGNOSIS — Z87891 Personal history of nicotine dependence: Secondary | ICD-10-CM | POA: Insufficient documentation

## 2023-05-14 DIAGNOSIS — I5032 Chronic diastolic (congestive) heart failure: Secondary | ICD-10-CM | POA: Diagnosis not present

## 2023-05-14 HISTORY — DX: Nonrheumatic mitral (valve) insufficiency: I34.0

## 2023-05-14 HISTORY — DX: Type 2 diabetes mellitus with diabetic nephropathy: E11.21

## 2023-05-14 HISTORY — DX: Abnormal findings on diagnostic imaging of heart and coronary circulation: R93.1

## 2023-05-14 HISTORY — DX: Presence of dental prosthetic device (complete) (partial): Z97.2

## 2023-05-14 HISTORY — PX: CATARACT EXTRACTION W/PHACO: SHX586

## 2023-05-14 LAB — GLUCOSE, CAPILLARY: Glucose-Capillary: 107 mg/dL — ABNORMAL HIGH (ref 70–99)

## 2023-05-14 SURGERY — PHACOEMULSIFICATION, CATARACT, WITH IOL INSERTION
Anesthesia: Monitor Anesthesia Care | Laterality: Left

## 2023-05-14 MED ORDER — SIGHTPATH DOSE#1 BSS IO SOLN
INTRAOCULAR | Status: DC | PRN
  Administered 2023-05-14: 15 mL via INTRAOCULAR

## 2023-05-14 MED ORDER — SIGHTPATH DOSE#1 NA CHONDROIT SULF-NA HYALURON 40-17 MG/ML IO SOLN
INTRAOCULAR | Status: DC | PRN
  Administered 2023-05-14: 1 mL via INTRAOCULAR

## 2023-05-14 MED ORDER — SIGHTPATH DOSE#1 BSS IO SOLN
INTRAOCULAR | Status: DC | PRN
  Administered 2023-05-14: 2 mL

## 2023-05-14 MED ORDER — SIGHTPATH DOSE#1 BSS IO SOLN
INTRAOCULAR | Status: DC | PRN
  Administered 2023-05-14: 76 mL via OPHTHALMIC

## 2023-05-14 MED ORDER — FENTANYL CITRATE (PF) 100 MCG/2ML IJ SOLN
INTRAMUSCULAR | Status: DC | PRN
  Administered 2023-05-14: 50 ug via INTRAVENOUS

## 2023-05-14 MED ORDER — CYCLOPENTOLATE HCL 2 % OP SOLN
1.0000 [drp] | OPHTHALMIC | Status: DC | PRN
  Administered 2023-05-14 (×3): 1 [drp] via OPHTHALMIC

## 2023-05-14 MED ORDER — MOXIFLOXACIN HCL 0.5 % OP SOLN
OPHTHALMIC | Status: DC | PRN
  Administered 2023-05-14: .2 mL via OPHTHALMIC

## 2023-05-14 MED ORDER — TRYPAN BLUE 0.06 % IO SOSY
PREFILLED_SYRINGE | INTRAOCULAR | Status: DC | PRN
  Administered 2023-05-14: .5 mL via INTRAOCULAR

## 2023-05-14 MED ORDER — BRIMONIDINE TARTRATE-TIMOLOL 0.2-0.5 % OP SOLN
OPHTHALMIC | Status: DC | PRN
  Administered 2023-05-14: 1 [drp] via OPHTHALMIC

## 2023-05-14 MED ORDER — MIDAZOLAM HCL 2 MG/2ML IJ SOLN
INTRAMUSCULAR | Status: DC | PRN
  Administered 2023-05-14: 2 mg via INTRAVENOUS

## 2023-05-14 MED ORDER — PHENYLEPHRINE HCL 10 % OP SOLN
1.0000 [drp] | OPHTHALMIC | Status: DC | PRN
  Administered 2023-05-14 (×3): 1 [drp] via OPHTHALMIC

## 2023-05-14 MED ORDER — TETRACAINE HCL 0.5 % OP SOLN
1.0000 [drp] | OPHTHALMIC | Status: DC | PRN
  Administered 2023-05-14 (×3): 1 [drp] via OPHTHALMIC

## 2023-05-14 SURGICAL SUPPLY — 12 items
ANGLE REVERSE CUT SHRT 25GA (CUTTER) ×1
CANNULA ANT/CHMB 27G (MISCELLANEOUS) IMPLANT
CANNULA ANT/CHMB 27GA (MISCELLANEOUS) ×1
CATARACT SUITE SIGHTPATH (MISCELLANEOUS) ×1
CYSTOTOME ANGL RVRS SHRT 25G (CUTTER) ×1 IMPLANT
FEE CATARACT SUITE SIGHTPATH (MISCELLANEOUS) ×1 IMPLANT
GLOVE BIOGEL PI IND STRL 8 (GLOVE) ×1 IMPLANT
GLOVE SURG ENC TEXT LTX SZ8 (GLOVE) ×1 IMPLANT
LENS IOL TECNIS EYHANCE 20.0 (Intraocular Lens) IMPLANT
NDL FILTER BLUNT 18X1 1/2 (NEEDLE) ×1 IMPLANT
NEEDLE FILTER BLUNT 18X1 1/2 (NEEDLE) ×1
SYR 3ML LL SCALE MARK (SYRINGE) ×1 IMPLANT

## 2023-05-14 NOTE — Transfer of Care (Signed)
Immediate Anesthesia Transfer of Care Note  Patient: Richard Chen  Procedure(s) Performed: CATARACT EXTRACTION PHACO AND INTRAOCULAR LENS PLACEMENT (IOC) LEFT  VISION BLUE DIABETIC 5.33 00:49.0 (Left)  Patient Location: PACU  Anesthesia Type: MAC  Level of Consciousness: awake, alert  and patient cooperative  Airway and Oxygen Therapy: Patient Spontanous Breathing and Patient connected to supplemental oxygen  Post-op Assessment: Post-op Vital signs reviewed, Patient's Cardiovascular Status Stable, Respiratory Function Stable, Patent Airway and No signs of Nausea or vomiting  Post-op Vital Signs: Reviewed and stable  Complications: No notable events documented.

## 2023-05-14 NOTE — Op Note (Signed)
PREOPERATIVE DIAGNOSIS:  Nuclear sclerotic cataract of the left eye.   POSTOPERATIVE DIAGNOSIS:  Nuclear sclerotic cataract of the left eye.   OPERATIVE PROCEDURE:ORPROCALL@   SURGEON:  Galen Manila, MD.   ANESTHESIA:  Anesthesiologist: Reed Breech, MD CRNA: Domenic Moras, CRNA  1.      Managed anesthesia care. 2.     0.45ml of Shugarcaine was instilled following the paracentesis   COMPLICATIONS:Vision Blue was used to stain the anterior capsule due to very poor/ no visualization of the red reflex.    TECHNIQUE:   Stop and chop   DESCRIPTION OF PROCEDURE:  The patient was examined and consented in the preoperative holding area where the aforementioned topical anesthesia was applied to the left eye and then brought back to the Operating Room where the left eye was prepped and draped in the usual sterile ophthalmic fashion and a lid speculum was placed. A paracentesis was created with the side port blade and the anterior chamber was filled with viscoelastic. A near clear corneal incision was performed with the steel keratome. A continuous curvilinear capsulorrhexis was performed with a cystotome followed by the capsulorrhexis forceps. Hydrodissection and hydrodelineation were carried out with BSS on a blunt cannula. The lens was removed in a stop and chop  technique and the remaining cortical material was removed with the irrigation-aspiration handpiece. The capsular bag was inflated with viscoelastic and the Technis [id number] lens was placed in the capsular bag without complication. The remaining viscoelastic was removed from the eye with the irrigation-aspiration handpiece. The wounds were hydrated. The anterior chamber was flushed with BSS and the eye was inflated to physiologic pressure. 0.54ml Vigamox was placed in the anterior chamber. The wounds were found to be water tight. The eye was dressed with Combigan. The patient was given protective glasses to wear throughout the day and a  shield with which to sleep tonight. The patient was also given drops with which to begin a drop regimen today and will follow-up with me in one day. Implant Name Type Inv. Item Serial No. Manufacturer Lot No. LRB No. Used Action  LENS IOL TECNIS EYHANCE 20.0 - [id number] Intraocular Lens LENS IOL TECNIS EYHANCE 20.0 [id number] [employer]  Left 1 Implanted    Procedure(s): CATARACT EXTRACTION PHACO AND INTRAOCULAR LENS PLACEMENT (IOC) LEFT  VISION BLUE DIABETIC 5.33 00:49.0 (Left)  Electronically signed: Galen Manila 05/14/2023 7:53 AM

## 2023-05-14 NOTE — Anesthesia Postprocedure Evaluation (Signed)
Anesthesia Post Note  Patient: Richard Chen  Procedure(s) Performed: CATARACT EXTRACTION PHACO AND INTRAOCULAR LENS PLACEMENT (IOC) LEFT  VISION BLUE DIABETIC 5.33 00:49.0 (Left)  Patient location during evaluation: PACU Anesthesia Type: MAC Level of consciousness: awake and alert, oriented and patient cooperative Pain management: pain level controlled Vital Signs Assessment: post-procedure vital signs reviewed and stable Respiratory status: spontaneous breathing, nonlabored ventilation and respiratory function stable Cardiovascular status: blood pressure returned to baseline and stable Postop Assessment: adequate PO intake Anesthetic complications: no   No notable events documented.   Last Vitals:  Vitals:   05/14/23 0759 05/14/23 0800  BP: (!) 143/79 (!) 143/79  Pulse: 63 64  Resp: 14 12  Temp:    SpO2: 99% 98%    Last Pain:  Vitals:   05/14/23 0800  TempSrc:   PainSc: 0-No pain                 Reed Breech
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
## 2023-05-15 ENCOUNTER — Encounter: Payer: Self-pay | Admitting: Ophthalmology

## 2023-05-20 DIAGNOSIS — H2511 Age-related nuclear cataract, right eye: Secondary | ICD-10-CM | POA: Diagnosis not present

## 2023-05-20 NOTE — Anesthesia Preprocedure Evaluation (Addendum)
Anesthesia Evaluation  Patient identified by MRN, date of birth, ID band Patient awake    Reviewed: Allergy & Precautions, NPO status , Patient's Chart, lab work & pertinent test results  History of Anesthesia Complications Negative for: history of anesthetic complications  Airway Mallampati: IV   Neck ROM: Full    Dental  (+) Edentulous Upper, Edentulous Lower Normally wears full upper and lower dentures:   Pulmonary former smoker   Pulmonary exam normal breath sounds clear to auscultation       Cardiovascular hypertension, +CHF  Normal cardiovascular exam Rhythm:Regular Rate:Normal  Echo 04/05/21:  NORMAL LEFT VENTRICULAR SYSTOLIC FUNCTION WITH AN ESTIMATED EF = 50-55 %  NORMAL RIGHT VENTRICULAR SYSTOLIC FUNCTION  TRIVIAL REGURGITATION NOTED  NO VALVULAR STENOSIS  MILD RV ENLARGEMENT  MILD BIATRIAL ENLARGEMENT  MILD LVH   Cardiology note 12/12/22:  67 year old gentleman initially referred for preoperative cardiovascular assessment prior to kidney transplant, without chest pain, with normal stress echocardiogram. The patient has essential hypertension, blood pressure mildly elevated today. After restarting losartan 2-3 weeks ago, he initially experienced dizziness, weakness, and shortness of breath, which has resolved in the last 6 days. The patient was hospitalized for fluid overload, with diagnosis of acute on chronic systolic/diastolic congestive heart failure, improved after emergent dialysis. 2D echocardiogram revealed mild to moderate reduced left ventricular function, which was reduced from previous echo in 2020. He is status post kidney transplant 04/2020. The patient denies feeling fluid-overloaded. Repeat 2D echocardiogram 04/05/2021 revealed normal LV function with LVEF 50-55%.      Neuro/Psych  Headaches PSYCHIATRIC DISORDERS Anxiety Depression       GI/Hepatic negative GI ROS,,,  Endo/Other  diabetes     Renal/GU Renal disease (ESRD s/p kidney transplant 2021)     Musculoskeletal  (+) Arthritis ,  Gout    Abdominal   Peds  Hematology  (+) Blood dyscrasia, anemia   Anesthesia Other Findings   Reproductive/Obstetrics                              Anesthesia Physical Anesthesia Plan  ASA: 3  Anesthesia Plan: MAC   Post-op Pain Management:    Induction: Intravenous  PONV Risk Score and Plan: 1 and Treatment may vary due to age or medical condition, Midazolam and TIVA  Airway Management Planned: Natural Airway and Nasal Cannula  Additional Equipment:   Intra-op Plan:   Post-operative Plan:   Informed Consent: I have reviewed the patients History and Physical, chart, labs and discussed the procedure including the risks, benefits and alternatives for the proposed anesthesia with the patient or authorized representative who has indicated his/her understanding and acceptance.     Dental advisory given  Plan Discussed with: CRNA  Anesthesia Plan Comments: (LMA/GETA backup discussed.  Patient consented for risks of anesthesia including but not limited to:  - adverse reactions to medications - damage to eyes, teeth, lips or other oral mucosa - nerve damage due to positioning  - sore throat or hoarseness - damage to heart, brain, nerves, lungs, other parts of body or loss of life  Informed patient about role of CRNA in peri- and intra-operative care.  Patient voiced understanding.)         Anesthesia Quick Evaluation

## 2023-05-22 NOTE — Discharge Instructions (Signed)

## 2023-05-25 ENCOUNTER — Other Ambulatory Visit: Payer: Self-pay | Admitting: Internal Medicine

## 2023-05-28 ENCOUNTER — Encounter: Payer: Self-pay | Admitting: Ophthalmology

## 2023-05-28 ENCOUNTER — Ambulatory Visit: Payer: Medicare HMO | Admitting: Anesthesiology

## 2023-05-28 ENCOUNTER — Encounter
Admission: RE | Disposition: A | Discharge status: Home / Self Care | Payer: Self-pay | Source: Home / Self Care | Attending: Ophthalmology

## 2023-05-28 ENCOUNTER — Other Ambulatory Visit: Payer: Self-pay

## 2023-05-28 ENCOUNTER — Ambulatory Visit: Admission: RE | Admit: 2023-05-28 | Payer: Medicare HMO | Source: Home / Self Care | Admitting: Ophthalmology

## 2023-05-28 DIAGNOSIS — Z87891 Personal history of nicotine dependence: Secondary | ICD-10-CM | POA: Insufficient documentation

## 2023-05-28 DIAGNOSIS — Z940 Kidney transplant status: Secondary | ICD-10-CM | POA: Diagnosis not present

## 2023-05-28 DIAGNOSIS — Z7984 Long term (current) use of oral hypoglycemic drugs: Secondary | ICD-10-CM | POA: Diagnosis not present

## 2023-05-28 DIAGNOSIS — I130 Hypertensive heart and chronic kidney disease with heart failure and stage 1 through stage 4 chronic kidney disease, or unspecified chronic kidney disease: Secondary | ICD-10-CM | POA: Diagnosis not present

## 2023-05-28 DIAGNOSIS — Z992 Dependence on renal dialysis: Secondary | ICD-10-CM | POA: Diagnosis not present

## 2023-05-28 DIAGNOSIS — I509 Heart failure, unspecified: Secondary | ICD-10-CM | POA: Insufficient documentation

## 2023-05-28 DIAGNOSIS — N1832 Chronic kidney disease, stage 3b: Secondary | ICD-10-CM | POA: Diagnosis not present

## 2023-05-28 DIAGNOSIS — H25011 Cortical age-related cataract, right eye: Secondary | ICD-10-CM | POA: Diagnosis not present

## 2023-05-28 DIAGNOSIS — E1122 Type 2 diabetes mellitus with diabetic chronic kidney disease: Secondary | ICD-10-CM | POA: Diagnosis not present

## 2023-05-28 DIAGNOSIS — N186 End stage renal disease: Secondary | ICD-10-CM | POA: Diagnosis not present

## 2023-05-28 DIAGNOSIS — I5032 Chronic diastolic (congestive) heart failure: Secondary | ICD-10-CM | POA: Diagnosis not present

## 2023-05-28 DIAGNOSIS — H2511 Age-related nuclear cataract, right eye: Secondary | ICD-10-CM | POA: Diagnosis not present

## 2023-05-28 DIAGNOSIS — Z5986 Financial insecurity: Secondary | ICD-10-CM | POA: Diagnosis not present

## 2023-05-28 DIAGNOSIS — E1136 Type 2 diabetes mellitus with diabetic cataract: Secondary | ICD-10-CM | POA: Insufficient documentation

## 2023-05-28 DIAGNOSIS — I132 Hypertensive heart and chronic kidney disease with heart failure and with stage 5 chronic kidney disease, or end stage renal disease: Secondary | ICD-10-CM | POA: Diagnosis not present

## 2023-05-28 HISTORY — PX: CATARACT EXTRACTION W/PHACO: SHX586

## 2023-05-28 LAB — GLUCOSE, CAPILLARY: Glucose-Capillary: 119 mg/dL — ABNORMAL HIGH (ref 70–99)

## 2023-05-28 SURGERY — PHACOEMULSIFICATION, CATARACT, WITH IOL INSERTION
Anesthesia: Monitor Anesthesia Care | Laterality: Right

## 2023-05-28 MED ORDER — SIGHTPATH DOSE#1 BSS IO SOLN
INTRAOCULAR | Status: DC | PRN
  Administered 2023-05-28: 63 mL via OPHTHALMIC

## 2023-05-28 MED ORDER — TETRACAINE HCL 0.5 % OP SOLN
1.0000 [drp] | OPHTHALMIC | Status: DC | PRN
  Administered 2023-05-28 (×3): 1 [drp] via OPHTHALMIC

## 2023-05-28 MED ORDER — MIDAZOLAM HCL 2 MG/2ML IJ SOLN
INTRAMUSCULAR | Status: DC | PRN
  Administered 2023-05-28: 2 mg via INTRAVENOUS

## 2023-05-28 MED ORDER — CYCLOPENTOLATE HCL 2 % OP SOLN
1.0000 [drp] | OPHTHALMIC | Status: DC | PRN
  Administered 2023-05-28 (×3): 1 [drp] via OPHTHALMIC

## 2023-05-28 MED ORDER — FENTANYL CITRATE (PF) 100 MCG/2ML IJ SOLN
INTRAMUSCULAR | Status: DC | PRN
  Administered 2023-05-28: 50 ug via INTRAVENOUS

## 2023-05-28 MED ORDER — SIGHTPATH DOSE#1 NA CHONDROIT SULF-NA HYALURON 40-17 MG/ML IO SOLN
INTRAOCULAR | Status: DC | PRN
  Administered 2023-05-28: 1 mL via INTRAOCULAR

## 2023-05-28 MED ORDER — SIGHTPATH DOSE#1 BSS IO SOLN
INTRAOCULAR | Status: DC | PRN
  Administered 2023-05-28: 15 mL via INTRAOCULAR

## 2023-05-28 MED ORDER — BRIMONIDINE TARTRATE-TIMOLOL 0.2-0.5 % OP SOLN
OPHTHALMIC | Status: DC | PRN
  Administered 2023-05-28: 1 [drp] via OPHTHALMIC

## 2023-05-28 MED ORDER — PHENYLEPHRINE HCL 10 % OP SOLN
1.0000 [drp] | OPHTHALMIC | Status: DC | PRN
  Administered 2023-05-28 (×3): 1 [drp] via OPHTHALMIC

## 2023-05-28 MED ORDER — MOXIFLOXACIN HCL 0.5 % OP SOLN
OPHTHALMIC | Status: DC | PRN
  Administered 2023-05-28: .2 mL via OPHTHALMIC

## 2023-05-28 MED ORDER — SIGHTPATH DOSE#1 BSS IO SOLN
INTRAOCULAR | Status: DC | PRN
  Administered 2023-05-28: 2 mL

## 2023-05-28 MED ORDER — LACTATED RINGERS IV SOLN: INTRAVENOUS | Status: DC

## 2023-05-28 SURGICAL SUPPLY — 13 items
ANGLE REVERSE CUT SHRT 25GA (CUTTER) ×1
CANNULA ANT/CHMB 27G (MISCELLANEOUS) IMPLANT
CANNULA ANT/CHMB 27GA (MISCELLANEOUS) IMPLANT
CATARACT SUITE SIGHTPATH (MISCELLANEOUS) ×1 IMPLANT
CYSTOTOME ANGL RVRS SHRT 25G (CUTTER) ×1 IMPLANT
CYSTOTOME ANGL RVRS SHRT 25GA (CUTTER) ×1 IMPLANT
FEE CATARACT SUITE SIGHTPATH (MISCELLANEOUS) ×1 IMPLANT
GLOVE BIOGEL PI IND STRL 8 (GLOVE) ×1 IMPLANT
GLOVE SURG ENC TEXT LTX SZ8 (GLOVE) ×1 IMPLANT
LENS IOL TECNIS EYHANCE 21.0 (Intraocular Lens) IMPLANT
NDL FILTER BLUNT 18X1 1/2 (NEEDLE) ×1 IMPLANT
NEEDLE FILTER BLUNT 18X1 1/2 (NEEDLE) ×1 IMPLANT
SYR 3ML LL SCALE MARK (SYRINGE) ×1 IMPLANT

## 2023-05-28 NOTE — Anesthesia Postprocedure Evaluation (Signed)
Anesthesia Post Note  Patient: Richard Chen  Procedure(s) Performed: CATARACT EXTRACTION PHACO AND INTRAOCULAR LENS PLACEMENT (IOC) RIGHT DIABETIC MALYUGIN VISION BLUE 4.54 00:31.7 (Right)  Patient location during evaluation: PACU Anesthesia Type: MAC Level of consciousness: awake and alert, oriented and patient cooperative Pain management: pain level controlled Vital Signs Assessment: post-procedure vital signs reviewed and stable Respiratory status: spontaneous breathing, nonlabored ventilation and respiratory function stable Cardiovascular status: blood pressure returned to baseline and stable Postop Assessment: adequate PO intake Anesthetic complications: no   No notable events documented.   Last Vitals:  Vitals:   05/28/23 1012 05/28/23 1015  BP:  (!) 153/79  Pulse: 72 67  Resp: 14 16  Temp:    SpO2: 100% 99%    Last Pain:  Vitals:   05/28/23 1015  TempSrc:   PainSc: 0-No pain                 Reed Breech

## 2023-05-28 NOTE — Op Note (Signed)
PREOPERATIVE DIAGNOSIS:  Nuclear sclerotic cataract of the right eye.   POSTOPERATIVE DIAGNOSIS:  Cataract   OPERATIVE PROCEDURE:ORPROCALL@   SURGEON:  Galen Manila, MD.   ANESTHESIA:  Anesthesiologist: Reed Breech, MD CRNA: Bynum, [place], CRNA  1.      Managed anesthesia care. 2.      0.42ml of Shugarcaine was instilled in the eye following the paracentesis.   COMPLICATIONS:  None.   TECHNIQUE:   Stop and chop   DESCRIPTION OF PROCEDURE:  The patient was examined and consented in the preoperative holding area where the aforementioned topical anesthesia was applied to the right eye and then brought back to the Operating Room where the right eye was prepped and draped in the usual sterile ophthalmic fashion and a lid speculum was placed. A paracentesis was created with the side port blade and the anterior chamber was filled with viscoelastic. A near clear corneal incision was performed with the steel keratome. A continuous curvilinear capsulorrhexis was performed with a cystotome followed by the capsulorrhexis forceps. Hydrodissection and hydrodelineation were carried out with BSS on a blunt cannula. The lens was removed in a stop and chop  technique and the remaining cortical material was removed with the irrigation-aspiration handpiece. The capsular bag was inflated with viscoelastic and the Technis [id number]  lens was placed in the capsular bag without complication. The remaining viscoelastic was removed from the eye with the irrigation-aspiration handpiece. The wounds were hydrated. The anterior chamber was flushed with BSS and the eye was inflated to physiologic pressure. 0.68ml of Vigamox was placed in the anterior chamber. The wounds were found to be water tight. The eye was dressed with Combigan. The patient was given protective glasses to wear throughout the day and a shield with which to sleep tonight. The patient was also given drops with which to begin a drop regimen today and will  follow-up with me in one day. Implant Name Type Inv. Item Serial No. Manufacturer Lot No. LRB No. Used Action  LENS IOL TECNIS EYHANCE 21.0 - [id number] Intraocular Lens LENS IOL TECNIS EYHANCE 21.0 [id number] [employer]  Right 1 Implanted   Procedure(s): CATARACT EXTRACTION PHACO AND INTRAOCULAR LENS PLACEMENT (IOC) RIGHT DIABETIC MALYUGIN VISION BLUE 4.54 00:31.7 (Right)  Electronically signed: Galen Manila 05/28/2023 10:07 AM
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
## 2023-05-28 NOTE — Transfer of Care (Signed)
Immediate Anesthesia Transfer of Care Note  Patient: Richard Chen  Procedure(s) Performed: CATARACT EXTRACTION PHACO AND INTRAOCULAR LENS PLACEMENT (IOC) RIGHT DIABETIC MALYUGIN VISION BLUE 4.54 00:31.7 (Right)  Patient Location: PACU  Anesthesia Type: MAC  Level of Consciousness: awake, alert  and patient cooperative  Airway and Oxygen Therapy: Patient Spontanous Breathing and Patient connected to supplemental oxygen  Post-op Assessment: Post-op Vital signs reviewed, Patient's Cardiovascular Status Stable, Respiratory Function Stable, Patent Airway and No signs of Nausea or vomiting  Post-op Vital Signs: Reviewed and stable  Complications: No notable events documented.

## 2023-05-29 ENCOUNTER — Encounter: Payer: Self-pay | Admitting: Ophthalmology

## 2023-06-10 ENCOUNTER — Encounter: Payer: Self-pay | Admitting: Internal Medicine

## 2023-06-10 ENCOUNTER — Ambulatory Visit (INDEPENDENT_AMBULATORY_CARE_PROVIDER_SITE_OTHER): Payer: Medicare HMO | Admitting: Internal Medicine

## 2023-06-10 VITALS — BP 138/80 | HR 69 | Temp 98.1°F | Ht 72.25 in | Wt 229.0 lb

## 2023-06-10 DIAGNOSIS — R053 Chronic cough: Secondary | ICD-10-CM | POA: Diagnosis not present

## 2023-06-10 DIAGNOSIS — M791 Myalgia, unspecified site: Secondary | ICD-10-CM | POA: Insufficient documentation

## 2023-06-10 DIAGNOSIS — R059 Cough, unspecified: Secondary | ICD-10-CM | POA: Insufficient documentation

## 2023-06-10 LAB — POC COVID19 BINAXNOW: SARS Coronavirus 2 Ag: NEGATIVE

## 2023-06-10 NOTE — Assessment & Plan Note (Signed)
And cramps Likely from the atorvastatin Will have them stop it for 2-4 weeks If the symptoms go away, he can restart at twice a week and even try every other day If even that causes symptoms, can try a different medication  If stopping the atorvastatin doesn't help, he will need to speak to his transplant team (and check magnesium, etc)

## 2023-06-10 NOTE — Addendum Note (Signed)
Addended by: Eual Fines on: 06/10/2023 08:31 AM   Modules accepted: Orders

## 2023-06-10 NOTE — Assessment & Plan Note (Signed)
No acute illness COVID negative Probably from drainage No action needed
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
## 2023-06-10 NOTE — Patient Instructions (Signed)
And cramps Likely from the atorvastatin Will have them stop it for 2-4 weeks If the symptoms go away, he can restart at twice a week and even try every other day If even that causes symptoms, can try a different medication  If stopping the atorvastatin doesn't help, he will need to speak to his transplant team (and check magnesium, etc)

## 2023-06-12 DIAGNOSIS — E1122 Type 2 diabetes mellitus with diabetic chronic kidney disease: Secondary | ICD-10-CM | POA: Diagnosis not present

## 2023-06-12 DIAGNOSIS — I5032 Chronic diastolic (congestive) heart failure: Secondary | ICD-10-CM | POA: Diagnosis not present

## 2023-06-12 DIAGNOSIS — E7800 Pure hypercholesterolemia, unspecified: Secondary | ICD-10-CM | POA: Diagnosis not present

## 2023-06-12 DIAGNOSIS — I700 Atherosclerosis of aorta: Secondary | ICD-10-CM | POA: Diagnosis not present

## 2023-06-12 DIAGNOSIS — R600 Localized edema: Secondary | ICD-10-CM | POA: Diagnosis not present

## 2023-06-12 DIAGNOSIS — R001 Bradycardia, unspecified: Secondary | ICD-10-CM | POA: Diagnosis not present

## 2023-06-12 DIAGNOSIS — R0602 Shortness of breath: Secondary | ICD-10-CM | POA: Diagnosis not present

## 2023-06-12 DIAGNOSIS — I10 Essential (primary) hypertension: Secondary | ICD-10-CM | POA: Diagnosis not present

## 2023-06-20 ENCOUNTER — Telehealth: Payer: Self-pay | Admitting: Internal Medicine

## 2023-06-20 DIAGNOSIS — R252 Cramp and spasm: Secondary | ICD-10-CM | POA: Diagnosis not present

## 2023-06-20 DIAGNOSIS — I10 Essential (primary) hypertension: Secondary | ICD-10-CM | POA: Diagnosis not present

## 2023-06-20 DIAGNOSIS — Z940 Kidney transplant status: Secondary | ICD-10-CM | POA: Diagnosis not present

## 2023-06-20 DIAGNOSIS — Z794 Long term (current) use of insulin: Secondary | ICD-10-CM | POA: Diagnosis not present

## 2023-06-20 DIAGNOSIS — D849 Immunodeficiency, unspecified: Secondary | ICD-10-CM | POA: Diagnosis not present

## 2023-06-20 DIAGNOSIS — E119 Type 2 diabetes mellitus without complications: Secondary | ICD-10-CM | POA: Diagnosis not present

## 2023-06-20 DIAGNOSIS — Z5181 Encounter for therapeutic drug level monitoring: Secondary | ICD-10-CM | POA: Diagnosis not present

## 2023-06-20 NOTE — Telephone Encounter (Signed)
Patients wife called requesting a phone call to see if she can  receive assistance with medication empagliflozin (JARDIANCE) 10 MG TABS tablet She said that it has doubled in price,and would like to see if he could receive assistance.

## 2023-06-21 ENCOUNTER — Other Ambulatory Visit: Payer: Medicare HMO | Admitting: Pharmacist

## 2023-06-21 NOTE — Patient Instructions (Signed)
Richard Chen and Richard Chen,  It was a pleasure speaking with you today! Thank you for allowing me to support you with your medications. As discussed, we will get the jardiance paperwork sent to you - please fill out and return using the return labeling.   If you have questions, you can reach me at [phone].  Take care and have a great weekend,  Elmarie Shiley, PharmD, BCPS Clinical Pharmacist [facility]

## 2023-06-21 NOTE — Progress Notes (Signed)
06/21/2023 Name: Richard Chen MRN: [id number] DOB: January 16, 1956    Richard Chen is a 67 y.o. year old male who presented for a telephone visit.   They were referred to the pharmacist by their PCP for assistance in managing medication access- jardiance cost.    Subjective:  Care Team: Primary Care Provider: Karie Schwalbe, MD  Medication Access/Adherence  Current Pharmacy:  [facility] [address] [place] - [address] [place] [address] Phone: [phone] Fax: [phone]  [facility] - [place], [place] - [address] [place] [address] Phone: [phone] Fax: [phone]   Patient reports affordability concerns with their medications: Yes Jardiance - $140 usually, now $240 per fill 90DS Patient reports access/transportation concerns to their pharmacy: No  Patient reports adherence concerns with their medications:  No  uses pill box    Medication Management:  Current adherence strategy: pill box,   Patient reports Good adherence to medications  Patient reports the following barriers to adherence: cost of jardiance, seeking financial support   Objective:  Lab Results  Component Value Date   HGBA1C 7.3 (A) 03/06/2023    Lab Results  Component Value Date   CREATININE 6.66 (H) 05/09/2020   BUN 37 (H) 05/09/2020   NA 136 05/09/2020   K 3.7 05/09/2020   CL 96 (L) 05/09/2020   CO2 30 05/09/2020    Lab Results  Component Value Date   CHOL 133 10/13/2019   HDL 42.90 10/13/2019   LDLCALC 65 10/13/2019   LDLDIRECT 166.5 10/26/2013   TRIG 125.0 10/13/2019   CHOLHDL 3 10/13/2019    Medications Reviewed Today     Reviewed by Gabriel Carina, RPH (Pharmacist) on 06/21/23 at 1042  Med List Status: <None>   Medication Order Taking? Sig Documenting Provider Last Dose Status Informant  Accu-Chek Softclix Lancets lancets [id number] Yes Use to obtain blood sugar dx code E11.29  Karie Schwalbe, MD Taking Active   acetaminophen (TYLENOL) 500 MG tablet [id number] Yes Take 500 mg by mouth every 6 (six) hours as needed (pain).  [provider] Taking Active Spouse/Significant Other  Alcohol Swabs (ALCOHOL PREP) PADS [id number] Yes 1 Units by Does not apply route daily. Karie Schwalbe, MD Taking Active Spouse/Significant Other  allopurinol (ZYLOPRIM) 100 MG tablet [id number] Yes Take 100 mg by mouth daily. [provider] Taking Active   atorvastatin (LIPITOR) 20 MG tablet [id number] No TAKE 1 TABLET EVERY DAY  Patient not taking: Reported on 06/21/2023   Karie Schwalbe, MD Not Taking Active   Blood Glucose Monitoring Suppl (ACCU-CHEK GUIDE) w/Device Andria Rhein [id number] Yes USE AS DIRECTED Karie Schwalbe, MD Taking Active   carvedilol (COREG) 3.125 MG tablet [id number] Yes Take 3.125 mg by mouth 2 (two) times daily. [provider] Taking Active   Cholecalciferol (VITAMIN D3) 50 MCG (2000 UT) CAPS [id number] Yes Take by mouth. [provider] Taking Active   citalopram (CELEXA) 20 MG tablet [id number] Yes TAKE 1 TABLET EVERY DAY Karie Schwalbe, MD Taking Active   Dulaglutide 4.5 MG/0.5ML SOPN [id number] Yes Inject 4.5 mg into the skin once a week. Inject into skin weekly [provider] Taking Active            Med Note Robyne Askew Dec 13, 2022  9:00 AM) Julious [facility] Cares PAP  empagliflozin (JARDIANCE) 10 MG TABS tablet [id number] Yes Take 10 mg by mouth daily. Su Hilt,  Charlaine Dalton, MD Taking Active   famotidine (PEPCID) 20 MG tablet [id number] Yes Take 20 mg by mouth daily. [provider] Taking Active   fexofenadine (ALLEGRA) 180 MG tablet [id number] Yes Take 180 mg daily by mouth. [provider] Taking Active Spouse/Significant Other  fluticasone (FLONASE) 50 MCG/ACT nasal spray [id number] Yes USE 2 SPRAYS IN EACH NOSTRIL EVERY DAY Karie Schwalbe, MD Taking Active   glipiZIDE (GLUCOTROL) 5 MG tablet  [id number] Yes TAKE 1 TABLET BY MOUTH TWICE DAILY BEFORE A MEAL Alphonsus Sias, Richard I, MD Taking Active   glucose blood (ACCU-CHEK GUIDE) test strip [id number] Yes Use to check blood sugar once a day Dx Code E11.29 Karie Schwalbe, MD Taking Active   mycophenolate (CELLCEPT) 250 MG capsule [id number] Yes Take 250 mg by mouth. Take 1 in the morning and 1 at bedtime (08/18/21) [provider] Taking Active Self  predniSONE (DELTASONE) 5 MG tablet [id number] Yes 5 mg daily. [provider] Taking Active Self  senna-docusate (SENOKOT-S) 8.6-50 MG tablet [id number] Yes Take by mouth 2 (two) times daily as needed. [provider] Taking Active   sildenafil (VIAGRA) 100 MG tablet [id number] No Take 1 tablet (100 mg total) by mouth daily as needed for erectile dysfunction. Take two hours prior to intercourse on an empty stomach  Patient not taking: Reported on 06/21/2023   Harle Battiest, PA-C Not Taking Active   tacrolimus (PROGRAF) 1 MG capsule [id number] Yes 4 tablets qAM and 4 tablets qPM changed 07/06/21 [provider] Taking Active Self              Assessment/Plan:   Medication Management: - Currently strategy sufficient to maintain appropriate adherence to prescribed medication regimen - Assessed household income for medicare LIS/Extra Help, unfortunately exceeds income threshold - Assessed for Jardiance PAP, patient DOES meet eligibility, will facilitate patient assistance application to initiate with our rx med assistance team.   Follow Up Plan: 2 weeks to ensure application is in process  Lynnda Shields, PharmD, BCPS Clinical Pharmacist [facility]

## 2023-07-05 ENCOUNTER — Other Ambulatory Visit: Payer: Medicare HMO | Admitting: Pharmacist

## 2023-07-05 NOTE — Progress Notes (Signed)
Care Coordination Call  Outreached patient for scheduled phone appointment to discuss medication access: jardiance patient assistance  S/O: - Patient reports he has not yet received Jardiance application in the mail - Patient reports no new medication changes or concerns at this time  A/P: - Will investigate, initiate application to be sent via rx med assistance team  Follow Up: 1 week to assess ongoing progress   Lynnda Shields, PharmD, BCPS Clinical Pharmacist [facility]

## 2023-07-08 DIAGNOSIS — Z940 Kidney transplant status: Secondary | ICD-10-CM | POA: Diagnosis not present

## 2023-07-08 DIAGNOSIS — Z5181 Encounter for therapeutic drug level monitoring: Secondary | ICD-10-CM | POA: Diagnosis not present

## 2023-07-10 DIAGNOSIS — H524 Presbyopia: Secondary | ICD-10-CM | POA: Diagnosis not present

## 2023-07-11 ENCOUNTER — Other Ambulatory Visit: Payer: Medicare HMO | Admitting: Pharmacist

## 2023-07-12 ENCOUNTER — Telehealth: Payer: Self-pay

## 2023-07-12 ENCOUNTER — Other Ambulatory Visit: Payer: Medicare HMO | Admitting: Pharmacist

## 2023-07-12 NOTE — Telephone Encounter (Signed)
-----   Message from Gabriel Carina sent at 07/05/2023 10:43 AM EDT ----- Hi, Can you please initiate PAP for jardiance 10mg  daily, sending pt portion + return labeling to his home?   I actually started work with this patient a few weeks ago but cannot find chart routing, so I fear it may have been a delay/missed routing on my part. He will keep a close eye out for it and send back quickly.  Thank you, Thurston Hole

## 2023-07-12 NOTE — Telephone Encounter (Signed)
[employer] application for Jardiance assistance will be placed in mail on 07/16/23. Let me know if it needs to be sent sooner & I can send to someone in office.

## 2023-07-12 NOTE — Progress Notes (Signed)
Care Coordination Call  Outreached patient for scheduled phone appointment to discuss medication access: jardiance patient assistance  S/O: - Patient reports he has not yet received Jardiance application in the mail - Patient reports no new medication changes or concerns at this time  A/P: - Investigated with rx med assitance team, patients application is due to be mailed out Tuesday 07/16/23.  - Contacted patient back to relay this information, he will await receipt of paperwork in mail, fill out, and return.  Follow Up: 2-3 weeks to assess ongoing progress   Lynnda Shields, PharmD, BCPS Clinical Pharmacist [facility]

## 2023-07-30 ENCOUNTER — Other Ambulatory Visit: Payer: Medicare HMO | Admitting: Pharmacist

## 2023-07-30 NOTE — Patient Instructions (Addendum)
Kirt, Thank you for speaking with me today. As discussed, send your jardiance application back in the mail using return envelope provided. If you receive a letter from social security about the medicare extra help, please still keep that document, just in case the jardiance company wants a copy as part of the application. However, my hope is that they will not need it and we can keep the paperwork in motion to avoid you running out.  If you have questions or need me, my number is [phone].  Otherwise I will touch base in ~2 weeks!  Take care, Elmarie Shiley, PharmD, BCPS Clinical Pharmacist [facility]

## 2023-07-30 NOTE — Progress Notes (Signed)
Care Coordination Call  Outreached patient for scheduled phone appointment to discuss medication access: jardiance patient assistance  S/O: - Patient reports he has received jardiance application in mail, filled out paperwork, applied to medicare LIS in order to attach a denial letter. Patient applied for Medicare LIS ~10/3 or 10/4, based on income screening, they will likely be denied. - Patient reports no new medication changes or concerns at this time  A/P: - Assessed income for the need to attach denial letter, [facility] Cares website does not specify nor does PDF application form. I suspect that because income >150% FPL but <250%, patient is eligible for jardiance assistance but would not need the LIS denial letter. - Recommended patient send application back to med assistance team in the mail to move forward. - Patient will still hold on to LIS denial letter once received in case the application requires.  Follow Up: 2-3 weeks to assess ongoing progress   Lynnda Shields, PharmD, BCPS Clinical Pharmacist [facility]

## 2023-08-06 ENCOUNTER — Other Ambulatory Visit: Payer: Medicare HMO | Admitting: Pharmacist

## 2023-08-06 NOTE — Progress Notes (Signed)
08/06/2023 Name: NHUT NAQVI MRN: [id number] DOB: 1956-08-13  Chief Complaint  Patient presents with   Medication Assistance    Care Coordination  KALET VANWYE is a 67 y.o. year old male who presented for a telephone visit. Lynden Ang, spouse, contacted me to report receiving a denial letter for medicare extra help program.  S/O: Jardiance application in progress, patient mailed their portion to rx med assistance team.   A/P: - Advised patient to keep denial letter in case jardiance program needs it for the application, but no need to send it right away. - Will follow for updates to jardiance patient assistance progress  Follow-up: next week as previously scheduled   Lynnda Shields, PharmD, BCPS Clinical Pharmacist [facility]

## 2023-08-13 NOTE — Telephone Encounter (Signed)
Rec'd completed patient portion.  Faxed provider page to pcp.

## 2023-08-14 ENCOUNTER — Other Ambulatory Visit: Payer: Self-pay | Admitting: Pharmacist

## 2023-08-14 NOTE — Progress Notes (Signed)
08/14/2023 Name: Richard Chen MRN: [id number] DOB: [birth year]  Chief Complaint  Patient presents with   Medication Assistance   Diabetes    Care Coordination  ERIK DEBENEDETTI is a 67 y.o. year old male who presented for a telephone visit. Lynden Ang, spouse, contacted me to report receiving a denial letter for medicare extra help program.  S/O: Jardiance application in progress, patient mailed their portion to rx med assistance team. It is pending provider portion from PCP.   Reviewed medication list, no changes or needs at this time. Patient reports blood glucose readings are stable and going well.  A/P: - Advised patient to keep denial letter in case jardiance program needs it for the application, but no need to send it right away. - Will facilitate via rx med assistance team to send patient Trulicity 2025 renewal application - Will follow for updates to jardiance patient assistance progress  Follow-up: no follow up scheduled at this time, recommend to outreach with updates when available  Lynnda Shields, PharmD, BCPS Clinical Pharmacist [facility]

## 2023-09-04 DIAGNOSIS — Z940 Kidney transplant status: Secondary | ICD-10-CM | POA: Diagnosis not present

## 2023-09-05 ENCOUNTER — Telehealth: Payer: Self-pay

## 2023-09-05 NOTE — Telephone Encounter (Signed)
-----   Message from Gabriel Carina sent at 08/14/2023 10:48 AM EDT ----- Hi, Can you please send patient application for 2025 trulicity renewal (lillycares)? Thank you, Thurston Hole

## 2023-09-10 NOTE — Progress Notes (Signed)
Pharmacy Medication Assistance Program Note    09/10/2023  Patient ID: Richard Chen, male   DOB: 1956-01-28, 67 y.o.   MRN: [id number]     09/05/2023  Outreach Medication One  Initial Outreach Date (Medication One) 09/05/2023  Manufacturer Medication One Retail [employer] Trulicity  Dose of Trulicity 4.5MG   Type of Radiographer, therapeutic Assistance  Date Application Sent to Patient 09/09/2023  Application Items Requested Application;Proof of Income  Name of Prescriber Karie Schwalbe, MD       Signature

## 2023-09-10 NOTE — Telephone Encounter (Signed)
PAP: PAP application for Trulicity, [employer]) has been mailed to pt's home address on file. Will fax provider portion of application to provider's office when pt's portion is received.  PLEASE BE ADVISED

## 2023-09-13 ENCOUNTER — Encounter: Payer: Self-pay | Admitting: Internal Medicine

## 2023-09-13 NOTE — Telephone Encounter (Signed)
error 

## 2023-09-16 ENCOUNTER — Telehealth: Payer: Self-pay | Admitting: Internal Medicine

## 2023-09-16 ENCOUNTER — Encounter: Payer: Self-pay | Admitting: Internal Medicine

## 2023-09-16 NOTE — Telephone Encounter (Signed)
Pt called back again regarding the meds, empagliflozin (JARDIANCE) 10 MG TABS tablet. Pt stated he has some questions/concerns about meds & requested a call back from [facility]. Call back # [phone].

## 2023-09-16 NOTE — Telephone Encounter (Signed)
Pt called in and he would like a call back regarding his medication pt stated that he can't afford the meds

## 2023-09-16 NOTE — Telephone Encounter (Signed)
His BI PAP application got lost in the [employer] closure. I will work on doing a Ambulance person.

## 2023-09-17 NOTE — Telephone Encounter (Signed)
Pt will come by the office tomorrow (09-18-23) to sign application on my desk for [facility] Patient assistance.

## 2023-09-17 NOTE — Telephone Encounter (Signed)
Application is on my desk. Pt will come by the office tomorrow (11-27) to sign.

## 2023-09-18 ENCOUNTER — Telehealth: Payer: Self-pay | Admitting: Internal Medicine

## 2023-09-18 NOTE — Telephone Encounter (Signed)
Patient dropped off document Patient Assistance ApplicationPatient requested to send it back via fax within 5-days. Document is located in providers tray at front office.Please advise at [facility] [phone]

## 2023-09-18 NOTE — Telephone Encounter (Signed)
Form faxed to [facility] PAP.  Received fax confirmation.  Forms placed in scan tray.

## 2023-09-18 NOTE — Telephone Encounter (Signed)
Patient came by to sign patient assistance application,placed in [place] folder upfront.

## 2023-09-25 NOTE — Telephone Encounter (Signed)
Application for [employer] for his [place] has been faxed.
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
## 2023-10-03 ENCOUNTER — Other Ambulatory Visit: Payer: Medicare HMO

## 2023-10-03 ENCOUNTER — Other Ambulatory Visit: Payer: Self-pay

## 2023-10-03 DIAGNOSIS — N138 Other obstructive and reflux uropathy: Secondary | ICD-10-CM | POA: Diagnosis not present

## 2023-10-03 DIAGNOSIS — N401 Enlarged prostate with lower urinary tract symptoms: Secondary | ICD-10-CM | POA: Diagnosis not present

## 2023-10-04 LAB — PSA: Prostate Specific Ag, Serum: 0.7 ng/mL (ref 0.0–4.0)

## 2023-10-08 ENCOUNTER — Encounter: Payer: Self-pay | Admitting: Urology

## 2023-10-08 ENCOUNTER — Ambulatory Visit: Payer: Medicare HMO | Admitting: Urology

## 2023-10-08 VITALS — BP 137/71 | HR 73 | Ht 73.0 in | Wt 226.0 lb

## 2023-10-08 DIAGNOSIS — N5201 Erectile dysfunction due to arterial insufficiency: Secondary | ICD-10-CM

## 2023-10-08 DIAGNOSIS — R319 Hematuria, unspecified: Secondary | ICD-10-CM | POA: Diagnosis not present

## 2023-10-08 DIAGNOSIS — N401 Enlarged prostate with lower urinary tract symptoms: Secondary | ICD-10-CM | POA: Diagnosis not present

## 2023-10-08 DIAGNOSIS — K4090 Unilateral inguinal hernia, without obstruction or gangrene, not specified as recurrent: Secondary | ICD-10-CM

## 2023-10-08 DIAGNOSIS — Z87898 Personal history of other specified conditions: Secondary | ICD-10-CM | POA: Diagnosis not present

## 2023-10-08 DIAGNOSIS — N138 Other obstructive and reflux uropathy: Secondary | ICD-10-CM

## 2023-10-08 LAB — URINALYSIS, COMPLETE
Bilirubin, UA: NEGATIVE
Ketones, UA: NEGATIVE
Leukocytes,UA: NEGATIVE
Nitrite, UA: NEGATIVE
Protein,UA: NEGATIVE
RBC, UA: NEGATIVE
Specific Gravity, UA: 1.015 (ref 1.005–1.030)
Urobilinogen, Ur: 0.2 mg/dL (ref 0.2–1.0)
pH, UA: 5.5 (ref 5.0–7.5)

## 2023-10-08 LAB — MICROSCOPIC EXAMINATION: Bacteria, UA: NONE SEEN

## 2023-10-10 ENCOUNTER — Telehealth: Payer: Self-pay

## 2023-10-10 NOTE — Telephone Encounter (Signed)
REACHED OUT TO PT FOR APPLICATION FOR JARDIANCE  PLEASE BE ADVISED

## 2023-10-11 NOTE — Telephone Encounter (Signed)
Jardiance application faxed to [place] [facility], again.

## 2023-10-31 DIAGNOSIS — I110 Hypertensive heart disease with heart failure: Secondary | ICD-10-CM | POA: Diagnosis not present

## 2023-10-31 DIAGNOSIS — K4090 Unilateral inguinal hernia, without obstruction or gangrene, not specified as recurrent: Secondary | ICD-10-CM | POA: Diagnosis not present

## 2023-10-31 DIAGNOSIS — E119 Type 2 diabetes mellitus without complications: Secondary | ICD-10-CM | POA: Diagnosis not present

## 2023-10-31 DIAGNOSIS — D84821 Immunodeficiency due to drugs: Secondary | ICD-10-CM | POA: Diagnosis not present

## 2023-10-31 DIAGNOSIS — I5032 Chronic diastolic (congestive) heart failure: Secondary | ICD-10-CM | POA: Diagnosis not present

## 2023-10-31 DIAGNOSIS — Z940 Kidney transplant status: Secondary | ICD-10-CM | POA: Diagnosis not present

## 2023-11-11 ENCOUNTER — Ambulatory Visit: Payer: Medicare HMO | Admitting: Internal Medicine

## 2023-11-11 ENCOUNTER — Encounter: Payer: Self-pay | Admitting: Internal Medicine

## 2023-11-11 VITALS — BP 138/88 | HR 78 | Temp 98.1°F | Ht 71.75 in | Wt 233.0 lb

## 2023-11-11 DIAGNOSIS — E1121 Type 2 diabetes mellitus with diabetic nephropathy: Secondary | ICD-10-CM

## 2023-11-11 DIAGNOSIS — Z0000 Encounter for general adult medical examination without abnormal findings: Secondary | ICD-10-CM | POA: Diagnosis not present

## 2023-11-11 DIAGNOSIS — S069XAS Unspecified intracranial injury with loss of consciousness status unknown, sequela: Secondary | ICD-10-CM

## 2023-11-11 DIAGNOSIS — Z7984 Long term (current) use of oral hypoglycemic drugs: Secondary | ICD-10-CM

## 2023-11-11 DIAGNOSIS — N1832 Chronic kidney disease, stage 3b: Secondary | ICD-10-CM

## 2023-11-11 DIAGNOSIS — F39 Unspecified mood [affective] disorder: Secondary | ICD-10-CM

## 2023-11-11 DIAGNOSIS — R4689 Other symptoms and signs involving appearance and behavior: Secondary | ICD-10-CM

## 2023-11-11 DIAGNOSIS — I5032 Chronic diastolic (congestive) heart failure: Secondary | ICD-10-CM

## 2023-11-11 DIAGNOSIS — E119 Type 2 diabetes mellitus without complications: Secondary | ICD-10-CM | POA: Insufficient documentation

## 2023-11-11 DIAGNOSIS — I700 Atherosclerosis of aorta: Secondary | ICD-10-CM | POA: Diagnosis not present

## 2023-11-11 LAB — HM DIABETES FOOT EXAM
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
## 2023-11-11 NOTE — Assessment & Plan Note (Signed)
Stable cognitive problems
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
## 2023-11-11 NOTE — Assessment & Plan Note (Signed)
Compensated since the renal transplant Is on jardiance, carvedilol 6.25 bid

## 2023-11-11 NOTE — Assessment & Plan Note (Addendum)
Last A1c 7.3% at [facility]--and getting checked there again soon On jardiance 10, glipizide 5 bid, trulicity 4.5 weekly

## 2023-11-11 NOTE — Assessment & Plan Note (Signed)
I have personally reviewed the Medicare Annual Wellness questionnaire and have noted 1. The patient's medical and social history 2. Their use of alcohol, tobacco or illicit drugs 3. Their current medications and supplements 4. The patient's functional ability including ADL's, fall risks, home safety risks and hearing or visual             impairment. 5. Diet and physical activities 6. Evidence for depression or mood disorders  The patients weight, height, BMI and visual acuity have been recorded in the chart I have made referrals, counseling and provided education to the patient based review of the above and I have provided the pt with a written personalized care plan for preventive services.  I have provided you with a copy of your personalized plan for preventive services. Please take the time to review along with your updated medication list.  Colon due 2030 Just had PSA checked at [facility] Discussed exercise Had flu vaccine--needs COVID and RSV vaccines

## 2023-11-11 NOTE — Progress Notes (Signed)
Hearing Screening - Comments:: Passed whisper test Vision Screening - Comments:: September 2024

## 2023-11-11 NOTE — Assessment & Plan Note (Signed)
On imaging Is on the atorvastatin 10

## 2023-11-11 NOTE — Assessment & Plan Note (Signed)
Stable with renal transplant--followed at [facility] transplant

## 2023-11-14 NOTE — Telephone Encounter (Addendum)
(  Originally started on 07/12/23 telephone note)   Received notification from [employer]) regarding approval for [employer]. Patient assistance approved from 09/30/23 to 10/21/24.  Medication will ship to patients home  Company phone: [phone]

## 2023-11-14 NOTE — Telephone Encounter (Signed)
Continued on 10/10/23 telephone encounter.

## 2023-11-25 DIAGNOSIS — R001 Bradycardia, unspecified: Secondary | ICD-10-CM | POA: Diagnosis not present

## 2023-11-25 DIAGNOSIS — E7800 Pure hypercholesterolemia, unspecified: Secondary | ICD-10-CM | POA: Diagnosis not present

## 2023-11-25 DIAGNOSIS — I700 Atherosclerosis of aorta: Secondary | ICD-10-CM | POA: Diagnosis not present

## 2023-11-25 DIAGNOSIS — I5032 Chronic diastolic (congestive) heart failure: Secondary | ICD-10-CM | POA: Diagnosis not present

## 2023-11-25 DIAGNOSIS — Z940 Kidney transplant status: Secondary | ICD-10-CM | POA: Diagnosis not present

## 2023-11-25 DIAGNOSIS — I161 Hypertensive emergency: Secondary | ICD-10-CM | POA: Diagnosis not present

## 2023-11-25 DIAGNOSIS — R0602 Shortness of breath: Secondary | ICD-10-CM | POA: Diagnosis not present

## 2023-11-25 DIAGNOSIS — I10 Essential (primary) hypertension: Secondary | ICD-10-CM | POA: Diagnosis not present

## 2023-11-26 DIAGNOSIS — K4090 Unilateral inguinal hernia, without obstruction or gangrene, not specified as recurrent: Secondary | ICD-10-CM | POA: Diagnosis not present

## 2023-11-26 DIAGNOSIS — I5032 Chronic diastolic (congestive) heart failure: Secondary | ICD-10-CM | POA: Diagnosis not present

## 2023-11-26 DIAGNOSIS — Z01818 Encounter for other preprocedural examination: Secondary | ICD-10-CM | POA: Diagnosis not present

## 2023-11-26 DIAGNOSIS — I700 Atherosclerosis of aorta: Secondary | ICD-10-CM | POA: Diagnosis not present

## 2023-11-26 DIAGNOSIS — M109 Gout, unspecified: Secondary | ICD-10-CM | POA: Diagnosis not present

## 2023-11-26 DIAGNOSIS — J45909 Unspecified asthma, uncomplicated: Secondary | ICD-10-CM | POA: Diagnosis not present

## 2023-11-26 DIAGNOSIS — R001 Bradycardia, unspecified: Secondary | ICD-10-CM | POA: Diagnosis not present

## 2023-11-26 DIAGNOSIS — E7800 Pure hypercholesterolemia, unspecified: Secondary | ICD-10-CM | POA: Diagnosis not present

## 2023-11-26 DIAGNOSIS — I130 Hypertensive heart and chronic kidney disease with heart failure and stage 1 through stage 4 chronic kidney disease, or unspecified chronic kidney disease: Secondary | ICD-10-CM | POA: Diagnosis not present

## 2023-11-26 DIAGNOSIS — I129 Hypertensive chronic kidney disease with stage 1 through stage 4 chronic kidney disease, or unspecified chronic kidney disease: Secondary | ICD-10-CM | POA: Diagnosis not present

## 2023-12-10 DIAGNOSIS — N189 Chronic kidney disease, unspecified: Secondary | ICD-10-CM | POA: Diagnosis not present

## 2023-12-10 DIAGNOSIS — E1122 Type 2 diabetes mellitus with diabetic chronic kidney disease: Secondary | ICD-10-CM | POA: Diagnosis not present

## 2023-12-10 DIAGNOSIS — I130 Hypertensive heart and chronic kidney disease with heart failure and stage 1 through stage 4 chronic kidney disease, or unspecified chronic kidney disease: Secondary | ICD-10-CM | POA: Diagnosis not present

## 2023-12-10 DIAGNOSIS — K219 Gastro-esophageal reflux disease without esophagitis: Secondary | ICD-10-CM | POA: Diagnosis not present

## 2023-12-10 DIAGNOSIS — F0390 Unspecified dementia without behavioral disturbance: Secondary | ICD-10-CM | POA: Diagnosis not present

## 2023-12-10 DIAGNOSIS — K4090 Unilateral inguinal hernia, without obstruction or gangrene, not specified as recurrent: Secondary | ICD-10-CM | POA: Diagnosis not present

## 2023-12-10 DIAGNOSIS — I5032 Chronic diastolic (congestive) heart failure: Secondary | ICD-10-CM | POA: Diagnosis not present

## 2023-12-10 DIAGNOSIS — E7800 Pure hypercholesterolemia, unspecified: Secondary | ICD-10-CM | POA: Diagnosis not present

## 2023-12-10 DIAGNOSIS — J45909 Unspecified asthma, uncomplicated: Secondary | ICD-10-CM | POA: Diagnosis not present

## 2023-12-11 ENCOUNTER — Other Ambulatory Visit: Payer: Self-pay | Admitting: Internal Medicine

## 2023-12-20 DIAGNOSIS — Z5181 Encounter for therapeutic drug level monitoring: Secondary | ICD-10-CM | POA: Diagnosis not present

## 2023-12-20 DIAGNOSIS — Z23 Encounter for immunization: Secondary | ICD-10-CM | POA: Diagnosis not present

## 2023-12-20 DIAGNOSIS — I10 Essential (primary) hypertension: Secondary | ICD-10-CM | POA: Diagnosis not present

## 2023-12-20 DIAGNOSIS — Z79621 Long term (current) use of calcineurin inhibitor: Secondary | ICD-10-CM | POA: Diagnosis not present

## 2023-12-20 DIAGNOSIS — E7800 Pure hypercholesterolemia, unspecified: Secondary | ICD-10-CM | POA: Diagnosis not present

## 2023-12-20 DIAGNOSIS — R1909 Other intra-abdominal and pelvic swelling, mass and lump: Secondary | ICD-10-CM | POA: Diagnosis not present

## 2023-12-20 DIAGNOSIS — D84821 Immunodeficiency due to drugs: Secondary | ICD-10-CM | POA: Diagnosis not present

## 2023-12-20 DIAGNOSIS — E119 Type 2 diabetes mellitus without complications: Secondary | ICD-10-CM | POA: Diagnosis not present

## 2023-12-20 DIAGNOSIS — Z940 Kidney transplant status: Secondary | ICD-10-CM | POA: Diagnosis not present

## 2023-12-20 DIAGNOSIS — Z79899 Other long term (current) drug therapy: Secondary | ICD-10-CM | POA: Diagnosis not present

## 2023-12-20 DIAGNOSIS — Z4822 Encounter for aftercare following kidney transplant: Secondary | ICD-10-CM | POA: Diagnosis not present

## 2023-12-26 DIAGNOSIS — K91872 Postprocedural seroma of a digestive system organ or structure following a digestive system procedure: Secondary | ICD-10-CM | POA: Diagnosis not present

## 2023-12-26 DIAGNOSIS — K4090 Unilateral inguinal hernia, without obstruction or gangrene, not specified as recurrent: Secondary | ICD-10-CM | POA: Diagnosis not present

## 2024-01-20 DIAGNOSIS — H25012 Cortical age-related cataract, left eye: Secondary | ICD-10-CM | POA: Diagnosis not present

## 2024-01-20 DIAGNOSIS — Z961 Presence of intraocular lens: Secondary | ICD-10-CM | POA: Diagnosis not present

## 2024-01-20 DIAGNOSIS — E119 Type 2 diabetes mellitus without complications: Secondary | ICD-10-CM | POA: Diagnosis not present

## 2024-01-20 LAB — HM DIABETES EYE EXAM

## 2024-03-04 DIAGNOSIS — Z5181 Encounter for therapeutic drug level monitoring: Secondary | ICD-10-CM | POA: Diagnosis not present

## 2024-03-04 DIAGNOSIS — D849 Immunodeficiency, unspecified: Secondary | ICD-10-CM | POA: Diagnosis not present

## 2024-03-04 DIAGNOSIS — Z794 Long term (current) use of insulin: Secondary | ICD-10-CM | POA: Diagnosis not present

## 2024-03-04 DIAGNOSIS — Z940 Kidney transplant status: Secondary | ICD-10-CM | POA: Diagnosis not present

## 2024-03-04 DIAGNOSIS — I10 Essential (primary) hypertension: Secondary | ICD-10-CM | POA: Diagnosis not present

## 2024-03-04 DIAGNOSIS — E119 Type 2 diabetes mellitus without complications: Secondary | ICD-10-CM | POA: Diagnosis not present

## 2024-05-12 ENCOUNTER — Ambulatory Visit: Payer: Medicare HMO | Admitting: Internal Medicine

## 2024-05-12 ENCOUNTER — Ambulatory Visit: Payer: Self-pay | Admitting: Internal Medicine

## 2024-05-12 ENCOUNTER — Encounter: Payer: Self-pay | Admitting: Internal Medicine

## 2024-05-12 VITALS — BP 136/82 | HR 60 | Temp 98.5°F | Ht 71.75 in | Wt 224.0 lb

## 2024-05-12 DIAGNOSIS — E1121 Type 2 diabetes mellitus with diabetic nephropathy: Secondary | ICD-10-CM | POA: Diagnosis not present

## 2024-05-12 DIAGNOSIS — I5032 Chronic diastolic (congestive) heart failure: Secondary | ICD-10-CM | POA: Diagnosis not present

## 2024-05-12 DIAGNOSIS — F39 Unspecified mood [affective] disorder: Secondary | ICD-10-CM

## 2024-05-12 DIAGNOSIS — Z940 Kidney transplant status: Secondary | ICD-10-CM

## 2024-05-12 DIAGNOSIS — Z7985 Long-term (current) use of injectable non-insulin antidiabetic drugs: Secondary | ICD-10-CM | POA: Diagnosis not present

## 2024-05-12 LAB — POCT GLYCOSYLATED HEMOGLOBIN (HGB A1C): Hemoglobin A1C: 7.1 % — AB (ref 4.0–5.6)

## 2024-05-12 NOTE — Assessment & Plan Note (Signed)
 Some irritability--controlled with the citalopram  20mg  daily

## 2024-05-12 NOTE — Assessment & Plan Note (Signed)
 A1c down to 7.1% Will stop the glipizide  due to hypoglycemia---if sugars go up a lot, can restart at 2.5mg  daily Dulaglutide 4.5mg  weekly,

## 2024-05-12 NOTE — Assessment & Plan Note (Signed)
 Follows with [facility] transplant team Same immunosuppresion

## 2024-05-12 NOTE — Assessment & Plan Note (Signed)
 Is compensated--with adequate renal function from transplant Carvedilol 6.25 bid
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
## 2024-05-12 NOTE — Addendum Note (Signed)
 Addended by: KALLIE CLOTILDA SQUIBB on: 05/12/2024 09:48 AM   Modules accepted: Orders

## 2024-05-15 ENCOUNTER — Other Ambulatory Visit: Payer: Self-pay | Admitting: Internal Medicine

## 2024-05-16 ENCOUNTER — Other Ambulatory Visit: Payer: Self-pay | Admitting: Internal Medicine

## 2024-05-25 DIAGNOSIS — R0602 Shortness of breath: Secondary | ICD-10-CM | POA: Diagnosis not present

## 2024-05-25 DIAGNOSIS — R001 Bradycardia, unspecified: Secondary | ICD-10-CM | POA: Diagnosis not present

## 2024-05-25 DIAGNOSIS — E1122 Type 2 diabetes mellitus with diabetic chronic kidney disease: Secondary | ICD-10-CM | POA: Diagnosis not present

## 2024-05-25 DIAGNOSIS — E7800 Pure hypercholesterolemia, unspecified: Secondary | ICD-10-CM | POA: Diagnosis not present

## 2024-05-25 DIAGNOSIS — Z940 Kidney transplant status: Secondary | ICD-10-CM | POA: Diagnosis not present

## 2024-05-25 DIAGNOSIS — I5032 Chronic diastolic (congestive) heart failure: Secondary | ICD-10-CM | POA: Diagnosis not present

## 2024-06-06 ENCOUNTER — Encounter: Payer: Self-pay | Admitting: Internal Medicine

## 2024-06-08 MED ORDER — ACCU-CHEK SOFTCLIX LANCETS MISC: 4 refills | Status: AC

## 2024-06-12 ENCOUNTER — Other Ambulatory Visit: Payer: Self-pay | Admitting: Internal Medicine

## 2024-06-19 DIAGNOSIS — Z794 Long term (current) use of insulin: Secondary | ICD-10-CM | POA: Diagnosis not present

## 2024-06-19 DIAGNOSIS — E119 Type 2 diabetes mellitus without complications: Secondary | ICD-10-CM | POA: Diagnosis not present

## 2024-06-19 DIAGNOSIS — R799 Abnormal finding of blood chemistry, unspecified: Secondary | ICD-10-CM | POA: Diagnosis not present

## 2024-06-19 DIAGNOSIS — I10 Essential (primary) hypertension: Secondary | ICD-10-CM | POA: Diagnosis not present

## 2024-06-19 DIAGNOSIS — Z940 Kidney transplant status: Secondary | ICD-10-CM | POA: Diagnosis not present

## 2024-06-19 DIAGNOSIS — Z5181 Encounter for therapeutic drug level monitoring: Secondary | ICD-10-CM | POA: Diagnosis not present

## 2024-06-19 DIAGNOSIS — R9389 Abnormal findings on diagnostic imaging of other specified body structures: Secondary | ICD-10-CM | POA: Diagnosis not present

## 2024-06-19 DIAGNOSIS — D849 Immunodeficiency, unspecified: Secondary | ICD-10-CM | POA: Diagnosis not present

## 2024-08-10 ENCOUNTER — Telehealth: Payer: Self-pay

## 2024-08-10 NOTE — Telephone Encounter (Signed)
 PAP renewal for trulicity through [facility]. Mailed pt portion and will fax provider portion

## 2024-08-17 ENCOUNTER — Telehealth: Payer: Self-pay

## 2024-08-17 NOTE — Telephone Encounter (Signed)
 Bicares jardiance. Filled and mailed pt portion today, will fax provider portion

## 2024-08-21 NOTE — Telephone Encounter (Signed)
 Provider portion re-done to a new provider in the office Dr Jimmy is no longer at this office

## 2024-08-21 NOTE — Telephone Encounter (Signed)
 Form received and placed in Dr. Graham inbox for review

## 2024-08-24 NOTE — Telephone Encounter (Signed)
 Done and in IN box

## 2024-08-26 ENCOUNTER — Other Ambulatory Visit (HOSPITAL_COMMUNITY): Payer: Self-pay

## 2024-08-26 NOTE — Telephone Encounter (Signed)
 Received [facility] provider portion, faxed to [facility] along pt portion and proof of income.

## 2024-08-26 NOTE — Telephone Encounter (Signed)
 Received pt portion Richard Chen and [employer] today.

## 2024-08-26 NOTE — Telephone Encounter (Signed)
 Received Lupie Needle from provider office faxed to [facility] along pt portion and proof of income.

## 2024-08-31 ENCOUNTER — Other Ambulatory Visit (HOSPITAL_COMMUNITY): Payer: Self-pay

## 2024-08-31 NOTE — Telephone Encounter (Signed)
 Received approval letter from [facility]  thru 12/31/2026approval letter index

## 2024-08-31 NOTE — Telephone Encounter (Signed)
 Received approval letter Lupie Needle thru 10/21/2025 left a HIPAA VM at pt number approval letter index.
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
## 2024-10-07 ENCOUNTER — Ambulatory Visit: Payer: Self-pay | Admitting: Urology

## 2024-10-07 ENCOUNTER — Encounter: Payer: Self-pay | Admitting: Urology

## 2024-10-07 VITALS — BP 156/87 | HR 63 | Ht 73.0 in | Wt 218.5 lb

## 2024-10-07 DIAGNOSIS — N401 Enlarged prostate with lower urinary tract symptoms: Secondary | ICD-10-CM | POA: Diagnosis not present

## 2024-10-07 DIAGNOSIS — N5201 Erectile dysfunction due to arterial insufficiency: Secondary | ICD-10-CM | POA: Diagnosis not present

## 2024-10-07 DIAGNOSIS — N138 Other obstructive and reflux uropathy: Secondary | ICD-10-CM

## 2024-10-07 DIAGNOSIS — R319 Hematuria, unspecified: Secondary | ICD-10-CM | POA: Diagnosis not present

## 2024-10-07 DIAGNOSIS — N3941 Urge incontinence: Secondary | ICD-10-CM

## 2024-10-07 LAB — BLADDER SCAN AMB NON-IMAGING

## 2024-10-07 MED ORDER — OXYBUTYNIN CHLORIDE ER 10 MG PO TB24: 10.0000 mg | ORAL_TABLET | Freq: Every day | ORAL | 0 refills | Status: AC

## 2024-10-07 MED ORDER — TADALAFIL 20 MG PO TABS: ORAL_TABLET | ORAL | 3 refills | Status: AC

## 2024-10-08 ENCOUNTER — Ambulatory Visit: Payer: Self-pay | Admitting: Urology

## 2024-10-08 ENCOUNTER — Other Ambulatory Visit: Payer: Self-pay

## 2024-10-08 DIAGNOSIS — N138 Other obstructive and reflux uropathy: Secondary | ICD-10-CM

## 2024-10-08 LAB — PSA: Prostate Specific Ag, Serum: 0.6 ng/mL (ref 0.0–4.0)

## 2024-10-08 LAB — TESTOSTERONE: Testosterone: 204 ng/dL — ABNORMAL LOW (ref 264–916)

## 2024-10-08 NOTE — Telephone Encounter (Signed)
-----   Message from [facility] sent at 10/08/2024  8:17 AM EST ----- Please let Richard Chen know that his testosterone  level was low and we need to repeat it again before 10 am for confirmation.  Would you get him scheduled for another testosterone  level before 10 am?

## 2024-10-08 NOTE — Telephone Encounter (Signed)
 Spoke with patient in regards to testosterone  levels being low. Patient was scheduled for repeat labs before am. Patient verbalized understanding of appointment date and time.  Andrea Kirks LPN

## 2024-10-19 ENCOUNTER — Other Ambulatory Visit

## 2024-10-19 DIAGNOSIS — N401 Enlarged prostate with lower urinary tract symptoms: Secondary | ICD-10-CM

## 2024-10-20 ENCOUNTER — Ambulatory Visit: Payer: Self-pay | Admitting: Urology

## 2024-10-20 LAB — TESTOSTERONE: Testosterone: 266 ng/dL (ref 264–916)

## 2024-10-21 ENCOUNTER — Other Ambulatory Visit: Payer: Self-pay

## 2024-10-21 DIAGNOSIS — N5201 Erectile dysfunction due to arterial insufficiency: Secondary | ICD-10-CM

## 2024-10-21 NOTE — Telephone Encounter (Signed)
 Spoke with patient in regards to Testosterone  levels decreasing, advised that provider would like to repeat labs at next visit to confirm. Patient verbalized understanding of results and appointment date and time.  Andrea Kirks LPN

## 2024-10-21 NOTE — Telephone Encounter (Signed)
-----   Message from [facility] sent at 10/20/2024 11:48 PM EST ----- Would you let Richard Chen know that his testosterone  level returned low and we will need one more morning testosterone  for conformation?  We can draw it at his appointment on 27th or we can schedule  an earlier lab appointment before 10 am.

## 2024-11-12 ENCOUNTER — Ambulatory Visit

## 2024-11-12 VITALS — BP 138/80 | HR 66 | Temp 98.0°F | Ht 73.0 in | Wt 221.0 lb

## 2024-11-12 DIAGNOSIS — N1832 Chronic kidney disease, stage 3b: Secondary | ICD-10-CM

## 2024-11-12 DIAGNOSIS — E7849 Other hyperlipidemia: Secondary | ICD-10-CM

## 2024-11-12 DIAGNOSIS — I10 Essential (primary) hypertension: Secondary | ICD-10-CM

## 2024-11-12 DIAGNOSIS — E119 Type 2 diabetes mellitus without complications: Secondary | ICD-10-CM

## 2024-11-12 LAB — HEMOGLOBIN A1C: Hgb A1c MFr Bld: 8.2 % — ABNORMAL HIGH (ref 4.6–6.5)

## 2024-11-12 LAB — LIPID PANEL
Cholesterol: 140 mg/dL (ref 28–200)
HDL: 59 mg/dL
LDL Cholesterol: 65 mg/dL (ref 10–99)
NonHDL: 81.4
Total CHOL/HDL Ratio: 2
Triglycerides: 81 mg/dL (ref 10.0–149.0)
VLDL: 16.2 mg/dL (ref 0.0–40.0)

## 2024-11-12 MED ORDER — LISINOPRIL 5 MG PO TABS: 5.0000 mg | ORAL_TABLET | Freq: Every day | ORAL | 0 refills | Status: AC

## 2024-11-12 NOTE — Patient Instructions (Signed)
 Thank you for visiting [facility] today! Here's what we talked about: - START Lisinopril , continue checking BP daily bring readings to next visit - Continue all other meds
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
## 2024-11-16 ENCOUNTER — Other Ambulatory Visit: Payer: Self-pay

## 2024-11-16 MED ORDER — DULAGLUTIDE 4.5 MG/0.5ML ~~LOC~~ SOAJ: 4.5000 mg | SUBCUTANEOUS | 4 refills | Status: AC

## 2024-11-16 NOTE — Telephone Encounter (Signed)
 Richard Chen

## 2024-11-17 ENCOUNTER — Telehealth: Payer: Self-pay | Admitting: Urology

## 2024-11-17 ENCOUNTER — Ambulatory Visit: Admitting: Urology

## 2024-11-17 DIAGNOSIS — N5201 Erectile dysfunction due to arterial insufficiency: Secondary | ICD-10-CM

## 2024-11-17 DIAGNOSIS — N138 Other obstructive and reflux uropathy: Secondary | ICD-10-CM

## 2024-11-17 NOTE — Telephone Encounter (Signed)
 We had to reschedule his appointment today because of the weather, will you call him and get him rescheduled?

## 2024-11-18 ENCOUNTER — Ambulatory Visit: Payer: Self-pay

## 2024-11-18 ENCOUNTER — Other Ambulatory Visit: Payer: Self-pay

## 2024-11-18 DIAGNOSIS — N401 Enlarged prostate with lower urinary tract symptoms: Secondary | ICD-10-CM
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
## 2024-11-18 NOTE — Addendum Note (Signed)
 Addended by: Derryl Uher A on: 11/18/2024 11:08 AM   Modules accepted: Orders

## 2024-11-19 ENCOUNTER — Encounter: Payer: Self-pay | Admitting: Urology

## 2024-11-19 ENCOUNTER — Ambulatory Visit: Admitting: Urology

## 2024-11-19 VITALS — BP 138/75 | HR 66 | Ht 73.0 in | Wt 217.2 lb

## 2024-11-19 DIAGNOSIS — R7989 Other specified abnormal findings of blood chemistry: Secondary | ICD-10-CM | POA: Diagnosis not present

## 2024-11-19 DIAGNOSIS — Z125 Encounter for screening for malignant neoplasm of prostate: Secondary | ICD-10-CM | POA: Diagnosis not present

## 2024-11-19 DIAGNOSIS — N401 Enlarged prostate with lower urinary tract symptoms: Secondary | ICD-10-CM | POA: Diagnosis not present

## 2024-11-19 DIAGNOSIS — N138 Other obstructive and reflux uropathy: Secondary | ICD-10-CM

## 2024-11-19 DIAGNOSIS — N5201 Erectile dysfunction due to arterial insufficiency: Secondary | ICD-10-CM

## 2024-11-19 LAB — BLADDER SCAN AMB NON-IMAGING

## 2024-11-19 NOTE — Progress Notes (Signed)
 Patient presents for an office visit. BP today is High. Greater than 140/90. Provider  notified and recheck Blood Pressure 138/75 back to normal Provider notified.

## 2024-11-20 ENCOUNTER — Ambulatory Visit: Payer: Self-pay | Admitting: Urology

## 2024-11-20 DIAGNOSIS — E291 Testicular hypofunction: Secondary | ICD-10-CM

## 2024-11-20 LAB — TESTOSTERONE: Testosterone: 220 ng/dL — ABNORMAL LOW (ref 264–916)

## 2024-11-20 MED ORDER — TESTOSTERONE 1.62 % TD GEL: 2.0000 | Freq: Every day | TRANSDERMAL | 3 refills | Status: AC

## 2024-11-25 ENCOUNTER — Telehealth: Payer: Self-pay

## 2024-11-25 NOTE — Telephone Encounter (Signed)
 Testosterone  PA initialed via cover my meds.

## 2024-12-10 ENCOUNTER — Other Ambulatory Visit

## 2024-12-17 ENCOUNTER — Other Ambulatory Visit

## 2024-12-17 ENCOUNTER — Ambulatory Visit: Admitting: Urology

## 2024-12-23 ENCOUNTER — Ambulatory Visit: Admitting: Urology

## 2024-12-24 ENCOUNTER — Ambulatory Visit

## 2024-12-29 ENCOUNTER — Encounter
